# Patient Record
Sex: Male | Born: 1940 | Race: White | Hispanic: No | Marital: Single | Smoking: Never smoker
Health system: Southern US, Community
[De-identification: ages and names within clinical notes are randomized; demographics above are authoritative.]

## PROBLEM LIST (undated history)

## (undated) DIAGNOSIS — K631 Perforation of intestine (nontraumatic): Secondary | ICD-10-CM

## (undated) DIAGNOSIS — G500 Trigeminal neuralgia: Secondary | ICD-10-CM

## (undated) DIAGNOSIS — K259 Gastric ulcer, unspecified as acute or chronic, without hemorrhage or perforation: Secondary | ICD-10-CM

## (undated) DIAGNOSIS — R51 Headache: Secondary | ICD-10-CM

## (undated) DIAGNOSIS — I10 Essential (primary) hypertension: Secondary | ICD-10-CM

## (undated) DIAGNOSIS — C801 Malignant (primary) neoplasm, unspecified: Secondary | ICD-10-CM

## (undated) DIAGNOSIS — K810 Acute cholecystitis: Secondary | ICD-10-CM

## (undated) DIAGNOSIS — M1990 Unspecified osteoarthritis, unspecified site: Secondary | ICD-10-CM

## (undated) DIAGNOSIS — R519 Headache, unspecified: Secondary | ICD-10-CM

## (undated) DIAGNOSIS — I252 Old myocardial infarction: Secondary | ICD-10-CM

## (undated) DIAGNOSIS — E119 Type 2 diabetes mellitus without complications: Secondary | ICD-10-CM

## (undated) DIAGNOSIS — N183 Chronic kidney disease, stage 3 (moderate): Secondary | ICD-10-CM

## (undated) HISTORY — DX: Trigeminal neuralgia: G50.0

## (undated) HISTORY — DX: Perforation of intestine (nontraumatic): K63.1

## (undated) HISTORY — DX: Essential (primary) hypertension: I10

## (undated) HISTORY — DX: Chronic kidney disease, stage 3 (moderate): N18.3

## (undated) HISTORY — DX: Gastric ulcer, unspecified as acute or chronic, without hemorrhage or perforation: K25.9

## (undated) HISTORY — DX: Acute cholecystitis: K81.0

---

## 2006-08-08 ENCOUNTER — Ambulatory Visit: Payer: Self-pay | Admitting: Orthopaedic Surgery

## 2006-08-11 ENCOUNTER — Inpatient Hospital Stay: Payer: Self-pay | Admitting: Orthopaedic Surgery

## 2007-04-23 ENCOUNTER — Ambulatory Visit: Payer: Self-pay | Admitting: Orthopaedic Surgery

## 2007-04-30 ENCOUNTER — Ambulatory Visit: Payer: Self-pay | Admitting: Orthopaedic Surgery

## 2008-11-11 ENCOUNTER — Ambulatory Visit: Payer: Self-pay | Admitting: Otolaryngology

## 2009-01-11 ENCOUNTER — Ambulatory Visit: Payer: Self-pay | Admitting: Ophthalmology

## 2009-01-17 ENCOUNTER — Ambulatory Visit: Payer: Self-pay | Admitting: Ophthalmology

## 2009-03-21 ENCOUNTER — Ambulatory Visit: Payer: Self-pay | Admitting: Ophthalmology

## 2009-09-27 ENCOUNTER — Ambulatory Visit: Payer: Self-pay | Admitting: Internal Medicine

## 2010-01-31 ENCOUNTER — Observation Stay: Payer: Self-pay | Admitting: Internal Medicine

## 2010-02-07 ENCOUNTER — Encounter
Admission: RE | Admit: 2010-02-07 | Discharge: 2010-02-07 | Payer: Self-pay | Source: Home / Self Care | Attending: *Deleted | Admitting: *Deleted

## 2011-03-12 ENCOUNTER — Ambulatory Visit: Payer: Self-pay | Admitting: General Practice

## 2011-03-12 LAB — PROTIME-INR: INR: 1

## 2011-03-12 LAB — BASIC METABOLIC PANEL
Anion Gap: 10 (ref 7–16)
Calcium, Total: 8.6 mg/dL (ref 8.5–10.1)
Chloride: 104 mmol/L (ref 98–107)
Co2: 30 mmol/L (ref 21–32)
EGFR (African American): >60

## 2011-03-12 LAB — URINALYSIS, COMPLETE
Blood: NEGATIVE
Nitrite: NEGATIVE
Ph: 5 (ref 4.5–8.0)
Protein: NEGATIVE
Specific Gravity: 1.024 (ref 1.003–1.030)

## 2011-03-12 LAB — CBC
MCH: 29.5 pg (ref 26.0–34.0)
MCHC: 32.6 g/dL (ref 32.0–36.0)
Platelet: 193 10*3/uL (ref 150–440)

## 2011-03-12 LAB — MRSA PCR SCREENING

## 2011-03-12 LAB — APTT: Activated PTT: 34.2 secs (ref 23.6–35.9)

## 2011-03-12 LAB — SEDIMENTATION RATE: Erythrocyte Sed Rate: 11 mm/hr (ref 0–20)

## 2011-03-20 ENCOUNTER — Ambulatory Visit: Payer: Self-pay | Admitting: Cardiology

## 2011-03-25 ENCOUNTER — Inpatient Hospital Stay: Payer: Self-pay | Admitting: General Practice

## 2011-03-26 LAB — BASIC METABOLIC PANEL
Anion Gap: 11 (ref 7–16)
BUN: 17 mg/dL (ref 7–18)
Chloride: 105 mmol/L (ref 98–107)
EGFR (Non-African Amer.): 44 — ABNORMAL LOW
Glucose: 144 mg/dL — ABNORMAL HIGH (ref 65–99)
Osmolality: 282 (ref 275–301)

## 2011-03-26 LAB — HEMOGLOBIN: HGB: 11 g/dL — ABNORMAL LOW (ref 13.0–18.0)

## 2011-03-27 LAB — BASIC METABOLIC PANEL
Anion Gap: 10 (ref 7–16)
BUN: 24 mg/dL — ABNORMAL HIGH (ref 7–18)
Chloride: 103 mmol/L (ref 98–107)
Co2: 24 mmol/L (ref 21–32)
Creatinine: 1.75 mg/dL — ABNORMAL HIGH (ref 0.60–1.30)
Glucose: 116 mg/dL — ABNORMAL HIGH (ref 65–99)
Potassium: 4.2 mmol/L (ref 3.5–5.1)
Sodium: 137 mmol/L (ref 136–145)

## 2011-03-27 LAB — PLATELET COUNT: Platelet: 148 10*3/uL — ABNORMAL LOW (ref 150–440)

## 2011-04-15 ENCOUNTER — Emergency Department: Payer: Self-pay | Admitting: Emergency Medicine

## 2011-04-15 LAB — COMPREHENSIVE METABOLIC PANEL
Anion Gap: 16 (ref 7–16)
Calcium, Total: 9 mg/dL (ref 8.5–10.1)
Creatinine: 1.91 mg/dL — ABNORMAL HIGH (ref 0.60–1.30)
EGFR (African American): 45 — ABNORMAL LOW
EGFR (Non-African Amer.): 37 — ABNORMAL LOW
Glucose: 131 mg/dL — ABNORMAL HIGH (ref 65–99)
Potassium: 4.4 mmol/L (ref 3.5–5.1)
SGOT(AST): 19 U/L (ref 15–37)
SGPT (ALT): 18 U/L
Sodium: 137 mmol/L (ref 136–145)
Total Protein: 7.7 g/dL (ref 6.4–8.2)

## 2011-04-15 LAB — URINALYSIS, COMPLETE
Bilirubin,UR: NEGATIVE
Glucose,UR: NEGATIVE mg/dL (ref 0–75)
Leukocyte Esterase: NEGATIVE
Nitrite: NEGATIVE
Squamous Epithelial: NONE SEEN

## 2011-04-15 LAB — CBC
MCV: 89 fL (ref 80–100)
Platelet: 288 10*3/uL (ref 150–440)
RBC: 4.14 10*6/uL — ABNORMAL LOW (ref 4.40–5.90)
RDW: 14 % (ref 11.5–14.5)
WBC: 9.6 10*3/uL (ref 3.8–10.6)

## 2011-04-15 LAB — TROPONIN I: Troponin-I: 0.03 ng/mL

## 2011-04-23 ENCOUNTER — Emergency Department: Payer: Self-pay | Admitting: Emergency Medicine

## 2011-04-23 LAB — CBC
HCT: 36.7 % — ABNORMAL LOW (ref 40.0–52.0)
HGB: 12.1 g/dL — ABNORMAL LOW (ref 13.0–18.0)
MCHC: 33 g/dL (ref 32.0–36.0)
WBC: 8.4 10*3/uL (ref 3.8–10.6)

## 2011-04-23 LAB — COMPREHENSIVE METABOLIC PANEL
Anion Gap: 10 (ref 7–16)
Bilirubin,Total: 1.1 mg/dL — ABNORMAL HIGH (ref 0.2–1.0)
Co2: 26 mmol/L (ref 21–32)
EGFR (African American): 44 — ABNORMAL LOW
EGFR (Non-African Amer.): 37 — ABNORMAL LOW
Glucose: 126 mg/dL — ABNORMAL HIGH (ref 65–99)
Potassium: 4.2 mmol/L (ref 3.5–5.1)
SGOT(AST): 22 U/L (ref 15–37)
SGPT (ALT): 27 U/L
Total Protein: 7.5 g/dL (ref 6.4–8.2)

## 2011-05-11 ENCOUNTER — Ambulatory Visit: Payer: Self-pay | Admitting: General Practice

## 2011-11-06 ENCOUNTER — Ambulatory Visit: Payer: Self-pay | Admitting: Neurology

## 2013-07-30 DIAGNOSIS — B351 Tinea unguium: Secondary | ICD-10-CM | POA: Insufficient documentation

## 2014-02-15 DIAGNOSIS — N1831 Chronic kidney disease, stage 3a: Secondary | ICD-10-CM | POA: Insufficient documentation

## 2014-02-15 DIAGNOSIS — N183 Chronic kidney disease, stage 3 unspecified: Secondary | ICD-10-CM | POA: Insufficient documentation

## 2014-02-15 HISTORY — DX: Chronic kidney disease, stage 3 unspecified: N18.30

## 2014-05-18 ENCOUNTER — Inpatient Hospital Stay
Admit: 2014-05-18 | Disposition: A | Discharge status: Skilled Nursing Facility | Payer: Self-pay | Attending: Internal Medicine | Admitting: Internal Medicine

## 2014-05-19 LAB — CBC WITH DIFFERENTIAL/PLATELET
BASOS PCT: 0.1 %
BASOS PCT: 0.2 %
Basophil #: 0 10*3/uL (ref 0.0–0.1)
Basophil #: 0 10*3/uL (ref 0.0–0.1)
EOS ABS: 0 10*3/uL (ref 0.0–0.7)
EOS PCT: 0 %
EOS PCT: 0 %
Eosinophil #: 0 10*3/uL (ref 0.0–0.7)
HCT: 37.7 % — ABNORMAL LOW (ref 40.0–52.0)
HCT: 38.2 % — AB (ref 40.0–52.0)
HGB: 12 g/dL — AB (ref 13.0–18.0)
HGB: 12.7 g/dL — AB (ref 13.0–18.0)
LYMPHS PCT: 4.2 %
LYMPHS PCT: 1.3 %
Lymphocyte #: 0.8 10*3/uL — ABNORMAL LOW (ref 1.0–3.6)
Lymphocyte #: 0.3 10*3/uL — ABNORMAL LOW (ref 1.0–3.6)
MCH: 30.7 pg (ref 26.0–34.0)
MCH: 31.6 pg (ref 26.0–34.0)
MCHC: 32 g/dL (ref 32.0–36.0)
MCHC: 33.3 g/dL (ref 32.0–36.0)
MCV: 96 fL (ref 80–100)
MCV: 95 fL (ref 80–100)
MONO ABS: 1 x10 3/mm (ref 0.2–1.0)
MONOS PCT: 5.4 %
Monocyte #: 0.7 x10 3/mm (ref 0.2–1.0)
Monocyte %: 3 %
NEUTROS ABS: 22.4 10*3/uL — AB (ref 1.4–6.5)
NEUTROS PCT: 90.3 %
NEUTROS PCT: 95.5 %
Neutrophil #: 16.4 10*3/uL — ABNORMAL HIGH (ref 1.4–6.5)
Platelet: 194 10*3/uL (ref 150–440)
Platelet: 187 10*3/uL (ref 150–440)
RBC: 3.92 10*6/uL — AB (ref 4.40–5.90)
RBC: 4.02 10*6/uL — AB (ref 4.40–5.90)
RDW: 12.8 % (ref 11.5–14.5)
RDW: 13 % (ref 11.5–14.5)
WBC: 18.2 10*3/uL — AB (ref 3.8–10.6)
WBC: 23.5 10*3/uL — ABNORMAL HIGH (ref 3.8–10.6)

## 2014-05-19 LAB — PROTIME-INR
INR: 1.5
PROTHROMBIN TIME: 18 s — AB

## 2014-05-19 LAB — COMPREHENSIVE METABOLIC PANEL
ALBUMIN: 2.7 g/dL — AB
ALK PHOS: 61 U/L
ALT: 12 U/L — AB
AST: 20 U/L
Anion Gap: 5 — ABNORMAL LOW (ref 7–16)
BUN: 17 mg/dL
Bilirubin,Total: 1.6 mg/dL — ABNORMAL HIGH
CREATININE: 1.52 mg/dL — AB
Calcium, Total: 7.8 mg/dL — ABNORMAL LOW
Chloride: 108 mmol/L
Co2: 24 mmol/L
EGFR (African American): 51 — ABNORMAL LOW
GFR CALC NON AF AMER: 44 — AB
GLUCOSE: 154 mg/dL — AB
Potassium: 4.5 mmol/L
SODIUM: 137 mmol/L
TOTAL PROTEIN: 5.8 g/dL — AB

## 2014-05-19 LAB — LIPASE, BLOOD: LIPASE: 17 U/L — AB

## 2014-05-20 LAB — HEPATIC FUNCTION PANEL A (ARMC)
ALK PHOS: 76 U/L
ALT: 13 U/L — AB
AST: 17 U/L
Albumin: 2.7 g/dL — ABNORMAL LOW
BILIRUBIN TOTAL: 1.3 mg/dL — AB
Bilirubin, Direct: 0.5 mg/dL
Indirect Bilirubin: 0.8
Total Protein: 6 g/dL — ABNORMAL LOW

## 2014-05-20 LAB — PROTIME-INR
INR: 1.5
PROTHROMBIN TIME: 18.7 s — AB

## 2014-05-20 LAB — WBC: WBC: 23.3 10*3/uL — ABNORMAL HIGH (ref 3.8–10.6)

## 2014-05-21 DIAGNOSIS — K810 Acute cholecystitis: Secondary | ICD-10-CM

## 2014-05-21 HISTORY — PX: LAPAROSCOPIC CHOLECYSTECTOMY: SUR755

## 2014-05-21 HISTORY — DX: Acute cholecystitis: K81.0

## 2014-05-21 LAB — CBC WITH DIFFERENTIAL/PLATELET
Basophil #: 0 10*3/uL (ref 0.0–0.1)
Basophil #: 0 10*3/uL (ref 0.0–0.1)
Basophil %: 0.1 %
Basophil %: 0.2 %
EOS PCT: 0 %
EOS PCT: 0 %
Eosinophil #: 0 10*3/uL (ref 0.0–0.7)
Eosinophil #: 0 10*3/uL (ref 0.0–0.7)
HCT: 34.7 % — ABNORMAL LOW (ref 40.0–52.0)
HCT: 32.4 % — AB (ref 40.0–52.0)
HGB: 11.5 g/dL — ABNORMAL LOW (ref 13.0–18.0)
HGB: 10.4 g/dL — ABNORMAL LOW (ref 13.0–18.0)
LYMPHS ABS: 0.2 10*3/uL — AB (ref 1.0–3.6)
LYMPHS PCT: 2.6 %
Lymphocyte #: 0.5 10*3/uL — ABNORMAL LOW (ref 1.0–3.6)
Lymphocyte %: 1.3 %
MCH: 31.9 pg (ref 26.0–34.0)
MCH: 31 pg (ref 26.0–34.0)
MCHC: 33.2 g/dL (ref 32.0–36.0)
MCHC: 32.2 g/dL (ref 32.0–36.0)
MCV: 96 fL (ref 80–100)
MCV: 96 fL (ref 80–100)
MONOS PCT: 2.8 %
MONOS PCT: 3.4 %
Monocyte #: 0.5 x10 3/mm (ref 0.2–1.0)
Monocyte #: 0.5 x10 3/mm (ref 0.2–1.0)
NEUTROS ABS: 13.7 10*3/uL — AB (ref 1.4–6.5)
NEUTROS PCT: 94.5 %
Neutrophil #: 17.5 10*3/uL — ABNORMAL HIGH (ref 1.4–6.5)
Neutrophil %: 95.1 %
PLATELETS: 175 10*3/uL (ref 150–440)
Platelet: 178 10*3/uL (ref 150–440)
RBC: 3.61 10*6/uL — AB (ref 4.40–5.90)
RBC: 3.36 10*6/uL — AB (ref 4.40–5.90)
RDW: 13.6 % (ref 11.5–14.5)
RDW: 13.8 % (ref 11.5–14.5)
WBC: 18.6 10*3/uL — AB (ref 3.8–10.6)
WBC: 14.4 10*3/uL — AB (ref 3.8–10.6)

## 2014-05-21 LAB — BASIC METABOLIC PANEL
Anion Gap: 4 — ABNORMAL LOW (ref 7–16)
Anion Gap: 6 — ABNORMAL LOW (ref 7–16)
BUN: 27 mg/dL — AB
BUN: 29 mg/dL — AB
CHLORIDE: 111 mmol/L
CO2: 23 mmol/L
CO2: 22 mmol/L
CREATININE: 1.69 mg/dL — AB
CREATININE: 1.76 mg/dL — AB
Calcium, Total: 7.7 mg/dL — ABNORMAL LOW
Calcium, Total: 7.5 mg/dL — ABNORMAL LOW
Chloride: 111 mmol/L
EGFR (African American): 43 — ABNORMAL LOW
EGFR (Non-African Amer.): 37 — ABNORMAL LOW
GFR CALC AF AMER: 45 — AB
GFR CALC NON AF AMER: 39 — AB
Glucose: 141 mg/dL — ABNORMAL HIGH
Glucose: 180 mg/dL — ABNORMAL HIGH
POTASSIUM: 4.2 mmol/L
POTASSIUM: 4.5 mmol/L
SODIUM: 139 mmol/L
Sodium: 138 mmol/L

## 2014-05-21 LAB — PROTIME-INR
INR: 1.4
INR: 1.3
PROTHROMBIN TIME: 16.7 s — AB
Prothrombin Time: 16.9 secs — ABNORMAL HIGH

## 2014-05-22 LAB — BASIC METABOLIC PANEL
ANION GAP: 8 (ref 7–16)
BUN: 29 mg/dL — ABNORMAL HIGH
Calcium, Total: 7.4 mg/dL — ABNORMAL LOW
Chloride: 110 mmol/L
Co2: 22 mmol/L
Creatinine: 1.59 mg/dL — ABNORMAL HIGH
EGFR (African American): 49 — ABNORMAL LOW
EGFR (Non-African Amer.): 42 — ABNORMAL LOW
Glucose: 220 mg/dL — ABNORMAL HIGH
Potassium: 3.2 mmol/L — ABNORMAL LOW
Sodium: 140 mmol/L

## 2014-05-22 LAB — CBC WITH DIFFERENTIAL/PLATELET
BASOS PCT: 0.2 %
Basophil #: 0 10*3/uL (ref 0.0–0.1)
EOS ABS: 0 10*3/uL (ref 0.0–0.7)
EOS PCT: 0 %
HCT: 31.1 % — AB (ref 40.0–52.0)
HGB: 10 g/dL — ABNORMAL LOW (ref 13.0–18.0)
Lymphocyte #: 0.3 10*3/uL — ABNORMAL LOW (ref 1.0–3.6)
Lymphocyte %: 2.6 %
MCH: 30.7 pg (ref 26.0–34.0)
MCHC: 32 g/dL (ref 32.0–36.0)
MCV: 96 fL (ref 80–100)
MONOS PCT: 4.5 %
Monocyte #: 0.6 x10 3/mm (ref 0.2–1.0)
NEUTROS ABS: 11.7 10*3/uL — AB (ref 1.4–6.5)
NEUTROS PCT: 92.7 %
Platelet: 168 10*3/uL (ref 150–440)
RBC: 3.24 10*6/uL — AB (ref 4.40–5.90)
RDW: 13.5 % (ref 11.5–14.5)
WBC: 12.7 10*3/uL — AB (ref 3.8–10.6)

## 2014-05-22 LAB — POTASSIUM: Potassium: 3.4 mmol/L — ABNORMAL LOW

## 2014-05-22 LAB — MAGNESIUM
MAGNESIUM: 1.4 mg/dL — AB
Magnesium: 1.9 mg/dL

## 2014-05-22 LAB — TROPONIN I: TROPONIN-I: 0.05 ng/mL — AB

## 2014-05-23 LAB — BASIC METABOLIC PANEL
Anion Gap: 4 — ABNORMAL LOW (ref 7–16)
BUN: 23 mg/dL — AB
CHLORIDE: 112 mmol/L — AB
CREATININE: 1.34 mg/dL — AB
Calcium, Total: 7.2 mg/dL — ABNORMAL LOW
Co2: 23 mmol/L
EGFR (African American): 60 — ABNORMAL LOW
EGFR (Non-African Amer.): 51 — ABNORMAL LOW
Glucose: 196 mg/dL — ABNORMAL HIGH
POTASSIUM: 3.8 mmol/L
Sodium: 139 mmol/L

## 2014-05-23 LAB — MAGNESIUM: Magnesium: 1.8 mg/dL

## 2014-05-23 LAB — CBC WITH DIFFERENTIAL/PLATELET
BASOS PCT: 0.3 %
Basophil #: 0 10*3/uL (ref 0.0–0.1)
Eosinophil #: 0.1 10*3/uL (ref 0.0–0.7)
Eosinophil %: 0.8 %
HCT: 30.9 % — AB (ref 40.0–52.0)
HGB: 10.2 g/dL — AB (ref 13.0–18.0)
LYMPHS PCT: 3.2 %
Lymphocyte #: 0.5 10*3/uL — ABNORMAL LOW (ref 1.0–3.6)
MCH: 31.2 pg (ref 26.0–34.0)
MCHC: 32.9 g/dL (ref 32.0–36.0)
MCV: 95 fL (ref 80–100)
Monocyte #: 0.7 x10 3/mm (ref 0.2–1.0)
Monocyte %: 4.9 %
NEUTROS ABS: 13.3 10*3/uL — AB (ref 1.4–6.5)
Neutrophil %: 90.8 %
Platelet: 178 10*3/uL (ref 150–440)
RBC: 3.26 10*6/uL — ABNORMAL LOW (ref 4.40–5.90)
RDW: 13.5 % (ref 11.5–14.5)
WBC: 14.6 10*3/uL — ABNORMAL HIGH (ref 3.8–10.6)

## 2014-05-23 LAB — T4, FREE: Free Thyroxine: 0.92 ng/dL

## 2014-05-23 LAB — PHOSPHORUS: Phosphorus: 1.9 mg/dL — ABNORMAL LOW

## 2014-05-23 LAB — TRIGLYCERIDES: TRIGLYCERIDES: 255 mg/dL — AB

## 2014-05-24 LAB — CBC WITH DIFFERENTIAL/PLATELET
BASOS ABS: 0 10*3/uL (ref 0.0–0.1)
Basophil %: 0.2 %
Eosinophil #: 0.1 10*3/uL (ref 0.0–0.7)
Eosinophil %: 0.4 %
HCT: 31.3 % — AB (ref 40.0–52.0)
HGB: 10.6 g/dL — ABNORMAL LOW (ref 13.0–18.0)
LYMPHS PCT: 3 %
Lymphocyte #: 0.5 10*3/uL — ABNORMAL LOW (ref 1.0–3.6)
MCH: 31.7 pg (ref 26.0–34.0)
MCHC: 33.7 g/dL (ref 32.0–36.0)
MCV: 94 fL (ref 80–100)
Monocyte #: 0.9 x10 3/mm (ref 0.2–1.0)
Monocyte %: 5.2 %
NEUTROS ABS: 15.4 10*3/uL — AB (ref 1.4–6.5)
Neutrophil %: 91.2 %
PLATELETS: 212 10*3/uL (ref 150–440)
RBC: 3.33 10*6/uL — ABNORMAL LOW (ref 4.40–5.90)
RDW: 13.8 % (ref 11.5–14.5)
WBC: 16.8 10*3/uL — AB (ref 3.8–10.6)

## 2014-05-24 LAB — HEPATIC FUNCTION PANEL A (ARMC)
ALT: 29 U/L
Albumin: 1.8 g/dL — ABNORMAL LOW
Alkaline Phosphatase: 79 U/L
BILIRUBIN TOTAL: 1 mg/dL
Bilirubin, Direct: 0.6 mg/dL — ABNORMAL HIGH
Indirect Bilirubin: 0.4
SGOT(AST): 23 U/L
Total Protein: 5.7 g/dL — ABNORMAL LOW

## 2014-05-24 LAB — BASIC METABOLIC PANEL
Anion Gap: 9 (ref 7–16)
BUN: 20 mg/dL
CREATININE: 1.21 mg/dL
Calcium, Total: 7.7 mg/dL — ABNORMAL LOW
Chloride: 111 mmol/L
Co2: 21 mmol/L — ABNORMAL LOW
EGFR (African American): >60
EGFR (Non-African Amer.): 58 — ABNORMAL LOW
Glucose: 171 mg/dL — ABNORMAL HIGH
POTASSIUM: 4.4 mmol/L
SODIUM: 141 mmol/L

## 2014-05-24 LAB — PHOSPHORUS: Phosphorus: 3.3 mg/dL

## 2014-05-24 LAB — AMMONIA: Ammonia, Plasma: 14 umol/L

## 2014-05-24 LAB — MAGNESIUM: Magnesium: 1.8 mg/dL

## 2014-05-24 LAB — LACTIC ACID, PLASMA: LACTIC ACID, VENOUS: 1.4 mmol/L

## 2014-05-25 LAB — CBC WITH DIFFERENTIAL/PLATELET
BASOS ABS: 0 10*3/uL (ref 0.0–0.1)
Basophil %: 0.2 %
EOS ABS: 0.1 10*3/uL (ref 0.0–0.7)
Eosinophil %: 0.6 %
HCT: 32.1 % — AB (ref 40.0–52.0)
HGB: 10.5 g/dL — AB (ref 13.0–18.0)
LYMPHS ABS: 0.5 10*3/uL — AB (ref 1.0–3.6)
LYMPHS PCT: 3.4 %
MCH: 30.8 pg (ref 26.0–34.0)
MCHC: 32.7 g/dL (ref 32.0–36.0)
MCV: 94 fL (ref 80–100)
MONO ABS: 0.9 x10 3/mm (ref 0.2–1.0)
MONOS PCT: 6.2 %
Neutrophil #: 13.2 10*3/uL — ABNORMAL HIGH (ref 1.4–6.5)
Neutrophil %: 89.6 %
Platelet: 222 10*3/uL (ref 150–440)
RBC: 3.4 10*6/uL — ABNORMAL LOW (ref 4.40–5.90)
RDW: 13.7 % (ref 11.5–14.5)
WBC: 14.7 10*3/uL — ABNORMAL HIGH (ref 3.8–10.6)

## 2014-05-26 LAB — CBC WITH DIFFERENTIAL/PLATELET
Basophil #: 0 10*3/uL (ref 0.0–0.1)
Basophil %: 0.2 %
EOS ABS: 0.1 10*3/uL (ref 0.0–0.7)
Eosinophil %: 0.7 %
HCT: 31.4 % — ABNORMAL LOW (ref 40.0–52.0)
HGB: 10.1 g/dL — ABNORMAL LOW (ref 13.0–18.0)
LYMPHS ABS: 0.6 10*3/uL — AB (ref 1.0–3.6)
Lymphocyte %: 4.7 %
MCH: 30.4 pg (ref 26.0–34.0)
MCHC: 32.1 g/dL (ref 32.0–36.0)
MCV: 95 fL (ref 80–100)
MONO ABS: 0.9 x10 3/mm (ref 0.2–1.0)
Monocyte %: 6.5 %
NEUTROS PCT: 87.9 %
Neutrophil #: 11.8 10*3/uL — ABNORMAL HIGH (ref 1.4–6.5)
PLATELETS: 221 10*3/uL (ref 150–440)
RBC: 3.31 10*6/uL — ABNORMAL LOW (ref 4.40–5.90)
RDW: 14.1 % (ref 11.5–14.5)
WBC: 13.5 10*3/uL — ABNORMAL HIGH (ref 3.8–10.6)

## 2014-05-27 LAB — BASIC METABOLIC PANEL
ANION GAP: 8 (ref 7–16)
BUN: 26 mg/dL — ABNORMAL HIGH
CALCIUM: 7.9 mg/dL — AB
CO2: 27 mmol/L
Chloride: 107 mmol/L
Creatinine: 1.07 mg/dL
EGFR (African American): >60
EGFR (Non-African Amer.): >60
Glucose: 318 mg/dL — ABNORMAL HIGH
Potassium: 4.1 mmol/L
Sodium: 142 mmol/L

## 2014-05-27 LAB — MAGNESIUM: Magnesium: 1.7 mg/dL

## 2014-05-27 LAB — CBC WITH DIFFERENTIAL/PLATELET
BASOS ABS: 0 10*3/uL (ref 0.0–0.1)
Basophil %: 0.1 %
EOS ABS: 0.2 10*3/uL (ref 0.0–0.7)
Eosinophil %: 1.1 %
HCT: 30.3 % — ABNORMAL LOW (ref 40.0–52.0)
HGB: 9.8 g/dL — ABNORMAL LOW (ref 13.0–18.0)
LYMPHS ABS: 0.6 10*3/uL — AB (ref 1.0–3.6)
Lymphocyte %: 4.2 %
MCH: 30.8 pg (ref 26.0–34.0)
MCHC: 32.4 g/dL (ref 32.0–36.0)
MCV: 95 fL (ref 80–100)
Monocyte #: 0.8 x10 3/mm (ref 0.2–1.0)
Monocyte %: 5.8 %
NEUTROS PCT: 88.8 %
Neutrophil #: 12.4 10*3/uL — ABNORMAL HIGH (ref 1.4–6.5)
Platelet: 220 10*3/uL (ref 150–440)
RBC: 3.19 10*6/uL — ABNORMAL LOW (ref 4.40–5.90)
RDW: 14.3 % (ref 11.5–14.5)
WBC: 14 10*3/uL — ABNORMAL HIGH (ref 3.8–10.6)

## 2014-05-27 LAB — PHOSPHORUS: Phosphorus: 2.6 mg/dL

## 2014-05-28 DIAGNOSIS — K631 Perforation of intestine (nontraumatic): Secondary | ICD-10-CM

## 2014-05-28 HISTORY — PX: EXPLORATORY LAPAROTOMY: SUR591

## 2014-05-28 HISTORY — DX: Perforation of intestine (nontraumatic): K63.1

## 2014-05-28 HISTORY — PX: COLOSTOMY: SHX63

## 2014-05-28 LAB — BASIC METABOLIC PANEL
Anion Gap: 2 — ABNORMAL LOW (ref 7–16)
BUN: 28 mg/dL — ABNORMAL HIGH
CHLORIDE: 109 mmol/L
Calcium, Total: 7.6 mg/dL — ABNORMAL LOW
Co2: 28 mmol/L
Creatinine: 1.11 mg/dL
Glucose: 353 mg/dL — ABNORMAL HIGH
Potassium: 3.8 mmol/L
Sodium: 139 mmol/L

## 2014-05-28 LAB — CBC WITH DIFFERENTIAL/PLATELET
BASOS PCT: 0.2 %
Basophil #: 0 10*3/uL (ref 0.0–0.1)
Eosinophil #: 0.2 10*3/uL (ref 0.0–0.7)
Eosinophil %: 1.4 %
HCT: 28.5 % — ABNORMAL LOW (ref 40.0–52.0)
HGB: 9.3 g/dL — ABNORMAL LOW (ref 13.0–18.0)
Lymphocyte #: 0.7 10*3/uL — ABNORMAL LOW (ref 1.0–3.6)
Lymphocyte %: 4.5 %
MCH: 31.1 pg (ref 26.0–34.0)
MCHC: 32.7 g/dL (ref 32.0–36.0)
MCV: 95 fL (ref 80–100)
MONOS PCT: 5.6 %
Monocyte #: 0.8 x10 3/mm (ref 0.2–1.0)
NEUTROS ABS: 13.4 10*3/uL — AB (ref 1.4–6.5)
Neutrophil %: 88.3 %
Platelet: 210 10*3/uL (ref 150–440)
RBC: 3 10*6/uL — ABNORMAL LOW (ref 4.40–5.90)
RDW: 14.4 % (ref 11.5–14.5)
WBC: 15.2 10*3/uL — AB (ref 3.8–10.6)

## 2014-05-28 LAB — PHOSPHORUS: PHOSPHORUS: 2.4 mg/dL — AB

## 2014-05-28 LAB — MAGNESIUM: MAGNESIUM: 1.6 mg/dL — AB

## 2014-05-29 LAB — CBC WITH DIFFERENTIAL/PLATELET
BASOS ABS: 0 10*3/uL (ref 0.0–0.1)
Basophil %: 0.1 %
EOS ABS: 0 10*3/uL (ref 0.0–0.7)
Eosinophil %: 0 %
HCT: 30.7 % — AB (ref 40.0–52.0)
HGB: 9.9 g/dL — ABNORMAL LOW (ref 13.0–18.0)
LYMPHS ABS: 0.6 10*3/uL — AB (ref 1.0–3.6)
Lymphocyte %: 2.9 %
MCH: 31 pg (ref 26.0–34.0)
MCHC: 32.4 g/dL (ref 32.0–36.0)
MCV: 96 fL (ref 80–100)
Monocyte #: 0.6 x10 3/mm (ref 0.2–1.0)
Monocyte %: 2.7 %
NEUTROS ABS: 21 10*3/uL — AB (ref 1.4–6.5)
Neutrophil %: 94.3 %
Platelet: 221 10*3/uL (ref 150–440)
RBC: 3.21 10*6/uL — ABNORMAL LOW (ref 4.40–5.90)
RDW: 14.6 % — ABNORMAL HIGH (ref 11.5–14.5)
WBC: 22.3 10*3/uL — ABNORMAL HIGH (ref 3.8–10.6)

## 2014-05-29 LAB — BASIC METABOLIC PANEL
Anion Gap: 2 — ABNORMAL LOW (ref 7–16)
BUN: 31 mg/dL — ABNORMAL HIGH
CALCIUM: 6.8 mg/dL — AB
CO2: 24 mmol/L
Chloride: 108 mmol/L
Creatinine: 1.17 mg/dL
EGFR (African American): >60
Glucose: 504 mg/dL
Potassium: 4.1 mmol/L
Sodium: 134 mmol/L — ABNORMAL LOW

## 2014-05-29 LAB — PHOSPHORUS: Phosphorus: 2.8 mg/dL

## 2014-05-29 LAB — ALBUMIN: Albumin: 1.2 g/dL — ABNORMAL LOW

## 2014-05-29 LAB — CULTURE, BLOOD (SINGLE)

## 2014-05-29 LAB — MAGNESIUM: Magnesium: 1.6 mg/dL — ABNORMAL LOW

## 2014-05-30 LAB — MAGNESIUM: Magnesium: 1.7 mg/dL

## 2014-05-30 LAB — CBC WITH DIFFERENTIAL/PLATELET
BASOS PCT: 0.3 %
Basophil #: 0.1 10*3/uL (ref 0.0–0.1)
EOS PCT: 0.3 %
Eosinophil #: 0.1 10*3/uL (ref 0.0–0.7)
HCT: 29.8 % — ABNORMAL LOW (ref 40.0–52.0)
HGB: 9.5 g/dL — AB (ref 13.0–18.0)
Lymphocyte #: 0.8 10*3/uL — ABNORMAL LOW (ref 1.0–3.6)
Lymphocyte %: 2.2 %
MCH: 30.3 pg (ref 26.0–34.0)
MCHC: 32 g/dL (ref 32.0–36.0)
MCV: 95 fL (ref 80–100)
MONO ABS: 1.1 x10 3/mm — AB (ref 0.2–1.0)
Monocyte %: 2.9 %
NEUTROS ABS: 34.5 10*3/uL — AB (ref 1.4–6.5)
Neutrophil %: 94.3 %
PLATELETS: 348 10*3/uL (ref 150–440)
RBC: 3.15 10*6/uL — ABNORMAL LOW (ref 4.40–5.90)
RDW: 14.6 % — ABNORMAL HIGH (ref 11.5–14.5)
WBC: 36.6 10*3/uL — ABNORMAL HIGH (ref 3.8–10.6)

## 2014-05-30 LAB — BASIC METABOLIC PANEL
ANION GAP: 6 — AB (ref 7–16)
BUN: 36 mg/dL — ABNORMAL HIGH
CALCIUM: 6.9 mg/dL — AB
Chloride: 105 mmol/L
Co2: 25 mmol/L
Creatinine: 1.47 mg/dL — ABNORMAL HIGH
EGFR (Non-African Amer.): 46 — ABNORMAL LOW
GFR CALC AF AMER: 53 — AB
GLUCOSE: 229 mg/dL — AB
POTASSIUM: 3.9 mmol/L
Sodium: 136 mmol/L

## 2014-05-30 LAB — PHOSPHORUS: PHOSPHORUS: 3.6 mg/dL

## 2014-05-31 LAB — CBC WITH DIFFERENTIAL/PLATELET
BASOS ABS: 0 10*3/uL (ref 0.0–0.1)
Basophil %: 0.1 %
EOS ABS: 0.2 10*3/uL (ref 0.0–0.7)
Eosinophil %: 1 %
HCT: 25.6 % — AB (ref 40.0–52.0)
HGB: 8.3 g/dL — ABNORMAL LOW (ref 13.0–18.0)
LYMPHS PCT: 5 %
Lymphocyte #: 1.1 10*3/uL (ref 1.0–3.6)
MCH: 30.4 pg (ref 26.0–34.0)
MCHC: 32.3 g/dL (ref 32.0–36.0)
MCV: 94 fL (ref 80–100)
MONOS PCT: 4.9 %
Monocyte #: 1.1 x10 3/mm — ABNORMAL HIGH (ref 0.2–1.0)
NEUTROS ABS: 20.1 10*3/uL — AB (ref 1.4–6.5)
NEUTROS PCT: 89 %
Platelet: 305 10*3/uL (ref 150–440)
RBC: 2.72 10*6/uL — ABNORMAL LOW (ref 4.40–5.90)
RDW: 14.1 % (ref 11.5–14.5)
WBC: 22.6 10*3/uL — AB (ref 3.8–10.6)

## 2014-05-31 LAB — BASIC METABOLIC PANEL
Anion Gap: 4 — ABNORMAL LOW (ref 7–16)
BUN: 36 mg/dL — ABNORMAL HIGH
CALCIUM: 6.6 mg/dL — AB
Chloride: 103 mmol/L
Co2: 27 mmol/L
Creatinine: 1.55 mg/dL — ABNORMAL HIGH
EGFR (African American): 50 — ABNORMAL LOW
GFR CALC NON AF AMER: 43 — AB
GLUCOSE: 170 mg/dL — AB
Potassium: 3.7 mmol/L
SODIUM: 134 mmol/L — AB

## 2014-05-31 LAB — PHOSPHORUS: Phosphorus: 3.7 mg/dL

## 2014-05-31 LAB — MAGNESIUM: Magnesium: 1.7 mg/dL

## 2014-06-01 LAB — BASIC METABOLIC PANEL
Anion Gap: 7 (ref 7–16)
BUN: 35 mg/dL — ABNORMAL HIGH
CREATININE: 1.37 mg/dL — AB
Calcium, Total: 7.1 mg/dL — ABNORMAL LOW
Chloride: 105 mmol/L
Co2: 28 mmol/L
GFR CALC AF AMER: 58 — AB
GFR CALC NON AF AMER: 50 — AB
GLUCOSE: 145 mg/dL — AB
Potassium: 3.8 mmol/L
Sodium: 140 mmol/L

## 2014-06-01 LAB — MAGNESIUM: MAGNESIUM: 1.7 mg/dL

## 2014-06-01 LAB — PHOSPHORUS: PHOSPHORUS: 3.1 mg/dL

## 2014-06-02 LAB — BASIC METABOLIC PANEL
Anion Gap: 6 — ABNORMAL LOW (ref 7–16)
BUN: 30 mg/dL — ABNORMAL HIGH
CALCIUM: 7.1 mg/dL — AB
Chloride: 101 mmol/L
Co2: 30 mmol/L
Creatinine: 1.18 mg/dL
EGFR (African American): >60
EGFR (Non-African Amer.): >60
GLUCOSE: 330 mg/dL — AB
Potassium: 3.8 mmol/L
Sodium: 137 mmol/L

## 2014-06-02 LAB — CULTURE, BLOOD (SINGLE)

## 2014-06-02 LAB — CBC WITH DIFFERENTIAL/PLATELET
Basophil #: 0 10*3/uL (ref 0.0–0.1)
Basophil %: 0.3 %
EOS ABS: 0.1 10*3/uL (ref 0.0–0.7)
Eosinophil %: 0.9 %
HCT: 25.4 % — ABNORMAL LOW (ref 40.0–52.0)
HGB: 8.2 g/dL — ABNORMAL LOW (ref 13.0–18.0)
Lymphocyte #: 0.5 10*3/uL — ABNORMAL LOW (ref 1.0–3.6)
Lymphocyte %: 3.9 %
MCH: 30.2 pg (ref 26.0–34.0)
MCHC: 32.1 g/dL (ref 32.0–36.0)
MCV: 94 fL (ref 80–100)
MONOS PCT: 7.7 %
Monocyte #: 1 x10 3/mm (ref 0.2–1.0)
Neutrophil #: 11.4 10*3/uL — ABNORMAL HIGH (ref 1.4–6.5)
Neutrophil %: 87.2 %
PLATELETS: 335 10*3/uL (ref 150–440)
RBC: 2.7 10*6/uL — ABNORMAL LOW (ref 4.40–5.90)
RDW: 13.8 % (ref 11.5–14.5)
WBC: 13.1 10*3/uL — AB (ref 3.8–10.6)

## 2014-06-02 LAB — PHOSPHORUS: PHOSPHORUS: 3 mg/dL

## 2014-06-02 LAB — MAGNESIUM: Magnesium: 1.7 mg/dL

## 2014-06-03 LAB — BASIC METABOLIC PANEL
Anion Gap: 3 — ABNORMAL LOW (ref 7–16)
BUN: 27 mg/dL — ABNORMAL HIGH
CHLORIDE: 103 mmol/L
Calcium, Total: 7.2 mg/dL — ABNORMAL LOW
Co2: 32 mmol/L
Creatinine: 1.18 mg/dL
Glucose: 232 mg/dL — ABNORMAL HIGH
Potassium: 4 mmol/L
Sodium: 138 mmol/L

## 2014-06-03 LAB — PHOSPHORUS: Phosphorus: 3.5 mg/dL

## 2014-06-03 LAB — MAGNESIUM: Magnesium: 1.7 mg/dL

## 2014-06-04 LAB — BASIC METABOLIC PANEL
ANION GAP: 3 — AB (ref 7–16)
BUN: 28 mg/dL — AB
CALCIUM: 7 mg/dL — AB
Chloride: 101 mmol/L
Co2: 31 mmol/L
Creatinine: 1.05 mg/dL
EGFR (African American): >60
EGFR (Non-African Amer.): >60
Glucose: 282 mg/dL — ABNORMAL HIGH
POTASSIUM: 3.6 mmol/L
Sodium: 135 mmol/L

## 2014-06-04 LAB — PHOSPHORUS: Phosphorus: 3.3 mg/dL

## 2014-06-04 LAB — ALBUMIN: Albumin: 1.2 g/dL — ABNORMAL LOW

## 2014-06-04 LAB — MAGNESIUM: Magnesium: 1.7 mg/dL

## 2014-06-05 LAB — BASIC METABOLIC PANEL
ANION GAP: 1 — AB (ref 7–16)
BUN: 26 mg/dL — ABNORMAL HIGH
CO2: 32 mmol/L
CREATININE: 0.99 mg/dL
Calcium, Total: 7 mg/dL — CL
Chloride: 101 mmol/L
EGFR (African American): >60
Glucose: 212 mg/dL — ABNORMAL HIGH
POTASSIUM: 3.8 mmol/L
SODIUM: 134 mmol/L — AB

## 2014-06-05 LAB — PHOSPHORUS: PHOSPHORUS: 3.6 mg/dL

## 2014-06-05 LAB — MAGNESIUM: Magnesium: 1.7 mg/dL

## 2014-06-06 LAB — SODIUM: Sodium: 134 mmol/L — ABNORMAL LOW

## 2014-06-06 LAB — CALCIUM: CALCIUM: 7.3 mg/dL — AB

## 2014-06-06 LAB — POTASSIUM: POTASSIUM: 4 mmol/L

## 2014-06-06 LAB — PHOSPHORUS: Phosphorus: 3.6 mg/dL

## 2014-06-06 LAB — MAGNESIUM: Magnesium: 1.7 mg/dL

## 2014-06-08 LAB — CBC WITH DIFFERENTIAL/PLATELET
BASOS PCT: 0.3 %
Basophil #: 0 10*3/uL (ref 0.0–0.1)
EOS PCT: 1.3 %
Eosinophil #: 0.2 10*3/uL (ref 0.0–0.7)
HCT: 26.5 % — ABNORMAL LOW (ref 40.0–52.0)
HGB: 8.5 g/dL — ABNORMAL LOW (ref 13.0–18.0)
Lymphocyte #: 1.1 10*3/uL (ref 1.0–3.6)
Lymphocyte %: 8.6 %
MCH: 30.2 pg (ref 26.0–34.0)
MCHC: 32.2 g/dL (ref 32.0–36.0)
MCV: 94 fL (ref 80–100)
MONOS PCT: 9.2 %
Monocyte #: 1.1 x10 3/mm — ABNORMAL HIGH (ref 0.2–1.0)
NEUTROS PCT: 80.6 %
Neutrophil #: 9.9 10*3/uL — ABNORMAL HIGH (ref 1.4–6.5)
Platelet: 359 10*3/uL (ref 150–440)
RBC: 2.83 10*6/uL — AB (ref 4.40–5.90)
RDW: 13.7 % (ref 11.5–14.5)
WBC: 12.3 10*3/uL — AB (ref 3.8–10.6)

## 2014-06-08 LAB — COMPREHENSIVE METABOLIC PANEL
ALBUMIN: 1.5 g/dL — AB
ALK PHOS: 101 U/L
ANION GAP: 8 (ref 7–16)
AST: 40 U/L
BUN: 23 mg/dL — ABNORMAL HIGH
Bilirubin,Total: 0.3 mg/dL
CALCIUM: 7.4 mg/dL — AB
CHLORIDE: 97 mmol/L — AB
Co2: 28 mmol/L
Creatinine: 0.94 mg/dL
Glucose: 172 mg/dL — ABNORMAL HIGH
Potassium: 4.4 mmol/L
SGPT (ALT): 41 U/L
SODIUM: 133 mmol/L — AB
TOTAL PROTEIN: 5.9 g/dL — AB

## 2014-06-08 LAB — MAGNESIUM: Magnesium: 1.8 mg/dL

## 2014-06-08 LAB — PHOSPHORUS: PHOSPHORUS: 3.7 mg/dL

## 2014-06-09 LAB — BASIC METABOLIC PANEL
Anion Gap: 3 — ABNORMAL LOW (ref 7–16)
BUN: 22 mg/dL — AB
CALCIUM: 7.2 mg/dL — AB
CHLORIDE: 98 mmol/L — AB
CREATININE: 0.87 mg/dL
Co2: 30 mmol/L
EGFR (African American): >60
EGFR (Non-African Amer.): >60
Glucose: 197 mg/dL — ABNORMAL HIGH
Potassium: 4.8 mmol/L
Sodium: 131 mmol/L — ABNORMAL LOW

## 2014-06-09 LAB — MAGNESIUM: Magnesium: 1.6 mg/dL — ABNORMAL LOW

## 2014-06-09 LAB — APTT: ACTIVATED PTT: 39.5 s — AB (ref 23.6–35.9)

## 2014-06-09 LAB — PHOSPHORUS: PHOSPHORUS: 3.4 mg/dL

## 2014-06-09 LAB — PROTIME-INR
INR: 1.3
PROTHROMBIN TIME: 15.9 s — AB

## 2014-06-10 LAB — BASIC METABOLIC PANEL
Anion Gap: 7 (ref 7–16)
BUN: 23 mg/dL — ABNORMAL HIGH
CHLORIDE: 98 mmol/L — AB
Calcium, Total: 7.7 mg/dL — ABNORMAL LOW
Co2: 31 mmol/L
Creatinine: 0.98 mg/dL
EGFR (African American): >60
EGFR (Non-African Amer.): >60
GLUCOSE: 152 mg/dL — AB
Potassium: 4.8 mmol/L
Sodium: 136 mmol/L

## 2014-06-10 LAB — MAGNESIUM: Magnesium: 1.9 mg/dL

## 2014-06-10 LAB — PHOSPHORUS: Phosphorus: 4.3 mg/dL

## 2014-06-11 LAB — BASIC METABOLIC PANEL
ANION GAP: 7 (ref 7–16)
BUN: 26 mg/dL — ABNORMAL HIGH
CREATININE: 1.08 mg/dL
Calcium, Total: 7.5 mg/dL — ABNORMAL LOW
Chloride: 99 mmol/L — ABNORMAL LOW
Co2: 26 mmol/L
EGFR (African American): >60
EGFR (Non-African Amer.): >60
Glucose: 149 mg/dL — ABNORMAL HIGH
Potassium: 4.4 mmol/L
SODIUM: 132 mmol/L — AB

## 2014-06-11 LAB — MAGNESIUM: Magnesium: 1.8 mg/dL

## 2014-06-11 LAB — PHOSPHORUS: Phosphorus: 4.2 mg/dL

## 2014-06-12 LAB — BASIC METABOLIC PANEL
ANION GAP: 4 — AB (ref 7–16)
BUN: 26 mg/dL — ABNORMAL HIGH
CALCIUM: 7.2 mg/dL — AB
CHLORIDE: 100 mmol/L — AB
CO2: 26 mmol/L
Creatinine: 1.08 mg/dL
EGFR (African American): >60
EGFR (Non-African Amer.): >60
Glucose: 195 mg/dL — ABNORMAL HIGH
Potassium: 4.2 mmol/L
Sodium: 130 mmol/L — ABNORMAL LOW

## 2014-06-12 LAB — PHOSPHORUS: Phosphorus: 4 mg/dL

## 2014-06-12 LAB — MAGNESIUM: Magnesium: 1.9 mg/dL

## 2014-06-13 LAB — BASIC METABOLIC PANEL
Anion Gap: 8 (ref 7–16)
BUN: 33 mg/dL — ABNORMAL HIGH
CHLORIDE: 102 mmol/L
CO2: 24 mmol/L
Calcium, Total: 7.7 mg/dL — ABNORMAL LOW
Creatinine: 1.17 mg/dL
Glucose: 195 mg/dL — ABNORMAL HIGH
Potassium: 4.7 mmol/L
SODIUM: 134 mmol/L — AB

## 2014-06-13 LAB — CBC WITH DIFFERENTIAL/PLATELET
COMMENT - H1-COM3: NORMAL
Eosinophil: 6 %
HCT: 28.4 % — ABNORMAL LOW (ref 40.0–52.0)
HGB: 9 g/dL — ABNORMAL LOW (ref 13.0–18.0)
LYMPHS PCT: 17 %
MCH: 30.3 pg (ref 26.0–34.0)
MCHC: 31.8 g/dL — AB (ref 32.0–36.0)
MCV: 95 fL (ref 80–100)
Metamyelocyte: 2 %
Monocytes: 4 %
Myelocyte: 2 %
PLATELETS: 293 10*3/uL (ref 150–440)
RBC: 2.98 10*6/uL — ABNORMAL LOW (ref 4.40–5.90)
RDW: 13.9 % (ref 11.5–14.5)
Segmented Neutrophils: 69 %
WBC: 9.6 10*3/uL (ref 3.8–10.6)

## 2014-06-13 LAB — PHOSPHORUS: PHOSPHORUS: 4.4 mg/dL

## 2014-06-13 LAB — MAGNESIUM: MAGNESIUM: 2 mg/dL

## 2014-06-14 LAB — BASIC METABOLIC PANEL
Anion Gap: 1 — ABNORMAL LOW (ref 7–16)
BUN: 35 mg/dL — ABNORMAL HIGH
Calcium, Total: 7.2 mg/dL — ABNORMAL LOW
Chloride: 101 mmol/L
Co2: 26 mmol/L
Creatinine: 1.33 mg/dL — ABNORMAL HIGH
EGFR (African American): >60
EGFR (Non-African Amer.): 52 — ABNORMAL LOW
Glucose: 92 mg/dL
Potassium: 4.7 mmol/L
Sodium: 128 mmol/L — ABNORMAL LOW

## 2014-06-14 LAB — PHOSPHORUS: Phosphorus: 4.5 mg/dL

## 2014-06-14 LAB — MAGNESIUM: Magnesium: 1.9 mg/dL

## 2014-06-15 LAB — BASIC METABOLIC PANEL
Anion Gap: 1 — ABNORMAL LOW (ref 7–16)
BUN: 29 mg/dL — ABNORMAL HIGH
CREATININE: 1.34 mg/dL — AB
Calcium, Total: 7.3 mg/dL — ABNORMAL LOW
Chloride: 105 mmol/L
Co2: 25 mmol/L
EGFR (African American): 60 — ABNORMAL LOW
GFR CALC NON AF AMER: 51 — AB
Glucose: 113 mg/dL — ABNORMAL HIGH
Potassium: 4.9 mmol/L
Sodium: 131 mmol/L — ABNORMAL LOW

## 2014-06-15 LAB — CBC WITH DIFFERENTIAL/PLATELET
Basophil #: 0.1 10*3/uL (ref 0.0–0.1)
Basophil %: 0.6 %
EOS ABS: 0.3 10*3/uL (ref 0.0–0.7)
EOS PCT: 3.7 %
HCT: 26.2 % — ABNORMAL LOW (ref 40.0–52.0)
HGB: 8.5 g/dL — AB (ref 13.0–18.0)
LYMPHS PCT: 14.8 %
Lymphocyte #: 1.2 10*3/uL (ref 1.0–3.6)
MCH: 30.5 pg (ref 26.0–34.0)
MCHC: 32.2 g/dL (ref 32.0–36.0)
MCV: 95 fL (ref 80–100)
MONO ABS: 0.9 x10 3/mm (ref 0.2–1.0)
Monocyte %: 11.4 %
Neutrophil #: 5.7 10*3/uL (ref 1.4–6.5)
Neutrophil %: 69.5 %
Platelet: 266 10*3/uL (ref 150–440)
RBC: 2.77 10*6/uL — AB (ref 4.40–5.90)
RDW: 13.6 % (ref 11.5–14.5)
WBC: 8.1 10*3/uL (ref 3.8–10.6)

## 2014-06-15 LAB — BODY FLUID CULTURE

## 2014-06-16 LAB — CBC WITH DIFFERENTIAL/PLATELET
BASOS ABS: 0.1 10*3/uL (ref 0.0–0.1)
Basophil %: 0.8 %
EOS PCT: 4.3 %
Eosinophil #: 0.3 10*3/uL (ref 0.0–0.7)
HCT: 29.1 % — ABNORMAL LOW (ref 40.0–52.0)
HGB: 9.4 g/dL — ABNORMAL LOW (ref 13.0–18.0)
LYMPHS ABS: 1.5 10*3/uL (ref 1.0–3.6)
Lymphocyte %: 19.9 %
MCH: 30.5 pg (ref 26.0–34.0)
MCHC: 32.4 g/dL (ref 32.0–36.0)
MCV: 94 fL (ref 80–100)
Monocyte #: 0.8 x10 3/mm (ref 0.2–1.0)
Monocyte %: 10.2 %
Neutrophil #: 4.8 10*3/uL (ref 1.4–6.5)
Neutrophil %: 64.8 %
Platelet: 276 10*3/uL (ref 150–440)
RBC: 3.09 10*6/uL — AB (ref 4.40–5.90)
RDW: 14.1 % (ref 11.5–14.5)
WBC: 7.4 10*3/uL (ref 3.8–10.6)

## 2014-06-16 LAB — BASIC METABOLIC PANEL
Anion Gap: 4 — ABNORMAL LOW (ref 7–16)
BUN: 22 mg/dL — ABNORMAL HIGH
Calcium, Total: 7.8 mg/dL — ABNORMAL LOW
Chloride: 105 mmol/L
Co2: 28 mmol/L
Creatinine: 1.24 mg/dL
EGFR (African American): >60
EGFR (Non-African Amer.): 57 — ABNORMAL LOW
GLUCOSE: 107 mg/dL — AB
POTASSIUM: 5.5 mmol/L — AB
SODIUM: 137 mmol/L

## 2014-06-16 LAB — POTASSIUM: POTASSIUM: 5.4 mmol/L — AB

## 2014-06-17 LAB — POTASSIUM: Potassium: 4.6 mmol/L

## 2014-06-19 NOTE — Discharge Summary (Signed)
PATIENT NAME:  Jack White, Jack White MR#:  [id number] DATE OF BIRTH:  December 29, 1940  DATE OF ADMISSION:  03/25/2011 DATE OF DISCHARGE:  03/28/2011  ADMITTING DIAGNOSIS: Degenerative arthrosis of the left knee.   DISCHARGE DIAGNOSIS: Degenerative arthrosis of the left knee.   HISTORY: The patient is a 74 year old male who has been followed at [facility] for progression of left knee pain. He had previously undergone a left knee arthroscopy by Dr. Felicity Pellegrinied Armour with only modest improvement in his left knee. This was done several years ago. He denied any significant swelling of the knee but does on occasion have some popping and near giving-way of the knee. The patient states his pain was aggravated with weight-bearing activities. He was having difficulty with arising from a seated position as well as ascending and descending stairs. The patient had reported only modest improvement with the use of over-the-counter anti-inflammatory medications. The pain had progressed to the point that it was significantly interfering with his activities of daily living. X-rays taken in the clinic showed narrowing of the medial cartilage space with associated varus alignment. He was noted to have osteophyte as well as subchondral sclerosis. After discussion of the risks and benefits of surgical intervention, the patient expressed his understanding of the risks and benefits and agreed with plans for surgical intervention.   PROCEDURE: Left total knee arthroplasty using computer-assisted navigation.   ANESTHESIA: Femoral nerve block with spinal.   SOFT TISSUE RELEASE: Anterior cruciate ligament, posterior cruciate ligament, deep medial collateral ligaments, as well as the patellofemoral ligament.   IMPLANTS UTILIZED: DePuy PFC Sigma size five posterior stabilized femoral component (cemented), size five MBT tibial component (cemented), 41-mm three pegged oval dome patella (cemented), and a 12.5-mm stabilized rotating platform  polyethylene insert. Gentamicin cement was used secondary to the patient's history of diabetes.   HOSPITAL COURSE: The patient tolerated the procedure very well.  He had no complications. He was then taken to the PAC-U where he was stabilized and then transferred to the orthopedic floor. The patient began receiving anticoagulation therapy of Lovenox 30 mg subcutaneous every 12 hours per anesthesia and pharmacy protocol. He was fitted with TED stockings bilaterally. These were allowed to be removed one hour per eight-hour shift. The left one was applied on day two following removal of the Hemovac and dressing change. The patient was also fitted with AVI compression foot pumps bilaterally set at 130 mmHg. The patient has had no evidence of any deep venous thromboses of the lower extremity. He complained of no discomfort. Calves were nontender. Negative Homans sign. Heels were elevated off the bed using rolled towels.   The patient has denied any chest pain or shortness of breath. Vital signs have been stable. He has been afebrile. Hemodynamically he was stable. No transfusions were given other than the Autovac transfusion given the first six hours postoperatively.   Physical therapy was initiated on day one for gait training and transfers. He has done very well. However, he is moving very unsafely with the use of a walker. At times he would use the walker with only one hand. Occupational therapy was also initiated on day one for activities of daily living and assistive devices.   The patient's IV, Foley, and Hemovac were discontinued on day two along with a dressing change. The wound has been free of any drainage or signs of infection. Polar Care was reapplied to the surgical leg.   DISPOSITION: The patient is being discharged to skilled nursing facility in improved  stable condition.   DISCHARGE INSTRUCTIONS:  1. He will continue weight-bearing as tolerated.  2. Continue physical therapy for gait  training and transfers, occupational therapy for activities of daily living and assistive devices.  3. Continue with TED stockings. These are allowed to be removed one hour per eight-hour shift.  4. Incentive spirometry every hour while awake.  5. Encourage cough with deep breathing q. 2 hours while awake.  6. Polar Care to the surgical leg maintaining a temperature of 40 to 50 degrees Fahrenheit.  7. He is placed on an ADA diet.  8. He has a follow-up appointment in the clinic on 02/12 with Van Clines, PA and on 03/12 with Dr. Ernest Pine. He is to call the clinic if any complications or any temperature of 101.5 or greater or excessive bleeding.   DRUG ALLERGIES: No known drug allergies.   MEDICATIONS:  1. Tylenol ES 500 to 1000 mg every four hours p.r.n.  2. Celebrex 200 mg b.i.d.  3. Roxicodone 5 to 10 mg q. 4 hours p.r.n.  4. Tramadol 50 to 100 mg q. 4 hours p.r.n.  5. Cardura 2 mg daily. 6. Metformin 1000 mg b.i.d. with meals.  7. Ambien 10 mg at bedtime p.r.n. 8. Benadryl 50 mg q. 6 hours p.r.n.  9. Dulcolax suppository 10 mg rectally p.r.n.  10. Milk of Magnesia 30 mL b.i.d. p.r.n.  11. Enema soapsuds if no results with Milk of Magnesia or Dulcolax.  12. Mylanta DS 30 mL q. 6 hours p.r.n.  13. Pantoprazole 40 mg b.i.d.  14. Senokot-S 1 tablet b.i.d.  15. Insulin sliding scale, Novolin R injections.  16. Lovenox 30 mg subcutaneous q.12 hours for 14 days, then discontinue and begin taking one 81- mg enteric-coated aspirin per day.  17. Non-formulary medication for leg cramps 2 tablets usual schedule.   PAST MEDICAL HISTORY:  1. Seasonal allergies.  2. Arthritis.  3. Chickenpox.  4. Shingles.  5. Diabetes mellitus.  6. Ulcers.  7. Cataracts.  8. Skin cancer of ear.    ____________________________ Van Clines, PA jrw:bjt D:  03/28/2011 08:17:14 ET          T: 03/28/2011 09:01:54 ET         JOB#: [id number]  cc: Van Clines, PA, <Dictator> Janya Eveland PA ELECTRONICALLY SIGNED 03/29/2011  7:58

## 2014-06-19 NOTE — Op Note (Signed)
PATIENT NAME:  Jack White, BIENVENUE MR#:  [id number] DATE OF BIRTH:  [birth year]  DATE OF PROCEDURE:  03/25/2011  PREOPERATIVE DIAGNOSIS: Degenerative arthrosis of the left knee.   POSTOPERATIVE DIAGNOSIS: Degenerative arthrosis of the left knee.   PROCEDURE PERFORMED: Left total knee arthroplasty using computer-assisted navigation.   SURGEON: Illene Labrador. Angie Fava., MD  ASSISTANT: Van Clines, PA-C (required to maintain retraction throughout the procedure)   ANESTHESIA: Femoral nerve block and spinal.   ESTIMATED BLOOD LOSS: 150 mL.   FLUIDS REPLACED: 1200 mL of crystalloid.   TOURNIQUET TIME: 103 minutes.   DRAINS: Two medium drains to reinfusion system.   SOFT TISSUE RELEASES: Anterior cruciate ligament, posterior cruciate ligament, deep medial collateral ligament, and patellofemoral ligament.   IMPLANTS UTILIZED: DePuy PFC Sigma size 5 posterior stabilized femoral component (cemented), size 5 MBT tibial component (cemented), 41 mm three peg oval dome patella (cemented), and a 12.5 mm stabilized rotating platform polyethylene insert. Gentamicin cement was used due to the patient's history of diabetes.   INDICATIONS FOR SURGERY: The patient is a 74 year old male who has been seen for complaints of progressive left knee pain. X-rays demonstrate severe degenerative changes with varus deformity and near bone on bone articulation to the medial compartment. After discussion of the risks and benefits of surgical intervention, the patient expressed his understanding of the risks and benefits and agreed with plans for surgical intervention.   PROCEDURE IN DETAIL: Patient was brought into the Operating Room and, after adequate femoral nerve block and spinal anesthesia was achieved, tourniquet was placed on patient's upper left thigh. Patient's left knee and leg were cleaned and prepped with alcohol and DuraPrep, draped in the usual sterile fashion. A "timeout" was performed as per usual protocol. The left  lower extremity was exsanguinated using Esmarch, and tourniquet was inflated to 300 mmHg. Anterior longitudinal incision was made followed by a standard mid vastus approach. A moderate effusion was evacuated. Deep fibers of the medial collateral ligament were elevated in subperiosteal fashion off the medial flare of the tibia so as to maintain a continuous soft tissue sleeve. Patella was subluxed laterally and the patellofemoral ligament was incised. Inspection of the knee demonstrated severe degenerative changes with eburnated bone noted to the medial compartment. Prominent osteophytes were debrided using rongeur. Anterior and posterior cruciate ligaments were excised. Two 4.0 mm Schanz pins were inserted into the femur and into the tibia for attachment of the array of spheres used for computer-assisted navigation. Hip center was identified using circumduction technique. Distal landmarks were mapped using computer. Distal femur and proximal tibia were mapped using computer. Distal femoral cutting guide was positioned using computer-assisted navigation so as to achieve 5 degrees distal valgus cut. Cut was performed and verified using the computer. Distal femur was sized and it was felt that a size 5 femoral component was appropriate. A size 5 cutting guide was positioned using computer-assisted navigation. Anterior cut was performed and verified using computer. This was followed by completion of the posterior and chamfer cuts. Femoral cutting guide for the central box was then positioned and central box cut was performed.   Attention was then directed to the proximal tibia. Medial and lateral menisci were excised. The extramedullary tibial cutting guide was positioned using computer-assisted navigation so as to achieve 0 degrees varus valgus alignment and 0 degrees posterior slope. Cut was performed and verified using the computer. Proximal tibia was sized and it was felt that a size 5 tibial tray was appropriate.  Tibial and  femoral trials were inserted followed by insertion of first a 10 and subsequently a 12.5 mm polyethylene insert. Good medial and lateral soft tissue balancing was appreciated both in full extension and in 90 degrees of flexion. Finally, the patella was cut and prepared so as to accommodate a 41 mm three peg oval dome patella. Patellar trial was placed and knee was placed through a range of motion with excellent patellar tracking appreciated. Femoral component was removed. Central post hole for the tibial component was reamed followed by insertion of keel punch. Tibial trials were then removed. Cut surfaces of bone were irrigated with copious amounts of normal saline with antibiotic solution using pulsatile lavage and then suctioned dry. Polymethyl methacrylate cement with gentamicin was prepared in the usual fashion using a vacuum mixer. Cement was applied to the cut surface of the proximal tibia as well as along the undersurface of a size 5 in MBT tibial component. Tibial component was positioned and impacted into place. Excess cement was removed using freer elevators. Cement was then applied to cut surface of the femur as well as along the posterior flanges of a size 5 posterior stabilized femoral component. Femoral component was positioned and impacted into place. Excess cement was removed using freer elevators. A 12.5 mm polyethylene trial was inserted and the knee was brought into full extension with steady axial compression applied. Finally, cement was applied to the backside of a 41 mm three peg oval dome patella and patellar component was positioned and patellar clamp applied. Excess cement was removed using freer elevators.   After adequate curing of cement, tourniquet was deflated after a total tourniquet time of 103 minutes. Hemostasis was achieved using electrocautery. The knee was irrigated with copious amounts of normal saline with antibiotic solution using pulsatile lavage and then  suctioned dry. The knee was inspected for any residual cement debris. 30 mL of 0.25% Marcaine with epinephrine was injected along the posterior capsule. A 12.5 mm stabilized rotating platform polyethylene insert was inserted and the knee was placed through a range of motion. Excellent patellar tracking was appreciated. Excellent mediolateral soft tissue balancing was appreciated both in full extension and in 90 degrees of flexion.   Two medium drains were placed in the wound bed and brought out through a separate stab incision to be attached to reinfusion system. The medial parapatellar portion of the incision was reapproximated using interrupted sutures of #1 Vicryl. The subcutaneous tissue was approximated in layers using first #0 Vicryl followed by 2-0 Vicryl. Skin was closed with skin staples. Sterile dressing was applied.   Patient tolerated the procedure well. He was transported to the recovery room in stable condition.   ____________________________ Illene LabradorJames P. Angie FavaHooten Jr., MD jph:cms D: 03/25/2011 11:03:41 ET T: 03/25/2011 11:18:04 ET JOB#: [id number]  cc: Fayrene FearingJames P. Angie FavaHooten Jr., MD, <Dictator> JAMES P Angie FavaHOOTEN JR MD ELECTRONICALLY SIGNED 03/31/2011 16:03

## 2014-06-20 LAB — SURGICAL PATHOLOGY

## 2014-06-22 ENCOUNTER — Inpatient Hospital Stay
Admission: EM | Admit: 2014-06-22 | Discharge: 2014-06-29 | DRG: 641 | Disposition: A | Discharge status: Skilled Nursing Facility | Payer: Medicare Other | Attending: Surgery | Admitting: Surgery

## 2014-06-22 DIAGNOSIS — Z85828 Personal history of other malignant neoplasm of skin: Secondary | ICD-10-CM | POA: Diagnosis not present

## 2014-06-22 DIAGNOSIS — Z885 Allergy status to narcotic agent status: Secondary | ICD-10-CM

## 2014-06-22 DIAGNOSIS — E119 Type 2 diabetes mellitus without complications: Secondary | ICD-10-CM | POA: Diagnosis present

## 2014-06-22 DIAGNOSIS — E860 Dehydration: Principal | ICD-10-CM | POA: Diagnosis present

## 2014-06-22 DIAGNOSIS — Z9889 Other specified postprocedural states: Secondary | ICD-10-CM

## 2014-06-22 DIAGNOSIS — Z9049 Acquired absence of other specified parts of digestive tract: Secondary | ICD-10-CM | POA: Diagnosis present

## 2014-06-22 DIAGNOSIS — Z96653 Presence of artificial knee joint, bilateral: Secondary | ICD-10-CM | POA: Diagnosis present

## 2014-06-22 DIAGNOSIS — K810 Acute cholecystitis: Secondary | ICD-10-CM | POA: Diagnosis present

## 2014-06-22 DIAGNOSIS — I252 Old myocardial infarction: Secondary | ICD-10-CM

## 2014-06-22 LAB — CBC
HCT: 32.6 % — AB (ref 40.0–52.0)
HGB: 10.8 g/dL — AB (ref 13.0–18.0)
MCH: 31 pg (ref 26.0–34.0)
MCHC: 33.1 g/dL (ref 32.0–36.0)
MCV: 94 fL (ref 80–100)
Platelet: 240 10*3/uL (ref 150–440)
RBC: 3.48 10*6/uL — ABNORMAL LOW (ref 4.40–5.90)
RDW: 13.9 % (ref 11.5–14.5)
WBC: 6.2 10*3/uL (ref 3.8–10.6)

## 2014-06-22 LAB — COMPREHENSIVE METABOLIC PANEL
ALBUMIN: 2.8 g/dL — AB
ALK PHOS: 96 U/L
ANION GAP: 10 (ref 7–16)
BUN: 45 mg/dL — AB
Bilirubin,Total: 0.4 mg/dL
CALCIUM: 8.5 mg/dL — AB
CHLORIDE: 100 mmol/L — AB
Co2: 25 mmol/L
Creatinine: 2.05 mg/dL — ABNORMAL HIGH
EGFR (African American): 36 — ABNORMAL LOW
EGFR (Non-African Amer.): 31 — ABNORMAL LOW
GLUCOSE: 145 mg/dL — AB
POTASSIUM: 5.8 mmol/L — AB
SGOT(AST): 28 U/L
SGPT (ALT): 24 U/L
Sodium: 135 mmol/L
Total Protein: 8.4 g/dL — ABNORMAL HIGH

## 2014-06-22 LAB — URINALYSIS, COMPLETE
BILIRUBIN, UR: NEGATIVE
BLOOD: NEGATIVE
Bacteria: NONE SEEN
Glucose,UR: NEGATIVE mg/dL (ref 0–75)
LEUKOCYTE ESTERASE: NEGATIVE
Nitrite: NEGATIVE
PROTEIN: NEGATIVE
Ph: 5 (ref 4.5–8.0)
Specific Gravity: 1.028 (ref 1.003–1.030)
Squamous Epithelial: NONE SEEN

## 2014-06-22 LAB — TROPONIN I: Troponin-I: <0.03 ng/mL

## 2014-06-22 LAB — CLOSTRIDIUM DIFFICILE(ARMC)

## 2014-06-22 LAB — LIPASE, BLOOD: Lipase: 85 U/L — ABNORMAL HIGH

## 2014-06-23 LAB — CBC WITH DIFFERENTIAL/PLATELET
BASOS PCT: 0.5 %
Basophil #: 0 10*3/uL (ref 0.0–0.1)
Eosinophil #: 0.1 10*3/uL (ref 0.0–0.7)
Eosinophil %: 2.2 %
HCT: 29.5 % — ABNORMAL LOW (ref 40.0–52.0)
HGB: 9.6 g/dL — ABNORMAL LOW (ref 13.0–18.0)
LYMPHS ABS: 1.2 10*3/uL (ref 1.0–3.6)
Lymphocyte %: 21.6 %
MCH: 30.6 pg (ref 26.0–34.0)
MCHC: 32.6 g/dL (ref 32.0–36.0)
MCV: 94 fL (ref 80–100)
MONO ABS: 0.4 x10 3/mm (ref 0.2–1.0)
Monocyte %: 7.1 %
NEUTROS ABS: 3.8 10*3/uL (ref 1.4–6.5)
Neutrophil %: 68.6 %
PLATELETS: 209 10*3/uL (ref 150–440)
RBC: 3.14 10*6/uL — AB (ref 4.40–5.90)
RDW: 13.5 % (ref 11.5–14.5)
WBC: 5.5 10*3/uL (ref 3.8–10.6)

## 2014-06-23 LAB — BASIC METABOLIC PANEL
Anion Gap: 7 (ref 7–16)
BUN: 36 mg/dL — ABNORMAL HIGH
CALCIUM: 8.2 mg/dL — AB
Chloride: 104 mmol/L
Co2: 26 mmol/L
Creatinine: 1.78 mg/dL — ABNORMAL HIGH
EGFR (African American): 42 — ABNORMAL LOW
EGFR (Non-African Amer.): 37 — ABNORMAL LOW
GLUCOSE: 110 mg/dL — AB
Potassium: 5.1 mmol/L
SODIUM: 137 mmol/L

## 2014-06-23 LAB — LACTIC ACID, PLASMA: LACTIC ACID, VENOUS: 0.9 mmol/L

## 2014-06-24 LAB — BASIC METABOLIC PANEL
Anion Gap: 7 (ref 7–16)
BUN: 23 mg/dL — ABNORMAL HIGH
CALCIUM: 7.7 mg/dL — AB
CHLORIDE: 106 mmol/L
CO2: 24 mmol/L
Creatinine: 1.43 mg/dL — ABNORMAL HIGH
EGFR (African American): 55 — ABNORMAL LOW
EGFR (Non-African Amer.): 48 — ABNORMAL LOW
GLUCOSE: 96 mg/dL
POTASSIUM: 4.6 mmol/L
SODIUM: 137 mmol/L

## 2014-06-24 LAB — PLATELET COUNT: Platelet: 179 10*3/uL (ref 150–440)

## 2014-06-25 LAB — CBC WITH DIFFERENTIAL/PLATELET
BASOS PCT: 0.4 %
Basophil #: 0 10*3/uL (ref 0.0–0.1)
Eosinophil #: 0 10*3/uL (ref 0.0–0.7)
Eosinophil %: 0.4 %
HCT: 25.5 % — ABNORMAL LOW (ref 40.0–52.0)
HGB: 8.4 g/dL — ABNORMAL LOW (ref 13.0–18.0)
LYMPHS ABS: 1.7 10*3/uL (ref 1.0–3.6)
Lymphocyte %: 30.3 %
MCH: 30.8 pg (ref 26.0–34.0)
MCHC: 32.8 g/dL (ref 32.0–36.0)
MCV: 94 fL (ref 80–100)
MONOS PCT: 7.3 %
Monocyte #: 0.4 x10 3/mm (ref 0.2–1.0)
NEUTROS ABS: 3.5 10*3/uL (ref 1.4–6.5)
NEUTROS PCT: 61.6 %
PLATELETS: 169 10*3/uL (ref 150–440)
RBC: 2.73 10*6/uL — AB (ref 4.40–5.90)
RDW: 13.5 % (ref 11.5–14.5)
WBC: 5.8 10*3/uL (ref 3.8–10.6)

## 2014-06-25 LAB — BASIC METABOLIC PANEL
Anion Gap: 4 — ABNORMAL LOW (ref 7–16)
BUN: 17 mg/dL
CO2: 24 mmol/L
CREATININE: 1.18 mg/dL
Calcium, Total: 7.8 mg/dL — ABNORMAL LOW
Chloride: 110 mmol/L
EGFR (African American): >60
EGFR (Non-African Amer.): >60
Glucose: 99 mg/dL
Potassium: 4.5 mmol/L
Sodium: 138 mmol/L

## 2014-06-25 MED ORDER — ONDANSETRON HCL 4 MG/2ML IJ SOLN: 4.0000 mg | INTRAMUSCULAR | Status: DC | PRN

## 2014-06-25 MED ORDER — SODIUM CHLORIDE 0.9 % IV SOLN
INTRAVENOUS | Status: DC
  Administered 2014-06-26 – 2014-06-28 (×3): via INTRAVENOUS

## 2014-06-25 MED ORDER — DIPHENHYDRAMINE HCL 50 MG/ML IJ SOLN
12.5000 mg | INTRAMUSCULAR | Status: DC | PRN
  Administered 2014-06-27 – 2014-06-28 (×2): 12.5 mg via INTRAVENOUS
  Filled 2014-06-25 (×2): qty 1

## 2014-06-25 MED ORDER — HYDROXYZINE HCL 10 MG PO TABS: 10.0000 mg | ORAL_TABLET | Freq: Four times a day (QID) | ORAL | Status: DC | PRN

## 2014-06-25 MED ORDER — MELATONIN 3 MG PO TABS
9.0000 mg | ORAL_TABLET | Freq: Every day | ORAL | Status: DC
  Administered 2014-06-26 – 2014-06-28 (×3): 9 mg via ORAL
  Filled 2014-06-25 (×4): qty 3

## 2014-06-25 MED ORDER — HEPARIN SODIUM (PORCINE) 5000 UNIT/ML IJ SOLN
5000.0000 [IU] | Freq: Two times a day (BID) | INTRAMUSCULAR | Status: DC
  Administered 2014-06-26 – 2014-06-29 (×7): 5000 [IU] via SUBCUTANEOUS
  Filled 2014-06-25 (×7): qty 1

## 2014-06-25 MED ORDER — SODIUM CHLORIDE 0.9 % IJ SOLN: 3.0000 mL | INTRAMUSCULAR | Status: DC | PRN

## 2014-06-25 MED ORDER — TRAZODONE HCL 50 MG PO TABS
50.0000 mg | ORAL_TABLET | Freq: Every day | ORAL | Status: DC
  Administered 2014-06-26 – 2014-06-28 (×3): 50 mg via ORAL
  Filled 2014-06-25 (×3): qty 1

## 2014-06-25 MED ORDER — ZOLPIDEM TARTRATE 5 MG PO TABS
5.0000 mg | ORAL_TABLET | Freq: Every evening | ORAL | Status: DC | PRN
  Administered 2014-06-27 – 2014-06-28 (×2): 5 mg via ORAL
  Filled 2014-06-25 (×2): qty 1

## 2014-06-25 MED ORDER — PROMETHAZINE HCL 25 MG/ML IJ SOLN: 12.5000 mg | INTRAMUSCULAR | Status: DC | PRN

## 2014-06-25 MED ORDER — ASPIRIN EC 81 MG PO TBEC
81.0000 mg | DELAYED_RELEASE_TABLET | Freq: Every day | ORAL | Status: DC
  Administered 2014-06-26 – 2014-06-29 (×4): 81 mg via ORAL
  Filled 2014-06-25 (×4): qty 1

## 2014-06-25 MED ORDER — BUTALBITAL-APAP-CAFFEINE 50-325-40 MG PO TABS: 1.0000 | ORAL_TABLET | ORAL | Status: DC | PRN

## 2014-06-25 MED ORDER — MORPHINE SULFATE 2 MG/ML IJ SOLN: 2.0000 mg | INTRAMUSCULAR | Status: DC | PRN

## 2014-06-25 MED ORDER — INSULIN ASPART 100 UNIT/ML ~~LOC~~ SOLN
0.0000 [IU] | Freq: Three times a day (TID) | SUBCUTANEOUS | Status: DC
Start: 2014-06-26 — End: 2014-06-29
  Administered 2014-06-26 – 2014-06-28 (×2): 2 [IU] via SUBCUTANEOUS
  Filled 2014-06-25: qty 2
  Filled 2014-06-25: qty 10
  Filled 2014-06-25: qty 2

## 2014-06-25 MED ORDER — ALPRAZOLAM 0.25 MG PO TABS
0.2500 mg | ORAL_TABLET | Freq: Every day | ORAL | Status: DC | PRN
  Administered 2014-06-28: 01:00:00 0.25 mg via ORAL

## 2014-06-25 MED ORDER — ENSURE ENLIVE PO LIQD
237.0000 mL | Freq: Three times a day (TID) | ORAL | Status: DC
  Administered 2014-06-26 – 2014-06-29 (×6): 237 mL via ORAL

## 2014-06-25 MED ORDER — HEPARIN SODIUM (PORCINE) 5000 UNIT/ML IJ SOLN: 5000.0000 [IU] | Freq: Three times a day (TID) | INTRAMUSCULAR | Status: DC

## 2014-06-25 MED ORDER — ONDANSETRON 4 MG PO TBDP
4.0000 mg | ORAL_TABLET | ORAL | Status: DC
Start: 2014-06-26 — End: 2014-06-27
  Administered 2014-06-26 – 2014-06-27 (×5): 4 mg via ORAL
  Filled 2014-06-25 (×5): qty 1

## 2014-06-25 MED ORDER — DOXAZOSIN MESYLATE 2 MG PO TABS
2.0000 mg | ORAL_TABLET | Freq: Two times a day (BID) | ORAL | Status: DC
  Administered 2014-06-26 – 2014-06-29 (×7): 2 mg via ORAL
  Filled 2014-06-25 (×7): qty 1

## 2014-06-25 MED ORDER — ALPRAZOLAM 0.5 MG PO TABS
0.5000 mg | ORAL_TABLET | Freq: Every evening | ORAL | Status: DC | PRN
Start: 2014-06-26 — End: 2014-06-29
  Administered 2014-06-26 – 2014-06-28 (×2): 0.5 mg via ORAL
  Filled 2014-06-25 (×3): qty 1

## 2014-06-25 MED ORDER — SODIUM CHLORIDE 0.9 % IV SOLN: INTRAVENOUS | Status: DC

## 2014-06-25 MED ORDER — HYDROCORTISONE 1 % EX CREA
TOPICAL_CREAM | Freq: Three times a day (TID) | CUTANEOUS | Status: DC
  Administered 2014-06-26 – 2014-06-29 (×7): via TOPICAL

## 2014-06-26 LAB — GLUCOSE, CAPILLARY
Glucose-Capillary: 108 mg/dL — ABNORMAL HIGH (ref 70–99)
Glucose-Capillary: 166 mg/dL — ABNORMAL HIGH (ref 70–99)
Glucose-Capillary: 143 mg/dL — ABNORMAL HIGH (ref 70–99)
Glucose-Capillary: 122 mg/dL — ABNORMAL HIGH (ref 70–99)

## 2014-06-26 MED ORDER — LOPERAMIDE HCL 2 MG PO CAPS: 2.0000 mg | ORAL_CAPSULE | ORAL | Status: DC | PRN

## 2014-06-26 MED ORDER — MELATONIN 3 MG PO TABS: 9.0000 mg | ORAL_TABLET | Freq: Every day | ORAL | Status: DC

## 2014-06-26 MED ORDER — DIPHENOXYLATE-ATROPINE 2.5-0.025 MG PO TABS
1.0000 | ORAL_TABLET | Freq: Four times a day (QID) | ORAL | Status: DC | PRN
  Administered 2014-06-26: 16:00:00 1 via ORAL
  Filled 2014-06-26: qty 1

## 2014-06-26 MED ORDER — HYDROXYZINE HCL 10 MG PO TABS: 10.0000 mg | ORAL_TABLET | Freq: Four times a day (QID) | ORAL | Status: DC | PRN

## 2014-06-26 MED ORDER — HYDROCORTISONE 1 % EX CREA: TOPICAL_CREAM | Freq: Three times a day (TID) | CUTANEOUS | Status: DC

## 2014-06-26 MED ORDER — ZOLPIDEM TARTRATE 5 MG PO TABS: 5.0000 mg | ORAL_TABLET | Freq: Every evening | ORAL | Status: DC | PRN

## 2014-06-26 MED ORDER — ONDANSETRON 4 MG PO TBDP: 4.0000 mg | ORAL_TABLET | Freq: Three times a day (TID) | ORAL | Status: DC | PRN

## 2014-06-26 MED ORDER — ENSURE ENLIVE PO LIQD: 237.0000 mL | Freq: Three times a day (TID) | ORAL | Status: DC

## 2014-06-26 MED ORDER — HYDROCODONE-ACETAMINOPHEN 5-325 MG PO TABS: 1.0000 | ORAL_TABLET | Freq: Four times a day (QID) | ORAL | Status: DC | PRN

## 2014-06-26 NOTE — Consult Note (Signed)
Chief Complaint:  Subjective/Chief Complaint Please see full GI consult and brief consult note. currently denies abdominal pain or nausea.   VITAL SIGNS/ANCILLARY NOTES: **Vital Signs.:   24-Mar-16 12:13  Vital Signs Type Routine  Temperature Temperature (F) 98.4  Celsius 36.8  Pulse Pulse 107  Respirations Respirations 16  Systolic BP Systolic BP 99  Diastolic BP (mmHg) Diastolic BP (mmHg) 65  Mean BP 76  Pulse Ox % Pulse Ox % 92  Pulse Ox Activity Level  At rest  Oxygen Delivery 2L   Brief Assessment:  GEN obese   Cardiac Regular   Respiratory clear BS   Gastrointestinal details normal Nontender  Nondistended  Bowel sounds normal   Lab Results: Hepatic:  24-Mar-16 04:05   Bilirubin, Total  1.6 (0.3-1.2 NOTE: New Reference Range  05/03/14)  Alkaline Phosphatase 61 (38-126 NOTE: New Reference Range  05/03/14)  SGPT (ALT)  12 (17-63 NOTE: New Reference Range  05/03/14)  SGOT (AST) 20 (15-41 NOTE: New Reference Range  05/03/14)  Total Protein, Serum  5.8 (6.5-8.1 NOTE: New Reference Range  05/03/14)  Albumin, Serum  2.7 (3.5-5.0 NOTE: New reference range  05/03/14)  Routine Chem:  24-Mar-16 04:05   Glucose, Serum  154 (65-99 NOTE: New Reference Range  05/03/14)  BUN 17 (6-20 NOTE: New Reference Range  05/03/14)  Creatinine (comp)  1.52 (0.61-1.24 NOTE: New Reference Range  05/03/14)  Sodium, Serum 137 (135-145 NOTE: New Reference Range  05/03/14)  Potassium, Serum 4.5 (3.5-5.1 NOTE: New Reference Range  05/03/14)  Chloride, Serum 108 (101-111 NOTE: New Reference Range  05/03/14)  CO2, Serum 24 (22-32 NOTE: New Reference Range  05/03/14)  Calcium (Total), Serum  7.8 (8.9-10.3 NOTE: New Reference Range  05/03/14)  eGFR (African American)  51  eGFR (Non-African American)  44 (eGFR values <64m/min/1.73 m2 may be an indication of chronic kidney disease (CKD). Calculated eGFR is useful in patients with stable renal function. The eGFR  calculation will not be reliable in acutely ill patients when serum creatinine is changing rapidly. It is not useful in patients on dialysis. The eGFR calculation may not be applicable to patients at the low and high extremes of body sizes, pregnant women, and vegetarians.)  Anion Gap  5  Lipase  17 (22-51 NOTE: New Reference Range  05/03/14)  Routine Coag:  24-Mar-16 08:45   Prothrombin  18.0 (11.4-15.0 NOTE: New Reference Range  03/25/14)  INR 1.5 (INR reference interval applies to patients on anticoagulant therapy. A single INR therapeutic range for coumarins is not optimal for all indications; however, the suggested range for most indications is 2.0 - 3.0. Exceptions to the INR Reference Range may include: Prosthetic heart valves, acute myocardial infarction, prevention of myocardial infarction, and combinations of aspirin and anticoagulant. The need for a higher or lower target INR must be assessed individually. Reference: The Pharmacology and Management of the Vitamin K  antagonists: the seventh ACCP Conference on Antithrombotic and Thrombolytic Therapy. CZHGDJ.2426Sept:126 (3suppl): [id number] A HCT value >55% may artifactually increase the PT.  In one study,  the increase was an average of 25%. Reference:  "Effect on Routine and Special Coagulation Testing Values of Citrate Anticoagulant Adjustment in Patients with High HCT Values." American Journal of Clinical Pathology 2006;126:400-405.)  Routine Hem:  24-Mar-16 04:05   WBC (CBC)  18.2  RBC (CBC)  3.92  Hemoglobin (CBC)  12.0  Hematocrit (CBC)  37.7  Platelet Count (CBC) 194  MCV 96  MCH 30.7  MCHC 32.0  RDW  12.8  Neutrophil % 90.3  Lymphocyte % 4.2  Monocyte % 5.4  Eosinophil % 0.0  Basophil % 0.1  Neutrophil #  16.4  Lymphocyte #  0.8  Monocyte # 1.0  Eosinophil # 0.0  Basophil # 0.0 (Result(s) reported on 19 May 2014 at 05:13AM.)   Radiology Results: [place]:    23-Mar-16 06:08, US Abdomen Limited Survey   US Abdomen Limited Survey   REASON FOR EXAM:    RUQ/epigastric pain vomiting  COMMENTS:   Body Site: Gallbladder, Liver, Common Bile Duct    PROCEDURE: [place]  - US ABDOMEN LIMITED SURVEY  - May 18 2014  6:08AM     CLINICAL DATA:  Right upper quadrant epigastric pain. Nausea and  vomiting. Symptoms for several hours.    EXAM:  US ABDOMEN LIMITED - RIGHT UPPER QUADRANT    COMPARISON:  None.    FINDINGS:  Gallbladder:  No gallstones or wall thickening visualized. No sonographic Murphy  sign noted.    Common bile duct:    Diameter: 6 mm    Liver:    Parenchyma is difficult to penetrate, suboptimal acoustic window.  Question increased parenchymal echogenicity. No focal lesion  identified.     IMPRESSION:  1. No gallstones or biliary dilatation.  2. Limited hepatic evaluation. There is question of hepatic  steatosis.      Electronically Signed    By: Jeb Levering M.D.    On: 05/18/2014 06:33         Verified By: Rollene Fare. Marisue Humble, M.D.,  Cardiology:    23-Mar-16 10:28, Echo Doppler  Echo Doppler   REASON FOR EXAM:      COMMENTS:       PROCEDURE: [facility] - ECHO DOPPLER COMPLETE(TRANSTHOR)  - May 18 2014 10:28AM     RESULT: Echocardiogram Report    Patient Name:   GERAMY LAMORTE Date of Exam: 05/18/2014  Medical Rec #:  [id number]          Custom1:  Date of Birth:  1940/04/04      Height:       74.0 in  Patient Age:    74 years        Weight:       236.0 lb  Patient Gender: M               BSA:          2.33 m??    Indications: Murmur  Sonographer:    Sherrie Sport RDCS  Referring Phys: Fritzi Mandes, A    Sonographer Comments: Suboptimal apical window.    Summary:   1. Left ventricular ejection fraction, by visual estimation, is 60 to   65%.   2. Normal global left ventricular systolic function.   3. Mild concentric left ventricular hypertrophy.   4.Mildly increased left ventricular septal thickness.   5. Mildly dilated left atrium.   6. Mildly increased left  ventricular posterior wall thickness.  2D AND M-MODE MEASUREMENTS (normal ranges within parentheses):  Left Ventricle:          Normal  IVSd (2D):      1.07 cm (0.7-1.1)  LVPWd (2D):     1.23 cm (0.7-1.1) Aorta/LA:                  Normal  LVIDd (2D):     4.47 cm (3.4-5.7) Aortic Root (2D): 3.30 cm (2.4-3.7)  LVIDs (2D):     3.21 cm  Left Atrium (2D): 4.30 cm (1.9-4.0)  LV FS (2D):     28.2 %   (>25%)  LV EF (2D):     54.6 %   (>50%)                                    Right Ventricle:                                    RVd (2D):        9.14 cm  LV DIASTOLIC FUNCTION:  MV Peak E: 0.74 m/s E/e' Ratio: 15.90    Decel Time: 99 msec  SPECTRAL DOPPLER ANALYSIS (where applicable):  Mitral Valve:  MV P1/2 Time: 28.71 msec  MV Area, PHT: 7.66 cm??  Aortic Valve: AoV Max Vel: 1.31 m/s AoV Peak PG: 6.9 mmHg AoV Mean PG:  LVOT Vmax: 0.88 m/s LVOT VTI:  LVOT Diameter: 2.10 cm  AoV Area, Vmax: 2.34 cm?? AoV Area, VTI:  AoV Area, Vmn:  Tricuspid Valve and PA/RV Systolic Pressure: TR Max Velocity: 2.05 m/s RA   Pressure: 5 mmHg RVSP/PASP: 21.8 mmHg  Pulmonic Valve:  PV Max Velocity: 1.01 m/s PV Max PG: 4.0 mmHg PV Mean PG:    PHYSICIAN INTERPRETATION:  Left Ventricle: The left ventricular internal cavity size was normal. LV   septal wall thickness was mildly increased. LV posterior wall thickness   was mildly increased. Mild concentric left ventricular hypertrophy.   Global LV systolic function was normal. Left ventricular ejection   fraction, by visual estimation, is 60 to 65%.  Left Atrium: The left atrium is mildly dilated.  Right Atrium: The right atrium is normal in size.  Mitral Valve: The mitral valveis not well seen.  Tricuspid Valve: The tricuspid valve is not well seen. The tricuspid   regurgitant velocity is 2.05 m/s, and with an assumed right atrial   pressure of 5 mmHg, the estimated right ventricular systolic pressure is   normal at 21.8 mmHg.  Aortic Valve: The aortic  valve was not well seen. The aortic valve is   tricuspid. The aortic valve is structurally normal, with no evidence of   sclerosis or stenosis. Trivial aortic valve regurgitation is seen.    [place] MD  Electronically signed by [id number] Bartholome Bill MD  Signature Date/Time: 05/18/2014/12:44:38 PM    *** Final ***  IMPRESSION: .        Verified By: Teodoro Spray, M.D., MD  CT:    23-Mar-16 07:18, CT Angiography Chest/Abd/Pelvis w/wo  CT Angiography Chest/Abd/Pelvis w/wo   REASON FOR EXAM:    chest/abdomen pain h/o AAA  COMMENTS:       PROCEDURE: CT  - CT ANGIOGRAPHY CHEST/ABD/PELVIS  - May 18 2014  7:18AM     CLINICAL DATA:  Midsternal chest pain extending to the left  shoulder. Epigastric pain. Right upper quadrant pain beginning at 1  a.m. today. Short of breath.    EXAM:  CT ANGIOGRAPHY CHEST, ABDOMEN AND PELVIS    TECHNIQUE:  Multidetector CT imaging through the chest, abdomen and pelvis was  performed using the standard protocol during bolus administration of  intravenous contrast. Multiplanar reconstructed images and MIPs were  obtained and reviewed to evaluate the vascular anatomy.    CONTRAST:  80 mL Omnipaque 350    COMPARISON:  Right upper  quadrant abdominal ultrasound 05/18/2014.  CT of the chest without contrast 09/27/2009.    FINDINGS:  CTA CHEST FINDINGS    Noncontrast imaging of the chest demonstrates atherosclerotic  calcifications at the aorta and coronary artery calcifications. The  heart size is normal. No significant pleural or pericardial effusion  is present. There is no significant mediastinal or axillary  adenopathy.    Medial right lower lobe airspace opacification is evident. No  obstructing mass is present. The lungs are otherwise clear.    Bone windows demonstrate fusion of anterior osteophytes across  multiple levels in the thoracolumbar spine with slight exaggerated  kyphosis. No focal lytic or blastic lesions are present.  The  vertebral body heights are normal.    CTA images demonstrate atherosclerotic calcifications in the aortic  arch. There is no dissection or focal vascular injury. The  descending thoracic aorta is unremarkable.    Review of the MIP images confirms the above findings.  CTA ABDOMEN AND PELVIS FINDINGS    Atherosclerotic calcifications are present about the aorta and  branch vessels without aneurysm or stenosis.    The liver and spleen are within normal limits. The gallbladder is  mildly distended with some a pericholecystic stranding.  Nonobstructing stones are present at the gallbladder neck. Stomach,  duodenum, and pancreas are within normal limits. The common bile  duct is dilated at pancreatic head, measuring 10 mm. The adrenal  glands are normal bilaterally. The kidneys are somewhat atrophic. A  16 mm exophytic cyst is present along the medial aspect of the right  kidney. A 10 mm cyst is present at the lower pole of the right  kidney. No discrete mass lesions are present otherwise.  Diverticular changes are present throughout the sigmoid colon. There  is no focal inflammation to suggest diverticulitis. The remainder  the colon is within normal limits. The appendix is visualized and  normal. The small bowel is unremarkable.    No significant adenopathy or free fluid is present.    Bilateral L5 pars defects are associated with grade 1  anterolisthesis. Degenerative changes are noted throughout the  thoracolumbar spine. No focal lytic or blastic lesions are present  otherwise. Moderate spondylosis is present in the lumbar spine.    Review of the MIP images confirms the above findings.     IMPRESSION:  1. 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  2. Posterior medial right lower lobe airspace disease. This is  likely infectious or inflammatory. Recommend follow-up CT chest  without contrast in 3 months to assure resolution.  This area may not  be visible by chest x-ray. This may be the primary etiology of the  patient's symptoms.  3. Distention of the gallbladder with some stranding and stones  layering in the neck. Although the ultrasound was unremarkable, this  could represent developing or early cholecystitis.  4. Dilation of the common bile duct without an obstructing mass.  5. Sigmoid diverticulosis without evidence for diverticulitis.  6. L5 spondylolysis and spondylolisthesis.  7. 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  8. Right-sided renal cysts.      Electronically Signed    By: San Morelle M.D.    On: 05/18/2014 08:06         Verified By: Resa Miner. MATTERN, M.D.,   Assessment/Plan:  Assessment/Plan:  Assessment 1) cholecystitis, cholelithiasis. 2) dilated CBD.  [place] measured cbd at 6 mm, CT measuring at 10 mm, however  minimal lab evidence of biliary obstruction, no evidence of mass or stone in cbd on imaging,  with normal ap and 1.6 t bili.  There is possible cystic duct obstruction with gallstone in gallbladder neck.   Currently patient without pain/on pain meds.  Seen also by Dr Candace Cruise in regard to possible ERCP, however this was not done due to elevated PT, patient having been given lovenox immediately before the exam.   Plan 1) continue current, agree with vitamin K.  Dr Allen Norris will round on patient over the weekend, further evaluation for possible ercp prior to ccy/ioc versus post-op.   Electronic Signatures: Loistine Simas (MD)  (Signed 24-Mar-16 19:36)  Authored: Chief Complaint, VITAL SIGNS/ANCILLARY NOTES, Brief Assessment, Lab Results, Radiology Results, Assessment/Plan   Last Updated: 24-Mar-16 19:36 by Loistine Simas (MD)

## 2014-06-26 NOTE — Consult Note (Signed)
PATIENT NAME:  Jack White, Jack White MR#:  [id number] DATE OF BIRTH:  1940/03/30  DATE OF CONSULTATION:  05/20/2014  REFERRING PHYSICIAN:   CONSULTING PHYSICIAN:  Keturah Barrehristiane H. Nilay Mangrum, NP  REASON FOR CONSULTATION:  GI consult ordered by Dr. Juliann PulseLundquist for evaluation of dilated CBD and elevated bilirubin.   HISTORY OF PRESENT ILLNESS: I appreciate consult for this 74 year old Caucasian man with history of total knee replacement, diabetes, peptic ulcer 2012, arthritis, admitted with epigastric pain, likely with cholecystitis for evaluation of dilated CBD and elevated bilirubin. Bilirubin total is 1.6. States onset of epigastric pain and nausea vomiting last associated with diaphoresis described as a very sharp and excruciating, then presented to the ED.  Initially thought to be cardiac-related. Had evaluation with Dr. Lady GaryFath and was not found to be heart related. CT revealed likely early cholecystitis with gallstones in the gallbladder neck and a common bile duct dilation near the pancreatic head without any definite stones. Bilirubin mildly elevated at 1.6. Transaminases normal.  Had surgical consult with Dr. Juliann PulseLundquist, cholecystectomy is being planned.   Noted also, his white count is up to 18.  He is on Zosyn.  He had a low-grade temperature at some point but is now afebrile. Says he is feeling better with the IV fluids, pain medications, antiemetics.  Some pain and nausea continue, but not as severe.  He gives history of GERD well-controlled on daily Nexium. States no frequent NSAIDs, had a dark stool the other day, but was not actually black.  There has been no hematochezia, problems swallowing or further GI complaints.    PAST MEDICAL HISTORY:  _____ diabetes, osteoarthritis, peptic ulcer disease, shingles, tonsillectomy, right knee arthroscopy, left and right knee replacement, surgery of left hand, bilateral cataracts.   HOME MEDICATIONS: ASA 81 mg daily, diphenhydramine 25 mg daily p.r.n. allergies,  doxazosin 2 mg b.i.d., melatonin 5 mg at bedtime, metformin 1000 mg b.i.d., Novolin, Protonix 40 mg b.i.d., acetaminophen 2 tabs every 6 hours p.r.n., _____ Mylanta, milk of magnesia and Celebrex on his med list he and his family report currently that he is not taking now.   ALLERGIES:  TOMATOES.   FAMILY HISTORY: Significant for gallbladder disease in his daughter and sister. No colorectal cancer or ulcers or other liver disease or colon polyps, notable also for diabetes, heart disease and abdominal aneurysm.   SOCIAL HISTORY: Lives at home with wife. No smoking, illicits or alcohol.   REVIEW OF SYSTEMS:  Ten systems reviewed. Significant for dry cough, had mild URI last week, which he has recovered from; otherwise unremarkable.   LABORATORY DATA: Most recent labs: Glucose 154, BUN 17, creatinine 1.52, sodium 137, potassium 4.5, GFR 44, calcium 7.8, lipase 17, total protein 5.8, albumin 2.7, total bilirubin 1.6, ALP 61, AST 20, ALT 12. WBC 18.2, hemoglobin 12, hematocrit 37.7, platelets 194,000.  PT 18, INR 1.5.  Ultrasound did not demonstrate any abnormalities other than hepatic steatosis, CTA with findings as above and some atherosclerosis.   PHYSICAL EXAMINATION:  VITAL SIGNS: Most recent: Temperature 98.4, pulse 107, respiratory rate 16, blood pressure 99/65, oxygen saturation of 92% on room air.  GENERAL: No acute distress, alert, awake, appears comfortable.  HEENT:  Normocephalic, atraumatic. Sclerae anicteric. Conjunctivae pink.  NECK: Supple. No JVD or lymphadenopathy.  CHEST: Respirations eupneic.  LUNGS:  Clear, somewhat diminished at the bases.  CARDIAC: S1, S2, RRR. No MRG.  ABDOMEN: Obese abdomen. Bowel sounds x 4. Soft.  Minimal tenderness above on the epigastric area. No guarding, rigidity,  peritoneal signs, hepatosplenomegaly or masses.  SKIN: Warm, dry, pink. No erythema, lesion or rash.  MUSCULOSKELETAL: No clubbing or cyanosis. MAEW x 4.  NEUROLOGIC: Alert, oriented x 3.  Cranial nerves II through XII intact. Speech clear. No facial droop.  PSYCHIATRIC: Pleasant, calm, cooperative.   IMPRESSION AND PLAN: Possible choledocholithiasis although not definite, minimal elevation of bilirubin.   I did discuss this patient with Dr. Bluford Kaufmann, Dr. Marva Panda and Dr. Michela Pitcher.  Unable to have ERCP today as he has had heparin recently.  There is also a concern of his elevated PT, which may need correction, as there is no definite evidence of common bile duct stone and his bilirubin is minimally elevated and transaminases are normal. It is possible that he could have his cholecystectomy with cholangiogram and ERCP as indicated afterwards.  We will follow clinically.   Thank you very much for this consult. These services were provided by Vevelyn Pat, MSN, [facility], in collaboration with Barnetta Chapel, MD with whom I discussed this patient in full.       ____________________________ Keturah Barre, NP chl:DT D: 05/20/2014 12:32:39 ET T: 05/20/2014 12:53:04 ET JOB#: [id number]  cc: Keturah Barre, NP, <Dictator> Eustaquio Maize Tru Rana FNP ELECTRONICALLY SIGNED 05/27/2014 16:06

## 2014-06-26 NOTE — Discharge Instructions (Signed)
°DISCHARGE INSTRUCTIONS TO PATIENT ° °REMINDER:  °· Carry a list of your medications and allergies with you at all times °· Call your pharmacy at least 1 week in advance to refill prescriptions °· Do not mix any prescribed pain medicine with alcohol °· Do not drive any motor vehicles while taking pain medication. °· Take medications with food.  Do not retake a pain medication if you vomit after taking it. ° °Activity: no lifting more than 15 pounds ° ° °Follow-up appointments (date to return to physician): °[id number] ° °Call Surgeon if you have: °· Temperature greater than 100.4 °· Persistent nausea and vomiting °· Severe uncontrolled pain °· Redness, tenderness, or signs of infection (pain, swelling, redness, odor or green/yellow discharge around the site) °· Difficulty breathing, headache or visual disturbances °· Hives °· Persistent dizziness or light-headedness °· Extreme fatigue °· Any other questions or concerns you may have after discharge ° °In an emergency, call 911 or go to an Emergency Department at a nearby hospital ° °Diet: ° Resume your usual diet.  Avoid spicy, greasy or heavy foods.  If you have nausea or vomiting, go back to liquids.  If you cannot keep liquids down, call your doctor.  Avoid alcohol consumption while on prescription pain medications. Good nutrition promotes healing. Increase fiber and fluids.  ° ° ° °I understand and acknowledge receipt of the above instructions.  ° ° °                                                                                                                                    °Patient or Guardian Signature                                                                    Date/Time ° ° °                                                                                                                                     °Physician's or R.N.'s Signature                                                                    Date/Time ° °The discharge instructions have  been reviewed with the patient and/or Family Member/Parent/Guardian.  Patient and/or Family Member/Parent/Guardian signed and retained a printed copy. ° °

## 2014-06-26 NOTE — Consult Note (Signed)
PATIENT NAME:  Jack White, Jack White MR#:  [id number] DATE OF BIRTH:  06/21/1940  DATE OF CONSULTATION:  06/22/2014  REFERRING PHYSICIAN:  Cristal Deerhristopher A. Lundquist, MD CONSULTING PHYSICIAN:  Candace Cruiseavid White. Anne HahnWillis, MD  REASON FOR CONSULT: Hyperkalemia.   HISTORY OF PRESENT ILLNESS: This is a 74 year old male who presents to the ED today and is admitted on the surgical service for pain around his surgical incision as well as nausea and vomiting and some dehydration. This patient has had a number of abdominal surgeries recently; it started with cholecystectomy and then he ended up having colonic perforation with colonic resection and then he developed an abdominal abscess at the gallbladder site and had to have drained surgically. He presents today again with this and nausea and vomiting and pain, vomiting leading to dehydration, and so is being admitted to the surgical service for the same. The patient's daughter is with the patient in the ED. She also states he has been having a fair amount of insomnia recently and significant difficulty sleeping and requests something for that as well The hospitalists were consulted due to hyperkalemia. The patient's potassium was found to be 5.8 in the ED. Of note, he did not have any significant EKG changes with this.   PRIMARY CARE PHYSICIAN: Don C. Chaplin, MD  PAST MEDICAL HISTORY: Skin cancer, myocardial infarction, diabetes mellitus, abdominal aortic aneurysm, arthritis, benign prostatic hypertrophy, diverticulitis.   MEDICATIONS: Protonix 40 mg b.i.d., NovoLog sliding scale insulin, metformin 1000 b.i.d., melatonin 5 mg at bedtime, Ensure supplements, Lovenox 40 mg subcutaneous daily, doxazosin 2 mg b.i.d., aspirin 81 mg daily, alprazolam 0.5 mg at bedtime, Fioricet p.r.n. for headache,  Norco at 10/325 mg p.r.n. pain.   PAST SURGICAL HISTORY: As listed per HPI, cholecystectomy, colonic resection, abdominal abscess drain.   ALLERGIES: DILAUDID AND TOMATO.   FAMILY  HISTORY: CAD, diabetes mellitus, Alzheimer's, aortic aneurysm.   SOCIAL HISTORY: Nonsmoker, nondrinker. Denies illicit drug use.   REVIEW OF SYSTEMS:  CONSTITUTIONAL: Denies fever. Endorses some fatigue and weakness.  EYES: Denies blurred or double vision, pain or redness.  EAR, NOSE, AND THROAT: Denies ear pain, hearing loss, difficulty swallowing.  RESPIRATORY: Denies cough, dyspnea, painful respiration.  CARDIOVASCULAR: Denies chest pain, edema, palpitations.  GASTROINTESTINAL: Endorses nausea, vomiting, diarrhea. Endorses abdominal pain. Denies constipation.  GENITOURINARY: Denies dysuria, hematuria, or frequency.  ENDOCRINE: Denies nocturia, thyroid problems, heat or cold intolerance.  HEMATOLOGIC AND LYMPHATIC: Denies easy bruising, bleeding, swollen glands.  INTEGUMENTARY: Denies acne, rash, or lesion.  MUSCULOSKELETAL: Denies acute arthritis, joint swelling, or gout.  NEUROLOGICAL: Denies numbness, weakness, headache.  PSYCHIATRIC: Denies anxiety, insomnia, depression.   PHYSICAL EXAMINATION:  VITAL SIGNS: Blood pressure 125/67, pulse 93 temperature 97.8, respirations 21 with 95% oxygen saturation on room air.  GENERAL: This is an elderly gentleman lying supine in bed in no acute distress.  HEENT: Pupils equal, round, and reactive to light and accommodation. Extraocular movements intact. No scleral icterus. Dry mucosal membranes.  NECK: Thyroid is not enlarged. Neck is supple. No masses, nontender. No cervical adenopathy. No JVD.  RESPIRATORY: Clear to auscultation bilaterally with no rales, rhonchi, or wheezing. No respiratory distress.  CARDIOVASCULAR: Regular rate and rhythm with no murmurs, rubs, or gallops. Good pedal pulses. No lower extremity edema.  ABDOMEN: Soft, mildly tender with multiple recent surgical incision well healing as well as ostomy site. He has hypoactive bowel sounds.  MUSCULOSKELETAL: Muscular strength 5/5 in all 4 extremities. Full spontaneous range of  motion throughout. No cyanosis or clubbing.  SKIN: No rash or lesions. Skin is warm, dry, and intact.  LYMPHATIC: No adenopathy.  NEUROLOGICAL: Cranial nerves intact. Sensation intact throughout. No dysarthria or aphasia.  PSYCHIATRIC: The patient is sleepy but arousable. He is oriented, cooperative, not agitated.   LABORATORY DATA: White count 6.2, hemoglobin 10.8, hematocrit 32.6, platelets 240,000. Sodium 135, potassium 5.8, chloride 100, bicarbonate 25, BUN 45, creatinine 2.05, glucose 145, calcium 8.5. Total protein 8.5, albumin 2.8, total bilirubin 0.4, alkaline phosphatase 96, AST 28, ALT 24. Troponin less than 0.03. Clostridium difficile negative. Urinalysis negative.   RADIOLOGIC DATA: CT of the abdomen and pelvis: No acute abdominal process to account for the patient's symptoms.   ASSESSMENT AND PLAN: 1.  Hyperkalemia: This is likely secondary to the patient's gastrointestinal issues. On chart review, he did have a recent elevated potassium at 5.4 as well. His potassium is mildly elevated without EKG changes. The patient, however, does have some acute kidney injury, so we will treat this with Kayexalate. We also gave gram of calcium. We will monitor for improvement. He can receive more Kayexalate if needed.  2.  Acute kidney injury: This is likely prerenal, likely due to the patient's dehydration. Would agree with fluid resuscitation on this patient, which has currently been ordered by the primary team. We will follow his creatinine level with routine labs.  3.  Insomnia: The patient and his daughter are recommending something to help him sleep, stating at the skilled facility he was at he was getting 0.5 mg Xanax at bedtime to help him sleep. We will continue this here as well as a little bit of Benadryl that has been ordered by his primary team.   CODE STATUS: This patient is full code.   TIME SPENT ON THIS CONSULT: 45 minutes.    ____________________________ Candace Cruise. Anne Hahn,  MD dfw:bm D: 06/23/2014 02:46:00 ET T: 06/23/2014 07:30:11 ET JOB#: [id number]  cc: Candace Cruise. Anne Hahn, MD, <Dictator> Siriah Treat Scotty Court MD ELECTRONICALLY SIGNED 06/23/2014 21:03

## 2014-06-26 NOTE — Consult Note (Signed)
Chief Complaint:  Subjective/Chief Complaint delayed entry, seen earlier this am. in process of extubation, would not follow commands.   VITAL SIGNS/ANCILLARY NOTES: **Vital Signs.:   28-Mar-16 10:00  Vital Signs Type Routine  Pulse Pulse 87  Respirations Respirations 22  Systolic BP Systolic BP 108  Diastolic BP (mmHg) Diastolic BP (mmHg) 59  Mean BP 75  Pulse Ox % Pulse Ox % 97  Pulse Ox Activity Level  At rest  Oxygen Delivery Ventilator Assisted   Brief Assessment:  Cardiac Regular   Respiratory clear BS   Gastrointestinal details normal moderate to mild distension, poor bowel sounds/distant.   Lab Results: Thyroid:  28-Mar-16 05:06   Thyroxine, Free 0.92 (0.61-1.12 NOTE: New Reference Range:  05/03/14)  Lab:  28-Mar-16 04:00   pH (ABG)  7.470 (7.350-7.450 NOTE: New Reference Range 09/18/13)  PCO2  29 (32-48 NOTE: New Reference Range 10/05/13)  PO2 90 (83-108 NOTE: New Reference Range 09/18/13)  FiO2 30  Base Excess -2 (-3-3 NOTE: New Reference Range 10/05/13)  HCO3 21.1 (21.0-28.0 NOTE: New Reference Range 09/18/13)  Specimen Site (ABG) RT RADIAL  Specimen Type (ABG) ARTERIAL  PEEP 5.0  O2 Saturation 96.9  Patient Temp (ABG) 37.0  Vt 550  Mechanical Rate 24 (Result(s) reported on 23 May 2014 at 04:50AM.)    10:25   pH (ABG) 7.380 (7.350-7.450 NOTE: New Reference Range 09/18/13)  PCO2 35 (32-48 NOTE: New Reference Range 10/05/13)  PO2  82 (83-108 NOTE: New Reference Range 09/18/13)  FiO2 30  Base Excess  -4 (-3-3 NOTE: New Reference Range 10/05/13)  HCO3  20.7 (21.0-28.0 NOTE: New Reference Range 09/18/13)  Specimen Site (ABG) RT RADIAL  Specimen Type (ABG) ARTERIAL  Mode SPONTANEOUS  PSV 8  PEEP 5.0 (Result(s) reported on 23 May 2014 at 10:35AM.)  Routine Chem:  28-Mar-16 05:06   Magnesium, Serum 1.8 (1.7-2.4 THERAPEUTIC RANGE: 4-7 mg/dL TOXIC: > 10 mg/dL  ----------------------- NOTE: New Reference Range  05/03/14)  Phosphorus, Serum   1.9 (2.5-4.6 NOTE: New Reference Range  05/03/14)  Glucose, Serum  196 (65-99 NOTE: New Reference Range  05/03/14)  BUN  23 (6-20 NOTE: New Reference Range  05/03/14)  Creatinine (comp)  1.34 (0.61-1.24 NOTE: New Reference Range  05/03/14)  Sodium, Serum 139 (135-145 NOTE: New Reference Range  05/03/14)  Potassium, Serum 3.8 (3.5-5.1 NOTE: New Reference Range  05/03/14)  Chloride, Serum  112 (101-111 NOTE: New Reference Range  05/03/14)  CO2, Serum 23 (22-32 NOTE: New Reference Range  05/03/14)  Calcium (Total), Serum  7.2 (8.9-10.3 NOTE: New Reference Range  05/03/14)  Anion Gap  4  eGFR (African American)  60  eGFR (Non-African American)  51 (eGFR values <60mL/min/1.73 m2 may be an indication of chronic kidney disease (CKD). Calculated eGFR is useful in patients with stable renal function. The eGFR calculation will not be reliable in acutely ill patients when serum creatinine is changing rapidly. It is not useful in patients on dialysis. The eGFR calculation may not be applicable to patients at the low and high extremes of body sizes, pregnant women, and vegetarians.)    05:35   Triglycerides, Serum  255 (0-149 NOTE: New Reference Range  05/03/14)  Routine Hem:  28-Mar-16 05:06   WBC (CBC)  14.6  RBC (CBC)  3.26  Hemoglobin (CBC)  10.2  Hematocrit (CBC)  30.9  Platelet Count (CBC) 178  MCV 95  MCH 31.2  MCHC 32.9  RDW 13.5  Neutrophil % 90.8  Lymphocyte % 3.2  Monocyte %   4.9  Eosinophil % 0.8  Basophil % 0.3  Neutrophil #  13.3  Lymphocyte #  0.5  Monocyte # 0.7  Eosinophil # 0.1  Basophil # 0.0 (Result(s) reported on 23 May 2014 at 05:55AM.)   Radiology Results: CT:    28-Mar-16 12:17, CT Head Without Contrast  CT Head Without Contrast   REASON FOR EXAM:    AMS  COMMENTS:       PROCEDURE: CT  - CT HEAD WITHOUT CONTRAST  - May 23 2014 12:17PM     CLINICAL DATA:  Postop abdominal surgery. Extubated. Altered mental  status.    EXAM:  CT HEAD  WITHOUT CONTRAST    TECHNIQUE:  Contiguous axial images were obtained from the base of the skull  through the vertex without intravenous contrast.  COMPARISON:  MR brain 11/06/2011    FINDINGS:  There is no evidence of mass effect, midline shift, or extra-axial  fluid collections. There is no evidence of a space-occupying lesion  or intracranial hemorrhage. There is no evidence of a cortical-based  area of acute infarction. There is generalized cerebral atrophy.  There is periventricular white matter low attenuation likely  secondary to microangiopathy.    The ventricles and sulci are appropriate for the patient's age. The  basal cisterns are patent.    Visualized portions of the orbits are unremarkable. The visualized  portions of the paranasal sinuses and mastoid air cells are  unremarkable.    The osseous structures are unremarkable.     IMPRESSION:  1.   1.  No acute intracranial pathology.  2. Chronic microvascular disease and cerebral atrophy.  2.      Electronically Signed    By: Kathreen Devoid    On: 05/23/2014 12:25       Verified By: Jennette Banker, M.D., MD   Assessment/Plan:  Assessment/Plan:  Assessment 1) s/p ccy for acute cholecystitis, no IOC due to clinical situation.  events noted, now off vent, some disorientation noted and CT head showing no acute changes. no lfts today.   Plan 1) lfts tomorrow. doubt choledocholithiasis from pre-op labs and imaging, await findings of labs.   Electronic Signatures: Loistine Simas (MD)  (Signed 28-Mar-16 16:23)  Authored: Chief Complaint, VITAL SIGNS/ANCILLARY NOTES, Brief Assessment, Lab Results, Radiology Results, Assessment/Plan   Last Updated: 28-Mar-16 16:23 by Loistine Simas (MD)

## 2014-06-26 NOTE — Progress Notes (Signed)
Patient ID: Jack SayresWilliam F White, male   DOB: [birth year], 74 y.o.   MRN: [id number] Surgery  No c/o's eating well Main issue has been loose ostomy output Filed Vitals:   06/24/14 1229 06/26/14 0636 06/26/14 0949 06/26/14 1321  BP:  111/90 114/61 113/58  Pulse:  84 109 100  Temp:  97.7 F (36.5 C) 97.9 F (36.6 C) 98.2 F (36.8 C)  TempSrc:  Oral Oral Oral  Resp:  18    Height: 6' (1.829 m)     Weight: 95.255 kg (210 lb)     SpO2:  98% 96% 97%    CBC Latest Ref Rng 06/25/2014 06/24/2014 06/23/2014  WBC 3.8-10.6 x10 3/mm 3 5.8 - 5.5  Hemoglobin 13.0-18.0 g/dL 8.2(N8.4(L) - 9.6(L)  Hematocrit 40.0-52.0 % 25.5(L) - 29.5(L)  Platelets 150-440 x10 3/mm 3 169 179 209   BMP 06/25/2014 06/24/2014 06/23/2014  Glucose - - -  BUN 17 23(H) 36(H)  Creatinine 1.18 1.43(H) 1.78(H)  Sodium - - -  Potassium - - -  Chloride - - -  CO2 24 24 26   Calcium 7.8(L) 7.7(L) 8.2(L)   PE  Loose ostomy output.  Alert and oriented. abd soft Wound intact.staples to be removed.  IMp doing well  [facility].

## 2014-06-26 NOTE — Consult Note (Signed)
PATIENT NAME:  Jack White, Jack White MR#:  [id number] DATE OF BIRTH:  04/20/1940  DATE OF CONSULTATION:  05/24/2014  REFERRING PHYSICIAN:   CONSULTING PHYSICIAN:  Pauletta BrownsYuriy Maraki Macquarrie, MD  REASON FOR CONSULTATION:  Altered mental status.  HISTORY OF PRESENT ILLNESS:  A 74 year old gentleman with a history of osteoporosis, gastroesophageal reflux disease, diabetes admitted for cholecystitis status post surgery on 05/21/2014 after which patient had agonal breathing and code was called.  The patient was intubated, found to have acute hypoxic hypercarbic respiratory failure. The patient is status post extubation March 28 and patient has been confused since.    LABORATORY WORKUP:   Reviewed.  CAT scan of the head, no acute intracranial abnormality.   NEUROLOGIC EVALUATION: The patient does not follow any commands, does track me through the room. Responds to visual threats. Withdraws from painful stimuli all his extremities. No focal deficits on one side compared to the other. Reflexes diminished. Coordination and gait could not be assessed.   IMPRESSION: A 74 year old gentleman status post cholecystitis status post surgery on March 26, intubation during code, extubated March 28.  Since that time, has been confused, disoriented.  On neurologic evaluation, I do not see any focal deficits. I do not think this is seizure activity.  As per nursing staff, the patient's mental status is actually improving.  Likely a combination of metabolic encephalopathy/ICU delirium.  Elevated white count of 16.8 today. Looking for source for infection.   PLAN: I think this is Intensive Care Unit delirium/metabolic encephalopathy. No further imaging from a neurological standpoint.  Evaluate patient during night.  Make sure he sleeps throughout the night, open the blinds during the day to obtain the day wake cycle. Please hold any benzodiazepines.  Typical antipsychotics would be preferred for agitation.   Thank you. It was a pleasure  seeing this patient. Please call with any questions.    ____________________________ Pauletta BrownsYuriy Travis Mastel, MD yz:sp D: 05/24/2014 20:12:55 ET T: 05/25/2014 10:35:38 ET JOB#: [id number]  cc: Pauletta BrownsYuriy Melquan Ernsberger, MD, <Dictator> Pauletta BrownsYURIY Tameah Mihalko MD ELECTRONICALLY SIGNED 05/31/2014 15:50

## 2014-06-26 NOTE — Op Note (Signed)
PATIENT NAME:  Jack White, Jack White MR#:  [id number] DATE OF BIRTH:  1940-09-17  DATE OF PROCEDURE:  05/21/2014  PREOPERATIVE DIAGNOSIS: Acute cholecystitis.   POSTOPERATIVE DIAGNOSIS: Acute gangrenous cholecystitis with perforation.   ANESTHESIA: General.   SURGEON: Quentin Orealph L. Ely, MD.   OPERATIVE PROCEDURE: Laparoscopic cholecystectomy.  DESCRIPTION OF PROCEDURE: With the patient in the supine position, after the induction of appropriate general anesthesia, the abdomen was prepped with ChloraPrep and sterile towels. The patient was placed in the head down, feet up position. A small infraumbilical incision was made in the standard fashion and carried down bluntly through the subcutaneous tissue. A Veress needle was used to cannulate the peritoneal cavity. CO2 was insufflated to appropriate pressure measurements. When approximately 2.5 liters of CO2 were instilled, the Veress needle was withdrawn and an 11 mm [employer] port was inserted into the peritoneal cavity. Appropriate visualization was confirmed. CO2 was reinsufflated. The patient was placed in the head up, feet down position and rotated slightly to the left side.   A subxiphoid transverse incision was made 11 mm port was inserted under direct vision. Two lateral ports, 5 mm in size, were inserted under direct vision. Attention was turned to the right upper quadrant. The omentum was stuck up above the liver with a large pocket of purulence. When that omentum was pulled off the anterior abdominal wall, a large amount of pus drained from that area. The gallbladder was necrotic with perforation in the dome of the gallbladder and pus and bile spilling from the gallbladder. The 2 lateral ports were exchanged for 11 mm ports and a fan retractor placed. Good exposure could not be accomplished in that fashion, so a left mid abdomen 11 mm port was placed and the fan retractor placed through that area. Dissection was then carried out along the  gallbladder hepatoduodenal ligament.   There was significant bleeding from the gallbladder from the omentum. There was severe edema and the gallbladder was quite necrotic. Tedious dissection was required to expose the cystic duct, which was doubly clipped and divided. The cystic artery was identified, clipped and divided. The gallbladder was then bluntly dissected from the bed and the liver using the suction apparatus. It was captured in the Endo Catch apparatus and removed through the subxiphoid incision. The area was copiously irrigated with 3 liters of warm saline solution. There still was significant oozing from the liver edge and the liver bed, although no single point of bleeding could be identified. The omentum was reviewed and no gross bleeding site was identified. Two applications of Avitene were placed in the bed of the liver.   Two big drains were placed; 1 to the left abdomen into the subhepatic space and 1 from the right abdomen up over the liver. The abdomen was then desufflated. The upper midline incision was closed with figure-of-8 suture of 0 Vicryl. The skin incisions were closed with 3-0 and 5-0 nylon. The drains secured with 3-0 nylon. The area infiltrated with 0.25% Marcaine for postoperative pain control and sterile dressings applied. The patient was returned to the recovery room in stable but critical condition.      ____________________________ Quentin Orealph L. Ely III, MD rle:TT D: 05/21/2014 10:22:58 ET T: 05/21/2014 13:48:44 ET JOB#: [id number]  cc: Quentin Orealph L. Ely III, MD, <Dictator> Quentin OreALPH L ELY MD ELECTRONICALLY SIGNED 05/24/2014 20:50

## 2014-06-26 NOTE — Consult Note (Signed)
Brief Consult Note: Diagnosis: epigastric pain.   Patient was seen by consultant.   Consult note dictated.   Comments: Appreciate consult for 73yo caucasian man with hx TKR, DM, PUD2012, arthritis, admitted with epigastric pain- likely with cholecystitis, for evaluation of dilated CBD and elevated bilirubin. States onset of epigastric pain, NV night before last, presented to ED- intiallly thought to be cardiac related, had eval with Dr Lady GaryFath and not heart related. CT revealed likely early cholecystitis and CBD dilation near pancreatic head. Bilirubin mildly elevated at 1.6, transaminases normal.  Had surgical consult with Dr Juliann PulseLundquist cholecystectomy is being planned. Noted also wbc up to 18 and is on Zosyn. States he is feeling better with the IVY, pain meds, antiemetics, some pain and nausea continue but not as severe. Gives hx GERD well controlled on daily Nexium. No sig NSAIDs. Had a dark stool the other day but no real melena/ hematochezia, problems swallowing, further GI complaints.  Impression/plan: possible choledocholithiasis. Discussed ERCP with patient, family and rationale- they are agreeable if necessary, had questions about more than 1 sedated procedure: did discuss with Dr Oh/Skulksie/Ely- he cant have proc today as he has had heparin. Theres also a concern of >Pt which may need correction- so as there is no definite evidence of CBD stone and his bili is only sl >/transaminases normal, it is possible that he could have CCy with cholangiogram, and ercp as indicated. will follow clinically.  Electronic Signatures: Vevelyn PatLondon, Monea Pesantez H (NP)  (Signed 24-Mar-16 20:07)  Authored: Brief Consult Note   Last Updated: 24-Mar-16 20:07 by Keturah BarreLondon, Jadarious Dobbins H (NP)

## 2014-06-26 NOTE — Consult Note (Signed)
   Present Illness 74 yo male with no history of cad or other cardiac disease who was admitted after presenting to the eer with mid epigastric pain. EKG was unremarkable. He has ruled out for an mi. Echo revealed normal lv funciton with no wall motion abnormality or valvular disease. Abdominal u/s revealed no significant gall stones but abdominal ct scan revealed gall bladder duct dilitations with probable early cholecystitis. There were calcifations in the wall of the aorta and coronary arteries. Pt has been fairly active with no exertional chest pain or sob.   Physical Exam:  GEN obese   HEENT PERRL   NECK supple   RESP normal resp effort  clear BS   CARD Regular rate and rhythm  No murmur   ABD positive tenderness   EXTR negative cyanosis/clubbing, negative edema   SKIN normal to palpation   NEURO cranial nerves intact, motor/sensory function intact   PSYCH A+O to time, place, person   Review of Systems:  Subjective/Chief Complaint epigastric pain worse with deep palpation   General: No Complaints   Skin: No Complaints   ENT: No Complaints   Eyes: No Complaints   Neck: No Complaints   Respiratory: No Complaints   Cardiovascular: No Complaints   Gastrointestinal: Heartburn  epigastric pain   Genitourinary: No Complaints   Vascular: No Complaints   Musculoskeletal: No Complaints   Neurologic: No Complaints   Hematologic: No Complaints   Endocrine: No Complaints   Psychiatric: No Complaints   Review of Systems: All other systems were reviewed and found to be negative   Medications/Allergies Reviewed Medications/Allergies reviewed   Family & Social History:  Family and Social History:  Family History Coronary Artery Disease   Place of Living Home   EKG:  EKG NSR   Abnormal NSSTTW changes    Tomato: GI Distress, Resp. Distress   Impression 74 yo male with no prior cardiac history with history of aortic aneurysm now with abdominal and  epigastric pain. Abdominal ct revealed no dissection or aneurysm and revealed probable early cholecystitis. Calcification in aorta and coronaries. Ruled out for mi. Echo normal with normal lv funciton and no regional wall motion abnormality.. Pain is reproducible by deep palpation. Does not appear to be ischemic. Appears to be at low to moderate risk from cardiac standpoint and appears well optimized. Has had orthopedic surgery in the recent past with no problems.   Plan 1. Probable gall bladder etiology of chest pain.  2. PRoceed with gi/gen surgery evaluation 3. Would consider low dose beta blocker therapy perioperatively as pressure tolerates. Currently relatively hypotensive so would defer at present 4. Would proceed with surgery if needed  5.  WIll follow with you.   Electronic Signatures: Dalia HeadingFath, Jessilynn Taft A (MD)  (Signed 23-Mar-16 13:18)  Authored: General Aspect/Present Illness, History and Physical Exam, Review of System, Family & Social History, EKG , Allergies, Impression/Plan   Last Updated: 23-Mar-16 13:18 by Dalia HeadingFath, Janan Bogie A (MD)

## 2014-06-26 NOTE — Consult Note (Signed)
Brief Consult Note: Diagnosis: hypoglycemia in diabetic , nausea, malnutrition, hyperkalemia, h/o gangrenous cholecystitis, s/p cholecystectomy, colon perforation, s/p colectomy.   Patient was seen by consultant.   Consult note dictated.   Recommend further assessment or treatment.   Orders entered.   Discussed with Attending MD.   Comments: 1. Hypoglycemia in diabetic, hold insulin lantus, continue  D5W just until midnight tonight, then dc  2. diabetes, po intake is still poor, dc lantus , continue diabetic diet,  SSI 3. hyperkalemia, dc IVF with potassium, recheck later today 4.nausea, zofran, phenergan 5. s/p cholecystectomy, s/p colectomy, treatment, follwo up as per surgery 6 generalized weakness, PT, rehab placement  Thanks for consult, will follow.  Electronic Signatures: Katharina CaperVaickute, Thresa Dozier (MD)  (Signed 21-Apr-16 12:40)  Authored: Brief Consult Note   Last Updated: 21-Apr-16 12:40 by Katharina CaperVaickute, Kaelob Persky (MD)

## 2014-06-26 NOTE — Consult Note (Addendum)
PATIENT NAME:  Jack White, Jack White MR#:  [id number] DATE OF BIRTH:  06/20/1940  DATE OF CONSULTATION:  06/16/2014  REFERRING PHYSICIAN:   CONSULTING PHYSICIAN:  Katharina Caperima Khoi Hamberger, MD  REASON FOR CONSULTATION:  Consult was requested in regard to hypoglycemia today on 06/16/2014.   ADMITTING DIAGNOSIS: Epigastric abdominal pain radiating to the chest, concerning for a chest injury.    HISTORY OF PRESENT ILLNESS:  The patient is a 74 year old Caucasian male with past medical history significant for history of diabetes mellitus, history of gastroesophageal reflux disease, osteoarthritis, who presented to the hospital on 05/18/2014 with epigastric abdominal pains radiating to the chest. He was admitted to the hospital for further evaluation, consultation with surgery, cardiology, as well as gastroenterology was obtained. The patient was diagnosed with acute gangrenous cholecystitis and was taken to surgery. Surgery was performed on 05/21/2014. The patient had gangrenous cholecystitis with perforation, cholecystectomy was performed under general anesthesia. The patient had 2 JP drains placed. Postsurgery he developed respiratory arrest, he had a central line placed on 05/21/2014 and taken to the critical care unit. 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 Post procedure he did relatively well, he improved, he was however managed on TPN and now his TPN is off for the past 2 days and he developed hypoglycemia. His blood glucose levels are in the 70s intermittently. He feels hypoglycemic just prior to meals with shakiness as well as tremor and anxiety.  He was initiated on D5 water, which improved his blood glucose levels. Hospital services were contacted for hypoglycemia evaluation. The  patient still remains on insulin Lantus 25 units subcutaneously daily, which was initiated when the patient was on TPN. The patient's appetite remains poor. He eats approximately 20-50% of his meals. Admits to drinking plenty of fluids. Otherwise he feels quite okay. He is able to move bowels well. He is being evaluated by physical therapist and recommended to rehabilitation because of his generalized weakness where he will be going  likely tomorrow.   PAST MEDICAL HISTORY: Significant for history of recent diagnosis of acute gangrenous cholecystitis with perforation status post cholecystectomy 05/21/2014, status post 2 JP drains placed, respiratory arrest postoperatively, and central line placement on 05/21/2014, colon perforation with colocutaneous fistula which was treated initially with antibiotics and TPN, later on partial colectomy was performed as well as ascending colostomy on 05/28/2014, history of diabetes mellitus, acute renal failure during this admission due to ATN, history of sinus tachycardia, history of malnutrition, acute blood loss anemia, gastroesophageal reflux disease, osteoarthritis. Past medical history also significant for history of shingles, peptic ulcer disease.   PAST SURGICAL HISTORY: Also tonsillectomy, right knee arthroscopy surgery, surgery left hand, total hip replacement on the right, total knee replacement on the right, and bilateral cataracts.   ALLERGIES:  TOMATO.    MEDICATIONS:  In the hospital the patient was on insulin Lantus at 25 units subcutaneously daily, Tylenol suppository, Lovenox injection, Percocet, sliding scale insulin, morphine, Maalox suspension, Pepcid, promethazine, sodium chloride through nephrostomy tube, alprazolam, Zofran, and Fioricet.  At home the patient was on aspirin 81 mg p.o. daily, Celebrex 200 mg twice daily, diphenhydramine 25 mg daily as needed for allergies, doxazosin 2 mg twice daily, melatonin 5 mg p.o. at bedtime, metformin 1 gram  twice daily, milk of magnesia 30 mL twice daily as needed,  Mylanta 30 mL every 6 hours as needed, Novolin R 4 times daily as needed according to the sliding scale, Protonix 40 mg twice daily, Tylenol 500 mg 2 tablets every 6 hours as needed.   FAMILY HISTORY:  Diabetes in the patient's sister. Mother had heart disease. Father had an abdominal aneurysm.   SOCIAL HISTORY:  Lives at home with his wife. Nonsmoker. Denies any alcohol abuse.   REVIEW OF SYSTEMS:  CONSTITUTIONAL:  Denies any fevers, chills, fatigue, weakness.  Admits to having no  weight loss. Admits to having some sinus congestion, seasonal allergies.  Admits to some cough with clear yellowish phlegm production for the past few weeks. Denies any tobacco use in the past. Admits to significant nausea intermittent with vomiting, vomited twice over the past few days. Admits to bowel movements in the colostomy bag.  No fevers, chills, fatigue, weakness, except as mentioned.  EYES: Denies any blurry vision, double vision, glaucoma. Admits of cataract removal in the past.  EARS, NOSE, AND THROAT: Denies any tinnitus, allergies, epistaxis, sinus pain, dentures, difficulty swallowing.  RESPIRATORY: Denies any cough wheezes, asthma, COPD.   CARDIOVASCULAR: Denies any chest pains, arrhythmias, palpitations, syncope.   GASTROINTESTINAL:  Admits to nausea. Denies any diarrhea or melena.  GENITOURINARY: Denies dysuria, hematuria, frequency, incontinence.  ENDOCRINE: Denies polydipsia, nocturia, thyroid problems, heat or cold intolerance, or thirst.  HEMATOLOGIC:  Denies anemia, easy bruising, bleeding, or swollen glands.   SKIN:  Denies acne, rashes, lesions, or change in moles.  MUSCULOSKELETAL: Denies arthritis, cramps, swelling.  NEUROLOGIC: Denies numbness, epilepsy, or tremor.  PSYCHIATRIC: Denies anxiety, insomnia, or depression.   PHYSICAL EXAMINATION:  VITAL SIGNS:  During my evaluation the patient's vital signs, temperature 97.7, pulse  was 93, respiration was 18, blood pressure 102/69, saturation was 93-94% on room air at rest.  GENERAL: This is a well-nourished Caucasian male in no significant distress, lying on the stretcher.  HEENT: His pupils are equal and reactive to light. Extraocular movements intact.  No icterus or conjunctivitis.  The patient does have difficulty hearing. No pharyngeal erythema. Mucosa is moist. The patient is edentulous.   NECK:  No masses, supple, nontender. Thyroid not enlarged.  No adenopathy.  No JVD or carotid bruits bilaterally.  Full range of motion.  LUNGS: Clear to auscultation, but significant diminished breath sounds anteriorly. Posteriorly the patient does have crackles at bases. No wheezing. No rales or rhonchi were heard. No dullness to percussion.  ABDOMEN: Soft, nontender to palpation though the patient has significant dressing placed. The patient has midline scar which is healing well. He also has fistula just above colostomy which is in the mid lower part of abdomen.  He has also JP drain on the right side, serosanguinous fluid is noted in that drain.  A few scars were noted underneath of dressing on the right side.  Otherwise no drainage. No other abnormalities were found. Bowel sounds were positive. The patient does not have any stool in his colostomy bag.  NEUROLOGIC:  The patient is intact. His deep tendon reflexes intact. No weakness, numbness, or tingling was felt on evaluation.  EXTREMITIES. No swelling, calf tenderness, or cyanosis. The patient does have mild kyphosis.  NEUROLOGIC: As mentioned above, no significant abnormalities.   PSYCHIATRIC:  The patient is alert, oriented times person, place.  No memory deficits, however the patient does have some difficulty hearing, so is difficult to discern exactly if it is related to memory issue or that he cannot hear me well.  LABORATORY DATA:  EKGs are not available at this time, although the patient had telemetry strips in the recent  past which showed normal sinus rhythm, rate of 88. The patient laboratories, BMP, elevated BUN of 22, glucose 107, otherwise unremarkable study. The patient's CBC, white blood cell count 7.4, hemoglobin 9.4, platelet count 276,000.  Absolute neutrophil count is normal at 4.8.   RADIOLOGIC STUDIES: Most recent radiologic studies are CT of abdomen and pelvis with contrast 06/15/2014 revealing significantly decreased size of perihepatic fluid collection post drain placement, a small fluid collection persists around the drainage catheter measuring 3 x 3 cm, trace bilateral effusions, bilateral atelectasis.   ASSESSMENT AND PLAN:  1.  Hypoglycemia in a diabetic patient who was receiving insulin Lantus. Discontinue insulin Lantus.  Continue D5 water just until midnight and then discontinue.  2.  Diabetes mellitus. P.o. intake is still poor, as mentioned above we will discontinue the Lantus. We will continue diabetic diet as well as sliding scale insulin for now.  3.  Hyperkalemia. We will discontinue IV fluids with potassium and recheck potassium level later today. Give Kayexalate if needed.  4.  Nausea. Continue Zofran as well as Phenergan as you are doing.   5.  Status post cholecystectomy and status post colectomy. Treatment and followup as per surgery.  6.  Generalized weakness.  Physical therapy and rehabilitation placement to follow.   TIME SPENT: 50 minutes.      ____________________________ Katharina Caper, MD rv:bu D: 06/16/2014 12:55:23 ET T: 06/16/2014 13:40:00 ET JOB#: [id number]  cc: Katharina Caper, MD, <Dictator> Rebacca Votaw MD ELECTRONICALLY SIGNED 06/24/2014 18:25

## 2014-06-26 NOTE — Progress Notes (Signed)
For prior documentation please see [facility] medical record number [id number]

## 2014-06-26 NOTE — Op Note (Signed)
PATIENT NAME:  Jack White, Jack White MR#:  [id number] DATE OF BIRTH:  22-Feb-1941  DATE OF PROCEDURE:  05/28/2014  OPERATION PERFORMED:  Exploratory laparotomy, washout and drainage, partial colectomy (hepatic flexure), with ascending colostomy and transverse mucous fistula.   PREOPERATIVE DIAGNOSIS: Perforated hepatic flexure.   POSTOPERATIVE DIAGNOSIS: Perforated hepatic flexure.   ANESTHESIA: General.   FINDINGS: Approximately 200 mL of stool contained to the right upper quadrant, perforation was approximately 1 cm.   SURGEON: Jack Manges, MD   ESTIMATED BLOOD LOSS: 250 mL.   PROCEDURE IN DETAIL: The patient was prepped and draped in the usual sterile fashion, and an upper midline incision was made and carried down through the linea alba with the electrocautery, and the peritoneum was entered. All of this stool was contained in the right upper quadrant (although a little bit of it was spread during the washout). The left-sided Jackson-Pratt drain (which had been draining the stool) was removed, and the right-sided Jackson-Pratt drain was left in place at the skin, but was repositioned at the end of the operation to [facility] pouch. The stool was drained and the right upper quadrant was initially irrigated with warm normal saline in an attempt to maintain minimal exposure of the stools to the peritoneal surfaces. The white line of Toldt was opened to mobilize the ascending colon and the hepatic flexure, and the gastrocolic ligament was entered into the lesser sac, approximately mid transverse colon, such that the entire hepatic flexure could be mobilized. There was a small area where the duodenum was deserosalized, but there was adequate muscular wall present, and I elected not to repair this. The right colic artery was ligated and divided with 2-0 silk ligature, and branches of the middle colic were also ligated and divided with 2-0 silk ligatures. Once the entire portion containing the perforation  and the surrounding inflammatory tissue was mobilized, I elected to resect this and therefore, just the hepatic flexure was resected between GIA staple lines, and the mesocolon was taken down with the Harmonic scalpel and the specimen was passed off the table. A very thorough irrigation with serial dilution of multiple liters of warm normal saline was then undertaken in order to remove all of the remaining stool from the right upper quadrant specifically and the entire peritoneal cavity in general. I then mobilize the mesocolon of the ascending colon in order to have enough length to perform a functional ascending colostomy and I similarly treated the transverse colon for a mucous fistula. The ascending colostomy was performed in the right mid abdomen, and the transverse mucous fistula was performed in the right upper quadrant, just medial and superior to the functional ostomy. Each of these had a cylinder of skin and subcutaneous tissue excised down to the fascia and a longitudinal paramedian slit made in the anterior rectus sheath, rectus muscles, and posterior rectus sheath without any cruciate-type incisions performed. Both the functional ostomy as well as the mucous fistula were delivered through the defect in the abdominal wall, and then the remaining omentum was draped over top of the small intestine in the midline and the linea alba was closed with a running #1 PDS suture. The subcutaneous tissue was irrigated with copious amounts of warm normal saline, and the skin was loosely reapproximated with a skin stapling device, leaving 4 gaps for 1/2-inch saline-soaked Telfa wicks, which were placed, and then a sterile dressing was applied. Both the colostomy and the mucous fistula were matured by excising the complete and a portion  of the staple lines respectively and maturing the full thickness of the colon wall to the full thickness skin with interrupted 3-0 Monocryl sutures. Ostomy appliances were added,  completing the procedure and as previously mentioned, the right-sided Jackson-Pratt drain was left in place at the skin, but re-oriented to [facility] within the patient's peritoneal cavity. The patient tolerated the procedure well. There were no complications.     ____________________________ Jack MangesWilliam F. Jeany Seville, MD wfm:mw D: 05/28/2014 14:43:29 ET T: 05/28/2014 16:18:00 ET JOB#: [id number]  cc: Jack MangesWilliam F. Nguyet Mercer, MD, <Dictator> Jack MangesWILLIAM F Welden Hausmann MD ELECTRONICALLY SIGNED 05/29/2014 9:36

## 2014-06-26 NOTE — Op Note (Signed)
PATIENT NAME:  [name], Kiril F MR#:  [id number] DATE OF BIRTH:  11-27-1940  DATE OF PROCEDURE:  05/21/2014  PREOPERATIVE DIAGNOSES:  1.  Postoperative respiratory insufficiency with respiratory arrest.  2.  Lack of intravenous access.   POSTOPERATIVE DIAGNOSES:  1.  Postoperative respiratory insufficiency with respiratory arrest.  2.  Lack of intravenous access.   OPERATION: Central line insertion.   ANESTHESIA: Local with sedation.   SURGEON: Quentin Orealph L. Ely, MD    OPERATIVE PROCEDURE: With the patient intubated, sedated and in the appropriate position, the patient's right neck was interrogated with the ultrasound. A large internal jugular vein compressible and non-thrombosed was identified. The neck was prepped with Betadine and draped in sterile towels. Xylocaine 1% buffered with sodium bicarbonate was injected over the area identified by the ultrasound. The vein was then cannulated on a single pass using ultrasound and a wire passed into the great vessel system without difficulty. The skin incision was enlarged after removing the needle. A vein dilator was inserted over the wire and the dilator removed. Triple lumen catheter was inserted over the wire without difficulty. The wire was removed. The catheter was sutured in place, aspirated and flushed. A sterile dressing was applied. The patient is undergoing chest x-ray for placement.    ____________________________ Carmie Endalph L. Ely III, MD rle:at D: 05/21/2014 16:09:31 ET T: 05/21/2014 17:30:33 ET JOB#: [id number]  cc: Quentin Orealph L. Ely III, MD, <Dictator> Quentin OreALPH L ELY MD ELECTRONICALLY SIGNED 05/24/2014 20:50

## 2014-06-26 NOTE — H&P (Signed)
PATIENT NAME:  Jack White, Jack White MR#:  [id number] DATE OF BIRTH:  [birth year]  DATE OF ADMISSION:  05/18/2014  PRIMARY CARE PHYSICIAN:  Dr. Sampson GoonFitzgerald.    CHIEF COMPLAINT: Epigastric pain radiating to the left side of the chest today.   HISTORY OF PRESENT ILLNESS: Jack White is a very pleasant 74 year old Caucasian gentleman with past medical history of seasonal allergies, osteoarthritis, hypertension, history of peptic ulcer disease, and type 2 diabetes, who comes to the Emergency Room after he got woken up around 1:00 in the morning complaining of epigastric pain which was sharp, constant, made him feel nauseated and had an episode of diaphoresis. He came to the Emergency Room and initial cardiac workup has been negative, he has remained in sinus rhythm. Currently the patient does not have any pain. He is tolerating clear liquid diet.   In the Emergency Room he received a couple of rounds of IV morphine and Dilaudid. CTA abdomen, pelvis, and chest was negative for PE or dissection. There was possibility of a right lower lobe infiltrate and early cholecystitis with layering of gallstones in the gallbladder and possibly in the neck of the gallbladder. Ultrasound of the abdomen was done, but did not show any gallbladder disease. I spoke with radiology and they do feel on reviewing again the results of his CT scan there are gallstones present, not sure, it did not show up on ultrasound of the abdomen, possibly could be due to amount of overlying fat according to the radiologist.   PAST MEDICAL HISTORY:  1. Type 2 diabetes.  2. Osteoarthritis.  3. History of peptic ulcer disease.  4. History of shingles.   PAST SURGICAL HISTORY:  1. Tonsillectomy.  2. Right knee arthroscopy.  3. Surgery of his left hand.  4. Total right hip replacement.  5. Total right knee replacement.  6. Bilateral cataracts.   ALLERGIES:  TO TOMATO.    MEDICATIONS:  1. Aspirin 81 mg daily.  2. Celebrex 200 mg b.i.d.   3. Diphenhydramine 25 mg once daily as needed for allergies.  4. Doxazosin 2 mg b.i.d.  5. Melatonin 5 mg at bedtime.  6. Metformin 1000 mg b.i.d.  7. Milk of magnesia 30 mL b.i.d. as needed.  8. Mylanta DS 30 mL every 6 hours as needed.  9. Novolin R 4 times a day as needed.  10. Protonix 40 mg b.i.d.  11. Tylenol 500 mg 2 tablets every 6 hours as needed.   FAMILY HISTORY: Notable for diabetes in sister. Mother had heart disease. Father had an abdominal aneurysm.   SOCIAL HISTORY: Lives at home with wife. He is a nonsmoker. Denies any alcohol use.   REVIEW OF SYSTEMS:    CONSTITUTIONAL: No fever, fatigue, weakness.  EYES: No blurry or double vision.  ENT: No tinnitus, ear pain, hearing loss.  RESPIRATORY: No cough, shortness of breath, hemoptysis, COPD.   CARDIOVASCULAR: Positive for chest pain earlier, resolved. Positive for mild tachycardia. No palpitations, syncope. Has a history of hypertension.  GASTROINTESTINAL: No nausea, vomiting, diarrhea. Positive for nausea earlier. No vomiting, no diarrhea or melena.  GENITOURINARY: No dysuria, hematuria, or frequency.  ENDOCRINE: No polyuria, nocturia, or thyroid problems.  HEMATOLOGY: No anemia or easy bruising.  SKIN: No acne or rash.  MUSCULOSKELETAL: Positive for arthritis. No swelling or gout.  NEUROLOGIC: No CVA, TIA, dysarthria, or dementia. PSYCHIATRIC: No anxiety, depression, or bipolar disorder. All other systems reviewed are negative.   PHYSICAL EXAMINATION:  GENERAL: The patient is awake, alert, oriented  x 3, not in acute distress.  VITAL SIGNS: Afebrile, temperature is 99.4, pulse is 123 tachycardic, blood pressure is 92/57, saturations are 91% on room air.  HEENT: Atraumatic, normocephalic. Pupils PERRLA.  EOM intact.  Oral mucosa is moist.  NECK: Supple. No JVD. No carotid bruit.  LUNGS: Clear to auscultation bilaterally. No rales, rhonchi, respiratory distress, or labored breathing.  CARDIOVASCULAR: Both heart sounds  are normal, tachycardia present. No murmur heard. PMI not lateralized. Chest nontender.  EXTREMITIES: Good pedal pulses, good femoral pulses. No lower extremity edema.  ABDOMEN: Soft, benign. There is no tenderness present in the epigastric or right upper quadrant. The patient appears comfortable, however she get several rounds of IV pain medications as well. Positive bowel sounds. No hepatomegaly felt.  NEUROLOGIC:  Grossly intact cranial nerves II through XII. No motor or sensory deficit.  PSYCHIATRIC: The patient is awake, alert, oriented x 3.   LABORATORY DATA:   1.  His first set of cardiac enzymes are negative.  2.  EKG shows normal sinus rhythm with voltage criteria of LVH.   3.  CT of the chest, abdomen, and pelvis shows atherosclerotic changes in the thoracic and lumbar area without aneurysm or dissection. Posterior medial right lower lobe airspace disease. Distention of the gallbladder with some stranding and stones layering in the neck, although ultrasound was unremarkable this could represent developing or early cholecystitis. 4.  Dietitian of the common bile duct without an obstructing mass. Sigmoid diverticulosis without diverticulitis.  5.  L5 spondylosis and spondylolisthesis. Multilevel spondylosis in the thoracolumbar spine with evidence for DISH. Right renal cyst.  6.  Ultrasound of the abdomen shows no gallstone, biliary dilation, limited hepatic evaluation. 7.  Chest x-ray, hypoventilatory chest with bibasilar atelectasis.  8.  LFTs within normal limits. White count is within normal limits.   ASSESSMENT: A 74 year old, Jack White, with history of type 2 diabetes, osteoarthritis, comes in with:   1.  Epigastric abdominal pain radiating up to the chest with tachycardia and low grade fever. The patient has an abnormal CT of the abdomen which possibly shows right lower lobe airspace disease consistent with pneumonia and/or early cholecystitis with gallstones in the  gallbladder along with possibly a gallstone in the neck of the gallbladder. However this is not seen on abdominal ultrasound. We will admit the patient to 2A, rule out cardiac enzymes by ordering 3  sets of cardiac enzymes, continue aspirin, nitroglycerin, p.r.n. morphine. The case was discussed with Dr. Lady Gary who will see the patient in consultation.  2.  We will obtain surgical consultation with the patient's presentation suggestive of possible gallbladder disease. Continue empiric antibiotics which will cover for pneumonia as well as acute cholecystitis. The patient's white count is stable, he is running a low-grade temperature of 99.4. He was somewhat tachycardic earlier.  3.  Type 2 diabetes on sliding scale insulin and metformin. I will hold metformin for now since the patient did get some IV contrast during CT earlier today. We will continue sliding scale insulin.  4.  History of gastroesophageal reflux disease with peptic ulcer disease. Continue Protonix. 5.  History of osteoarthritis. Continue p.r.n. Tylenol for mild pain and moderate pain we will consider Norco.  6.  Deep vein thrombosis prophylaxis. Subcutaneous heparin t.i.d.   Above was discussed with the patient. No family members were present. Further workup according to the patient's clinical course. The patient is a full code.   TIME SPENT: 55 minutes.     ____________________________ Jearl Klinefelter  Ammie Dalton, MD sap:bu D: 05/18/2014 10:26:42 ET T: 05/18/2014 14:38:46 ET JOB#: [id number]  cc: Braelyn Bordonaro A. Allena Katz, MD, <Dictator> Stann Mainland. Sampson Goon, MD Willow Ora MD ELECTRONICALLY SIGNED 05/31/2014 15:57

## 2014-06-26 NOTE — Plan of Care (Signed)
Problem: Discharge Progression Outcomes Goal: Discharge plan in place and appropriate Individualization of care  Recent admission for 1xmonth for several abd surgeries- s/p lap cholecystectomy, s/p colon resection with mucuos fistula. RUQ abscess post perc drainage.  RLQ colostomy. RUQ-JP drain. Pt is a high fall risk. Offer toileting qx1hr with safety checks.  H/o DM, MI, AAA, BPH controlled with meds. Goal: Other Discharge Outcomes/Goals Plan of care progress to goal:    pt up to chair , ambulated in hallway, no  emesis this shift.  ostomy bag changed this shift. IVF infusing as ordered.  PO fluids encouraged. pt c/o itching in bilateral hands, atarax given PRN with relief
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
## 2014-06-26 NOTE — Consult Note (Signed)
PATIENT NAME:  Jack White, Jack White MR#:  [id number] DATE OF BIRTH:  [birth year]  DATE OF CONSULTATION:  05/18/2014  CONSULTING PHYSICIAN:  Cristal Deerhristopher A. Alessa Mazur, MD  REASON FOR CONSULTATION:  Severe epigastric pain times approximately 8 hours, now resolved; mild leukocytosis; gallstones with possible common bile duct dilatation.   HISTORY OF PRESENT ILLNESS:  [name] is a pleasant 74 year old with history of peptic ulcer disease, which he had diagnosed approximately 20 years ago with endoscopy, which he had been treating with PPI and diet, and type 2 diabetes, as well as multiple orthopedic surgeries, who was awoken at approximately 1:00 a.m. with severe epigastric pain, which made him feel nauseated. He had a cardiology workup, which has been negative and MI has been ruled out. He currently is without pain except he has had some mild twinges. No nausea or vomiting since about 5:00 this morning. He did have an ultrasound, which was relatively negative for stones and normal common bile duct. No pericholecystic fluid and gallbladder wall thickening and stranding, but CT on my view shows gallstones and mildly dilated common bile duct. No fevers, chills, night sweats, shortness of breath, cough, chest pain, abdominal pain currently, nausea and vomiting currently, diarrhea, constipation, dysuria, or hematuria.   PAST MEDICAL HISTORY: 1.  Type 2 diabetes.  2.  History of peptic ulcer disease.  3.  Osteoarthritis.  4.  History of shingles.  5.  History of multiple orthopedic surgeries.  6.  History of tonsillectomy.  7.  History of bilateral cataracts.   HOME MEDICATIONS:   1.  Aspirin.  2.  Celebrex.  3.  Diphenhydramine.  4.  Doxazosin. 5.  Melatonin.  6.  Metformin.  7.  Milk of Magnesia. 8.  Mylanta.  9.  Novolin. 10.  Protonix.  11.  Tylenol.   FAMILY HISTORY:  Diabetes in family. Multiple people with abdominal aortic aneurysms. History of CHF in mother.   SOCIAL HISTORY:  Nonsmoker.  No alcohol use.   REVIEW OF SYSTEMS:  Full review of systems obtained. Pertinent positives and negatives as above.   PHYSICAL EXAMINATION: VITAL SIGNS:  Temperature 99.2, pulse 100, blood pressure 107/67, respirations 20, oxygen saturation 96% on 3 liters.  GENERAL:  No acute distress. Alert and oriented x 3.  HEAD:  Normocephalic, atraumatic.  EYES:  No scleral icterus. No conjunctivitis.  FACE:  No obvious face trauma. Normal external nose. Normal external ears.  CHEST:  Lungs are clear to auscultation. Moving air well.  HEART:  Regular rate and rhythm. No murmurs, rubs, or gallops.  ABDOMEN:  Soft, nontender, nondistended.  EXTREMITIES:  He moves all extremities well. Strength is 5/5.  NEUROLOGIC:  Cranial nerves II through XII are grossly intact.   LABORATORY DATA:  Significant for white cell count of 10.6 with 86% neutrophils. Bilirubin, LFTs, and lipase were all normal. BMP was normal as well.   Ultrasound showed normal gallbladder without stones. No pericholecystic fluid. No gallbladder wall thickening. Common bile duct is 6 mm.   CT scan shows layering gallstones in the gallbladder and mildly dilated common bile duct and pancreatic head. They read pericholecystic stranding, which I do not appreciate.   ASSESSMENT AND PLAN:  [name] is a pleasant 74 year old who presents with acute onset epigastric pain and has never had this pain before but has a history of peptic ulcer disease, but does also have gallstones, mild common bile duct dilatation, and white cell count mildly elevated at 10.6 with 86% neutrophils, favor cholelithiasis versus cholecystitis  plus or minus choledocholithiasis. Another possibility is peptic ulcer disease. We will discuss with daytime surgeon, Dr. Michela Pitcher. We will make the patient n.p.o. after midnight and obtain labs. We will discuss whether the patient would benefit from cholecystectomy, however, may need HIDA scan first per his preference due to the history of  peptic ulcer disease. Will continue to follow.    ____________________________ Si Raider Sakoya Win, MD cal:nb D: 05/18/2014 20:10:42 ET T: 05/19/2014 02:35:40 ET JOB#: [id number]  cc: Cristal Deer A. Yoon Barca, MD, <Dictator> Jarvis Newcomer MD ELECTRONICALLY SIGNED 05/19/2014 19:47

## 2014-06-26 NOTE — Discharge Summary (Signed)
PATIENT NAME:  Jack White, Jack White MR#:  [id number] DATE OF BIRTH:  1940-09-07  DATE OF ADMISSION:  05/18/2014 DATE OF DISCHARGE:  06/17/2014  FINAL DIAGNOSES:  1.  Acute gangrenous cholecystitis.  2.  Hepatic flexure colonic injury requiring exploratory laparotomy, colon resection, creation of end colostomy, and mucous fistula.  3.  Laparoscopic cholecystectomy.  4.  Percutaneous drainage catheter placed in the right upper quadrant abscess.  5.  Prolonged intensive care unit stay.  6.  PICC line placement.  7.  Total parenteral nutrition. 8.  Internal medicine consultation.  9.  Cardiology consultation.  10.  Gastroenterology medicine consultation.   HOSPITAL COURSE SUMMARY:  The patient was admitted with severe epigastric pain.  Was found during his work-up to have acute cholecystitis. Laparoscopic cholecystectomy demonstrating acute gangrenous cholecystitis with perforation was performed by Dr. Michela PitcherEly on the March 26. Postoperatively, the patient had a Jackson-Pratt drain which started draining stool. He remained critically ill in the intensive care unit. Despite attempted conservative measures, his mental status continued to deteriorate.  He did become signs of continuing sepsis and as such was taken to the operating room by Dr. Anda KraftMarterre on April 2 at which point partial colectomy of the hepatic flexure, drainage and washout with ascending colostomy, and transverse mucous fistula was performed. Postoperatively, he remained again in the intensive care unit.  Had a prolonged course with mental status changes. Was started on TPN via a PICC line in the right upper quadrant. He was he able to be eventually transferred to the floor. Abscess was noted on repeat imaging in the postoperative setting and a percutaneous catheter was placed 8 days ago in the right upper quadrant fluid collection. A repeat CT scan prior to his discharge demonstrated the catheter to be in adequate location. Staples and remaining  sutures were removed the day of his discharge. His mental status completely improved. He was tolerating a regular diet and was discontinued on TPN. Antibiotics were also discontinued. He was deemed a suitable rehabilitation candidate due to his previous high level of functional care. TPN was discontinued.  Insulin regimen was adjusted. He was placed back on his metformin. He is tolerating a regular diet. He will follow up in our office in 1 weeks' time and be discharged to the rehabilitation facility with a Jackson-Pratt drain in place.   MEDICATIONS: Can be found on the reconciliation form and prescriptions were provided.    ____________________________ Redge GainerMark A. Egbert GaribaldiBird, MD mab:sp D: 06/17/2014 11:09:39 ET T: 06/17/2014 11:31:40 ET JOB#: [id number]  cc: Loraine LericheMark A. Egbert GaribaldiBird, MD, <Dictator> Raynald KempMARK A Cabela Pacifico MD ELECTRONICALLY SIGNED 06/22/2014 12:38

## 2014-06-26 NOTE — Consult Note (Signed)
Chief Complaint:  Subjective/Chief Complaint The patient remains ill status post chole. Now on vent in ICU.   VITAL SIGNS/ANCILLARY NOTES: **Vital Signs.:   27-Mar-16 03:00  Respirations Respirations 26    09:00  Pulse Pulse 115  Respirations Respirations 24  Systolic BP Systolic BP 90  Diastolic BP (mmHg) Diastolic BP (mmHg) 48  Mean BP 62  Pulse Ox % Pulse Ox % 97  Oxygen Delivery Ventilator Assisted   Brief Assessment:  GEN critically ill appearing   Gastrointestinal details normal Soft  Nondistended   Assessment/Plan:  Assessment/Plan:  Assessment Acute chole   Plan The patient is in gaurded condition post surgery. Not known if CBD stone present.  No urgancy of an ERCP at this time with multiple drains in place.   Electronic Signatures: Midge MiniumWohl, Valeen Borys (MD)  (Signed 27-Mar-16 11:54)  Authored: Chief Complaint, VITAL SIGNS/ANCILLARY NOTES, Brief Assessment, Assessment/Plan   Last Updated: 27-Mar-16 11:54 by Midge MiniumWohl, Kawana Hegel (MD)

## 2014-06-27 DIAGNOSIS — K810 Acute cholecystitis: Secondary | ICD-10-CM | POA: Diagnosis present

## 2014-06-27 LAB — GLUCOSE, CAPILLARY
GLUCOSE-CAPILLARY: 138 mg/dL — AB (ref 70–99)
GLUCOSE-CAPILLARY: 132 mg/dL — AB (ref 70–99)
GLUCOSE-CAPILLARY: 145 mg/dL — AB (ref 70–99)
Glucose-Capillary: 120 mg/dL — ABNORMAL HIGH (ref 70–99)

## 2014-06-27 MED ORDER — LOPERAMIDE HCL 2 MG PO CAPS: 2.0000 mg | ORAL_CAPSULE | Freq: Every day | ORAL | Status: DC | PRN

## 2014-06-27 MED ORDER — ONDANSETRON HCL 4 MG PO TABS
4.0000 mg | ORAL_TABLET | Freq: Four times a day (QID) | ORAL | Status: DC | PRN
  Administered 2014-06-27 – 2014-06-29 (×6): 4 mg via ORAL
  Filled 2014-06-27 (×6): qty 1

## 2014-06-27 NOTE — Progress Notes (Signed)
  Not drinking great yet Ostomy output still a little high In great spirits  Vital signs in last 24 hours: Temp:  [97.8 F (36.6 C)-99.6 F (37.6 C)] 98.5 F (36.9 C) (05/02 1326) Pulse Rate:  [87-100] 100 (05/02 1326) Resp:  [20] 20 (05/02 0509) BP: (107-128)/(51-64) 123/64 mmHg (05/02 1326) SpO2:  [96 %-98 %] 97 % (05/02 1326) Last BM Date: 06/27/14  Intake/Output from previous day: 05/01 0701 - 05/02 0700 In: 1907 [P.O.:720; I.V.:1187] Out: 800 [Urine:600; Stool:200] Intake/Output this shift:    GI: soft, non-tender; bowel sounds normal; no masses,  no organomegaly  Lab Results:  CBC  Recent Labs  06/25/14 0453  WBC 5.8  HGB 8.4*  HCT 25.5*  PLT 169   CMP     Component Value Date/Time   NA 138 06/25/2014 0453   K 4.5 06/25/2014 0453   CL 110 06/25/2014 0453   CO2 24 06/25/2014 0453   GLUCOSE 99 06/25/2014 0453   BUN 17 06/25/2014 0453   CREATININE 1.18 06/25/2014 0453   CALCIUM 7.8* 06/25/2014 0453   PROT 8.4* 06/22/2014 1734   ALBUMIN 2.8* 06/22/2014 1734   AST 28 06/22/2014 1734   ALT 24 06/22/2014 1734   ALKPHOS 96 06/22/2014 1734   GFRNONAA >60 06/25/2014 0453   GFRAA >60 06/25/2014 0453   PT/INR No results for input(s): LABPROT, INR in the last 72 hours. ABG No results for input(s): PHART, HCO3 in the last 72 hours.  Invalid input(s): PCO2, PO2  Studies/Results: No results found.  Assessment/Plan: s/p * No surgery found * Change Zofran to 4 - 8 tabs Imodium

## 2014-06-27 NOTE — Plan of Care (Addendum)
Problem: Discharge Progression Outcomes Goal: Other Discharge Outcomes/Goals  Individualization of care  Recent admission for 1xmonth for several abd surgeries- s/p lap cholecystectomy, s/p colon resection with mucuos fistula. RUQ abscess post perc drainage.  RLQ colostomy. RUQ-JP drain. Pt is a high fall risk. Offer toileting qx1hr with safety checks.  H/o DM, MI, AAA, BPH controlled with meds.    Plan of care progress to goal:  Pain: no complaints of pain  Hemodynamically stable: labs stable, continue IVF per md order  Diet: pt not tolerating diet, IVF continue, pt encouraged to drink ensue and

## 2014-06-27 NOTE — Progress Notes (Signed)
Physical Therapy Treatment Patient Details Name: Jack White MRN: [id number] DOB: 10-01-1940 Today's Date: 06/27/2014    History of Present Illness      PT Comments    Pt agreeable/eager for PT today; daughter present. Pt with significant increase in ambulation distance; no LOB. Pt/daughter have many questions regarding strengthening exercises to improve ambulation and stair climbing with education provided to both. Pt declined steps today due to fatigue post ambulation/exercises. Pt needs to perform 5 steps to enter home before safe recommendation to discharge home. Pt's spouse limited due to back pain and can only assist pt minimally. Discussed bathing/dressing concerns with pt and spouse as well and discussed with OT to request order and address before discharge. Pt may need additional equipment based on OT assessment.  Follow Up Recommendations  Home health PT (Home health versus SNF; pt needs to perform 5 STE to go home safey)     Equipment Recommendations  Rolling walker with 5" wheels    Recommendations for Other Services OT consult (discussed bathing equipment and equipment to assist with don/doff shoes as needed; Discussed with OT)     Precautions / Restrictions      Mobility  Bed Mobility                  Transfers Overall transfer level: Modified independent Equipment used: Rolling walker (2 wheeled)             General transfer comment: STS with Min guard  Ambulation/Gait Ambulation/Gait assistance: Min guard Ambulation Distance (Feet): 210 Feet Assistive device: Rolling walker (2 wheeled) Gait Pattern/deviations: WFL(Within Functional Limits);Trunk flexed   Gait velocity interpretation: at or above normal speed for age/gender     Stairs            Wheelchair Mobility    Modified Rankin (Stroke Patients Only)       Balance                                    Cognition Arousal/Alertness: Awake/alert Behavior  During Therapy: WFL for tasks assessed/performed Overall Cognitive Status: Within Functional Limits for tasks assessed                      Exercises General Exercises - Lower Extremity Ankle Circles/Pumps: AROM;Both;20 reps Quad Sets: Strengthening;Both;20 reps Gluteal Sets: Strengthening;Both;20 reps Long Arc Quad: Strengthening;Both;20 reps Heel Slides: AROM;Both;20 reps Hip Flexion/Marching: AROM;Both;20 reps Toe Raises: AROM;Both;20 reps    General Comments        Pertinent Vitals/Pain Pain Assessment: No/denies pain    Home Living                      Prior Function            PT Goals (current goals can now be found in the care plan section) Progress towards PT goals: Progressing toward goals    Frequency  Min 2X/week    PT Plan Current plan remains appropriate    Co-evaluation             End of Session Equipment Utilized During Treatment: Gait belt Activity Tolerance:  (Tolerated increased ambulation distance; good participation in exercises. Steps held today due to fatigue) Patient left: in chair;with call bell/phone within reach;with chair alarm set;with family/visitor present     Time: [id number] PT Time Calculation (min) (ACUTE ONLY): 27 min  Charges:  $Gait  Training: 8-22 mins $Therapeutic Exercise: 8-22 mins                    G Codes:      Kristeen Miss 06/27/2014, 3:30 PM

## 2014-06-27 NOTE — Plan of Care (Signed)
Problem: Discharge Progression Outcomes Goal: Activity appropriate for discharge plan Outcome: Progressing No c/o pain. Increased appetite. Tolerating diet. Zofran scheduled before meals. Ambulating with physical therapy. Tolerated well.

## 2014-06-28 LAB — GLUCOSE, CAPILLARY
GLUCOSE-CAPILLARY: 123 mg/dL — AB (ref 70–99)
GLUCOSE-CAPILLARY: 129 mg/dL — AB (ref 70–99)
Glucose-Capillary: 176 mg/dL — ABNORMAL HIGH (ref 70–99)
Glucose-Capillary: 110 mg/dL — ABNORMAL HIGH (ref 70–99)

## 2014-06-28 NOTE — Plan of Care (Signed)
Problem: Discharge Progression Outcomes Goal: Other Discharge Outcomes/Goals Outcome: Progressing Patient c/o no pain at this time. Patient given Xanax and ambien to help sleep, Benadryl for itchiness. Patient appeared frustrated, c/o not being able to rest. No N/V at this time.Colostomy dry and intact.

## 2014-06-28 NOTE — Progress Notes (Signed)
Nutrition Follow-up   INTERVENTION:  Ensure Enlive (each supplement provides 350kcal and 20 grams of protein), Magic cup Continue Ensure Enlive TID Add Magic cup with meals  NUTRITION DIAGNOSIS:  Inadequate oral intake related to altered GI function, nausea as evidenced by meal completion < 50%.  Ongoing.   GOAL:  Patient will meet greater than or equal to 90% of their needs  Not met.   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  Pt has been followed by RD.  Per pt he has been consuming about 50% of his meal trays and drinks ensure mixed with milk as he feels this is too sweet. He may get in about 2 ensure enlives per day per wife description. Agreeable to trying magic cup but did not like mighty shakes.  Pt taking zofran before meals and feels that this helps with nausea.  Per pt and his wife ostomy output seems about the same. Per documentation pt has had 100 ml out this am.  Discussed importance of adequate nutrition and fluid.   No new labs. Medications: immodium  Height:  Ht Readings from Last 1 Encounters:  06/24/14 6' (1.829 m)    Weight:  Wt Readings from Last 1 Encounters:  06/24/14 210 lb (95.255 kg)      Wt Readings from Last 10 Encounters:  06/24/14 210 lb (95.255 kg)    BMI:  Body mass index is 28.47 kg/(m^2).  Estimated Nutritional Needs:  Kcal:  2200-2400  Protein:  115-130 grams  Fluid:  > 2.5 L/day  Skin:  Reviewed, no issues (colostomy present)  Diet Order:  Diet regular Room service appropriate?: Yes; Fluid consistency:: Thin  EDUCATION NEEDS:  No education needs identified at this time   Intake/Output  Summary (Last 24 hours) at 06/28/14 1416 Last data filed at 06/28/14 1318  Gross per 24 hour  Intake    999 ml  Output   1200 ml  Net   -201 ml    Last BM:  5/3 per colostomy  Maylon Peppers RD, [place], [place] Pager [phone] After Hours Pager

## 2014-06-28 NOTE — Progress Notes (Signed)
L pedal weak; R pedal and bilateral radial ok

## 2014-06-28 NOTE — Progress Notes (Signed)
Physical Therapy Treatment Patient Details Name: Jack White MRN: [id number] DOB: 1940-04-18 Today's Date: 06/28/2014    History of Present Illness      PT Comments    Pt with decreased tolerance for gait distance today and unable to complete 5 steps safely due to fatigue and dizziness requiring seated rest and wheelchair to return to room. Pt also had difficulty on steps with sequencing requiring increased cueing and support due to weakness. Recommend SNF upon discharge.   Follow Up Recommendations  SNF (Pt unable to complete steps safely; decreased ambulation)     Equipment Recommendations  Rolling walker with 5" wheels    Recommendations for Other Services       Precautions / Restrictions Precautions Precautions: Other (comment) (colostomy bag)    Mobility  Bed Mobility Overal bed mobility: Modified Independent                Transfers   Equipment used: Rolling walker (2 wheeled)             General transfer comment: STS with Min guard (Initial Min Gaurd from bed; post stairs Mod A x 2)  Ambulation/Gait Ambulation/Gait assistance: Min guard Ambulation Distance (Feet): 90 Feet Assistive device: Rolling walker (2 wheeled) Gait Pattern/deviations: WFL(Within Functional Limits)   Gait velocity interpretation: at or above normal speed for age/gender General Gait Details:  (Ambulates with forward flexed posture; steady speed)   Stairs Stairs: Yes Stairs assistance: Mod assist Stair Management: Two rails;Step to pattern Number of Stairs:  (5) General stair comments:  (Unable to complete safely due to fatigue/dizziness          )  Wheelchair Mobility    Modified Rankin (Stroke Patients Only)       Balance                                    Cognition Arousal/Alertness: Awake/alert Behavior During Therapy: WFL for tasks assessed/performed Overall Cognitive Status: Within Functional Limits for tasks assessed                       Exercises      General Comments        Pertinent Vitals/Pain Pain Assessment: No/denies pain    Home Living                      Prior Function            PT Goals (current goals can now be found in the care plan section) Progress towards PT goals: Progressing toward goals    Frequency  Min 2X/week    PT Plan Discharge plan needs to be updated    Co-evaluation             End of Session Equipment Utilized During Treatment: Gait belt (wheelchair) Activity Tolerance: Patient limited by fatigue (Dizziness) Patient left: in bed;with call bell/phone within reach;with bed alarm set     Time: 1415-1442 PT Time Calculation (min) (ACUTE ONLY): 27 min  Charges:                       G Codes:      Kristeen MissHeidi Elizabeth Bishop 06/28/2014, 2:59 PM

## 2014-06-28 NOTE — Progress Notes (Signed)
Subjective: Doing better Walking 200 ft and climbed 10 stairs Drinking better Ostomy output still pretty liquid  Vital signs in last 24 hours: Temp:  [97.8 F (36.6 C)-98.6 F (37 C)] 98.1 F (36.7 C) (05/03 1505) Pulse Rate:  [86-102] 96 (05/03 1505) Resp:  [18-20] 18 (05/03 1505) BP: (109-134)/(44-73) 109/44 mmHg (05/03 1505) SpO2:  [96 %-100 %] 97 % (05/03 1505) Last BM Date: 06/28/14  Intake/Output from previous day: 05/02 0701 - 05/03 0700 In: 879 [P.O.:480; I.V.:399] Out: 800 [Urine:800]  PERTINENT EXAM: Normal abdomen  Lab Results:  CBC No results for input(s): WBC, HGB, HCT, PLT in the last 72 hours. CMP     Component Value Date/Time   NA 138 06/25/2014 0453   K 4.5 06/25/2014 0453   CL 110 06/25/2014 0453   CO2 24 06/25/2014 0453   GLUCOSE 99 06/25/2014 0453   BUN 17 06/25/2014 0453   CREATININE 1.18 06/25/2014 0453   CALCIUM 7.8* 06/25/2014 0453   PROT 8.4* 06/22/2014 1734   ALBUMIN 2.8* 06/22/2014 1734   AST 28 06/22/2014 1734   ALT 24 06/22/2014 1734   ALKPHOS 96 06/22/2014 1734   GFRNONAA >60 06/25/2014 0453   GFRAA >60 06/25/2014 0453   PT/INR No results for input(s): LABPROT, INR in the last 72 hours.  Studies/Results: No results found.  Assessment/Plan: P - [facility]

## 2014-06-29 LAB — GLUCOSE, CAPILLARY
GLUCOSE-CAPILLARY: 120 mg/dL — AB (ref 70–99)
Glucose-Capillary: 120 mg/dL — ABNORMAL HIGH (ref 70–99)

## 2014-06-29 LAB — PLATELET COUNT: Platelets: 151 10*3/uL (ref 150–440)

## 2014-06-29 NOTE — Discharge Summary (Signed)
Patient ID: Jack SayresWilliam F Sahni MRN: [id number] DOB/AGE: 76-Sep-1942 76 y.o.  Admit date: 06/22/2014 Discharge date: 06/29/2014  Discharge Diagnoses:  Dehydration   Procedures Performed: none  Discharged Condition: stable  Hospital Course: The patient underwent IVF hydration for the above mentioned diagnosis. At the time of D/C the patient's condition was as listed above. He was participating in PT, tolerating a diet, and his ostomy output was considerably thicker on Imodium than preadmission.  Discharge Orders:   Discharge Medications:  Current facility-administered medications:  .  0.9 %  sodium chloride infusion, , Intravenous, Continuous, Natale LayMark Bird, MD, Last Rate: 30 mL/hr at 06/28/14 0819 .  aspirin EC tablet 81 mg, 81 mg, Oral, Daily, Doctor Chlconversion, MD, 81 mg at 06/29/14 1156 .  doxazosin (CARDURA) tablet 2 mg, 2 mg, Oral, Q12H, Doctor Chlconversion, MD, 2 mg at 06/29/14 1156 .  feeding supplement (ENSURE ENLIVE) (ENSURE ENLIVE) liquid 237 mL, 237 mL, Oral, TID WC, Doctor Chlconversion, MD, 237 mL at 06/29/14 0900 .  hydrocortisone cream 1 %, , Topical, TID, Doctor Chlconversion, MD .  hydrOXYzine (ATARAX/VISTARIL) tablet 10 mg, 10 mg, Oral, Q6H PRN, Doctor Chlconversion, MD .  insulin aspart (novoLOG) injection 0-10 Units, 0-10 Units, Subcutaneous, TID AC & HS, Doctor Chlconversion, MD, 2 Units at 06/28/14 1151 .  loperamide (IMODIUM) capsule 2 mg, 2 mg, Oral, 5 X Daily PRN, Duwaine MaxinWilliam Rogene Meth, MD .  Melatonin TABS 9 mg, 9 mg, Oral, QHS, Doctor Chlconversion, MD, 9 mg at 06/28/14 2202 .  ondansetron (ZOFRAN) tablet 4 mg, 4 mg, Oral, QID PRN, Duwaine MaxinWilliam Camellia Popescu, MD, 4 mg at 06/29/14 1157 .  sodium chloride 0.9 % injection 3-6 mL, 3-6 mL, Intravenous, Q10 min PRN, Doctor Chlconversion, MD .  traZODone (DESYREL) tablet 50 mg, 50 mg, Oral, QHS, Doctor Chlconversion, MD, 50 mg at 06/28/14 2203 .  zolpidem (AMBIEN) tablet 5 mg, 5 mg, Oral, QHS PRN, Doctor Chlconversion, MD, 5 mg at  06/28/14 2203  [place]:   Signed: Claude MangesWilliam F Modupe Shampine 06/29/2014, 1:33 PM

## 2014-06-29 NOTE — Progress Notes (Signed)
Physical Therapy Treatment Patient Details Name: Jack White MRN: [id number] DOB: [birth year] Today's Date: 06/29/2014    History of Present Illness      PT Comments    Pt making good progress toward stated goals. Demonstrating improved strength with exercises. HR elevating post ambulation of 125 ft requiring several minute rest to return to baseline (from 111 initial to 136/137 bpm post ambulation). 118 bpm post 3 minute rest. Educated pt/spouse on energy conservation/activity modification.   Follow Up Recommendations  SNF     Equipment Recommendations  Rolling walker with 5" wheels    Recommendations for Other Services       Precautions / Restrictions Restrictions Weight Bearing Restrictions: No    Mobility  Bed Mobility Overal bed mobility: Modified Independent                Transfers Overall transfer level: Modified independent Equipment used: Rolling walker (2 wheeled)             General transfer comment: STS with Min guard  Ambulation/Gait Ambulation/Gait assistance: Min guard Ambulation Distance (Feet): 125 Feet Assistive device: Rolling walker (2 wheeled) Gait Pattern/deviations: Step-through pattern   Gait velocity interpretation: at or above normal speed for age/gender General Gait Details:  (Ambulates with forward flexed posture; increased HR post)   Stairs            Wheelchair Mobility    Modified Rankin (Stroke Patients Only)       Balance                                    Cognition Arousal/Alertness: Awake/alert Behavior During Therapy: WFL for tasks assessed/performed Overall Cognitive Status: Within Functional Limits for tasks assessed                      Exercises General Exercises - Lower Extremity Long Arc Quad: Strengthening;Both;20 reps Hip Flexion/Marching: Strengthening;Both;20 reps Toe Raises: AROM;Both;20 reps Other Exercises Other Exercises:  (Heel raises BLE 20x, resisted  ham curl B 20x) Other Exercises: Seated hip abduction/adduction B 20x    General Comments        Pertinent Vitals/Pain Pain Assessment: No/denies pain    Home Living                      Prior Function            PT Goals (current goals can now be found in the care plan section) Progress towards PT goals: Progressing toward goals    Frequency  Min 2X/week    PT Plan Current plan remains appropriate    Co-evaluation             End of Session Equipment Utilized During Treatment: Gait belt Activity Tolerance:  (Good tolerance; requires rest periods to manage HR) Patient left: in chair;with call bell/phone within reach;with chair alarm set;with family/visitor present     Time: [id number] PT Time Calculation (min) (ACUTE ONLY): 35 min  Charges:  $Gait Training: 8-22 mins $Therapeutic Exercise: 8-22 mins                    G Codes:      Kristeen MissHeidi Elizabeth Bishop 06/29/2014, 11:56 AM

## 2014-06-29 NOTE — Plan of Care (Signed)
Problem: Discharge Progression Outcomes Goal: Other Discharge Outcomes/Goals Outcome: Progressing Plan of Care Progress to goal: Pt [facility] Rm# [id number]; IV removed.  [facility] [place] [facility]. Pt committed to getting stronger; drinking more fluids/water.  EMS called for transport.

## 2014-06-29 NOTE — Clinical Social Work Note (Signed)
Patient for d/c today to SNF bed at [facility]. Family and patient agreeable to this plan- plan transfer via EMS. Natahsa Marian, MSW, LCSWA   

## 2014-06-29 NOTE — Plan of Care (Signed)
Problem: Discharge Progression Outcomes Goal: Discharge plan in place and appropriate Individualization of care  Outcome: Progressing Individualization of care  Recent admission for 1xmonth for several abd surgeries- s/p lap cholecystectomy, s/p colon resection with mucuos fistula. RUQ abscess post perc drainage.  RLQ colostomy.  Pt is a high fall risk. Offer toileting qx1hr with safety checks.  H/o DM, MI, AAA, BPH controlled with meds.    Goal: Other Discharge Outcomes/Goals Outcome: Progressing Plan of Care Progress to Goals: Patient is calm, cooperative. Ambien given for insomnia, Xanax for anxiety, Benadryl IV for itchiness with improvement. Colostomy remains intact. No c/o of pain or discomfort. No N/V. PO intake encouraged, bed mobility encouraged.

## 2014-08-04 ENCOUNTER — Encounter: Payer: Self-pay | Admitting: Surgery

## 2014-08-04 ENCOUNTER — Telehealth: Payer: Self-pay | Admitting: Surgery

## 2014-08-04 ENCOUNTER — Ambulatory Visit (INDEPENDENT_AMBULATORY_CARE_PROVIDER_SITE_OTHER): Payer: Medicare Other | Admitting: Surgery

## 2014-08-04 VITALS — BP 112/62 | HR 76 | Temp 98.5°F | Ht 73.0 in | Wt 262.0 lb

## 2014-08-04 DIAGNOSIS — K8000 Calculus of gallbladder with acute cholecystitis without obstruction: Secondary | ICD-10-CM

## 2014-08-04 MED ORDER — BUTALBITAL-APAP-CAFFEINE 50-325-40 MG PO TABS: 1.0000 | ORAL_TABLET | Freq: Four times a day (QID) | ORAL | Status: AC | PRN

## 2014-08-04 NOTE — Telephone Encounter (Signed)
Returned call and talked to [employer] with [employer]. Mark needed to know which surgeon to address patient's orders too. I informed Loraine Leriche to send orders for the patient addressed to Dr Michela Pitcher. Loraine Leriche confirmed understanding of information.

## 2014-08-04 NOTE — Progress Notes (Signed)
Outpatient Surgical Follow Up  08/04/2014  Jack White is an 74 y.o. male.   Chief Complaint  Patient presents with  . Routine Post Op    ostomy    HPI: He returns for follow-up after his complicated surgery for an acute cholecystitis with gangrenous changes. He developed a colon injury postop and underwent a colon resection with double barrel colostomy. He had respiratory insufficiency and was on a ventilator for several days following the second procedure. Very slowly recovered is now home from rehabilitation and back taking physical therapy. He is eating relatively well. His bowel function appears to be improving. His energy level is still low but much improved.  No past medical history on file.  No past surgical history on file.  No family history on file.  Social History:  reports that he has never smoked. He does not have any smokeless tobacco history on file. He reports that he does not drink alcohol or use illicit drugs.  Allergies:  Allergies  Allergen Reactions  . Dilaudid [Hydromorphone Hcl] Other (See Comments)    Respiratory arrest  . Tomato Other (See Comments)    GI distress, respiratory distress    Medications reviewed.    ROS no change in his review of systems.    BP 112/62 mmHg  Pulse 76  Temp(Src) 98.5 F (36.9 C) (Oral)  Ht 6\' 1"  (1.854 m)  Wt 262 lb (118.842 kg)  BMI 34.57 kg/m2  Physical Exam His physical examination demonstrates patent mucus fistula was some mild mucous changes his abdominal wounds look good. There is no sign of any infection.      No results found for this or any previous visit (from the past 48 hour(s)). No results found.  Assessment/Plan:  1. Calculus of gallbladder with acute cholecystitis without obstruction We discussed situation at length. He is having some mild headaches so we will prescribe some Fiorinal. I anticipate that he will be 4-6 months having his ostomy prepare for closure. We discussed those options  with him. We'll have him back in 1 month's time. His wife is present for the interview. - butalbital-acetaminophen-caffeine (FIORICET) 50-325-40 MG per tablet; Take 1-2 tablets by mouth every 6 (six) hours as needed for headache.  Dispense: 20 tablet; Refill: 0     Tiney Rouge III  08/04/2014,negative

## 2014-08-04 NOTE — Telephone Encounter (Signed)
Please call patients home health nurse. He is needing verbal orders

## 2014-09-07 ENCOUNTER — Encounter: Payer: Self-pay | Admitting: Surgery

## 2014-09-07 ENCOUNTER — Ambulatory Visit (INDEPENDENT_AMBULATORY_CARE_PROVIDER_SITE_OTHER): Payer: Medicare Other | Admitting: Surgery

## 2014-09-07 VITALS — BP 120/69 | HR 70 | Temp 98.6°F | Ht 72.0 in | Wt 230.0 lb

## 2014-09-07 DIAGNOSIS — K8000 Calculus of gallbladder with acute cholecystitis without obstruction: Secondary | ICD-10-CM

## 2014-09-07 NOTE — Progress Notes (Signed)
Outpatient Surgical Follow Up  09/07/2014  Jack White is an 74 y.o. male.   Chief Complaint  Patient presents with  . Follow-up    HPI: He returns for follow-up after his gangrenous acute cholecystitis and colon injury. He is now out approximately 4 months from his original surgery. He is doing very well post surgery. He wants to return to work. His energy level is near normal.  History reviewed. No pertinent past medical history.  History reviewed. No pertinent past surgical history.  History reviewed. No pertinent family history.  Social History:  reports that he has never smoked. He does not have any smokeless tobacco history on file. He reports that he does not drink alcohol or use illicit drugs.  Allergies:  Allergies  Allergen Reactions  . Dilaudid [Hydromorphone Hcl] Other (See Comments)    Respiratory arrest  . Tomato Other (See Comments)    GI distress, respiratory distress    Medications reviewed.    Review of Systems  Constitutional: Negative.   HENT: Negative.   Eyes: Negative.   Respiratory: Negative.   Cardiovascular: Negative.   Gastrointestinal: Negative for nausea, vomiting and abdominal pain.  Genitourinary: Negative.   Musculoskeletal: Negative.   Skin: Negative.   Neurological: Negative.   Endo/Heme/Allergies: Negative.   Psychiatric/Behavioral: Negative.       BP 120/69 mmHg  Pulse 70  Temp(Src) 98.6 F (37 C) (Oral)  Ht 6' (1.829 m)  Wt 104.327 kg (230 lb)  BMI 31.19 kg/m2  Physical Exam's abdomen is soft with a normal looking mucous fistula. The ostomy is mildly edematous but functioning and appears pink and viable. He has no wound problems.     No results found for this or any previous visit (from the past 48 hour(s)). No results found.  Assessment/Plan:  1. Calculus of gallbladder with acute cholecystitis without obstruction He's doing very well post surgery. He's now about 4 months and we will anticipate attempt at  colostomy closure early in September. We'll see back in one month for further follow-up in preparation for the colostomy closure. He is in agreement.     Tiney RougeRalph Ely III  09/07/2014,negative

## 2014-09-07 NOTE — Patient Instructions (Signed)
You are able to go back to work starting tomorrow.

## 2014-09-08 ENCOUNTER — Encounter: Payer: Medicare Other | Admitting: Surgery

## 2014-09-16 LAB — LIPID PANEL
Cholesterol: 103 mg/dL
HDL Cholesterol: 24 mg/dL — ABNORMAL LOW
Ldl Cholesterol, Calc: 66 mg/dL
Triglycerides: 64 mg/dL
VLDL Cholesterol, Calc: 13 mg/dL

## 2014-09-16 LAB — TROPONIN I: Troponin-I: <0.03 ng/mL

## 2014-09-16 LAB — CBC
HCT: 41.9 % (ref 40.0–52.0)
HGB: 13.8 g/dL (ref 13.0–18.0)
MCH: 31.1 pg (ref 26.0–34.0)
MCHC: 32.9 g/dL (ref 32.0–36.0)
MCV: 94 fL (ref 80–100)
Platelet: 227 10*3/uL (ref 150–440)
RBC: 4.44 10*6/uL (ref 4.40–5.90)
RDW: 12.4 % (ref 11.5–14.5)
WBC: 10.6 10*3/uL (ref 3.8–10.6)

## 2014-09-16 LAB — COMPREHENSIVE METABOLIC PANEL
ALT: 13 U/L — AB
AST: 18 U/L
Albumin: 3.5 g/dL
Alkaline Phosphatase: 70 U/L
Anion Gap: 9 (ref 7–16)
BUN: 21 mg/dL — AB
Bilirubin,Total: 1.1 mg/dL
CALCIUM: 8.7 mg/dL — AB
Chloride: 102 mmol/L
Co2: 25 mmol/L
Creatinine: 1.52 mg/dL — ABNORMAL HIGH
GFR CALC AF AMER: 51 — AB
GFR CALC NON AF AMER: 44 — AB
GLUCOSE: 227 mg/dL — AB
POTASSIUM: 4 mmol/L
Sodium: 136 mmol/L
TOTAL PROTEIN: 7.2 g/dL

## 2014-09-16 LAB — URINALYSIS, COMPLETE
Bacteria: NONE SEEN
Bilirubin,UR: NEGATIVE
Glucose,UR: NEGATIVE mg/dL (ref 0–75)
KETONE: NEGATIVE
Leukocyte Esterase: NEGATIVE
NITRITE: NEGATIVE
Ph: 5 (ref 4.5–8.0)
Protein: NEGATIVE
Specific Gravity: >1.05 — ABNORMAL HIGH (ref 1.003–1.030)

## 2014-09-16 LAB — LIPASE, BLOOD: Lipase: 30 U/L

## 2014-09-16 LAB — CK TOTAL AND CKMB (NOT AT ARMC)
CK, TOTAL: 45 U/L — AB
CK-MB: 2.5 ng/mL

## 2014-10-13 ENCOUNTER — Encounter: Payer: Medicare Other | Admitting: Surgery

## 2014-10-14 ENCOUNTER — Other Ambulatory Visit: Payer: Self-pay

## 2014-10-14 ENCOUNTER — Ambulatory Visit (INDEPENDENT_AMBULATORY_CARE_PROVIDER_SITE_OTHER): Payer: Medicare Other | Admitting: Surgery

## 2014-10-14 ENCOUNTER — Encounter: Payer: Self-pay | Admitting: Surgery

## 2014-10-14 VITALS — BP 116/63 | HR 90 | Temp 98.4°F | Ht 72.0 in | Wt 237.4 lb

## 2014-10-14 DIAGNOSIS — K259 Gastric ulcer, unspecified as acute or chronic, without hemorrhage or perforation: Secondary | ICD-10-CM | POA: Insufficient documentation

## 2014-10-14 DIAGNOSIS — IMO0002 Reserved for concepts with insufficient information to code with codable children: Secondary | ICD-10-CM | POA: Insufficient documentation

## 2014-10-14 DIAGNOSIS — E119 Type 2 diabetes mellitus without complications: Secondary | ICD-10-CM | POA: Insufficient documentation

## 2014-10-14 DIAGNOSIS — G500 Trigeminal neuralgia: Secondary | ICD-10-CM | POA: Insufficient documentation

## 2014-10-14 DIAGNOSIS — K631 Perforation of intestine (nontraumatic): Secondary | ICD-10-CM

## 2014-10-14 DIAGNOSIS — I10 Essential (primary) hypertension: Secondary | ICD-10-CM | POA: Insufficient documentation

## 2014-10-14 DIAGNOSIS — K810 Acute cholecystitis: Secondary | ICD-10-CM | POA: Insufficient documentation

## 2014-10-14 DIAGNOSIS — N400 Enlarged prostate without lower urinary tract symptoms: Secondary | ICD-10-CM | POA: Insufficient documentation

## 2014-10-14 HISTORY — DX: Trigeminal neuralgia: G50.0

## 2014-10-14 HISTORY — DX: Essential (primary) hypertension: I10

## 2014-10-14 HISTORY — DX: Gastric ulcer, unspecified as acute or chronic, without hemorrhage or perforation: K25.9

## 2014-10-14 NOTE — Progress Notes (Signed)
  Surgical Consultation  10/14/2014  Jack White is an 74 y.o. male.   Chief Complaint  Patient presents with  . 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 He's doing very well following that procedure which was required for a colon leak after his necrotic gallbladder surgery. He's doing well with no significant problems at the present time. 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 Colon perforation 05/28/2014  . Acute cholecystitis 05/21/14    Past Surgical History  Procedure Laterality Date  . Laparoscopic cholecystectomy  05/21/2014    Dr. Michela Pitcher  . Exploratory laparotomy  05/28/2014    Dr. Anda Kraft  . Colostomy  05/28/2014    Dr. Anda Kraft    Family History  Problem Relation Age of Onset  . Diabetes Mother   . Hypertension Mother   . Heart disease Father     Social History:  reports that he has never smoked. He has never used smokeless tobacco. He reports that he does not drink alcohol or use illicit drugs.  Allergies:  Allergies  Allergen Reactions  . Dilaudid [Hydromorphone Hcl] Other (See Comments)    Respiratory arrest  . Tomato Other (See Comments)    GI distress, respiratory distress    Medications reviewed.     Review of Systems  Constitutional: Negative.   HENT: Negative.   Eyes: Negative.   Respiratory: Negative.   Cardiovascular: Negative.   Gastrointestinal: Negative for heartburn, nausea, vomiting and abdominal pain.  Genitourinary: Negative.   Musculoskeletal: Negative.   Skin: Negative.   Neurological: Negative.   Endo/Heme/Allergies: Negative.   Psychiatric/Behavioral: Negative.        BP 116/63 mmHg  Pulse 90  Temp(Src) 98.4 F (36.9 C) (Oral)  Ht 6' (1.829 m)  Wt 237 lb 6.4 oz (107.684 kg)  BMI 32.19 kg/m2  Physical Exam  Constitutional: He is oriented to person, place, and time and well-developed,  well-nourished, and in no distress.  HENT:  Head: Normocephalic and atraumatic.  Eyes: Conjunctivae are normal. Pupils are equal, round, and reactive to light.  Neck: Normal range of motion. Neck supple.  Cardiovascular: Regular rhythm and normal heart sounds.   Pulmonary/Chest: Effort normal and breath sounds normal.  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 He exhibits no tenderness.  Neurological: He is alert and oriented to person, place, and time.  Skin: Skin is warm and dry.  Psychiatric: Memory and judgment normal.      No results found for this or any previous visit (from the past 48 hour(s)). No results found.  Assessment/Plan: We plan colostomy closure as outlined to him in detail. Risks benefits and options been outlined and accepted. We will set him up for a preoperative CT scan to review the anatomy. His wife was present for the interview. There are no diagnoses linked to this encounter.  Tiney Rouge III dermatitis

## 2014-10-14 NOTE — Patient Instructions (Signed)
You will need to have a CT scan prior to scheduling surgery. Central scheduling will call you within 3 business days with this appointment. If you would like to expedite this appointment scheduling, you may call [phone].  We will then schedule your surgery. Please see your pre-care (blue) form that you have been provided today.

## 2014-10-17 ENCOUNTER — Telehealth: Payer: Self-pay | Admitting: Surgery

## 2014-10-17 NOTE — Telephone Encounter (Signed)
Pt advised of pre op date/time and sx date. Sx: 10/27/14 with Dr Ely--Colostomy takedown Pre op: 10/19/14 @ 2:00pm-Office.   Inpatient.  Dr Thelma Barge to assist.

## 2014-10-17 NOTE — Telephone Encounter (Signed)
Sx date has changed to 10/26/14. Pt has been advised.  Colostomy take down.

## 2014-10-18 ENCOUNTER — Telehealth: Payer: Self-pay

## 2014-10-18 MED ORDER — FLEET ENEMA 7-19 GM/118ML RE ENEM: 1.0000 | ENEMA | Freq: Once | RECTAL | Status: DC

## 2014-10-18 MED ORDER — POLYETHYLENE GLYCOL 3350 17 GM/SCOOP PO POWD: 1.0000 | Freq: Once | ORAL | Status: DC

## 2014-10-18 MED ORDER — BISACODYL 5 MG PO TBEC: 20.0000 mg | DELAYED_RELEASE_TABLET | Freq: Once | ORAL | Status: DC

## 2014-10-18 NOTE — Telephone Encounter (Signed)
-----   Message from Nicole Kindred sent at 10/18/2014 10:02 AM EDT ----- Regarding: Bowel Prep Contact: [phone] Pt has been advised of sx date and pre op date and time.   Please call pt to discuss bowel prep dates/times.   SX Date: 10/26/14 with Dr Michela Pitcher, Dr Thelma Barge assisting.  Pre Op: 10/19/14 @ 2:00 in office.   Inpatient.

## 2014-10-18 NOTE — Telephone Encounter (Signed)
Called patient back at this time.   Reviewed dates to fill in the Colon Surgery Prep paperwork that the patient was given at his appointment with patient and patient's wife.  Prescriptions for prep sent to [facility] on [address]. Per patient request.

## 2014-10-19 ENCOUNTER — Encounter
Admission: RE | Admit: 2014-10-19 | Discharge: 2014-10-19 | Disposition: A | Discharge status: Home / Self Care | Payer: Medicare Other | Source: Ambulatory Visit | Attending: Surgery | Admitting: Surgery

## 2014-10-19 DIAGNOSIS — K631 Perforation of intestine (nontraumatic): Secondary | ICD-10-CM | POA: Diagnosis present

## 2014-10-19 HISTORY — DX: Type 2 diabetes mellitus without complications: E11.9

## 2014-10-19 HISTORY — DX: Headache: R51

## 2014-10-19 HISTORY — DX: Headache, unspecified: R51.9

## 2014-10-19 HISTORY — DX: Essential (primary) hypertension: I10

## 2014-10-19 LAB — CBC WITH DIFFERENTIAL/PLATELET
BASOS PCT: 1 %
Basophils Absolute: 0 10*3/uL (ref 0–0.1)
EOS ABS: 0.8 10*3/uL — AB (ref 0–0.7)
Eosinophils Relative: 9 %
HCT: 39.3 % — ABNORMAL LOW (ref 40.0–52.0)
HEMOGLOBIN: 13 g/dL (ref 13.0–18.0)
Lymphocytes Relative: 28 %
Lymphs Abs: 2.3 10*3/uL (ref 1.0–3.6)
MCH: 30.6 pg (ref 26.0–34.0)
MCHC: 32.9 g/dL (ref 32.0–36.0)
MCV: 93.1 fL (ref 80.0–100.0)
Monocytes Absolute: 0.6 10*3/uL (ref 0.2–1.0)
Monocytes Relative: 8 %
NEUTROS PCT: 54 %
Neutro Abs: 4.5 10*3/uL (ref 1.4–6.5)
Platelets: 187 10*3/uL (ref 150–440)
RBC: 4.23 MIL/uL — AB (ref 4.40–5.90)
RDW: 13.7 % (ref 11.5–14.5)
WBC: 8.3 10*3/uL (ref 3.8–10.6)

## 2014-10-19 LAB — BASIC METABOLIC PANEL
Anion gap: 7 (ref 5–15)
BUN: 21 mg/dL — ABNORMAL HIGH (ref 6–20)
CALCIUM: 8.9 mg/dL (ref 8.9–10.3)
CO2: 27 mmol/L (ref 22–32)
CREATININE: 1.73 mg/dL — AB (ref 0.61–1.24)
Chloride: 108 mmol/L (ref 101–111)
GFR calc non Af Amer: 37 mL/min — ABNORMAL LOW (ref >60)
GFR, EST AFRICAN AMERICAN: 43 mL/min — AB (ref >60)
Glucose, Bld: 147 mg/dL — ABNORMAL HIGH (ref 65–99)
Potassium: 4.7 mmol/L (ref 3.5–5.1)
SODIUM: 142 mmol/L (ref 135–145)

## 2014-10-19 LAB — SURGICAL PCR SCREEN
MRSA, PCR: NEGATIVE
STAPHYLOCOCCUS AUREUS: NEGATIVE

## 2014-10-19 NOTE — Patient Instructions (Addendum)
  Your procedure is scheduled on: Wednesday 10/26/2014 Report to Day Surgery. 2ND FLOOR MEDIAL MALL ENTRANCE To find out your arrival time please call [phone] between 1PM - 3PM on Tuesday 10/25/2014.  Remember: Instructions that are not followed completely may result in serious medical risk, up to and including death, or upon the discretion of your surgeon and anesthesiologist your surgery may need to be rescheduled.    __X__ 1. Do not eat food or drink liquids after midnight. No gum chewing or hard candies.     __X__ 2. No Alcohol for 24 hours before or after surgery.   ____ 3. Bring all medications with you on the day of surgery if instructed.    __X__ 4. Notify your doctor if there is any change in your medical condition     (cold, fever, infections).     Do not wear jewelry, make-up, hairpins, clips or nail polish.  Do not wear lotions, powders, or perfumes.   Do not shave 48 hours prior to surgery. Men may shave face and neck.  Do not bring valuables to the hospital.    [facility] is not responsible for any belongings or valuables.               Contacts, dentures or bridgework may not be worn into surgery.  Leave your suitcase in the car. After surgery it may be brought to your room.  For patients admitted to the hospital, discharge time is determined by your                treatment team.   Patients discharged the day of surgery will not be allowed to drive home.   Please read over the following fact sheets that you were given:   MRSA Information and Surgical Site Infection Prevention   __X__ Take these medicines the morning of surgery with A SIP OF WATER:    1. DOXAZOSIN  2. LISINOPRIL  3.   4.  5.  6.  __X__ Fleet Enema (as directed) BOWEL PREP  __X__ Use CHG Soap as directed  ____ Use inhalers on the day of surgery  __X__ Stop metformin 2 days prior to surgery    ____ Take 1/2 of usual insulin dose the night before surgery and none on the morning of  surgery.   __X__ Stop Coumadin/Plavix/aspirin on 10/20/2014  __X__ Stop Anti-inflammatories on 10/20/2014  STOP CELEBREX IF YOU ARE TAKING THIS   __X__ Stop supplements until after surgery.   STOP MELATONIN UNTIL AFTER SURGERY  ____ Bring C-Pap to the hospital.

## 2014-10-21 ENCOUNTER — Ambulatory Visit
Admission: RE | Admit: 2014-10-21 | Discharge: 2014-10-21 | Disposition: A | Discharge status: Home / Self Care | Payer: Medicare Other | Source: Ambulatory Visit | Attending: Surgery | Admitting: Surgery

## 2014-10-21 DIAGNOSIS — K631 Perforation of intestine (nontraumatic): Secondary | ICD-10-CM | POA: Insufficient documentation

## 2014-10-21 MED ORDER — IOHEXOL 350 MG/ML SOLN: 100.0000 mL | Freq: Once | INTRAVENOUS | Status: DC | PRN

## 2014-10-21 MED ORDER — IOHEXOL 300 MG/ML  SOLN
100.0000 mL | Freq: Once | INTRAMUSCULAR | Status: AC | PRN
  Administered 2014-10-21: 80 mL via INTRAVENOUS

## 2014-10-26 ENCOUNTER — Ambulatory Visit: Payer: Medicare Other | Admitting: Anesthesiology

## 2014-10-26 ENCOUNTER — Inpatient Hospital Stay
Admission: AD | Admit: 2014-10-26 | Discharge: 2014-11-01 | DRG: 330 | Disposition: A | Discharge status: Home / Self Care | Payer: Medicare Other | Source: Ambulatory Visit | Attending: Surgery | Admitting: Surgery

## 2014-10-26 ENCOUNTER — Encounter
Admission: AD | Disposition: A | Discharge status: Home / Self Care | Payer: Self-pay | Source: Ambulatory Visit | Attending: Surgery

## 2014-10-26 ENCOUNTER — Encounter: Payer: Self-pay | Admitting: Anesthesiology

## 2014-10-26 DIAGNOSIS — Z933 Colostomy status: Secondary | ICD-10-CM

## 2014-10-26 DIAGNOSIS — I10 Essential (primary) hypertension: Secondary | ICD-10-CM | POA: Diagnosis present

## 2014-10-26 DIAGNOSIS — N179 Acute kidney failure, unspecified: Secondary | ICD-10-CM | POA: Diagnosis not present

## 2014-10-26 DIAGNOSIS — K567 Ileus, unspecified: Secondary | ICD-10-CM | POA: Diagnosis not present

## 2014-10-26 DIAGNOSIS — D72829 Elevated white blood cell count, unspecified: Secondary | ICD-10-CM | POA: Diagnosis present

## 2014-10-26 DIAGNOSIS — I25119 Atherosclerotic heart disease of native coronary artery with unspecified angina pectoris: Secondary | ICD-10-CM | POA: Diagnosis present

## 2014-10-26 DIAGNOSIS — G4700 Insomnia, unspecified: Secondary | ICD-10-CM | POA: Diagnosis not present

## 2014-10-26 DIAGNOSIS — Z433 Encounter for attention to colostomy: Secondary | ICD-10-CM | POA: Diagnosis present

## 2014-10-26 DIAGNOSIS — Z9049 Acquired absence of other specified parts of digestive tract: Secondary | ICD-10-CM | POA: Diagnosis present

## 2014-10-26 DIAGNOSIS — E860 Dehydration: Secondary | ICD-10-CM | POA: Diagnosis present

## 2014-10-26 DIAGNOSIS — K219 Gastro-esophageal reflux disease without esophagitis: Secondary | ICD-10-CM | POA: Diagnosis present

## 2014-10-26 DIAGNOSIS — K660 Peritoneal adhesions (postprocedural) (postinfection): Secondary | ICD-10-CM | POA: Diagnosis present

## 2014-10-26 DIAGNOSIS — R110 Nausea: Secondary | ICD-10-CM | POA: Diagnosis not present

## 2014-10-26 DIAGNOSIS — I252 Old myocardial infarction: Secondary | ICD-10-CM | POA: Diagnosis not present

## 2014-10-26 DIAGNOSIS — E119 Type 2 diabetes mellitus without complications: Secondary | ICD-10-CM | POA: Diagnosis present

## 2014-10-26 DIAGNOSIS — N39498 Other specified urinary incontinence: Secondary | ICD-10-CM | POA: Diagnosis not present

## 2014-10-26 DIAGNOSIS — K279 Peptic ulcer, site unspecified, unspecified as acute or chronic, without hemorrhage or perforation: Secondary | ICD-10-CM | POA: Diagnosis present

## 2014-10-26 DIAGNOSIS — Z91018 Allergy to other foods: Secondary | ICD-10-CM | POA: Diagnosis not present

## 2014-10-26 DIAGNOSIS — Z833 Family history of diabetes mellitus: Secondary | ICD-10-CM | POA: Diagnosis not present

## 2014-10-26 DIAGNOSIS — Z955 Presence of coronary angioplasty implant and graft: Secondary | ICD-10-CM

## 2014-10-26 DIAGNOSIS — Z951 Presence of aortocoronary bypass graft: Secondary | ICD-10-CM | POA: Diagnosis not present

## 2014-10-26 DIAGNOSIS — Z885 Allergy status to narcotic agent status: Secondary | ICD-10-CM

## 2014-10-26 DIAGNOSIS — R339 Retention of urine, unspecified: Secondary | ICD-10-CM | POA: Diagnosis not present

## 2014-10-26 DIAGNOSIS — Z8249 Family history of ischemic heart disease and other diseases of the circulatory system: Secondary | ICD-10-CM

## 2014-10-26 HISTORY — PX: COLOSTOMY TAKEDOWN: SHX5783

## 2014-10-26 LAB — CREATININE, SERUM
CREATININE: 1.77 mg/dL — AB (ref 0.61–1.24)
GFR calc Af Amer: 42 mL/min — ABNORMAL LOW (ref >60)
GFR, EST NON AFRICAN AMERICAN: 36 mL/min — AB (ref >60)

## 2014-10-26 LAB — CBC
HCT: 40.8 % (ref 40.0–52.0)
HEMOGLOBIN: 13.3 g/dL (ref 13.0–18.0)
MCH: 30.4 pg (ref 26.0–34.0)
MCHC: 32.6 g/dL (ref 32.0–36.0)
MCV: 93.1 fL (ref 80.0–100.0)
PLATELETS: 162 10*3/uL (ref 150–440)
RBC: 4.38 MIL/uL — AB (ref 4.40–5.90)
RDW: 13.2 % (ref 11.5–14.5)
WBC: 16.1 10*3/uL — ABNORMAL HIGH (ref 3.8–10.6)

## 2014-10-26 LAB — GLUCOSE, CAPILLARY
GLUCOSE-CAPILLARY: 154 mg/dL — AB (ref 65–99)
GLUCOSE-CAPILLARY: 152 mg/dL — AB (ref 65–99)
GLUCOSE-CAPILLARY: 159 mg/dL — AB (ref 65–99)
Glucose-Capillary: 168 mg/dL — ABNORMAL HIGH (ref 65–99)

## 2014-10-26 SURGERY — CLOSURE, COLOSTOMY
Anesthesia: General

## 2014-10-26 MED ORDER — ACETAMINOPHEN 10 MG/ML IV SOLN
INTRAVENOUS | Status: AC
  Filled 2014-10-26: qty 100

## 2014-10-26 MED ORDER — LIDOCAINE HCL (CARDIAC) 20 MG/ML IV SOLN
INTRAVENOUS | Status: DC | PRN
  Administered 2014-10-26 (×2): 100 mg via INTRAVENOUS

## 2014-10-26 MED ORDER — LISINOPRIL 5 MG PO TABS
2.5000 mg | ORAL_TABLET | Freq: Every day | ORAL | Status: DC
  Filled 2014-10-26: qty 1

## 2014-10-26 MED ORDER — HYDROXYZINE HCL 10 MG PO TABS: 10.0000 mg | ORAL_TABLET | Freq: Four times a day (QID) | ORAL | Status: DC | PRN

## 2014-10-26 MED ORDER — LORAZEPAM 2 MG/ML IJ SOLN
1.0000 mg | INTRAMUSCULAR | Status: DC | PRN
  Administered 2014-10-26: 1 mg via INTRAVENOUS
  Filled 2014-10-26: qty 1

## 2014-10-26 MED ORDER — ACETAMINOPHEN 10 MG/ML IV SOLN
INTRAVENOUS | Status: DC | PRN
  Administered 2014-10-26: 1000 mg via INTRAVENOUS

## 2014-10-26 MED ORDER — ONDANSETRON HCL 4 MG/2ML IJ SOLN
INTRAMUSCULAR | Status: AC
Start: 2014-10-26 — End: 2014-10-26
  Administered 2014-10-26: 4 mg via INTRAVENOUS
  Filled 2014-10-26: qty 2

## 2014-10-26 MED ORDER — LIDOCAINE HCL (PF) 1 % IJ SOLN
INTRAMUSCULAR | Status: AC
  Administered 2014-10-26: 2 mL via SUBCUTANEOUS
  Filled 2014-10-26: qty 2

## 2014-10-26 MED ORDER — ROCURONIUM BROMIDE 100 MG/10ML IV SOLN
INTRAVENOUS | Status: DC | PRN
  Administered 2014-10-26: 50 mg via INTRAVENOUS
  Administered 2014-10-26 (×3): 10 mg via INTRAVENOUS

## 2014-10-26 MED ORDER — ONDANSETRON HCL 4 MG PO TABS
4.0000 mg | ORAL_TABLET | ORAL | Status: DC | PRN
  Filled 2014-10-26: qty 1

## 2014-10-26 MED ORDER — FENTANYL CITRATE (PF) 100 MCG/2ML IJ SOLN
INTRAMUSCULAR | Status: AC
  Administered 2014-10-26: 25 ug via INTRAVENOUS
  Filled 2014-10-26: qty 2

## 2014-10-26 MED ORDER — CHLORHEXIDINE GLUCONATE 4 % EX LIQD: 1.0000 "application " | Freq: Once | CUTANEOUS | Status: DC

## 2014-10-26 MED ORDER — ATORVASTATIN CALCIUM 20 MG PO TABS
10.0000 mg | ORAL_TABLET | Freq: Every day | ORAL | Status: DC
  Administered 2014-10-26 – 2014-10-31 (×6): 10 mg via ORAL
  Filled 2014-10-26 (×6): qty 1

## 2014-10-26 MED ORDER — SODIUM CHLORIDE 0.9 % IV SOLN
INTRAVENOUS | Status: DC
  Administered 2014-10-26: 09:00:00 via INTRAVENOUS

## 2014-10-26 MED ORDER — PHENYLEPHRINE HCL 10 MG/ML IJ SOLN
10.0000 mg | INTRAMUSCULAR | Status: DC | PRN
  Administered 2014-10-26: 20 ug/min via INTRAVENOUS

## 2014-10-26 MED ORDER — MORPHINE SULFATE (PF) 4 MG/ML IV SOLN
4.0000 mg | INTRAVENOUS | Status: DC | PRN
  Administered 2014-10-26 – 2014-10-27 (×3): 4 mg via INTRAVENOUS
  Filled 2014-10-26 (×4): qty 1

## 2014-10-26 MED ORDER — PROPOFOL 10 MG/ML IV BOLUS
INTRAVENOUS | Status: DC | PRN
  Administered 2014-10-26: 50 mg via INTRAVENOUS

## 2014-10-26 MED ORDER — ONDANSETRON HCL 4 MG PO TABS: 4.0000 mg | ORAL_TABLET | Freq: Four times a day (QID) | ORAL | Status: DC | PRN

## 2014-10-26 MED ORDER — ALVIMOPAN 12 MG PO CAPS
12.0000 mg | ORAL_CAPSULE | Freq: Two times a day (BID) | ORAL | Status: DC
  Administered 2014-10-27 – 2014-10-31 (×8): 12 mg via ORAL
  Filled 2014-10-26 (×10): qty 1

## 2014-10-26 MED ORDER — PROMETHAZINE HCL 25 MG/ML IJ SOLN
12.5000 mg | Freq: Four times a day (QID) | INTRAMUSCULAR | Status: DC | PRN
  Administered 2014-10-27 – 2014-10-28 (×4): 12.5 mg via INTRAVENOUS
  Filled 2014-10-26 (×4): qty 1

## 2014-10-26 MED ORDER — METOPROLOL TARTRATE 1 MG/ML IV SOLN
INTRAVENOUS | Status: DC | PRN
  Administered 2014-10-26: 3 mg via INTRAVENOUS

## 2014-10-26 MED ORDER — ALVIMOPAN 12 MG PO CAPS
ORAL_CAPSULE | ORAL | Status: AC
  Filled 2014-10-26: qty 1

## 2014-10-26 MED ORDER — ENOXAPARIN SODIUM 40 MG/0.4ML ~~LOC~~ SOLN
40.0000 mg | Freq: Once | SUBCUTANEOUS | Status: AC
  Administered 2014-10-26: 40 mg via SUBCUTANEOUS
  Filled 2014-10-26: qty 0.4

## 2014-10-26 MED ORDER — NEOSTIGMINE METHYLSULFATE 10 MG/10ML IV SOLN
INTRAVENOUS | Status: DC | PRN
  Administered 2014-10-26: 4 mg via INTRAVENOUS

## 2014-10-26 MED ORDER — ZOLPIDEM TARTRATE 5 MG PO TABS: 5.0000 mg | ORAL_TABLET | Freq: Every evening | ORAL | Status: DC | PRN

## 2014-10-26 MED ORDER — ONDANSETRON HCL 4 MG/2ML IJ SOLN
4.0000 mg | Freq: Four times a day (QID) | INTRAMUSCULAR | Status: DC | PRN
  Administered 2014-10-26: 4 mg via INTRAVENOUS
  Filled 2014-10-26 (×2): qty 2

## 2014-10-26 MED ORDER — LACTATED RINGERS IV SOLN
INTRAVENOUS | Status: DC | PRN
  Administered 2014-10-26: 11:00:00 via INTRAVENOUS

## 2014-10-26 MED ORDER — ONDANSETRON HCL 4 MG/2ML IJ SOLN
4.0000 mg | Freq: Once | INTRAMUSCULAR | Status: AC | PRN
  Administered 2014-10-26: 4 mg via INTRAVENOUS

## 2014-10-26 MED ORDER — FENTANYL CITRATE (PF) 100 MCG/2ML IJ SOLN
INTRAMUSCULAR | Status: DC | PRN
  Administered 2014-10-26: 100 ug via INTRAVENOUS
  Administered 2014-10-26: 250 ug via INTRAVENOUS
  Administered 2014-10-26: 50 ug via INTRAVENOUS
  Administered 2014-10-26: 100 ug via INTRAVENOUS

## 2014-10-26 MED ORDER — INSULIN ASPART 100 UNIT/ML ~~LOC~~ SOLN
0.0000 [IU] | Freq: Three times a day (TID) | SUBCUTANEOUS | Status: DC
  Administered 2014-10-27 (×2): 2 [IU] via SUBCUTANEOUS
  Filled 2014-10-26 (×2): qty 2

## 2014-10-26 MED ORDER — ONDANSETRON HCL 4 MG/2ML IJ SOLN
4.0000 mg | INTRAMUSCULAR | Status: DC | PRN
  Administered 2014-10-26 – 2014-10-27 (×2): 4 mg via INTRAVENOUS
  Filled 2014-10-26: qty 2

## 2014-10-26 MED ORDER — FENTANYL CITRATE (PF) 100 MCG/2ML IJ SOLN
25.0000 ug | INTRAMUSCULAR | Status: AC | PRN
  Administered 2014-10-26 (×6): 25 ug via INTRAVENOUS

## 2014-10-26 MED ORDER — GLYCOPYRROLATE 0.2 MG/ML IJ SOLN
INTRAMUSCULAR | Status: DC | PRN
  Administered 2014-10-26: 0.6 mg via INTRAVENOUS

## 2014-10-26 MED ORDER — SCOPOLAMINE 1 MG/3DAYS TD PT72
1.0000 | MEDICATED_PATCH | TRANSDERMAL | Status: DC
  Administered 2014-10-26 – 2014-11-01 (×3): 1.5 mg via TRANSDERMAL
  Filled 2014-10-26 (×3): qty 1

## 2014-10-26 MED ORDER — DOXAZOSIN MESYLATE 2 MG PO TABS
2.0000 mg | ORAL_TABLET | Freq: Two times a day (BID) | ORAL | Status: DC
  Administered 2014-10-26 – 2014-11-01 (×11): 2 mg via ORAL
  Filled 2014-10-26 (×12): qty 1

## 2014-10-26 MED ORDER — SODIUM CHLORIDE 0.9 % IR SOLN
Status: DC | PRN
  Administered 2014-10-26: 2000 mL

## 2014-10-26 MED ORDER — SCOPOLAMINE 1 MG/3DAYS TD PT72
MEDICATED_PATCH | TRANSDERMAL | Status: AC
  Administered 2014-10-26: 1.5 mg via TRANSDERMAL
  Filled 2014-10-26: qty 1

## 2014-10-26 MED ORDER — SODIUM CHLORIDE 0.9 % IV SOLN
1.0000 g | INTRAVENOUS | Status: AC
  Administered 2014-10-26: 10:00:00 via INTRAVENOUS
  Filled 2014-10-26: qty 1

## 2014-10-26 MED ORDER — MAGNESIUM HYDROXIDE 400 MG/5ML PO SUSP
30.0000 mL | ORAL | Status: DC | PRN
  Filled 2014-10-26: qty 30

## 2014-10-26 MED ORDER — ALVIMOPAN 12 MG PO CAPS
12.0000 mg | ORAL_CAPSULE | Freq: Once | ORAL | Status: AC
  Administered 2014-10-26: 09:00:00 via ORAL

## 2014-10-26 MED ORDER — HYDROCODONE-ACETAMINOPHEN 5-325 MG PO TABS: 1.0000 | ORAL_TABLET | Freq: Four times a day (QID) | ORAL | Status: DC | PRN

## 2014-10-26 MED ORDER — ACETAMINOPHEN 325 MG PO TABS
650.0000 mg | ORAL_TABLET | Freq: Four times a day (QID) | ORAL | Status: DC | PRN
  Administered 2014-10-27: 650 mg via ORAL
  Filled 2014-10-26: qty 2

## 2014-10-26 MED ORDER — MORPHINE SULFATE (PF) 4 MG/ML IV SOLN
4.0000 mg | INTRAVENOUS | Status: DC | PRN
  Administered 2014-10-26: 4 mg via INTRAVENOUS
  Filled 2014-10-26: qty 1

## 2014-10-26 MED ORDER — ALUM & MAG HYDROXIDE-SIMETH 200-200-20 MG/5ML PO SUSP
30.0000 mL | ORAL | Status: DC | PRN
  Administered 2014-10-26 – 2014-11-01 (×6): 30 mL via ORAL
  Filled 2014-10-26 (×6): qty 30

## 2014-10-26 MED ORDER — PHENYLEPHRINE HCL 10 MG/ML IJ SOLN
INTRAMUSCULAR | Status: DC | PRN
  Administered 2014-10-26 (×2): 100 ug via INTRAVENOUS
  Administered 2014-10-26: 200 ug via INTRAVENOUS

## 2014-10-26 MED ORDER — ENOXAPARIN SODIUM 40 MG/0.4ML ~~LOC~~ SOLN
40.0000 mg | SUBCUTANEOUS | Status: DC
  Administered 2014-10-27 – 2014-10-31 (×5): 40 mg via SUBCUTANEOUS
  Filled 2014-10-26 (×5): qty 0.4

## 2014-10-26 MED ORDER — POTASSIUM CHLORIDE IN NACL 20-0.45 MEQ/L-% IV SOLN
INTRAVENOUS | Status: DC
  Administered 2014-10-26: 1000 mL via INTRAVENOUS
  Administered 2014-10-27: 02:00:00 via INTRAVENOUS
  Filled 2014-10-26 (×6): qty 1000

## 2014-10-26 MED ORDER — ONDANSETRON HCL 4 MG/2ML IJ SOLN
INTRAMUSCULAR | Status: DC | PRN
  Administered 2014-10-26: 4 mg via INTRAVENOUS

## 2014-10-26 MED ORDER — ZOLPIDEM TARTRATE 5 MG PO TABS
2.5000 mg | ORAL_TABLET | Freq: Every evening | ORAL | Status: DC | PRN
  Administered 2014-10-26 – 2014-10-31 (×2): 2.5 mg via ORAL
  Filled 2014-10-26 (×2): qty 1

## 2014-10-26 SURGICAL SUPPLY — 44 items
CANISTER SUCT 1200ML W/VALVE (MISCELLANEOUS) ×3 IMPLANT
CATH TRAY 16F METER LATEX (MISCELLANEOUS) IMPLANT
DRAIN PENROSE 1/4X12 LTX (DRAIN) ×3 IMPLANT
DRAPE INCISE IOBAN 66X45 STRL (DRAPES) ×3 IMPLANT
DRAPE LAPAROTOMY 100X77 ABD (DRAPES) ×3 IMPLANT
DRAPE LEGGINS SURG 28X43 STRL (DRAPES) IMPLANT
DRAPE TABLE BACK 80X90 (DRAPES) ×3 IMPLANT
DRAPE UNDER BUTTOCK W/FLU (DRAPES) IMPLANT
DRAPE UTILITY 15X26 TOWEL STRL (DRAPES) ×6 IMPLANT
DRSG OPSITE POSTOP 4X14 (GAUZE/BANDAGES/DRESSINGS) IMPLANT
DRSG OPSITE POSTOP 4X6 (GAUZE/BANDAGES/DRESSINGS) IMPLANT
ELECT BLADE 6.5 EXT (BLADE) ×3 IMPLANT
ELECT CAUTERY BLADE 6.4 (BLADE) ×3 IMPLANT
GAUZE SPONGE 4X4 12PLY STRL (GAUZE/BANDAGES/DRESSINGS) ×3 IMPLANT
GLOVE BIO SURGEON STRL SZ7.5 (GLOVE) ×9 IMPLANT
GLOVE INDICATOR 8.0 STRL GRN (GLOVE) ×9 IMPLANT
GOWN STRL REUS W/ TWL LRG LVL3 (GOWN DISPOSABLE) ×3 IMPLANT
GOWN STRL REUS W/ TWL XL LVL3 (GOWN DISPOSABLE) IMPLANT
GOWN STRL REUS W/TWL LRG LVL3 (GOWN DISPOSABLE) ×6
GOWN STRL REUS W/TWL XL LVL3 (GOWN DISPOSABLE)
LABEL OR SOLS (LABEL) ×3 IMPLANT
LIGASURE 5MM LAPAROSCOPIC (INSTRUMENTS) IMPLANT
NS IRRIG 1000ML POUR BTL (IV SOLUTION) ×6 IMPLANT
NS IRRIG 500ML POUR BTL (IV SOLUTION) ×6 IMPLANT
PACK BASIN MAJOR ARMC (MISCELLANEOUS) ×3 IMPLANT
PACK COLON CLEAN CLOSURE (MISCELLANEOUS) ×3 IMPLANT
PAD ABD DERMACEA PRESS 5X9 (GAUZE/BANDAGES/DRESSINGS) ×6 IMPLANT
PAD GROUND ADULT SPLIT (MISCELLANEOUS) ×3 IMPLANT
RETAINER VISCERA MED (MISCELLANEOUS) IMPLANT
SOL PREP PVP 2OZ (MISCELLANEOUS)
SOLUTION PREP PVP 2OZ (MISCELLANEOUS) IMPLANT
SPONGE LAP 18X18 5 PK (GAUZE/BANDAGES/DRESSINGS) ×3 IMPLANT
STAPLER CUT CVD 40MM GREEN (STAPLE) ×3 IMPLANT
STAPLER CUT RELOAD GREEN (STAPLE) ×3 IMPLANT
STAPLER SKIN PROX 35W (STAPLE) ×3 IMPLANT
SUT MAXON ABS #0 GS21 30IN (SUTURE) ×9 IMPLANT
SUT NYLON 2-0 (SUTURE) IMPLANT
SUT PDS AB 1 TP1 96 (SUTURE) ×6 IMPLANT
SUT SILK 3-0 (SUTURE) ×9 IMPLANT
SUT VIC AB 1 CTX 27 (SUTURE) IMPLANT
SUT VIC AB 3-0 SH 27 (SUTURE) ×4
SUT VIC AB 3-0 SH 27X BRD (SUTURE) ×2 IMPLANT
SUT VICRYL PLUS ABS 0 54 (SUTURE) ×3 IMPLANT
SYR BULB IRRIG 60ML STRL (SYRINGE) ×3 IMPLANT

## 2014-10-26 NOTE — Anesthesia Procedure Notes (Signed)
Procedure Name: Intubation Date/Time: 10/26/2014 10:17 AM Performed by: Shirlee Limerick, Deryl Ports Pre-anesthesia Checklist: Patient identified, Emergency Drugs available, Suction available and Patient being monitored Patient Re-evaluated:Patient Re-evaluated prior to inductionOxygen Delivery Method: Circle system utilized Preoxygenation: Pre-oxygenation with 100% oxygen Intubation Type: IV induction Laryngoscope Size: Mac and 3 Grade View: Grade I Number of attempts: 1 Placement Confirmation: ETT inserted through vocal cords under direct vision,  positive ETCO2 and breath sounds checked- equal and bilateral Secured at: 23 cm Tube secured with: Tape Dental Injury: Teeth and Oropharynx as per pre-operative assessment

## 2014-10-26 NOTE — OR Nursing (Signed)
Pt. Arrived with heart rate of 145.Heart rate regular and pt. Placed on bedside ekg monitor. Stat Ekg ordered and ordered and DR. Karlton Lemon aware.

## 2014-10-26 NOTE — Anesthesia Preprocedure Evaluation (Addendum)
Anesthesia Evaluation  Patient identified by MRN, date of birth, ID band Patient awake    Reviewed: Allergy & Precautions, H&P , NPO status , Patient's Chart, lab work & pertinent test results, reviewed documented beta blocker date and time   History of Anesthesia Complications (+) PONV and history of anesthetic complications  Airway Mallampati: I  TM Distance: >3 FB Neck ROM: full    Dental no notable dental hx. (+) Edentulous Upper, Edentulous Lower, Upper Dentures, Lower Dentures   Pulmonary neg pulmonary ROS,  breath sounds clear to auscultation  Pulmonary exam normal       Cardiovascular Exercise Tolerance: Good hypertension, - angina- CAD, - Past MI, - Cardiac Stents and - CABG Normal cardiovascular exam- dysrhythmias - Valvular Problems/MurmursRhythm:regular Rate:Normal     Neuro/Psych negative psych ROS   GI/Hepatic Neg liver ROS, PUD, GERD-  Medicated and Controlled,  Endo/Other  diabetes  Renal/GU CRFRenal disease  negative genitourinary   Musculoskeletal   Abdominal   Peds  Hematology negative hematology ROS (+)   Anesthesia Other Findings Past Medical History:   Colon perforation                               05/28/2014     Acute cholecystitis                             05/21/14      Hypertension                                                 Headache                                                     Diabetes mellitus without complication                       Reproductive/Obstetrics negative OB ROS                           Anesthesia Physical Anesthesia Plan  ASA: III  Anesthesia Plan: General   Post-op Pain Management:    Induction:   Airway Management Planned:   Additional Equipment:   Intra-op Plan:   Post-operative Plan:   Informed Consent: I have reviewed the patients History and Physical, chart, labs and discussed the procedure including the risks,  benefits and alternatives for the proposed anesthesia with the patient or authorized representative who has indicated his/her understanding and acceptance.   Dental Advisory Given  Plan Discussed with: Anesthesiologist, CRNA and Surgeon  Anesthesia Plan Comments:        Anesthesia Quick Evaluation

## 2014-10-26 NOTE — Consult Note (Signed)
[facility] [place] [facility]   PATIENT NAME: Jack White    MR#:  [id number]  DATE OF BIRTH:  01-11-1941  DATE OF ADMISSION:  10/26/2014  PRIMARY CARE PHYSICIAN: Clydie Braun, MD   REQUESTING/REFERRING PHYSICIAN: Dr. Michela Pitcher  Reason For consult- Diabetes management.  HISTORY OF PRESENT ILLNESS: Jack White  is a 74 y.o. male with a known history of colon perforation status post cholecystectomy a few months ago, hypertension, diabetes- was admitted to surgical service today after having scheduled surgery for reversal of his cholecystectomy. He has end to end anastomosis done today, and medical consult is called in to manage his diabetes.  Patient denies any complaints, said his pain is under control by his current regimen. He feels slightly nauseous but Zofran is helping with that.  PAST MEDICAL HISTORY:   Past Medical History  Diagnosis Date  . Colon perforation 05/28/2014  . Acute cholecystitis 05/21/14  . Hypertension   . Headache   . Diabetes mellitus without complication     PAST SURGICAL HISTORY:  Past Surgical History  Procedure Laterality Date  . Laparoscopic cholecystectomy  05/21/2014    Dr. Michela Pitcher  . Exploratory laparotomy  05/28/2014    Dr. Anda Kraft  . Colostomy  05/28/2014    Dr. Anda Kraft  . Colostomy takedown N/A 10/26/2014    Procedure: COLOSTOMY TAKEDOWN;  Surgeon: Tiney Rouge III, MD;  Location: [facility] ORS;  Service: General;  Laterality: N/A;    SOCIAL HISTORY:  Social History  Substance Use Topics  . Smoking status: Never Smoker   . Smokeless tobacco: Never Used  . Alcohol Use: No    FAMILY HISTORY:  Family History  Problem Relation Age of Onset  . Diabetes Mother   . Hypertension Mother   . Heart disease Father     DRUG ALLERGIES:  Allergies  Allergen Reactions  . Dilaudid [Hydromorphone Hcl] Other (See Comments)    Respiratory arrest  . Tomato Other (See Comments)    GI distress, respiratory distress    REVIEW  OF SYSTEMS:   CONSTITUTIONAL: No fever, fatigue or weakness.  EYES: No blurred or double vision.  EARS, NOSE, AND THROAT: No tinnitus or ear pain.  RESPIRATORY: No cough, shortness of breath, wheezing or hemoptysis.  CARDIOVASCULAR: No chest pain, orthopnea, edema.  GASTROINTESTINAL: No nausea, vomiting, diarrhea , mild abdominal pain - post surgical.  GENITOURINARY: No dysuria, hematuria.  ENDOCRINE: No polyuria, nocturia,  HEMATOLOGY: No anemia, easy bruising or bleeding SKIN: No rash or lesion. MUSCULOSKELETAL: No joint pain or arthritis.   NEUROLOGIC: No tingling, numbness, weakness.  PSYCHIATRY: No anxiety or depression.   MEDICATIONS AT HOME:  Prior to Admission medications   Medication Sig Start Date End Date Taking? Authorizing Provider  bisacodyl (DULCOLAX) 5 MG EC tablet Take 4 tablets (20 mg total) by mouth once. 10/18/14  Yes Tiney Rouge III, MD  butalbital-acetaminophen-caffeine [facility]) [phone] MG per tablet Take 1-2 tablets by mouth every 6 (six) hours as needed for headache. 08/04/14 08/04/15 Yes Tiney Rouge III, MD  doxazosin (CARDURA) 2 MG tablet Take 2 mg by mouth 2 (two) times daily.   Yes Historical Provider, MD  HYDROcodone-acetaminophen (NORCO) 5-325 MG per tablet Take 1 tablet by mouth every 6 (six) hours as needed for moderate pain. 06/26/14  Yes Natale Lay, MD  hydrocortisone cream 1 % Apply topically 3 (three) times daily. 06/26/14  Yes Natale Lay, MD  lisinopril (PRINIVIL,ZESTRIL) 2.5 MG tablet  10/12/14  Yes Historical Provider, MD  loperamide (IMODIUM) 2 MG capsule Take 1 capsule (2 mg total) by mouth as needed for diarrhea or loose stools (loose stools via ostomy.). 06/26/14  Yes Natale Lay, MD  Melatonin 10 MG CAPS Take 1 capsule by mouth at bedtime.   Yes Historical Provider, MD  polyethylene glycol powder (GLYCOLAX/MIRALAX) powder Take 255 g by mouth once. 10/18/14  Yes Tiney Rouge III, MD  sodium phosphate (FLEET) 7-19 GM/118ML ENEM Place 133 mLs (1 enema total) rectally  once. 10/18/14  Yes Tiney Rouge III, MD  aspirin 81 MG tablet Take 81 mg by mouth daily.    Historical Provider, MD  atorvastatin (LIPITOR) 10 MG tablet  10/03/14   Historical Provider, MD  celecoxib (CELEBREX) 200 MG capsule Take 200 mg by mouth 2 (two) times daily.    Historical Provider, MD  feeding supplement, ENSURE ENLIVE, (ENSURE ENLIVE) LIQD Take 237 mLs by mouth 3 (three) times daily with meals. Patient not taking: Reported on 10/19/2014 06/26/14   Natale Lay, MD  hydrOXYzine (ATARAX/VISTARIL) 10 MG tablet Take 1 tablet (10 mg total) by mouth every 6 (six) hours as needed for itching. 06/26/14   Natale Lay, MD  metFORMIN (GLUCOPHAGE) 1000 MG tablet Take 1,000 mg by mouth every morning.    Historical Provider, MD  zolpidem (AMBIEN) 5 MG tablet Take 1 tablet (5 mg total) by mouth at bedtime as needed for sleep. 06/26/14   Natale Lay, MD      PHYSICAL EXAMINATION:   VITAL SIGNS: Blood pressure 132/70, pulse 83, temperature 97.6 F (36.4 C), temperature source Oral, resp. rate 16, height 6' (1.829 m), weight 107.502 kg (237 lb), SpO2 99 %.  GENERAL:  74 y.o.-year-old patient lying in the bed with no acute distress.  EYES: Pupils equal, round, reactive to light and accommodation. No scleral icterus. Extraocular muscles intact.  HEENT: Head atraumatic, normocephalic. Oropharynx and nasopharynx clear.  NECK:  Supple, no jugular venous distention. No thyroid enlargement, no tenderness.  LUNGS: Normal breath sounds bilaterally, no wheezing, rales,rhonchi or crepitation. No use of accessory muscles of respiration.  CARDIOVASCULAR: S1, S2 normal. No murmurs, rubs, or gallops.  ABDOMEN: Soft,post surgery dressing present, nondistended. Bowel sounds present. EXTREMITIES: No pedal edema, cyanosis, or clubbing.  NEUROLOGIC: Cranial nerves II through XII are intact. Muscle strength 5/5 in all extremities. Sensation intact. Gait not checked.  PSYCHIATRIC: The patient is alert and oriented x 3.  SKIN: No obvious  rash, lesion, or ulcer.   LABORATORY PANEL:   CBC  Recent Labs Lab 10/26/14 1517  WBC 16.1*  HGB 13.3  HCT 40.8  PLT 162  MCV 93.1  MCH 30.4  MCHC 32.6  RDW 13.2   ------------------------------------------------------------------------------------------------------------------  Chemistries   Recent Labs Lab 10/26/14 1517  CREATININE 1.77*   ------------------------------------------------------------------------------------------------------------------ estimated creatinine clearance is 45.7 mL/min (by C-G formula based on Cr of 1.77). ------------------------------------------------------------------------------------------------------------------ No results for input(s): TSH, T4TOTAL, T3FREE, THYROIDAB in the last 72 hours.  Invalid input(s): FREET3   Coagulation profile No results for input(s): INR, PROTIME in the last 168 hours. ------------------------------------------------------------------------------------------------------------------- No results for input(s): DDIMER in the last 72 hours. -------------------------------------------------------------------------------------------------------------------  Cardiac Enzymes No results for input(s): CKMB, TROPONINI, MYOGLOBIN in the last 168 hours.  Invalid input(s): CK ------------------------------------------------------------------------------------------------------------------ Invalid input(s): POCBNP  ---------------------------------------------------------------------------------------------------------------  Urinalysis    Component Value Date/Time   COLORURINE Yellow 06/22/2014 2230   APPEARANCEUR Clear 06/22/2014 2230   LABSPEC 1.028 06/22/2014 2230   PHURINE 5.0 06/22/2014 2230   GLUCOSEU Negative 06/22/2014 2230   HGBUR  Negative 06/22/2014 2230   BILIRUBINUR Negative 06/22/2014 2230   KETONESUR Trace 06/22/2014 2230   PROTEINUR Negative 06/22/2014 2230   NITRITE Negative 06/22/2014  2230   LEUKOCYTESUR Negative 06/22/2014 2230     RADIOLOGY: No results found.   IMPRESSION AND PLAN:  * Diabetes  The patient takes metformin at home  As currently he is only on ice chips, I will not give any oral antidiabetic medication.  I'll keep him on insulin sliding scale coverage with fingerstick checking.  * Hypertension  Continue lisinopril  * Status post bowel reanastomosis surgery  Manage per primary team.  Continue morphine for pain management and Zofran for nausea.  * Insomnia  He uses melatonin or Ambien at night I will give him Ambien as needed basis.  * Acute worsening of renal function  His creatinine was 1.18 on April 2016, I will continue monitoring.  All the records are reviewed and case discussed with ED provider. Management plans discussed with the patient, family and they are in agreement.  CODE STATUS: full    Code Status Orders        Start     Ordered   10/26/14 1419  Full code   Continuous     10/26/14 1418       TOTAL TIME TAKING CARE OF THIS PATIENT: 50 minutes.    Altamese Dilling M.D on 10/26/2014   Between 7am to 6pm - Pager - [phone]  After 6pm go to [email or web address] - password [email or web address] [facility]  [place] [facility]  Office  [phone]  CC: Primary care physician; Clydie Braun, MD

## 2014-10-26 NOTE — Transfer of Care (Signed)
Immediate Anesthesia Transfer of Care Note  Patient: Jack White  Procedure(s) Performed: Procedure(s): COLOSTOMY TAKEDOWN (N/A)  Patient Location: PACU  Anesthesia Type:General  Level of Consciousness: awake and patient cooperative  Airway & Oxygen Therapy: Patient Spontanous Breathing  Post-op Assessment: Report given to RN and Post -op Vital signs reviewed and stable  Post vital signs: Reviewed and stable  Last Vitals:  Filed Vitals:   10/26/14 1301  BP: 138/77  Pulse: 94  Temp: 37.2 C  Resp: 16    Complications: No apparent anesthesia complications

## 2014-10-26 NOTE — OR Nursing (Signed)
Pt. Asymptomatic and ekg shows heart rate of 113

## 2014-10-26 NOTE — Progress Notes (Signed)
He is doing well postoperatively. Pain control appears to be the major issue. Vital signs are stable. He has not voided fascia. I did speak with the family at length.

## 2014-10-26 NOTE — Progress Notes (Signed)
Preoperative Review   Patient is met in the preoperative holding area. The history is reviewed in the chart and with the patient. I personally reviewed the options and rationale as well as the risks of this procedure that have been previously discussed with the patient. All questions asked by the patient and/or family were answered to their satisfaction.  Patient agrees to proceed with this procedure at this time.  Jack Crawford MD FACS

## 2014-10-26 NOTE — Op Note (Signed)
10/26/2014  12:59 PM  PATIENT:  Jack White  74 y.o. male  PRE-OPERATIVE DIAGNOSIS:  perforation of colon  POST-OPERATIVE DIAGNOSIS:  perforation of colon  PROCEDURE:  Procedure(s): COLOSTOMY TAKEDOWN (N/A)  SURGEON:  Surgeon(s) and Role:    * Salley Hews, MD - Primary    * Hulda Marin, MD - Assisting   ASSISTANTS: As above   ANESTHESIA:   general  EBL:  Total I/O In: 1200 [I.V.:1200] Out: 200 [Blood:200]   DRAINS: Penrose drain in the Wounds   LOCAL MEDICATIONS USED:  NONE   DISPOSITION OF SPECIMEN:  N/A   DICTATION: .Dragon Dictation with the patient supine position and after induction of appropriate general anesthesia the patient's abdomen was prepped ChloraPrep and draped sterile towels. Alcohol wipe and Betadine impregnated Steri-Drape were utilized. The previous midline incision was opened and carried down through the subcutaneous tissue. The midline fascia identified and opened the length skin incision. It was quite difficult given that the abdominal cavity is several multiple adhesions in the abdomen. Tedious dissection was required to expose free abdominal space. Transverse colon mucous fistula and the descending colon were identified of the active ostomy site. The active ostomy site of an oversewn with 3-0 nylon prior to opening the abdomen.  Small bowel adhesions were taken down from the 2 loops of bowel. Both the ostomy and mucous fistula were then divided at the fascial level using the contour stapling device. The 2 loops valve placed side-by-side the bowel opened and an end to end anastomosis created using handsewn technique. 3-0 silk suture placed on the back wall interrupted fashion. The mucosal layer was closed in a running suture of 0 Vicryl anterior serum muscular sutures of 3-0 silk were utilized to complete the anastomosis. No attempt was made to close the mesenteric defect as it was quite large. The cut ends of the bowel both bled profusely and  appeared be good perfusion of both hands. Bowel contents current anatomic position. The abdomen was copious irrigated. The omentum was placed over the anastomosis. Midline fascia was closed with #1 Prolene looped from each end tying in the middle and burying the knot. Each of the ostomy is both mucous fistula and functioning ostomy was ellipsed. Ounces E spelled cautery separated from the anterior fascia and posterior fascial layers. There were removed without difficulty.  The area was irrigated. Fascial defects were closed with interrupted sutures of 0 Maxon. The closure was quite difficult because of the multiple defects on the same fascial plane. A Penrose drain was placed the base of the midline incision and in the bases 2 ostomy incisions. Skin was clipped sterile dressings were applied patient returned recovery room having tolerated procedure well. Sponge is for needle count were correct 2 in the operating room.  PLAN OF CARE: Admit to inpatient   PATIENT DISPOSITION:  PACU - hemodynamically stable.   Tiney Rouge III, MD

## 2014-10-27 LAB — BASIC METABOLIC PANEL
Anion gap: 8 (ref 5–15)
BUN: 16 mg/dL (ref 6–20)
CALCIUM: 8.1 mg/dL — AB (ref 8.9–10.3)
CO2: 22 mmol/L (ref 22–32)
Chloride: 106 mmol/L (ref 101–111)
Creatinine, Ser: 1.74 mg/dL — ABNORMAL HIGH (ref 0.61–1.24)
GFR calc Af Amer: 42 mL/min — ABNORMAL LOW (ref >60)
GFR, EST NON AFRICAN AMERICAN: 37 mL/min — AB (ref >60)
Glucose, Bld: 200 mg/dL — ABNORMAL HIGH (ref 65–99)
POTASSIUM: 5.7 mmol/L — AB (ref 3.5–5.1)
SODIUM: 136 mmol/L (ref 135–145)

## 2014-10-27 LAB — COMPREHENSIVE METABOLIC PANEL
ALBUMIN: 3.2 g/dL — AB (ref 3.5–5.0)
ALT: 11 U/L — AB (ref 17–63)
AST: 20 U/L (ref 15–41)
Alkaline Phosphatase: 75 U/L (ref 38–126)
Anion gap: 7 (ref 5–15)
BILIRUBIN TOTAL: 1.5 mg/dL — AB (ref 0.3–1.2)
BUN: 18 mg/dL (ref 6–20)
CO2: 24 mmol/L (ref 22–32)
CREATININE: 1.75 mg/dL — AB (ref 0.61–1.24)
Calcium: 8.2 mg/dL — ABNORMAL LOW (ref 8.9–10.3)
Chloride: 104 mmol/L (ref 101–111)
GFR calc Af Amer: 42 mL/min — ABNORMAL LOW (ref >60)
GFR, EST NON AFRICAN AMERICAN: 36 mL/min — AB (ref >60)
GLUCOSE: 205 mg/dL — AB (ref 65–99)
Potassium: 5.1 mmol/L (ref 3.5–5.1)
Sodium: 135 mmol/L (ref 135–145)
TOTAL PROTEIN: 6.8 g/dL (ref 6.5–8.1)

## 2014-10-27 LAB — CBC
HEMATOCRIT: 41.2 % (ref 40.0–52.0)
Hemoglobin: 13.4 g/dL (ref 13.0–18.0)
MCH: 30 pg (ref 26.0–34.0)
MCHC: 32.5 g/dL (ref 32.0–36.0)
MCV: 92.4 fL (ref 80.0–100.0)
PLATELETS: 163 10*3/uL (ref 150–440)
RBC: 4.46 MIL/uL (ref 4.40–5.90)
RDW: 13.5 % (ref 11.5–14.5)
WBC: 19.9 10*3/uL — AB (ref 3.8–10.6)

## 2014-10-27 LAB — GLUCOSE, CAPILLARY
GLUCOSE-CAPILLARY: 173 mg/dL — AB (ref 65–99)
GLUCOSE-CAPILLARY: 183 mg/dL — AB (ref 65–99)
GLUCOSE-CAPILLARY: 164 mg/dL — AB (ref 65–99)
Glucose-Capillary: 186 mg/dL — ABNORMAL HIGH (ref 65–99)

## 2014-10-27 MED ORDER — SODIUM CHLORIDE 0.9 % IV BOLUS (SEPSIS)
500.0000 mL | Freq: Once | INTRAVENOUS | Status: AC
  Administered 2014-10-27: 500 mL via INTRAVENOUS

## 2014-10-27 MED ORDER — SODIUM CHLORIDE 0.9 % IJ SOLN: 9.0000 mL | INTRAMUSCULAR | Status: DC | PRN

## 2014-10-27 MED ORDER — ONDANSETRON HCL 4 MG/2ML IJ SOLN
4.0000 mg | Freq: Four times a day (QID) | INTRAMUSCULAR | Status: DC | PRN
  Administered 2014-10-27 – 2014-10-28 (×4): 4 mg via INTRAVENOUS
  Filled 2014-10-27 (×5): qty 2

## 2014-10-27 MED ORDER — DIPHENHYDRAMINE HCL 12.5 MG/5ML PO ELIX: 12.5000 mg | ORAL_SOLUTION | Freq: Four times a day (QID) | ORAL | Status: DC | PRN

## 2014-10-27 MED ORDER — INSULIN ASPART 100 UNIT/ML ~~LOC~~ SOLN
0.0000 [IU] | Freq: Three times a day (TID) | SUBCUTANEOUS | Status: DC
  Administered 2014-10-27 – 2014-10-29 (×5): 3 [IU] via SUBCUTANEOUS
  Administered 2014-10-29 – 2014-10-30 (×4): 2 [IU] via SUBCUTANEOUS
  Administered 2014-10-31: 3 [IU] via SUBCUTANEOUS
  Administered 2014-10-31 – 2014-11-01 (×2): 2 [IU] via SUBCUTANEOUS
  Filled 2014-10-27: qty 2
  Filled 2014-10-27: qty 3
  Filled 2014-10-27: qty 2
  Filled 2014-10-27: qty 3
  Filled 2014-10-27 (×3): qty 2
  Filled 2014-10-27 (×3): qty 3
  Filled 2014-10-27 (×2): qty 2

## 2014-10-27 MED ORDER — INSULIN ASPART 100 UNIT/ML ~~LOC~~ SOLN: 0.0000 [IU] | Freq: Every day | SUBCUTANEOUS | Status: DC

## 2014-10-27 MED ORDER — DILTIAZEM HCL 25 MG/5ML IV SOLN
5.0000 mg | Freq: Two times a day (BID) | INTRAVENOUS | Status: DC
  Administered 2014-10-27 – 2014-10-28 (×3): 5 mg via INTRAVENOUS
  Filled 2014-10-27 (×6): qty 5

## 2014-10-27 MED ORDER — DIPHENHYDRAMINE HCL 50 MG/ML IJ SOLN: 12.5000 mg | Freq: Four times a day (QID) | INTRAMUSCULAR | Status: DC | PRN

## 2014-10-27 MED ORDER — NALOXONE HCL 0.4 MG/ML IJ SOLN
0.4000 mg | INTRAMUSCULAR | Status: DC | PRN
Start: 2014-10-27 — End: 2014-10-28

## 2014-10-27 MED ORDER — SODIUM CHLORIDE 0.45 % IV SOLN: INTRAVENOUS | Status: DC

## 2014-10-27 MED ORDER — PNEUMOCOCCAL VAC POLYVALENT 25 MCG/0.5ML IJ INJ
0.5000 mL | INJECTION | INTRAMUSCULAR | Status: AC
  Administered 2014-10-31: 0.5 mL via INTRAMUSCULAR
  Filled 2014-10-27: qty 0.5

## 2014-10-27 MED ORDER — SODIUM CHLORIDE 0.9 % IV SOLN
INTRAVENOUS | Status: DC
  Administered 2014-10-27 – 2014-10-31 (×6): via INTRAVENOUS

## 2014-10-27 MED ORDER — MORPHINE SULFATE 1 MG/ML IV SOLN
INTRAVENOUS | Status: DC
  Administered 2014-10-27: 14:00:00 via INTRAVENOUS
  Administered 2014-10-27: 8.92 mg via INTRAVENOUS
  Administered 2014-10-28: 3.98 mg via INTRAVENOUS
  Administered 2014-10-28: 3.14 mg via INTRAVENOUS
  Administered 2014-10-28: 4 mg via INTRAVENOUS
  Administered 2014-10-28: 1 mg via INTRAVENOUS
  Administered 2014-10-28: 2.02 mg via INTRAVENOUS
  Filled 2014-10-27 (×2): qty 25

## 2014-10-27 MED ORDER — METOPROLOL TARTRATE 25 MG PO TABS: 25.0000 mg | ORAL_TABLET | Freq: Two times a day (BID) | ORAL | Status: DC

## 2014-10-27 NOTE — Progress Notes (Signed)
[facility] Phys[facility] - [place] at [facility]   PATIENT NAME: Jack White    MR#:  [id number]  DATE OF BIRTH:  1941-02-01  SUBJECTIVE:  CHIEF COMPLAINT:  No chief complaint on file.  having abdominal pain, heart rate up to 130s, s REVIEW OF SYSTEMS:  Review of Systems  Constitutional: Negative for fever, weight loss, malaise/fatigue and diaphoresis.  HENT: Negative for ear discharge, ear pain, hearing loss, nosebleeds, sore throat and tinnitus.   Eyes: Negative for blurred vision and pain.  Respiratory: Negative for cough, hemoptysis, shortness of breath and wheezing.   Cardiovascular: Negative for chest pain, palpitations, orthopnea and leg swelling.  Gastrointestinal: Positive for nausea, vomiting and abdominal pain. Negative for heartburn, diarrhea, constipation and blood in stool.  Genitourinary: Negative for dysuria, urgency and frequency.  Musculoskeletal: Negative for myalgias and back pain.  Skin: Negative for itching and rash.  Neurological: Negative for dizziness, tingling, tremors, focal weakness, seizures, weakness and headaches.  Psychiatric/Behavioral: Negative for depression. The patient is not nervous/anxious.    DRUG ALLERGIES:   Allergies  Allergen Reactions  . Dilaudid [Hydromorphone Hcl] Other (See Comments)    Respiratory arrest  . Tomato Other (See Comments)    GI distress, respiratory distress   VITALS:  Blood pressure 133/76, pulse 123, temperature 97.9 F (36.6 C), temperature source Oral, resp. rate 20, height 6' (1.829 m), weight 107.502 kg (237 lb), SpO2 93 %. PHYSICAL EXAMINATION:  Physical Exam  Constitutional: He is oriented to person, place, and time and well-developed, well-nourished, and in no distress.  HENT:  Head: Normocephalic and atraumatic.  Eyes: Conjunctivae and EOM are normal. Pupils are equal, round, and reactive to light.  Neck: Normal range of motion. Neck supple. No tracheal deviation present. No thyromegaly  present.  Cardiovascular: Normal rate, regular rhythm and normal heart sounds.   Pulmonary/Chest: Effort normal and breath sounds normal. No respiratory distress. He has no wheezes. He exhibits no tenderness.  Abdominal: Soft. Bowel sounds are normal. He exhibits no distension. There is tenderness.  Musculoskeletal: Normal range of motion.  Neurological: He is alert and oriented to person, place, and time. No cranial nerve deficit.  Skin: Skin is warm and dry. No rash noted.  Psychiatric: Mood and affect normal.   LABORATORY PANEL:   CBC  Recent Labs Lab 10/27/14 0417  WBC 19.9*  HGB 13.4  HCT 41.2  PLT 163   ------------------------------------------------------------------------------------------------------------------ Chemistries   Recent Labs Lab 10/27/14 0841  NA 135  K 5.1  CL 104  CO2 24  GLUCOSE 205*  BUN 18  CREATININE 1.75*  CALCIUM 8.2*  AST 20  ALT 11*  ALKPHOS 75  BILITOT 1.5*   ASSESSMENT AND PLAN:  * Diabetes The patient takes metformin at home As currently he is only on ice chips, hold off on any oral antidiabetic medication. continue insulin sliding scale coverage with fingerstick checking. Consult diabetes nurse. May need lantus/levemir  * Hypertension/Tachycardia Change lisinopril to IV cardizem as he's not able to use PO  * Status post bowel reanastomosis surgery Manage per primary team. Continue morphine for pain management and Zofran for nausea.  * Insomnia Ambien as needed basis.  * Acute renal failure:His creatinine was 1.18 on April 2016, continue monitoring.   All the records are reviewed and case discussed with Care Management/Social Worker. Management plans discussed with the patient, family and they are in agreement.  CODE STATUS: Full Code  TOTAL TIME TAKING CARE OF THIS PATIENT: 15 minutes.  More than 50% of the time was spent in counseling/coordination of care: Scheryl Darter, Darian Cansler M.D on 10/27/2014 at 10:28  AM  Between 7am to 6pm - Pager - [phone]  After 6pm go to [email or web address] - password [email or web address] [facility]  [place] [facility]  Office  [phone]  CC:  Primary care physician; Clydie Braun, MD

## 2014-10-27 NOTE — Progress Notes (Signed)
Pt was noted to be anxious and stated "I don't do well when I am in hospitals". PRN ativan given at 2258.   Pt noted with temp of 100.0 F at 2333. PRN tylenol given as ordered. Pt states that heartburn has subsided.   Pt awoken at 0015 confused and was trying to get out of bed. Bed alarm on and assisted pt with urinal.   Will continue to monitor.

## 2014-10-27 NOTE — Anesthesia Postprocedure Evaluation (Signed)
  Anesthesia Post-op Note  Patient: Jack White  Procedure(s) Performed: Procedure(s): COLOSTOMY TAKEDOWN (N/A)  Anesthesia type:General  Patient location: PACU  Post pain: Pain level controlled  Post assessment: Post-op Vital signs reviewed, Patient's Cardiovascular Status Stable, Respiratory Function Stable, Patent Airway and No signs of Nausea or vomiting  Post vital signs: Reviewed and stable  Last Vitals:  Filed Vitals:   10/27/14 1219  BP: 134/70  Pulse: 120  Temp:   Resp:     Level of consciousness: awake, alert  and patient cooperative  Complications: No apparent anesthesia complications

## 2014-10-27 NOTE — Progress Notes (Signed)
Pt c/o "heartburn". Pulse 100. All other vitals stable. PRN zofran was given at 2053. Medication was not effective. Notified Dr. Tonita Cong. New orders for PRN maalox & PRN phenergan. Pt denies any other symptoms at this time. Will continue to monitor.

## 2014-10-27 NOTE — Progress Notes (Signed)
Patient heart rate remains elevated  from 120's to 125. Dr. Michela Pitcher aware and Dr. Sherryll Burger were aware. Patient is asymptomatic. No signs of acute distress. Continue to monitor. Patient  was also bladder scanned this afternoon and had a reading of 454. MD notified that patient refused to have foley cath in place. Encouraged patient to stand up to urinate to empty the bladder.

## 2014-10-27 NOTE — Progress Notes (Signed)
1 Day Post-Op   Subjective:  He was febrile last night 200 but was not confused. He did have some nausea but did not vomit. His heart rate is steadily gone up over the course of the day. He's been seen by internal medicine admitted given 1 dose of Cardizem. He denies any nausea to present time. He is not getting good pain control is not any pain medicine since early this morning. His moderate drainage from his JP drains. His hemoglobin staples white count up slightly. His creatinine is 1.7 compared to 1.7 preoperatively. However, he is not making much urine at this point.  Vital signs in last 24 hours: Temp:  [97.6 F (36.4 C)-100 F (37.8 C)] 97.9 F (36.6 C) (09/01 0738) Pulse Rate:  [80-136] 120 (09/01 1219) Resp:  [16-20] 20 (09/01 0738) BP: (126-145)/(62-77) 134/70 mmHg (09/01 1219) SpO2:  [92 %-99 %] 93 % (09/01 0851)    Intake/Output from previous day: 08/31 0701 - 09/01 0700 In: 3834.3 [P.O.:700; I.V.:3134.3] Out: 275 [Urine:75; Blood:200]  GI: Saphenous soft but he does have significant incisional tenderness. He does have hypoactive bowel sounds. His breath sounds are clear bilaterally with no wheezing and no adventitious sounds.  Lab Results:  CBC  Recent Labs  10/26/14 1517 10/27/14 0417  WBC 16.1* 19.9*  HGB 13.3 13.4  HCT 40.8 41.2  PLT 162 163   CMP     Component Value Date/Time   NA 135 10/27/2014 0841   NA 138 06/25/2014 0453   K 5.1 10/27/2014 0841   K 4.5 06/25/2014 0453   CL 104 10/27/2014 0841   CL 110 06/25/2014 0453   CO2 24 10/27/2014 0841   CO2 24 06/25/2014 0453   GLUCOSE 205* 10/27/2014 0841   GLUCOSE 99 06/25/2014 0453   BUN 18 10/27/2014 0841   BUN 17 06/25/2014 0453   CREATININE 1.75* 10/27/2014 0841   CREATININE 1.18 06/25/2014 0453   CALCIUM 8.2* 10/27/2014 0841   CALCIUM 7.8* 06/25/2014 0453   PROT 6.8 10/27/2014 0841   PROT 8.4* 06/22/2014 1734   ALBUMIN 3.2* 10/27/2014 0841   ALBUMIN 2.8* 06/22/2014 1734   AST 20 10/27/2014  0841   AST 28 06/22/2014 1734   ALT 11* 10/27/2014 0841   ALT 24 06/22/2014 1734   ALKPHOS 75 10/27/2014 0841   ALKPHOS 96 06/22/2014 1734   BILITOT 1.5* 10/27/2014 0841   BILITOT 0.4 06/22/2014 1734   GFRNONAA 36* 10/27/2014 0841   GFRNONAA >60 06/25/2014 0453   GFRNONAA 37* 04/23/2011 1213   GFRAA 42* 10/27/2014 0841   GFRAA >60 06/25/2014 0453   GFRAA 44* 04/23/2011 1213   PT/INR No results for input(s): LABPROT, INR in the last 72 hours.  Studies/Results: No results found.  Assessment/Plan: I'm very concerned about his tachycardia. This been difficult to assess his volume status as he is not voiding very well. Bladder scan demonstrated no urine in his bladder and I have trouble believing that is the truth. I suspect he would have some urinary many evident. He may be dribbling and have a significant bladder distention. Workup and repeat his bladder scan, place him back on a patient-controlled analgesia, and try to improve his volume status. He may need a dose of diuretics. He only lost 200 cc of blood and did not have any significant intra-abdominal fluid shifts. But he did have a regular prep preoperatively which may be the reason for his dehydration. We'll repeat his labs in the morning and see if we can improve  his overall status. This plan was discussed with the patient and his family.

## 2014-10-27 NOTE — Progress Notes (Signed)
Pt c/o feeling the urge to void but not able to void at this time. Bladder scan showed 0ml. Dr. Tonita Cong notified. New orders for foley catheter to be placed. Will continue to monitor.

## 2014-10-27 NOTE — Progress Notes (Signed)
1 Day Post-Op   Subjective:  Called overnight for multiple concerns. Primary of which is patient reported nausea. He has been getting alternating zofran and phenergan with minimal relief. The combination with his pain medication has allowed for sleep. He states his abdomen is hurting but is unable to pinpoint one place that hurts more than any other. Nursing also called with a concern that he had not voided all shift for which an order for a Foley was given. Patient then voided and was noted to have some urine in his bed as evidence of incontinence. Patient reports feeling a little better after voiding. He was very groggy during my visit and not as clear as he was at the beginning of my shift.  Vital signs in last 24 hours: Temp:  [97.6 F (36.4 C)-100 F (37.8 C)] 99 F (37.2 C) (09/01 0030) Pulse Rate:  [69-119] 116 (08/31 2333) Resp:  [0-20] 20 (08/31 2333) BP: (122-141)/(62-77) 140/72 mmHg (08/31 2333) SpO2:  [92 %-99 %] 92 % (08/31 2333) Weight:  [107.502 kg (237 lb)] 107.502 kg (237 lb) (08/31 0918)    Intake/Output from previous day: 08/31 0701 - 09/01 0700 In: 3834.3 [P.O.:700; I.V.:3134.3] Out: 275 [Urine:75; Blood:200]  GI: Dressing taken down for full evaluation due to complaints of pain and difficulty assessing where his abdomen should be tender. Incisions all well approximated with staples. No active bleeding. Penrose drains in place at most cephalad and caudad aspects of midline and at lateral aspect of prior ostomy site. Abdomen is soft and not distended by markedly tender along midline incision. No rebound or other signs of diffuse peritonitis. Also tender to palpation in superpubic region.  Lab Results:  CBC  Recent Labs  10/26/14 1517 10/27/14 0417  WBC 16.1* 19.9*  HGB 13.3 13.4  HCT 40.8 41.2  PLT 162 163   CMP     Component Value Date/Time   NA 136 10/27/2014 0417   NA 138 06/25/2014 0453   K 5.7* 10/27/2014 0417   K 4.5 06/25/2014 0453   CL 106  10/27/2014 0417   CL 110 06/25/2014 0453   CO2 22 10/27/2014 0417   CO2 24 06/25/2014 0453   GLUCOSE 200* 10/27/2014 0417   GLUCOSE 99 06/25/2014 0453   BUN 16 10/27/2014 0417   BUN 17 06/25/2014 0453   CREATININE 1.74* 10/27/2014 0417   CREATININE 1.18 06/25/2014 0453   CALCIUM 8.1* 10/27/2014 0417   CALCIUM 7.8* 06/25/2014 0453   PROT 8.4* 06/22/2014 1734   ALBUMIN 2.8* 06/22/2014 1734   AST 28 06/22/2014 1734   ALT 24 06/22/2014 1734   ALKPHOS 96 06/22/2014 1734   BILITOT 0.4 06/22/2014 1734   GFRNONAA 37* 10/27/2014 0417   GFRNONAA >60 06/25/2014 0453   GFRNONAA 37* 04/23/2011 1213   GFRAA 42* 10/27/2014 0417   GFRAA >60 06/25/2014 0453   GFRAA 44* 04/23/2011 1213   PT/INR No results for input(s): LABPROT, INR in the last 72 hours.  Studies/Results: No results found.  Assessment/Plan: 74 year old male POD#1 s/p colostomy take down.  Continues to complain of nausea and pain. This is expected to some degree given the extent of the operation he underwent yesterday. If unable to get relief with current regimen could possibly change PRN meds to a PCA and possibly change PRN antiemetics to scheduled until he has return of bowel function.  Urine output has been minimal. Difficult to assess if this is due to incontinence or if it is actually due to  urinary retention. Voided a small amount just before being seen. Discussed with patient and his nurse that if he is unable to void into a urinal more in the next 2 hours that he will need a catheter placed. Would leave the foley to monitor urine output for at least the next 24 hours.  Will discuss with the oncoming provider and will be watching closely as a team.  Ricarda Frame, MD [facility] General Surgeon [facility]

## 2014-10-27 NOTE — Progress Notes (Signed)
    Inpatient Diabetes Program Recommendations  AACE/ADA: New Consensus Statement on Inpatient Glycemic Control (2013)  Target Ranges:  Prepandial:   less than 140 mg/dL      Peak postprandial:   less than 180 mg/dL (1-2 hours)      Critically ill patients:  140 - 180 mg/dL   Reason for review: elevated CBG   Outpatient Diabetes medications: Metformin  bid Current orders for Inpatient glycemic control: Novolog 0-9 units tid  A1C 5.7% on 07/2014. Elevated blood sugars may be a result of nausea and pain.  Do not recommend Lantus at this time.  Agree with current order for Novolog insulin tid.  May require moderate correction insulin 0-15 units tid and 0-5 qhs, Will follow  Susette Racer, RN, [place], [place], CDE Diabetes Coordinator Inpatient Diabetes Program  [phone] (Team Pager) [phone] [facility] Office) 10/27/2014 10:48 AM

## 2014-10-27 NOTE — Progress Notes (Signed)
Voided 75cc just before urinary catheter was inserted as ordered. Pt's sheets changed and small amount of urine found. Pt is incontinent of urine. Adult incontinence product will now be used for protection. Dr. Tonita Cong notified. New orders to wait 2 hours and if pt does not void, move forward with inserting urinary catheter. Will pass along to oncoming nurse and will continue to monitor.

## 2014-10-28 LAB — GLUCOSE, CAPILLARY
GLUCOSE-CAPILLARY: 184 mg/dL — AB (ref 65–99)
GLUCOSE-CAPILLARY: 151 mg/dL — AB (ref 65–99)
Glucose-Capillary: 168 mg/dL — ABNORMAL HIGH (ref 65–99)

## 2014-10-28 LAB — CBC
HCT: 37.7 % — ABNORMAL LOW (ref 40.0–52.0)
Hemoglobin: 12.4 g/dL — ABNORMAL LOW (ref 13.0–18.0)
MCH: 30.5 pg (ref 26.0–34.0)
MCHC: 32.8 g/dL (ref 32.0–36.0)
MCV: 92.8 fL (ref 80.0–100.0)
PLATELETS: 154 10*3/uL (ref 150–440)
RBC: 4.06 MIL/uL — AB (ref 4.40–5.90)
RDW: 13.7 % (ref 11.5–14.5)
WBC: 20 10*3/uL — AB (ref 3.8–10.6)

## 2014-10-28 LAB — BASIC METABOLIC PANEL
ANION GAP: 8 (ref 5–15)
BUN: 29 mg/dL — AB (ref 6–20)
CALCIUM: 8.1 mg/dL — AB (ref 8.9–10.3)
CO2: 22 mmol/L (ref 22–32)
Chloride: 106 mmol/L (ref 101–111)
Creatinine, Ser: 1.67 mg/dL — ABNORMAL HIGH (ref 0.61–1.24)
GFR calc Af Amer: 45 mL/min — ABNORMAL LOW (ref >60)
GFR, EST NON AFRICAN AMERICAN: 38 mL/min — AB (ref >60)
Glucose, Bld: 197 mg/dL — ABNORMAL HIGH (ref 65–99)
POTASSIUM: 4.7 mmol/L (ref 3.5–5.1)
SODIUM: 136 mmol/L (ref 135–145)

## 2014-10-28 MED ORDER — METOPROLOL TARTRATE 25 MG PO TABS
25.0000 mg | ORAL_TABLET | Freq: Two times a day (BID) | ORAL | Status: DC
Start: 2014-10-28 — End: 2014-10-29
  Administered 2014-10-28 – 2014-10-29 (×2): 25 mg via ORAL
  Filled 2014-10-28 (×2): qty 1

## 2014-10-28 MED ORDER — SODIUM CHLORIDE 0.9 % IJ SOLN: 9.0000 mL | INTRAMUSCULAR | Status: DC | PRN

## 2014-10-28 MED ORDER — ONDANSETRON HCL 4 MG/2ML IJ SOLN
4.0000 mg | Freq: Four times a day (QID) | INTRAMUSCULAR | Status: DC | PRN
  Administered 2014-10-29 – 2014-10-31 (×3): 4 mg via INTRAVENOUS
  Filled 2014-10-28 (×3): qty 2

## 2014-10-28 MED ORDER — MORPHINE SULFATE 1 MG/ML IV SOLN
INTRAVENOUS | Status: DC
  Administered 2014-10-28: 4.73 mg via INTRAVENOUS
  Administered 2014-10-29: 3.93 mg via INTRAVENOUS
  Administered 2014-10-29: 10.55 mg via INTRAVENOUS
  Administered 2014-10-29: 0.08 mg via INTRAVENOUS

## 2014-10-28 MED ORDER — NALOXONE HCL 0.4 MG/ML IJ SOLN: 0.4000 mg | INTRAMUSCULAR | Status: DC | PRN

## 2014-10-28 MED ORDER — DILTIAZEM HCL 25 MG/5ML IV SOLN
5.0000 mg | Freq: Four times a day (QID) | INTRAVENOUS | Status: DC
  Administered 2014-10-28: 5 mg via INTRAVENOUS
  Filled 2014-10-28 (×8): qty 5

## 2014-10-28 MED ORDER — DILTIAZEM HCL 25 MG/5ML IV SOLN: 5.0000 mg | Freq: Four times a day (QID) | INTRAVENOUS | Status: DC | PRN

## 2014-10-28 MED ORDER — HYDROXYZINE HCL 10 MG/5ML PO SYRP
10.0000 mg | ORAL_SOLUTION | Freq: Four times a day (QID) | ORAL | Status: DC | PRN
  Filled 2014-10-28: qty 5

## 2014-10-28 NOTE — Progress Notes (Signed)
Patient's bladder was scanned for urinary retention and had a reading of 254 just before lunch.  Offered to have foley in place but patient refused.

## 2014-10-28 NOTE — Progress Notes (Signed)
[facility] Physicians - [place] at [facility]   PATIENT NAME: Jack White    MR#:  [id number]  DATE OF BIRTH:  1940/03/21  SUBJECTIVE:  CHIEF COMPLAINT:  No chief complaint on file.  looking somewhat better. heart rate 120-130s, refusing Foley catheter REVIEW OF SYSTEMS:  Review of Systems  Constitutional: Negative for fever, weight loss, malaise/fatigue and diaphoresis.  HENT: Negative for ear discharge, ear pain, hearing loss, nosebleeds, sore throat and tinnitus.   Eyes: Negative for blurred vision and pain.  Respiratory: Negative for cough, hemoptysis, shortness of breath and wheezing.   Cardiovascular: Negative for chest pain, palpitations, orthopnea and leg swelling.  Gastrointestinal: Positive for abdominal pain. Negative for heartburn, nausea, vomiting, diarrhea, constipation and blood in stool.  Genitourinary: Negative for dysuria, urgency and frequency.  Musculoskeletal: Negative for myalgias and back pain.  Skin: Negative for itching and rash.  Neurological: Negative for dizziness, tingling, tremors, focal weakness, seizures, weakness and headaches.  Psychiatric/Behavioral: Negative for depression. The patient is not nervous/anxious.    DRUG ALLERGIES:   Allergies  Allergen Reactions  . Dilaudid [Hydromorphone Hcl] Other (See Comments)    Respiratory arrest  . Tomato Other (See Comments)    GI distress, respiratory distress   VITALS:  Blood pressure 135/79, pulse 124, temperature 98.1 F (36.7 C), temperature source Oral, resp. rate 18, height 6' (1.829 m), weight 107.502 kg (237 lb), SpO2 95 %. PHYSICAL EXAMINATION:  Physical Exam  Constitutional: He is oriented to person, place, and time and well-developed, well-nourished, and in no distress.  HENT:  Head: Normocephalic and atraumatic.  Eyes: Conjunctivae and EOM are normal. Pupils are equal, round, and reactive to light.  Neck: Normal range of motion. Neck supple. No tracheal deviation  present. No thyromegaly present.  Cardiovascular: Normal rate, regular rhythm and normal heart sounds.   Pulmonary/Chest: Effort normal and breath sounds normal. No respiratory distress. He has no wheezes. He exhibits no tenderness.  Abdominal: Soft. Bowel sounds are normal. He exhibits no distension. There is tenderness.  Musculoskeletal: Normal range of motion.  Neurological: He is alert and oriented to person, place, and time. No cranial nerve deficit.  Skin: Skin is warm and dry. No rash noted.  Psychiatric: Mood and affect normal.   LABORATORY PANEL:   CBC  Recent Labs Lab 10/28/14 0444  WBC 20.0*  HGB 12.4*  HCT 37.7*  PLT 154   ------------------------------------------------------------------------------------------------------------------ Chemistries   Recent Labs Lab 10/27/14 0841 10/28/14 0444  NA 135 136  K 5.1 4.7  CL 104 106  CO2 24 22  GLUCOSE 205* 197*  BUN 18 29*  CREATININE 1.75* 1.67*  CALCIUM 8.2* 8.1*  AST 20  --   ALT 11*  --   ALKPHOS 75  --   BILITOT 1.5*  --    ASSESSMENT AND PLAN:  * Diabetes The patient takes metformin at home As currently he is only on ice chips, hold off on any oral antidiabetic medication.  Until you have moderate correction insulin regimen continue insulin sliding scale coverage with fingerstick checking.  Appreciate diabetes nurse input   * Hypertension/Tachycardia Increase dose of IV cardizem and can consider switching to by mouth when he is able  * Status post bowel reanastomosis surgery Manage per primary team. Continue morphine for pain management and Zofran for nausea.  * Insomnia: Ambien as needed basis.  * Acute renal failure:His creatinine was 1.18 on April 2016, continue monitoring.   All the records are reviewed and case discussed  with Care Management/Social Worker. Management plans discussed with the patient, family and they are in agreement.  CODE STATUS: Full Code  TOTAL TIME TAKING  CARE OF THIS PATIENT: 15 minutes.   More than 50% of the time was spent in counseling/coordination of care: Scheryl Darter, Manvir Thorson M.D on 10/28/2014 at 1:42 PM  Between 7am to 6pm - Pager - [phone]  After 6pm go to [email or web address] - password [email or web address] [facility]  [place] [facility]  Office  [phone]  CC:  Primary care physician; Clydie Braun, MD

## 2014-10-28 NOTE — Progress Notes (Signed)
Spoke with Dr Desiree Hane regarding patient's HR and informed of policy regarding cardizem push on floor.  States to change cardizem to PRN and place patient on an oral metoprolol to try to control heart rate that way.    Cristela Felt, RN

## 2014-10-28 NOTE — Progress Notes (Signed)
2 Days Post-Op   Subjective:  He looks comfortable with no significant pain now that he is on a PCA with a low dose basal rate. He is not nauseated but does have significant pickups. He has not vomited. He has not passed any gas as yet. He remains afebrile. However, his heart rate is persistently elevated to 120s.  Vital signs in last 24 hours: Temp:  [97.7 F (36.5 C)-98.6 F (37 C)] 98.6 F (37 C) (09/02 1625) Pulse Rate:  [118-128] 124 (09/02 1639) Resp:  [17-25] 18 (09/02 1625) BP: (133-176)/(67-86) 137/77 mmHg (09/02 1639) SpO2:  [91 %-95 %] 93 % (09/02 1625)    Intake/Output from previous day: 09/01 0701 - 09/02 0700 In: 3123.3 [P.O.:237; I.V.:2386.3; IV Piggyback:500] Out: 850 [Urine:850]  GI: Abdomen soft with reasonable bowel sounds no significant tenderness other than incisional tenderness. The dressing was changed and the wounds look good with no sign of any infection. There is significant line on the drainage dressings.  Lab Results:  CBC  Recent Labs  10/27/14 0417 10/28/14 0444  WBC 19.9* 20.0*  HGB 13.4 12.4*  HCT 41.2 37.7*  PLT 163 154   CMP     Component Value Date/Time   NA 136 10/28/2014 0444   NA 138 06/25/2014 0453   K 4.7 10/28/2014 0444   K 4.5 06/25/2014 0453   CL 106 10/28/2014 0444   CL 110 06/25/2014 0453   CO2 22 10/28/2014 0444   CO2 24 06/25/2014 0453   GLUCOSE 197* 10/28/2014 0444   GLUCOSE 99 06/25/2014 0453   BUN 29* 10/28/2014 0444   BUN 17 06/25/2014 0453   CREATININE 1.67* 10/28/2014 0444   CREATININE 1.18 06/25/2014 0453   CALCIUM 8.1* 10/28/2014 0444   CALCIUM 7.8* 06/25/2014 0453   PROT 6.8 10/27/2014 0841   PROT 8.4* 06/22/2014 1734   ALBUMIN 3.2* 10/27/2014 0841   ALBUMIN 2.8* 06/22/2014 1734   AST 20 10/27/2014 0841   AST 28 06/22/2014 1734   ALT 11* 10/27/2014 0841   ALT 24 06/22/2014 1734   ALKPHOS 75 10/27/2014 0841   ALKPHOS 96 06/22/2014 1734   BILITOT 1.5* 10/27/2014 0841   BILITOT 0.4 06/22/2014 1734   GFRNONAA 38* 10/28/2014 0444   GFRNONAA >60 06/25/2014 0453   GFRNONAA 37* 04/23/2011 1213   GFRAA 45* 10/28/2014 0444   GFRAA >60 06/25/2014 0453   GFRAA 44* 04/23/2011 1213   PT/INR No results for input(s): LABPROT, INR in the last 72 hours.  Studies/Results: No results found.  Assessment/Plan: He is doing well overall. I am concerned about his white blood cell count but I don't have a source for any infection. His pain control is much better. He remains afebrile. We will caution him against any by mouth intake until we have him passing some gas. I'll recheck his blood work. His creatinine is better at 1.67. I discussed this plan with him in detail and he is in agreement.

## 2014-10-28 NOTE — Care Management Important Message (Signed)
Important Message  Patient Details  Name: Jack White MRN: [id number] Date of Birth: [birth year]   Medicare Important Message Given:  Yes-second notification given    [name] 10/28/2014, 10:27 AM

## 2014-10-29 LAB — CBC
HCT: 33 % — ABNORMAL LOW (ref 40.0–52.0)
Hemoglobin: 10.9 g/dL — ABNORMAL LOW (ref 13.0–18.0)
MCH: 30.9 pg (ref 26.0–34.0)
MCHC: 32.9 g/dL (ref 32.0–36.0)
MCV: 93.9 fL (ref 80.0–100.0)
Platelets: 144 10*3/uL — ABNORMAL LOW (ref 150–440)
RBC: 3.52 MIL/uL — ABNORMAL LOW (ref 4.40–5.90)
RDW: 14.3 % (ref 11.5–14.5)
WBC: 13.7 10*3/uL — AB (ref 3.8–10.6)

## 2014-10-29 LAB — BASIC METABOLIC PANEL
Anion gap: 3 — ABNORMAL LOW (ref 5–15)
BUN: 40 mg/dL — AB (ref 6–20)
CHLORIDE: 112 mmol/L — AB (ref 101–111)
CO2: 27 mmol/L (ref 22–32)
CREATININE: 1.48 mg/dL — AB (ref 0.61–1.24)
Calcium: 8.1 mg/dL — ABNORMAL LOW (ref 8.9–10.3)
GFR calc Af Amer: 52 mL/min — ABNORMAL LOW (ref >60)
GFR calc non Af Amer: 45 mL/min — ABNORMAL LOW (ref >60)
GLUCOSE: 175 mg/dL — AB (ref 65–99)
POTASSIUM: 5.1 mmol/L (ref 3.5–5.1)
SODIUM: 142 mmol/L (ref 135–145)

## 2014-10-29 LAB — GLUCOSE, CAPILLARY
GLUCOSE-CAPILLARY: 152 mg/dL — AB (ref 65–99)
GLUCOSE-CAPILLARY: 138 mg/dL — AB (ref 65–99)
Glucose-Capillary: 154 mg/dL — ABNORMAL HIGH (ref 65–99)
Glucose-Capillary: 144 mg/dL — ABNORMAL HIGH (ref 65–99)
Glucose-Capillary: 129 mg/dL — ABNORMAL HIGH (ref 65–99)

## 2014-10-29 MED ORDER — METOPROLOL TARTRATE 25 MG PO TABS
25.0000 mg | ORAL_TABLET | Freq: Four times a day (QID) | ORAL | Status: DC
  Administered 2014-10-29 – 2014-11-01 (×12): 25 mg via ORAL
  Filled 2014-10-29 (×12): qty 1

## 2014-10-29 MED ORDER — MORPHINE SULFATE 1 MG/ML IV SOLN
INTRAVENOUS | Status: DC
  Administered 2014-10-30: 1 mg via INTRAVENOUS
  Administered 2014-10-30: 0.113 mg via INTRAVENOUS
  Administered 2014-10-30: 1 mg via INTRAVENOUS

## 2014-10-29 NOTE — Progress Notes (Signed)
3 Days Post-Op   Subjective:  He is markedly confused this evening although responsive. I suspect his confusion is related to his continuous morphine drip. He denies any pain. He is not febrile. He has not been nauseated. His heart rate is coming down now in the 90s. His volume status appears to be improved. He does not have any bowel function as yet.  Vital signs in last 24 hours: Temp:  [97.8 F (36.6 C)-98.7 F (37.1 C)] 97.9 F (36.6 C) (09/03 1140) Pulse Rate:  [81-117] 81 (09/03 1140) Resp:  [15-20] 16 (09/03 1600) BP: (129-158)/(67-94) 134/67 mmHg (09/03 1140) SpO2:  [93 %-98 %] 98 % (09/03 1600) FiO2 (%):  [100 %] 100 % (09/03 1600)    Intake/Output from previous day: 09/02 0701 - 09/03 0700 In: 3519.8 [P.O.:360; I.V.:3159.8] Out: 1075 [Urine:1075]  GI: Abdomen is soft with minimal drainage no evidence of any infection. He has hypoactive but present bowel sounds.  Lab Results:  CBC  Recent Labs  10/28/14 0444 10/29/14 0445  WBC 20.0* 13.7*  HGB 12.4* 10.9*  HCT 37.7* 33.0*  PLT 154 144*   CMP     Component Value Date/Time   NA 142 10/29/2014 0445   NA 138 06/25/2014 0453   K 5.1 10/29/2014 0445   K 4.5 06/25/2014 0453   CL 112* 10/29/2014 0445   CL 110 06/25/2014 0453   CO2 27 10/29/2014 0445   CO2 24 06/25/2014 0453   GLUCOSE 175* 10/29/2014 0445   GLUCOSE 99 06/25/2014 0453   BUN 40* 10/29/2014 0445   BUN 17 06/25/2014 0453   CREATININE 1.48* 10/29/2014 0445   CREATININE 1.18 06/25/2014 0453   CALCIUM 8.1* 10/29/2014 0445   CALCIUM 7.8* 06/25/2014 0453   PROT 6.8 10/27/2014 0841   PROT 8.4* 06/22/2014 1734   ALBUMIN 3.2* 10/27/2014 0841   ALBUMIN 2.8* 06/22/2014 1734   AST 20 10/27/2014 0841   AST 28 06/22/2014 1734   ALT 11* 10/27/2014 0841   ALT 24 06/22/2014 1734   ALKPHOS 75 10/27/2014 0841   ALKPHOS 96 06/22/2014 1734   BILITOT 1.5* 10/27/2014 0841   BILITOT 0.4 06/22/2014 1734   GFRNONAA 45* 10/29/2014 0445   GFRNONAA >60 06/25/2014  0453   GFRNONAA 37* 04/23/2011 1213   GFRAA 52* 10/29/2014 0445   GFRAA >60 06/25/2014 0453   GFRAA 44* 04/23/2011 1213   PT/INR No results for input(s): LABPROT, INR in the last 72 hours.  Studies/Results: No results found.  Assessment/Plan: His infection count continues to come down to come down. His heart rate is improved which are reflex better pain control. I think his confusion is due to his continuous drip of morphine to we will change that to when necessary meds. We'll increase his activity but will not advance his diet until he has some bowel function. He is in agreement. I spoke with this plan to the nursing staff.

## 2014-10-29 NOTE — Progress Notes (Signed)
[facility] - [place] at [facility]   PATIENT NAME: Jack White    MR#:  [id number]  DATE OF BIRTH:  Feb 11, 1941  SUBJECTIVE:  CHIEF COMPLAINT:  No chief complaint on file.  continues to improve. heart rate 100-110s, leukocytosis is improving, mentioned passing some gas REVIEW OF SYSTEMS:  Review of Systems  Constitutional: Negative for fever, weight loss, malaise/fatigue and diaphoresis.  HENT: Negative for ear discharge, ear pain, hearing loss, nosebleeds, sore throat and tinnitus.   Eyes: Negative for blurred vision and pain.  Respiratory: Negative for cough, hemoptysis, shortness of breath and wheezing.   Cardiovascular: Negative for chest pain, palpitations, orthopnea and leg swelling.  Gastrointestinal: Positive for abdominal pain. Negative for heartburn, nausea, vomiting, diarrhea, constipation and blood in stool.  Genitourinary: Negative for dysuria, urgency and frequency.  Musculoskeletal: Negative for myalgias and back pain.  Skin: Negative for itching and rash.  Neurological: Negative for dizziness, tingling, tremors, focal weakness, seizures, weakness and headaches.  Psychiatric/Behavioral: Negative for depression. The patient is not nervous/anxious.    DRUG ALLERGIES:   Allergies  Allergen Reactions  . Dilaudid [Hydromorphone Hcl] Other (See Comments)    Respiratory arrest  . Tomato Other (See Comments)    GI distress, respiratory distress   VITALS:  Blood pressure 158/86, pulse 108, temperature 98.1 F (36.7 C), temperature source Oral, resp. rate 15, height 6' (1.829 m), weight 107.502 kg (237 lb), SpO2 96 %. PHYSICAL EXAMINATION:  Physical Exam  Constitutional: He is oriented to person, place, and time and well-developed, well-nourished, and in no distress.  HENT:  Head: Normocephalic and atraumatic.  Eyes: Conjunctivae and EOM are normal. Pupils are equal, round, and reactive to light.  Neck: Normal range of motion. Neck supple. No  tracheal deviation present. No thyromegaly present.  Cardiovascular: Normal rate, regular rhythm and normal heart sounds.   Pulmonary/Chest: Effort normal and breath sounds normal. No respiratory distress. He has no wheezes. He exhibits no tenderness.  Abdominal: Soft. Bowel sounds are normal. He exhibits no distension. There is tenderness.  Musculoskeletal: Normal range of motion.  Neurological: He is alert and oriented to person, place, and time. No cranial nerve deficit.  Skin: Skin is warm and dry. No rash noted.  Psychiatric: Mood and affect normal.   LABORATORY PANEL:   CBC  Recent Labs Lab 10/29/14 0445  WBC 13.7*  HGB 10.9*  HCT 33.0*  PLT 144*   ------------------------------------------------------------------------------------------------------------------ Chemistries   Recent Labs Lab 10/27/14 0841  10/29/14 0445  NA 135  < > 142  K 5.1  < > 5.1  CL 104  < > 112*  CO2 24  < > 27  GLUCOSE 205*  < > 175*  BUN 18  < > 40*  CREATININE 1.75*  < > 1.48*  CALCIUM 8.2*  < > 8.1*  AST 20  --   --   ALT 11*  --   --   ALKPHOS 75  --   --   BILITOT 1.5*  --   --   < > = values in this interval not displayed. ASSESSMENT AND PLAN:  * Diabetes Sugars running anywhere from 150s to 180s Continue moderate correction insulin regimen continue insulin sliding scale coverage with fingerstick checking.  Appreciate diabetes nurse input   * Hypertension/Tachycardia Increase the dose of oral metoprolol and keep Cardizem when necessary. He was transferred to telemetry yesterday as could not push IV Cardizem on off unit tele  * Status post bowel reanastomosis surgery  Manage per primary team. Continue morphine for pain management and Zofran for nausea.  * Insomnia: Ambien as needed basis.  * Acute renal failure:His creatinine was 1.18 on April 2016, continue monitoring. It's 1.48 today   All the records are reviewed and case discussed with Care Management/Social  Worker. Management plans discussed with the patient, family, Dr. Juliann Pulse and they are in agreement.  CODE STATUS: Full Code  TOTAL TIME TAKING CARE OF THIS PATIENT: 15 minutes.   More than 50% of the time was spent in counseling/coordination of care: Scheryl Darter, Avilyn Virtue M.D on 10/29/2014 at 11:34 AM  Between 7am to 6pm - Pager - [phone]  After 6pm go to [email or web address] - password [email or web address] [facility]  [place] [facility]  Office  [phone]  CC:  Primary care physician; Clydie Braun, MD

## 2014-10-30 LAB — GLUCOSE, CAPILLARY
GLUCOSE-CAPILLARY: 119 mg/dL — AB (ref 65–99)
GLUCOSE-CAPILLARY: 136 mg/dL — AB (ref 65–99)
Glucose-Capillary: 130 mg/dL — ABNORMAL HIGH (ref 65–99)
Glucose-Capillary: 120 mg/dL — ABNORMAL HIGH (ref 65–99)

## 2014-10-30 LAB — CBC
HCT: 30.2 % — ABNORMAL LOW (ref 40.0–52.0)
Hemoglobin: 10.1 g/dL — ABNORMAL LOW (ref 13.0–18.0)
MCH: 31.3 pg (ref 26.0–34.0)
MCHC: 33.6 g/dL (ref 32.0–36.0)
MCV: 93.2 fL (ref 80.0–100.0)
PLATELETS: 149 10*3/uL — AB (ref 150–440)
RBC: 3.24 MIL/uL — AB (ref 4.40–5.90)
RDW: 13.9 % (ref 11.5–14.5)
WBC: 10.6 10*3/uL (ref 3.8–10.6)

## 2014-10-30 LAB — BASIC METABOLIC PANEL
ANION GAP: 5 (ref 5–15)
BUN: 37 mg/dL — AB (ref 6–20)
CALCIUM: 8.3 mg/dL — AB (ref 8.9–10.3)
CO2: 26 mmol/L (ref 22–32)
Chloride: 112 mmol/L — ABNORMAL HIGH (ref 101–111)
Creatinine, Ser: 1.38 mg/dL — ABNORMAL HIGH (ref 0.61–1.24)
GFR calc Af Amer: 56 mL/min — ABNORMAL LOW (ref >60)
GFR, EST NON AFRICAN AMERICAN: 48 mL/min — AB (ref >60)
Glucose, Bld: 140 mg/dL — ABNORMAL HIGH (ref 65–99)
POTASSIUM: 4.2 mmol/L (ref 3.5–5.1)
SODIUM: 143 mmol/L (ref 135–145)

## 2014-10-30 MED ORDER — KETOROLAC TROMETHAMINE 15 MG/ML IJ SOLN
15.0000 mg | Freq: Three times a day (TID) | INTRAMUSCULAR | Status: DC
  Administered 2014-10-30 – 2014-11-01 (×5): 15 mg via INTRAVENOUS
  Filled 2014-10-30 (×5): qty 1

## 2014-10-30 MED ORDER — MORPHINE SULFATE (PF) 2 MG/ML IV SOLN
2.0000 mg | INTRAVENOUS | Status: DC | PRN
  Administered 2014-10-31: 2 mg via INTRAVENOUS
  Filled 2014-10-30: qty 1

## 2014-10-30 NOTE — Plan of Care (Signed)
Problem: Phase I Progression Outcomes Goal: Pain controlled with appropriate interventions Outcome: Progressing PCA     

## 2014-10-30 NOTE — Progress Notes (Signed)
States that his "indigestion" is better.  Still has mild hiccups.  Denies need.

## 2014-10-30 NOTE — Progress Notes (Signed)
PCA pump discontinued. 3 mg of Morphine wasted. witnessed by Mindi Junker, RN from [facility].

## 2014-10-30 NOTE — Progress Notes (Signed)
Zofran 4mg iv given for complaints of nausea, will monitor. 

## 2014-10-30 NOTE — Progress Notes (Signed)
Maalox 30ml given for pt complaints of indigestion.  Will monitor.

## 2014-10-30 NOTE — Progress Notes (Signed)
4 Days Post-Op   Subjective:  He is still confused but not as much as yesterday. He is cooperative and responsive. He remains afebrile. Heart rate is still in the 70s. He is passing gas but has not had a bowel movement as yet. He is mildly nauseated and had some pickups but has not vomited. He is voiding spontaneously. His labs remained stable.  Vital signs in last 24 hours: Temp:  [98.1 F (36.7 C)-98.6 F (37 C)] 98.1 F (36.7 C) (09/04 1137) Pulse Rate:  [72-96] 76 (09/04 1137) Resp:  [14-20] 16 (09/04 1605) BP: (124-152)/(64-78) 135/64 mmHg (09/04 1137) SpO2:  [91 %-97 %] 96 % (09/04 1605)    Intake/Output from previous day: 09/03 0701 - 09/04 0700 In: 861.3 [P.O.:120; I.V.:741.3] Out: 1000 [Urine:1000]  GI: His wound looks good with no sign of any erythema. Continues to drain some bloody material. I will leave the Penrose drains in place. He has active bowel sounds.  Lab Results:  CBC  Recent Labs  10/29/14 0445 10/30/14 0404  WBC 13.7* 10.6  HGB 10.9* 10.1*  HCT 33.0* 30.2*  PLT 144* 149*   CMP     Component Value Date/Time   NA 143 10/30/2014 0404   NA 138 06/25/2014 0453   K 4.2 10/30/2014 0404   K 4.5 06/25/2014 0453   CL 112* 10/30/2014 0404   CL 110 06/25/2014 0453   CO2 26 10/30/2014 0404   CO2 24 06/25/2014 0453   GLUCOSE 140* 10/30/2014 0404   GLUCOSE 99 06/25/2014 0453   BUN 37* 10/30/2014 0404   BUN 17 06/25/2014 0453   CREATININE 1.38* 10/30/2014 0404   CREATININE 1.18 06/25/2014 0453   CALCIUM 8.3* 10/30/2014 0404   CALCIUM 7.8* 06/25/2014 0453   PROT 6.8 10/27/2014 0841   PROT 8.4* 06/22/2014 1734   ALBUMIN 3.2* 10/27/2014 0841   ALBUMIN 2.8* 06/22/2014 1734   AST 20 10/27/2014 0841   AST 28 06/22/2014 1734   ALT 11* 10/27/2014 0841   ALT 24 06/22/2014 1734   ALKPHOS 75 10/27/2014 0841   ALKPHOS 96 06/22/2014 1734   BILITOT 1.5* 10/27/2014 0841   BILITOT 0.4 06/22/2014 1734   GFRNONAA 48* 10/30/2014 0404   GFRNONAA >60 06/25/2014  0453   GFRNONAA 37* 04/23/2011 1213   GFRAA 56* 10/30/2014 0404   GFRAA >60 06/25/2014 0453   GFRAA 44* 04/23/2011 1213   PT/INR No results for input(s): LABPROT, INR in the last 72 hours.  Studies/Results: No results found.  Assessment/Plan: I like to increase his activity level. We'll discontinue his PCA morphine and put him on some Toradol when necessary meds. I'll recheck his laboratory values tomorrow. Overall he appears to be improving. Once he has bowel function we can advance his diet we can consider discharge. I discussed this plan with him in detail.

## 2014-10-30 NOTE — Progress Notes (Signed)
PCA pump cleared by off going RN, has pushed his PCA button 1 time since 0700 this am.

## 2014-10-30 NOTE — Progress Notes (Signed)
Pt states nausea better

## 2014-10-30 NOTE — Progress Notes (Signed)
[facility] - [place] at [facility]   PATIENT NAME: Jack White    MR#:  [id number]  DATE OF BIRTH:  12-24-1940  SUBJECTIVE:  CHIEF COMPLAINT:  No chief complaint on file.  seems somewhat loopy and confused, heart rate 70-90s, overall feeling better REVIEW OF SYSTEMS:  Review of Systems  Constitutional: Negative for fever, weight loss, malaise/fatigue and diaphoresis.  HENT: Negative for ear discharge, ear pain, hearing loss, nosebleeds, sore throat and tinnitus.   Eyes: Negative for blurred vision and pain.  Respiratory: Negative for cough, hemoptysis, shortness of breath and wheezing.   Cardiovascular: Negative for chest pain, palpitations, orthopnea and leg swelling.  Gastrointestinal: Positive for abdominal pain. Negative for heartburn, nausea, vomiting, diarrhea, constipation and blood in stool.  Genitourinary: Negative for dysuria, urgency and frequency.  Musculoskeletal: Negative for myalgias and back pain.  Skin: Negative for itching and rash.  Neurological: Negative for dizziness, tingling, tremors, focal weakness, seizures, weakness and headaches.  Psychiatric/Behavioral: Negative for depression. The patient is not nervous/anxious.    DRUG ALLERGIES:   Allergies  Allergen Reactions  . Dilaudid [Hydromorphone Hcl] Other (See Comments)    Respiratory arrest  . Tomato Other (See Comments)    GI distress, respiratory distress   VITALS:  Blood pressure 148/72, pulse 93, temperature 98.3 F (36.8 C), temperature source Oral, resp. rate 17, height 6' (1.829 m), weight 107.502 kg (237 lb), SpO2 95 %. PHYSICAL EXAMINATION:  Physical Exam  Constitutional: He is oriented to person, place, and time and well-developed, well-nourished, and in no distress.  HENT:  Head: Normocephalic and atraumatic.  Eyes: Conjunctivae and EOM are normal. Pupils are equal, round, and reactive to light.  Neck: Normal range of motion. Neck supple. No tracheal deviation  present. No thyromegaly present.  Cardiovascular: Normal rate, regular rhythm and normal heart sounds.   Pulmonary/Chest: Effort normal and breath sounds normal. No respiratory distress. He has no wheezes. He exhibits no tenderness.  Abdominal: Soft. Bowel sounds are normal. He exhibits no distension. There is tenderness.  Musculoskeletal: Normal range of motion.  Neurological: He is alert and oriented to person, place, and time. No cranial nerve deficit.  Skin: Skin is warm and dry. No rash noted.  Psychiatric: Mood and affect normal.   LABORATORY PANEL:   CBC  Recent Labs Lab 10/30/14 0404  WBC 10.6  HGB 10.1*  HCT 30.2*  PLT 149*   ------------------------------------------------------------------------------------------------------------------ Chemistries   Recent Labs Lab 10/27/14 0841  10/30/14 0404  NA 135  < > 143  K 5.1  < > 4.2  CL 104  < > 112*  CO2 24  < > 26  GLUCOSE 205*  < > 140*  BUN 18  < > 37*  CREATININE 1.75*  < > 1.38*  CALCIUM 8.2*  < > 8.3*  AST 20  --   --   ALT 11*  --   --   ALKPHOS 75  --   --   BILITOT 1.5*  --   --   < > = values in this interval not displayed. ASSESSMENT AND PLAN:  * Diabetes Sugars running anywhere from 120s to 140s Continue moderate correction insulin regimen continue insulin sliding scale coverage with fingerstick checking.  Appreciate diabetes nurse input   * Hypertension/Tachycardia: much better controlled  HR running anywhere from 70s to 90s Continue oral metoprolol and keep Cardizem when necessary.  * Status post bowel reanastomosis surgery Manage per primary team. Continue morphine for pain management and  Zofran for nausea.  * Insomnia: Ambien as needed basis.  * Acute renal failure:His creatinine was 1.18 on April 2016, continue monitoring. It's 1.38 today   All the records are reviewed and case discussed with Care Management/Social Worker. Management plans discussed with the patient, family,  Dr. Juliann Pulse and they are in agreement.  CODE STATUS: Full Code  TOTAL TIME TAKING CARE OF THIS PATIENT: 15 minutes.   More than 50% of the time was spent in counseling/coordination of care: Scheryl Darter, Bahja Bence M.D on 10/30/2014 at 11:33 AM  Between 7am to 6pm - Pager - [phone]  After 6pm go to [email or web address] - password [email or web address] [facility]  [place] [facility]  Office  [phone]  CC:  Primary care physician; Clydie Braun, MD

## 2014-10-30 NOTE — Progress Notes (Signed)
Surgery Progress Note  S: C/O nausea, hiccups O:Blood pressure 135/64, pulse 76, temperature 98.1 F (36.7 C), temperature source Oral, resp. rate 14, height 6' (1.829 m), weight 237 lb (107.502 kg), SpO2 95 %. GEN: NAD/A&Ox3 ABD: soft, min tender, mild/moderate distention, dressing c/d/i  Labs WBC 10.6 Cr 1.38  A/P 74 yo s/p ostomy reversal, doing well.  More nausea, more distended, + hiccups - Due to complexity and length of surgery would not be surprised if he has developed an ileus.  Will back off diet for now - cont PCA

## 2014-10-31 DIAGNOSIS — Z433 Encounter for attention to colostomy: Secondary | ICD-10-CM | POA: Diagnosis not present

## 2014-10-31 LAB — BASIC METABOLIC PANEL
Anion gap: 3 — ABNORMAL LOW (ref 5–15)
BUN: 38 mg/dL — AB (ref 6–20)
CHLORIDE: 114 mmol/L — AB (ref 101–111)
CO2: 27 mmol/L (ref 22–32)
Calcium: 8.2 mg/dL — ABNORMAL LOW (ref 8.9–10.3)
Creatinine, Ser: 1.43 mg/dL — ABNORMAL HIGH (ref 0.61–1.24)
GFR calc Af Amer: 54 mL/min — ABNORMAL LOW (ref >60)
GFR, EST NON AFRICAN AMERICAN: 46 mL/min — AB (ref >60)
GLUCOSE: 119 mg/dL — AB (ref 65–99)
POTASSIUM: 4.3 mmol/L (ref 3.5–5.1)
Sodium: 144 mmol/L (ref 135–145)

## 2014-10-31 LAB — GLUCOSE, CAPILLARY
GLUCOSE-CAPILLARY: 177 mg/dL — AB (ref 65–99)
Glucose-Capillary: 117 mg/dL — ABNORMAL HIGH (ref 65–99)
Glucose-Capillary: 142 mg/dL — ABNORMAL HIGH (ref 65–99)
Glucose-Capillary: 135 mg/dL — ABNORMAL HIGH (ref 65–99)

## 2014-10-31 LAB — CBC
HCT: 28.1 % — ABNORMAL LOW (ref 40.0–52.0)
Hemoglobin: 9.2 g/dL — ABNORMAL LOW (ref 13.0–18.0)
MCH: 30.5 pg (ref 26.0–34.0)
MCHC: 32.8 g/dL (ref 32.0–36.0)
MCV: 92.8 fL (ref 80.0–100.0)
PLATELETS: 139 10*3/uL — AB (ref 150–440)
RBC: 3.03 MIL/uL — AB (ref 4.40–5.90)
RDW: 13.5 % (ref 11.5–14.5)
WBC: 8.4 10*3/uL (ref 3.8–10.6)

## 2014-10-31 MED ORDER — SODIUM CHLORIDE 0.45 % IV SOLN
INTRAVENOUS | Status: DC
  Administered 2014-10-31: 06:00:00 via INTRAVENOUS

## 2014-10-31 NOTE — Progress Notes (Signed)
[facility] - [place] at [facility]   PATIENT NAME: Jack White    MR#:  [id number]  DATE OF BIRTH:  11/02/1940  SUBJECTIVE:  CHIEF COMPLAINT:  No chief complaint on file. doing very well, had 4 BMs so far and tolerated full liquids also. Wanting to go   REVIEW OF SYSTEMS:  Review of Systems  Constitutional: Negative for fever, weight loss, malaise/fatigue and diaphoresis.  HENT: Negative for ear discharge, ear pain, hearing loss, nosebleeds, sore throat and tinnitus.   Eyes: Negative for blurred vision and pain.  Respiratory: Negative for cough, hemoptysis, shortness of breath and wheezing.   Cardiovascular: Negative for chest pain, palpitations, orthopnea and leg swelling.  Gastrointestinal: Negative for heartburn, nausea, vomiting, abdominal pain, diarrhea, constipation and blood in stool.  Genitourinary: Negative for dysuria, urgency and frequency.  Musculoskeletal: Negative for myalgias and back pain.  Skin: Negative for itching and rash.  Neurological: Negative for dizziness, tingling, tremors, focal weakness, seizures, weakness and headaches.  Psychiatric/Behavioral: Negative for depression. The patient is not nervous/anxious.    DRUG ALLERGIES:   Allergies  Allergen Reactions  . Dilaudid [Hydromorphone Hcl] Other (See Comments)    Respiratory arrest  . Tomato Other (See Comments)    GI distress, respiratory distress   VITALS:  Blood pressure 137/57, pulse 77, temperature 98.1 F (36.7 C), temperature source Oral, resp. rate 18, height 6' (1.829 m), weight 107.502 kg (237 lb), SpO2 97 %. PHYSICAL EXAMINATION:  Physical Exam  Constitutional: He is oriented to person, place, and time and well-developed, well-nourished, and in no distress.  HENT:  Head: Normocephalic and atraumatic.  Eyes: Conjunctivae and EOM are normal. Pupils are equal, round, and reactive to light.  Neck: Normal range of motion. Neck supple. No tracheal deviation present.  No thyromegaly present.  Cardiovascular: Normal rate, regular rhythm and normal heart sounds.   Pulmonary/Chest: Effort normal and breath sounds normal. No respiratory distress. He has no wheezes. He exhibits no tenderness.  Abdominal: Soft. Bowel sounds are normal. He exhibits no distension. There is tenderness.  Musculoskeletal: Normal range of motion.  Neurological: He is alert and oriented to person, place, and time. No cranial nerve deficit.  Skin: Skin is warm and dry. No rash noted.  Psychiatric: Mood and affect normal.   LABORATORY PANEL:   CBC  Recent Labs Lab 10/31/14 0449  WBC 8.4  HGB 9.2*  HCT 28.1*  PLT 139*   ------------------------------------------------------------------------------------------------------------------ Chemistries   Recent Labs Lab 10/27/14 0841  10/31/14 0449  NA 135  < > 144  K 5.1  < > 4.3  CL 104  < > 114*  CO2 24  < > 27  GLUCOSE 205*  < > 119*  BUN 18  < > 38*  CREATININE 1.75*  < > 1.43*  CALCIUM 8.2*  < > 8.2*  AST 20  --   --   ALT 11*  --   --   ALKPHOS 75  --   --   BILITOT 1.5*  --   --   < > = values in this interval not displayed. ASSESSMENT AND PLAN:  * Diabetes Sugars running anywhere from 120s to 130s Continue moderate correction insulin regimen continue insulin sliding scale coverage with fingerstick checking.  Appreciate diabetes nurse input   * Hypertension/Tachycardia: much better controlled  HR running anywhere from 70s to 90s Continue oral metoprolol and stop Cardizem  * Status post bowel reanastomosis surgery Manage per primary team. Continue morphine & toradol  for pain management and Zofran for nausea.  * Insomnia: Ambien as needed basis.  * Acute renal failure:His creatinine was 1.18 on April 2016, continue monitoring. It's 1.43 today   All the records are reviewed and case discussed with Care Management/Social Worker. Management plans discussed with the patient, family, Dr. Colette Ribas and they  are in agreement.  CODE STATUS: Full Code  TOTAL TIME TAKING CARE OF THIS PATIENT: 15 minutes.   More than 50% of the time was spent in counseling/coordination of care: YES   Discontinue telemetry, advance diet if okay with primary team, ambulation, wean off oxygen.     [facility], Tallia Moehring M.D on 10/31/2014 at 8:50 AM  Between 7am to 6pm - Pager - [phone]  After 6pm go to [email or web address] - password [email or web address] [facility]  [place] [facility]  Office  [phone]  CC:  Primary care physician; Clydie Braun, MD

## 2014-10-31 NOTE — Evaluation (Signed)
Physical Therapy Evaluation Patient Details Name: Jack White MRN: [id number] DOB: [birth year] Today's Date: 10/31/2014   History of Present Illness  Pt had colostomy, then a take down on 8/31 has been in bed until today  Clinical Impression  Pt is weak from being in bed for a few days, but ultimately is able to ambulate safely and with no LOBs.  He does feel fatigued with limited ambulation and is slower than normal but if he continues to do well he should be able to go home w/o HHPT, but likely will need some time before he is able to return to work.    Follow Up Recommendations No PT follow up;Home health PT (None if he continues to show good improvement)    Equipment Recommendations       Recommendations for Other Services       Precautions / Restrictions Precautions Precautions: Fall Restrictions Weight Bearing Restrictions: No      Mobility  Bed Mobility Overal bed mobility: Independent                Transfers Overall transfer level: Independent               General transfer comment: Pt is able to stand w/o issue, has stooped posture  Ambulation/Gait Ambulation/Gait assistance: Supervision Ambulation Distance (Feet): 100 Feet Assistive device: None       General Gait Details: Pt has a baseline limp and forward posture and though he is walking slower than his baseline he is safe throughout the effort.  He has no LOBs but does fatigue signficantly.   Stairs            Wheelchair Mobility    Modified Rankin (Stroke Patients Only)       Balance Overall balance assessment: Independent                                           Pertinent Vitals/Pain Pain Assessment: 0-10 Pain Score: 3     Home Living Family/patient expects to be discharged to:: Private residence Living Arrangements: Spouse/significant other Available Help at Discharge: Family Type of Home: House Home Access: Stairs to enter Entrance  Stairs-Rails: Can reach both Entrance Stairs-Number of Steps: 5          Prior Function Level of Independence: Independent (works 9+ hour days)               Hand Dominance        Extremity/Trunk Assessment   Upper Extremity Assessment: Overall WFL for tasks assessed           Lower Extremity Assessment: Overall WFL for tasks assessed         Communication   Communication: No difficulties  Cognition Arousal/Alertness: Awake/alert Behavior During Therapy: WFL for tasks assessed/performed Overall Cognitive Status: Within Functional Limits for tasks assessed                      General Comments      Exercises        Assessment/Plan    PT Assessment Patient needs continued PT services  PT Diagnosis Difficulty walking;Generalized weakness   PT Problem List Decreased strength;Decreased balance;Decreased activity tolerance;Decreased mobility;Decreased coordination;Decreased cognition;Decreased knowledge of use of DME  PT Treatment Interventions Gait training;Therapeutic activities;Therapeutic exercise;Balance training;Neuromuscular re-education;Stair training;Functional mobility training   PT Goals (Current goals can be found  in the Care Plan section) Acute Rehab PT Goals Patient Stated Goal: "I'd go home today if they'd let me" PT Goal Formulation: With patient/family Time For Goal Achievement: 11/14/14 Potential to Achieve Goals: Good    Frequency Min 2X/week   Barriers to discharge        Co-evaluation               End of Session Equipment Utilized During Treatment: Gait belt Activity Tolerance: Patient tolerated treatment well;Patient limited by fatigue Patient left: with bed alarm set           Time: 1610-9604 PT Time Calculation (min) (ACUTE ONLY): 27 min   Charges:   PT Evaluation $Initial PT Evaluation Tier I: 1 Procedure     PT G Codes:       Loran Senters, PT, DPT [phone]  Malachi Pro 10/31/2014, 6:35  PM

## 2014-10-31 NOTE — Care Management (Signed)
Patient had planned colostomy reversal and internal medicine consult for management of DM.  Was transferred to 2A due to tachycardia.  He required some IV meds for heart rate control.  Heart rate is now stable.  Patient is having bowel movements and started on thin  Liquids.  If tolerates will advance diet further and anticipate discharge 9/7.  PT consult is pending

## 2014-10-31 NOTE — Care Management Important Message (Signed)
Important Message  Patient Details  Name: Jack White MRN: [id number] Date of Birth: [birth year]   Medicare Important Message Given:  Yes-second notification given    Eber Hong, RN 10/31/2014, 8:25 AM

## 2014-10-31 NOTE — Progress Notes (Signed)
Patient ID: Jack White, male   DOB: November 19, 1940, 74 y.o.   MRN: [id number]   Surgery  POD 5  S/P colostomy reversal.  He has had several bowel movements. His pain is well-controlled.   Filed Vitals:   10/30/14 2026 10/31/14 0413 10/31/14 0936 10/31/14 1106  BP:  137/57 117/50 133/55  Pulse:  77 92 78  Temp:  98.1 F (36.7 C) 98.2 F (36.8 C) 98.4 F (36.9 C)  TempSrc:  Oral Oral Oral  Resp: Height:      Weight:      SpO2: 98% 97% 94% 94%    PE: Dressing is intact and was just changed. His abdomen is soft and nontender.  Labs  CBC Latest Ref Rng 10/31/2014 10/30/2014 10/29/2014  WBC 3.8 - 10.6 K/uL 8.4 10.6 13.7(H)  Hemoglobin 13.0 - 18.0 g/dL 4.7(W) 10.1(L) 10.9(L)  Hematocrit 40.0 - 52.0 % 28.1(L) 30.2(L) 33.0(L)  Platelets 150 - 440 K/uL 139(L) 149(L) 144(L)   CMP Latest Ref Rng 10/31/2014 10/30/2014 10/29/2014  Glucose 65 - 99 mg/dL 295(A) 213(Y) 865(H)  BUN 6 - 20 mg/dL 84(O) 96(E) 95(M)  Creatinine 0.61 - 1.24 mg/dL 8.41(L) 2.44(W) 1.02(V)  Sodium 135 - 145 mmol/L 144 143 142  Potassium 3.5 - 5.1 mmol/L 4.3 4.2 5.1  Chloride 101 - 111 mmol/L 114(H) 112(H) 112(H)  CO2 22 - 32 mmol/L Calcium 8.9 - 10.3 mg/dL 8.2(L) 8.3(L) 8.1(L)  Total Protein 6.5 - 8.1 g/dL - - -  Total Bilirubin 0.3 - 1.2 mg/dL - - -  Alkaline Phos 38 - 126 U/L - - -  AST 15 - 41 U/L - - -  ALT 17 - 63 U/L - - -     IMP overall doing well postop day 5.  Plan advance diet,  tentatively home Wednesday.

## 2014-11-01 LAB — BASIC METABOLIC PANEL
Anion gap: 4 — ABNORMAL LOW (ref 5–15)
BUN: 35 mg/dL — AB (ref 6–20)
CHLORIDE: 112 mmol/L — AB (ref 101–111)
CO2: 27 mmol/L (ref 22–32)
CREATININE: 1.53 mg/dL — AB (ref 0.61–1.24)
Calcium: 8.1 mg/dL — ABNORMAL LOW (ref 8.9–10.3)
GFR calc Af Amer: 50 mL/min — ABNORMAL LOW (ref >60)
GFR, EST NON AFRICAN AMERICAN: 43 mL/min — AB (ref >60)
Glucose, Bld: 131 mg/dL — ABNORMAL HIGH (ref 65–99)
Potassium: 3.8 mmol/L (ref 3.5–5.1)
SODIUM: 143 mmol/L (ref 135–145)

## 2014-11-01 LAB — GLUCOSE, CAPILLARY: GLUCOSE-CAPILLARY: 131 mg/dL — AB (ref 65–99)

## 2014-11-01 MED ORDER — ONDANSETRON 4 MG PO TBDP: 4.0000 mg | ORAL_TABLET | Freq: Three times a day (TID) | ORAL | Status: DC | PRN

## 2014-11-01 MED ORDER — HYDROCODONE-ACETAMINOPHEN 5-325 MG PO TABS: 1.0000 | ORAL_TABLET | Freq: Four times a day (QID) | ORAL | Status: DC | PRN

## 2014-11-01 NOTE — Discharge Instructions (Signed)
DISCHARGE INSTRUCTIONS TO PATIENT ° °REMINDER:  °· Carry a list of your medications and allergies with you at all times °· Call your pharmacy at least 1 week in advance to refill prescriptions °· Do not mix any prescribed pain medicine with alcohol °· Do not drive any motor vehicles while taking pain medication. °· Take medications with food.  Do not retake a pain medication if you vomit after taking it. ° °Activity: no lifting more than 15 pounds until instructed by your doctor. ° ° °Dressing Care Instruction (if applicable): ° °            Remove operative dressings in 48 hours. ° May Shower-  Call office if any questions regarding this activity. ° Dry Dressing as needed to operative site. ° Drain care instructions provided to you in the hospital. ° ° °Follow-up appointments (date to return to physician): °Call for appointment with Dr. Jermisha Hoffart, MD at [phone] or [phone] ° °If need MD on call after hours and on weekends call Hospital operator at [phone] as ask to speak to Surgeon on call for [facility]. ° °Call Surgeon if you have: °· Temperature greater than 100.4 °· Persistent nausea and vomiting °· Severe uncontrolled pain °· Redness, tenderness, or signs of infection (pain, swelling, redness, odor or green/yellow discharge around the site) °· Difficulty breathing, headache or visual disturbances °· Hives °· Persistent dizziness or light-headedness °· Extreme fatigue °· Any other questions or concerns you may have after discharge ° °In an emergency, call 911 or go to an Emergency Department at a nearby hospital ° °Diet: ° Resume your usual diet.  Avoid spicy, greasy or heavy foods.  If you have nausea or vomiting, go back to liquids.  If you cannot keep liquids down, call your doctor.  Avoid alcohol consumption while on prescription pain medications. Good nutrition promotes healing. Increase fiber and fluids.  ° ° ° °I understand and acknowledge receipt of the above instructions.   ° ° °                                                                                                                                    °Patient or Guardian Signature                                                                    Date/Time ° ° °                                                                                                                                     °  Physician's or R.N.'s Signature                                                                  Date/Time ° °The discharge instructions have been reviewed with the patient and/or Family Member/Parent/Guardian.  Patient and/or Family Member/Parent/Guardian signed and retained a printed copy. ° °

## 2014-11-01 NOTE — Progress Notes (Signed)
Patient ID: Jack White, male   DOB: 1940/11/21, 74 y.o.   MRN: [id number]  Surgery.  Postoperative day #6.  He's had several bowel movements. He is tolerating a regular diet. He is interested discharged home.  Filed Vitals:   10/31/14 1631 10/31/14 2209 10/31/14 2306 11/01/14 0805  BP: 136/58 152/67 129/51 141/67  Pulse: 73 75 65 72  Temp: 98.5 F (36.9 C)  98.3 F (36.8 C) 98.1 F (36.7 C)  TempSrc: Oral  Oral Oral  Resp: Height:      Weight:      SpO2: 96%  94% 95%    His abdomen is soft. His wound is dry. Penrose drains remained in place. Is moderate drainage. Dressing was reapplied by nurse with the wife present.  Impression stable and improved for discharge postoperative day #6 status post colostomy reversal.  Plan I'll have the patient follow up with [place] in the office on Friday.: Any questions. Orders written.

## 2014-11-01 NOTE — Progress Notes (Signed)
A&O. VSS. Tolerating diet well. No complaints of pain or nausea. Resting comfortably. Dressings changed. Penrose drains in place. Pt tolerating well. Discharged per MD orders. Discharge instructions reviewed with pt and pt verbalized understanding. IV removed per policy. Prescriptions given to pt. Discharged via wheelchair escorted by auxilary.

## 2014-11-01 NOTE — Progress Notes (Signed)
[facility] [place] at [facility]   PATIENT NAME: Jack White    MR#:  [id number]  DATE OF BIRTH:  March 16, 1940  SUBJECTIVE:  CHIEF COMPLAINT:  No chief complaint on file. doing very well, wanting to go REVIEW OF SYSTEMS:  Review of Systems  Constitutional: Negative for fever, weight loss, malaise/fatigue and diaphoresis.  HENT: Negative for ear discharge, ear pain, hearing loss, nosebleeds, sore throat and tinnitus.   Eyes: Negative for blurred vision and pain.  Respiratory: Negative for cough, hemoptysis, shortness of breath and wheezing.   Cardiovascular: Negative for chest pain, palpitations, orthopnea and leg swelling.  Gastrointestinal: Negative for heartburn, nausea, vomiting, abdominal pain, diarrhea, constipation and blood in stool.  Genitourinary: Negative for dysuria, urgency and frequency.  Musculoskeletal: Negative for myalgias and back pain.  Skin: Negative for itching and rash.  Neurological: Negative for dizziness, tingling, tremors, focal weakness, seizures, weakness and headaches.  Psychiatric/Behavioral: Negative for depression. The patient is not nervous/anxious.    DRUG ALLERGIES:   Allergies  Allergen Reactions  . Dilaudid [Hydromorphone Hcl] Other (See Comments)    Respiratory arrest  . Tomato Other (See Comments)    GI distress, respiratory distress   VITALS:  Blood pressure 141/67, pulse 72, temperature 98.1 F (36.7 C), temperature source Oral, resp. rate 17, height 6' (1.829 m), weight 107.502 kg (237 lb), SpO2 95 %. PHYSICAL EXAMINATION:  Physical Exam  Constitutional: He is oriented to person, place, and time and well-developed, well-nourished, and in no distress.  HENT:  Head: Normocephalic and atraumatic.  Eyes: Conjunctivae and EOM are normal. Pupils are equal, round, and reactive to light.  Neck: Normal range of motion. Neck supple. No tracheal deviation present. No thyromegaly present.  Cardiovascular: Normal  rate, regular rhythm and normal heart sounds.   Pulmonary/Chest: Effort normal and breath sounds normal. No respiratory distress. He has no wheezes. He exhibits no tenderness.  Abdominal: Soft. Bowel sounds are normal. He exhibits no distension. There is no tenderness.  Musculoskeletal: Normal range of motion.  Neurological: He is alert and oriented to person, place, and time. No cranial nerve deficit.  Skin: Skin is warm and dry. No rash noted.  Psychiatric: Mood and affect normal.   LABORATORY PANEL:   CBC  Recent Labs Lab 10/31/14 0449  WBC 8.4  HGB 9.2*  HCT 28.1*  PLT 139*   ------------------------------------------------------------------------------------------------------------------ Chemistries   Recent Labs Lab 10/27/14 0841  11/01/14 0515  NA 135  < > 143  K 5.1  < > 3.8  CL 104  < > 112*  CO2 24  < > 27  GLUCOSE 205*  < > 131*  BUN 18  < > 35*  CREATININE 1.75*  < > 1.53*  CALCIUM 8.2*  < > 8.1*  AST 20  --   --   ALT 11*  --   --   ALKPHOS 75  --   --   BILITOT 1.5*  --   --   < > = values in this interval not displayed. ASSESSMENT AND PLAN:  * Diabetes Sugars running anywhere from 120s to 130s Continue moderate correction insulin regimen continue insulin sliding scale coverage with fingerstick checking.  Appreciate diabetes nurse input   * Hypertension/Tachycardia: much better controlled  HR running anywhere from 70s to 90s Continue oral metoprolol   * Status post bowel reanastomosis surgery Manage per primary team. Continue morphine & toradol for pain management and Zofran for nausea.  * Insomnia: Ambien as needed  basis.  * Acute renal failure:His creatinine was 1.18 on April 2016, continue monitoring. It's 1.53 today   All the records are reviewed and case discussed with Care Management/Social Worker. Management plans discussed with the patient, family, Dr. Colette Ribas and they are in agreement.  CODE STATUS: Full Code  TOTAL TIME TAKING  CARE OF THIS PATIENT: 15 minutes.   More than 50% of the time was spent in counseling/coordination of care: YES   Medically stable, okay to be discharged if primary team is okay   [facility], Sherrol Vicars M.D on 11/01/2014 at 12:34 PM  Between 7am to 6pm - Pager - [phone]  After 6pm go to [email or web address] - password [email or web address] [facility]  [facility]  Office  [phone]  CC:  Primary care physician; Clydie Braun, MD

## 2014-11-01 NOTE — Discharge Summary (Signed)
Physician Discharge Summary  Patient ID: Jack White MRN: [id number] DOB/AGE: Jul 22, 1940 74 y.o.  Admit date: 10/26/2014 Discharge date: 11/01/2014  Admission Diagnoses:  Discharge Diagnoses:  Active Problems:   S/P colostomy   Status post colostomy   Discharged Condition: Stable  Hospital Course:  The patient was admitted following a colostomy reversal. Postoperative course was marked by ileus. This resolved. Diet was able to be advanced. Had return of bowel function. Wound was healing nicely. Pain was well-controlled with oral pain medications. Discharged home on postoperative day #6.     Discharge Exam: Blood pressure 141/67, pulse 72, temperature 98.1 F (36.7 C), temperature source Oral, resp. rate 17, height 6' (1.829 m), weight 237 lb (107.502 kg), SpO2 95 %. wound was healing nicely. Abdomen was soft. Patient is alert and oriented 4.  Disposition: Home.     Medication List    STOP taking these medications        bisacodyl 5 MG EC tablet  Commonly known as:  DULCOLAX     feeding supplement (ENSURE ENLIVE) Liqd     polyethylene glycol powder powder  Commonly known as:  GLYCOLAX/MIRALAX     sodium phosphate 7-19 GM/118ML Enem      TAKE these medications        aspirin 81 MG tablet  Take 81 mg by mouth daily.     atorvastatin 10 MG tablet  Commonly known as:  LIPITOR     butalbital-acetaminophen-caffeine 50-325-40 MG per tablet  Commonly known as:  FIORICET  Take 1-2 tablets by mouth every 6 (six) hours as needed for headache.     celecoxib 200 MG capsule  Commonly known as:  CELEBREX  Take 200 mg by mouth 2 (two) times daily.     doxazosin 2 MG tablet  Commonly known as:  CARDURA  Take 2 mg by mouth 2 (two) times daily.     HYDROcodone-acetaminophen 5-325 MG per tablet  Commonly known as:  NORCO  Take 1-2 tablets by mouth every 6 (six) hours as needed for moderate pain.     hydrocortisone cream 1 %  Apply topically 3 (three) times daily.      hydrOXYzine 10 MG tablet  Commonly known as:  ATARAX/VISTARIL  Take 1 tablet (10 mg total) by mouth every 6 (six) hours as needed for itching.     lisinopril 2.5 MG tablet  Commonly known as:  PRINIVIL,ZESTRIL  2.5 mg daily.     loperamide 2 MG capsule  Commonly known as:  IMODIUM  Take 1 capsule (2 mg total) by mouth as needed for diarrhea or loose stools (loose stools via ostomy.).     Melatonin 10 MG Caps  Take 1 capsule by mouth at bedtime.     metFORMIN 1000 MG tablet  Commonly known as:  GLUCOPHAGE  Take 1,000 mg by mouth every morning.     metoprolol tartrate 25 MG tablet  Commonly known as:  LOPRESSOR  Take 25 mg by mouth 2 (two) times daily.     ondansetron 4 MG disintegrating tablet  Commonly known as:  ZOFRAN ODT  Take 1 tablet (4 mg total) by mouth every 8 (eight) hours as needed for nausea or vomiting.     zolpidem 5 MG tablet  Commonly known as:  AMBIEN  Take 1 tablet (5 mg total) by mouth at bedtime as needed for sleep.           Follow-up Information    Follow up with Ida Rogue, MD  On 11/04/2014.   Specialty:  Surgery   Why:  For suture removal, For wound re-check   Contact information:   [address] [place] [address] [phone]       Signed: Natale Lay 11/01/2014, 11:25 AM

## 2014-11-02 DIAGNOSIS — Z933 Colostomy status: Secondary | ICD-10-CM | POA: Insufficient documentation

## 2014-11-03 ENCOUNTER — Ambulatory Visit (INDEPENDENT_AMBULATORY_CARE_PROVIDER_SITE_OTHER): Payer: Medicare Other | Admitting: Surgery

## 2014-11-03 ENCOUNTER — Encounter: Payer: Self-pay | Admitting: Surgery

## 2014-11-03 VITALS — BP 97/59 | HR 93 | Temp 98.3°F | Ht 72.0 in | Wt 230.0 lb

## 2014-11-03 DIAGNOSIS — Z09 Encounter for follow-up examination after completed treatment for conditions other than malignant neoplasm: Secondary | ICD-10-CM

## 2014-11-03 MED ORDER — ZOLPIDEM TARTRATE 5 MG PO TABS: 2.5000 mg | ORAL_TABLET | Freq: Every evening | ORAL | Status: DC | PRN

## 2014-11-03 NOTE — Patient Instructions (Addendum)
Follow-up on 11/10/14 in [place] office with Dr. Michela Pitcher at 2pm.  Your drains have been removed today, these will continue to drain. Please keep covered until draining subsides. Then you may leave open to air at home. If you go out, you will want to cover this area as you have done today.  Please call with any questions or concerns.

## 2014-11-03 NOTE — Progress Notes (Signed)
Surgery Clinic Note  S: Doing well.  Having good BM.  Minimal pain. Some drainage from wound O:Blood pressure 97/59, pulse 93, temperature 98.3 F (36.8 C), temperature source Oral, height 6' (1.829 m), weight 230 lb (104.327 kg). GEN: NAD/A&Ox3 ABD: soft, min tender, nondistended, incisions c/d/i with no erythema/induration, purulence  A/P s/p ostomy reversal, doing well - prn vicodin - penroses removed, leave staples - asked for refill on ambien, told patient to use only when benedryl is not able to induce sleep and to follow up with Dr. Sampson Goon who will manage this - f/u 1 week for staple removal

## 2014-11-07 ENCOUNTER — Telehealth: Payer: Self-pay

## 2014-11-07 NOTE — Telephone Encounter (Signed)
Patient called in and states that Bowels have been soft since last Thursday. Over the weekend have became just liquid and have been moving every 2-3 hours, no abdominal pain, denies nausea and vomiting. Appetite is well.   His wife is really concerned with dehydration and is very anxious about this.  Moved appointment up with Dr. Michela Pitcher until tomorrow to get patient seen sooner and address this concern.

## 2014-11-08 ENCOUNTER — Ambulatory Visit (INDEPENDENT_AMBULATORY_CARE_PROVIDER_SITE_OTHER): Payer: Medicare Other | Admitting: Surgery

## 2014-11-08 ENCOUNTER — Other Ambulatory Visit
Admission: RE | Admit: 2014-11-08 | Discharge: 2014-11-08 | Disposition: A | Discharge status: Home / Self Care | Payer: Medicare Other | Source: Ambulatory Visit | Attending: Surgery | Admitting: Surgery

## 2014-11-08 ENCOUNTER — Encounter: Payer: Self-pay | Admitting: Surgery

## 2014-11-08 VITALS — BP 158/70 | HR 103 | Temp 98.2°F | Resp 30 | Ht 72.0 in | Wt 238.8 lb

## 2014-11-08 DIAGNOSIS — R197 Diarrhea, unspecified: Secondary | ICD-10-CM

## 2014-11-08 DIAGNOSIS — Z9889 Other specified postprocedural states: Secondary | ICD-10-CM

## 2014-11-08 LAB — CBC WITH DIFFERENTIAL/PLATELET
BASOS PCT: 0 %
Basophils Absolute: 0 10*3/uL (ref 0–0.1)
EOS ABS: 0.4 10*3/uL (ref 0–0.7)
EOS PCT: 3 %
HCT: 30.2 % — ABNORMAL LOW (ref 40.0–52.0)
HEMOGLOBIN: 9.9 g/dL — AB (ref 13.0–18.0)
Lymphocytes Relative: 21 %
Lymphs Abs: 2.2 10*3/uL (ref 1.0–3.6)
MCH: 30.4 pg (ref 26.0–34.0)
MCHC: 32.9 g/dL (ref 32.0–36.0)
MCV: 92.5 fL (ref 80.0–100.0)
MONOS PCT: 6 %
Monocytes Absolute: 0.6 10*3/uL (ref 0.2–1.0)
NEUTROS PCT: 70 %
Neutro Abs: 7.7 10*3/uL — ABNORMAL HIGH (ref 1.4–6.5)
PLATELETS: 238 10*3/uL (ref 150–440)
RBC: 3.27 MIL/uL — ABNORMAL LOW (ref 4.40–5.90)
RDW: 13.7 % (ref 11.5–14.5)
WBC: 10.9 10*3/uL — AB (ref 3.8–10.6)

## 2014-11-08 LAB — CLOSTRIDIUM DIFFICILE BY PCR

## 2014-11-08 NOTE — Patient Instructions (Signed)
You will need to go over to the hospital right now and give a stool sample and get your blood drawn. Please sign in at the Admitting and Registration desk in the medical mall.  You have been given 2 prescriptions today, please go to [facility] to pick up your cream. You may get the antibiotic filled at [facility] or at your own pharmacy.

## 2014-11-08 NOTE — Progress Notes (Signed)
Outpatient Surgical Follow Up  11/08/2014  Jack White is an 74 y.o. male.   Chief Complaint  Patient presents with  . Routine Post Op    Colostomy Takedown (10/26/14)  . Diarrhea    HPI: He returns unexpectedly with complaints of marked diarrhea. He's had no fever or chills. He had normal bowel function up to this most recent episode. He denies any foul-smelling stool. Most of the stool has been water. He's been feeling well otherwise.  Past Medical History  Diagnosis Date  . Colon perforation 05/28/2014  . Acute cholecystitis 05/21/14  . Hypertension   . Headache   . Diabetes mellitus without complication   . BP (high blood pressure) 10/14/2014  . 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 Chronic kidney disease (CKD), stage III (moderate) 02/15/2014    Past Surgical History  Procedure Laterality Date  . Laparoscopic cholecystectomy  05/21/2014    Dr. Michela Pitcher  . Exploratory laparotomy  05/28/2014    Dr. Anda Kraft  . Colostomy  05/28/2014    Dr. Anda Kraft  . Colostomy takedown N/A 10/26/2014    Procedure: COLOSTOMY TAKEDOWN;  Surgeon: Tiney Rouge III, MD;  Location: [facility] ORS;  Service: General;  Laterality: N/A;    Family History  Problem Relation Age of Onset  . Diabetes Mother   . Hypertension Mother   . Heart disease Father     Social History:  reports that he has never smoked. He has never used smokeless tobacco. He reports that he does not drink alcohol or use illicit drugs.  Allergies:  Allergies  Allergen Reactions  . Dilaudid [Hydromorphone Hcl] Other (See Comments)    Respiratory arrest  . Tomato Other (See Comments)    GI distress, respiratory distress    Medications reviewed.    ROS no change    BP 158/70 mmHg  Pulse 103  Temp(Src) 98.2 F (36.8 C) (Oral)  Resp 30  Ht 6' (1.829 m)  Wt 108.319 kg (238 lb 12.8 oz)  BMI 32.38 kg/m2  SpO2 95%  Physical Exam's abdomen is soft nontender with no significant discomfort  noted. His wounds look good with some mild serosanguineous drainage. There is no evidence of infection. He does have active bowel sounds.     No results found for this or any previous visit (from the past 48 hour(s)). No results found.  Assessment/Plan:  1. Diarrhea We will check out his diarrhea and see if indeed he does have any evidence for antibiotic related infection. We will also start him on some Flagyl over the next couple of days while obtaining those results. - CBC with Differential - Clostridium difficile culture-fecal  2. S/P colostomy takedown We'll see him back in the office next week for further follow-up to consider wound care. I will not remove his staples as yet.     Tiney Rouge III  11/08/2014,negative

## 2014-11-09 ENCOUNTER — Telehealth: Payer: Self-pay | Admitting: Surgery

## 2014-11-09 LAB — C DIFFICILE QUICK SCREEN W PCR REFLEX
C DIFFICILE (CDIFF) TOXIN: NEGATIVE
C DIFFICLE (CDIFF) ANTIGEN: POSITIVE — AB

## 2014-11-09 MED ORDER — METRONIDAZOLE 500 MG PO TABS: 500.0000 mg | ORAL_TABLET | Freq: Three times a day (TID) | ORAL | Status: DC

## 2014-11-09 NOTE — Telephone Encounter (Signed)
Chrystal from [facility] lab called stating that patient's lab results for C-Diff was positive.  I will let Dr. Michela Pitcher know.

## 2014-11-09 NOTE — Telephone Encounter (Signed)
Sent order for Flagyl to patient's pharmacy at this time. Call made to patient. Results of CBC and C-diff given. Reviewed cleaning bathroom after bowel movements with bleach and washing hands after any restroom use. Scheduled patient for follow-up in office with Dr. Michela Pitcher on 9/22 at 2:45pm in [place].   Pt readback all instructions given and verbalizes understanding.  Wife was unavailable at this time but will have her call if she has any questions regarding any of the instructions.

## 2014-11-09 NOTE — Telephone Encounter (Signed)
Jack White from [facility] left a voice message that this patient tested positive for C Diff

## 2014-11-10 ENCOUNTER — Encounter: Payer: Medicare Other | Admitting: Surgery

## 2014-11-17 ENCOUNTER — Encounter: Payer: Self-pay | Admitting: Surgery

## 2014-11-17 ENCOUNTER — Ambulatory Visit (INDEPENDENT_AMBULATORY_CARE_PROVIDER_SITE_OTHER): Payer: Medicare Other | Admitting: Surgery

## 2014-11-17 VITALS — BP 111/66 | HR 105 | Temp 98.0°F | Ht 72.0 in | Wt 238.8 lb

## 2014-11-17 DIAGNOSIS — K8000 Calculus of gallbladder with acute cholecystitis without obstruction: Secondary | ICD-10-CM

## 2014-11-17 NOTE — Progress Notes (Signed)
Outpatient Surgical Follow Up  11/17/2014  Jack White is an 74 y.o. male.   Chief Complaint  Patient presents with  . Routine Post Op    Colostomy Takedown (11/09/14)- Dr. Michela Pitcher    HPI: He returns for follow-up again. On her last visit he was having significant diarrhea. His white blood cell count was not significantly elevated but his C. difficile titer was elevated. He was started on Flagyl and he's done well in the interim. He had his first solid stool yesterday. He has no further abdominal pain. His appetite is improving and overall he feels so he is returning to near normal.  Past Medical History  Diagnosis Date  . Colon perforation 05/28/2014  . Acute cholecystitis 05/21/14  . Hypertension   . Headache   . Diabetes mellitus without complication   . BP (high blood pressure) 10/14/2014  . 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 Chronic kidney disease (CKD), stage III (moderate) 02/15/2014    Past Surgical History  Procedure Laterality Date  . Laparoscopic cholecystectomy  05/21/2014    Dr. Michela Pitcher  . Exploratory laparotomy  05/28/2014    Dr. Anda Kraft  . Colostomy  05/28/2014    Dr. Anda Kraft  . Colostomy takedown N/A 10/26/2014    Procedure: COLOSTOMY TAKEDOWN;  Surgeon: Tiney Rouge III, MD;  Location: [facility] ORS;  Service: General;  Laterality: N/A;    Family History  Problem Relation Age of Onset  . Diabetes Mother   . Hypertension Mother   . Heart disease Father     Social History:  reports that he has never smoked. He has never used smokeless tobacco. He reports that he does not drink alcohol or use illicit drugs.  Allergies:  Allergies  Allergen Reactions  . Dilaudid [Hydromorphone Hcl] Other (See Comments)    Respiratory arrest  . Tomato Other (See Comments)    GI distress, respiratory distress    Medications reviewed.    ROS    BP 111/66 mmHg  Pulse 105  Temp(Src) 98 F (36.7 C) (Oral)  Ht 6' (1.829 m)  Wt 108.319 kg (238  lb 12.8 oz)  BMI 32.38 kg/m2  Physical Exam His abdomen looks good. He still has some irritation around both incisions from the ostomy site. We removed half of his skin clips. Steri-Strips were applied.    No results found for this or any previous visit (from the past 48 hour(s)). No results found.  Assessment/Plan:  1. Calculus of gallbladder with acute cholecystitis without obstruction He continues to slowly recover but is improving from his clostridia infection. We'll continue his antibiotics through the full course. We'll see back in the office next week to take out the remainder of his clips. We've set him up for return to work early in October. Overall he is recovering nicely. Wife was present for the interview.     Tiney Rouge III  11/17/2014,negative

## 2014-11-24 ENCOUNTER — Ambulatory Visit (INDEPENDENT_AMBULATORY_CARE_PROVIDER_SITE_OTHER): Payer: Medicare Other | Admitting: General Surgery

## 2014-11-24 ENCOUNTER — Encounter: Payer: Self-pay | Admitting: General Surgery

## 2014-11-24 VITALS — BP 122/69 | HR 102 | Temp 98.3°F | Ht 72.0 in | Wt 233.4 lb

## 2014-11-24 DIAGNOSIS — Z8719 Personal history of other diseases of the digestive system: Secondary | ICD-10-CM

## 2014-11-24 DIAGNOSIS — IMO0002 Reserved for concepts with insufficient information to code with codable children: Secondary | ICD-10-CM

## 2014-11-24 NOTE — Progress Notes (Signed)
Outpatient Surgical Follow Up  11/24/2014  Jack White is an 74 y.o. male.   Chief Complaint  Patient presents with  . Routine Post Op    Post-op: Colostomy Takedown (11/09/14)- Dr. Michela Pitcher    HPI: 74 year old male returns to clinic for follow-up status post colostomy reversal. Patient reports doing well since last visit. He continues to take by mouth Flagyl but has had formed stools this week. Denies any fevers, chills, nausea, vomiting, diarrhea, constipation. Very happy with the surgical results. Interested in having the remainder Staples removed. Returning to work next week.  Past Medical History  Diagnosis Date  . Colon perforation 05/28/2014  . Acute cholecystitis 05/21/14  . Hypertension   . Headache   . Diabetes mellitus without complication   . BP (high blood pressure) 10/14/2014  . 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 Chronic kidney disease (CKD), stage III (moderate) 02/15/2014    Past Surgical History  Procedure Laterality Date  . Laparoscopic cholecystectomy  05/21/2014    Dr. Michela Pitcher  . Exploratory laparotomy  05/28/2014    Dr. Anda Kraft  . Colostomy  05/28/2014    Dr. Anda Kraft  . Colostomy takedown N/A 10/26/2014    Procedure: COLOSTOMY TAKEDOWN;  Surgeon: Tiney Rouge III, MD;  Location: [facility] ORS;  Service: General;  Laterality: N/A;    Family History  Problem Relation Age of Onset  . Diabetes Mother   . Hypertension Mother   . Heart disease Father     Social History:  reports that he has never smoked. He has never used smokeless tobacco. He reports that he does not drink alcohol or use illicit drugs.  Allergies:  Allergies  Allergen Reactions  . Dilaudid [Hydromorphone Hcl] Other (See Comments)    Respiratory arrest  . Tomato Other (See Comments)    GI distress, respiratory distress    Medications reviewed.    ROS A multipoint review of systems was completed. All pertinent positives negative as were within the history of  present illness remainder were negative.   BP 122/69 mmHg  Pulse 102  Temp(Src) 98.3 F (36.8 C) (Oral)  Ht 6' (1.829 m)  Wt 105.87 kg (233 lb 6.4 oz)  BMI 31.65 kg/m2  Physical Exam Gen.: No acute distress Chest: Clear to auscultation Heart: Regular rate and rhythm Abdomen: Soft, nontender, nondistended. Midline incision and colostomy sites with staples in place. No erythema, drainage, signs of infection.    No results found for this or any previous visit (from the past 48 hour(s)). No results found.  Assessment/Plan:  1. H/O colostomy 74 year old male returns to clinic for continued wound care in stable removal. Staples removed without difficulty and replaced with Steri-Strips. Discussed with patient the signs and symptoms of infection and herniation and return to clinic medially should they occur. Otherwise discussed with patient appropriate wound care treatments for Steri-Strips and he is to follow-up when necessary.     Ricarda Frame  11/24/2014,negative

## 2014-11-24 NOTE — Patient Instructions (Signed)
Your incisions look wonderful today. We have taken all the rest of the staples out today and have placed steri-strips on once again.   You can take a shower daily with soap and water. But do not submerge in water (ex. Bathtub, hot tub, or a pool).  Finish your antibiotics and then you should be good. If the diarrhea returns after stopping your medication, give me a call immediately.  If you have any redness, pain, or drainage; give me a call and we will work you in.

## 2016-08-29 IMAGING — CT CT ABD-PELV W/O CM
2 of 4 series · 15 of 46 positions shown, 17 images · non-contrast
Comparison: CT 05/18/2014

CLINICAL DATA: Postcholecystectomy 05/21/2014, now with stool in JP
drain. Delirium.

EXAM:
CT ABDOMEN AND PELVIS WITHOUT CONTRAST
TECHNIQUE: Multidetector CT imaging of the abdomen and pelvis was performed
following the standard protocol without IV contrast.

[Series 2: routine abd pel without · axial · non-contrast · 0.86mm/px · z∈[-988,-528]mm · 12 of 110 slices shown, 14 images]
[im 9/110  soft-tissue]
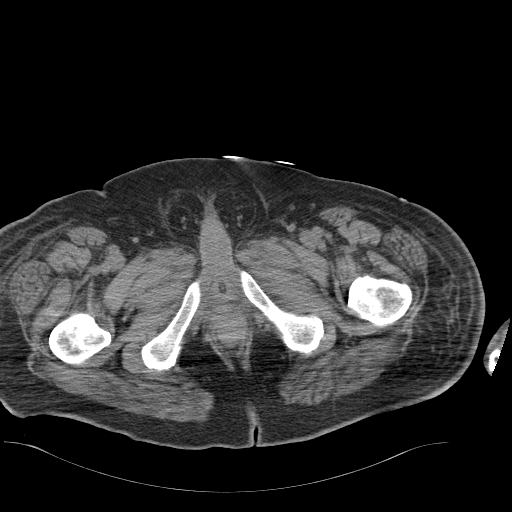
[im 9/110  bone]
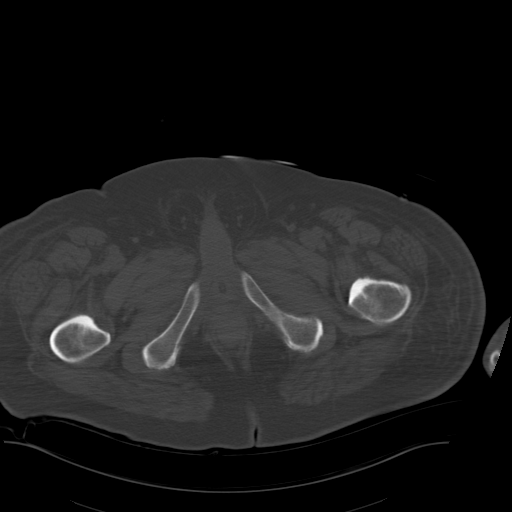
[im 17/110  soft-tissue]
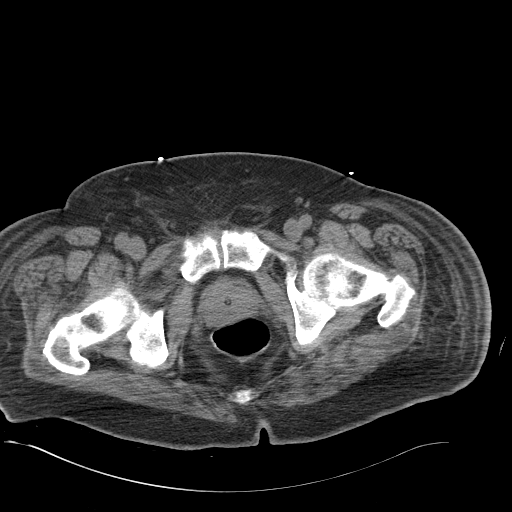
[im 26/110  soft-tissue]
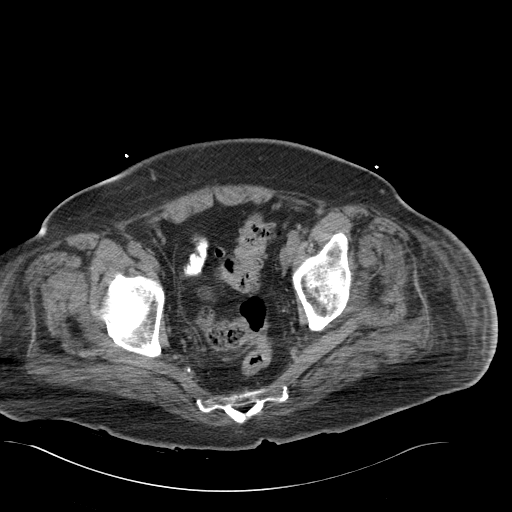
[im 34/110  soft-tissue]
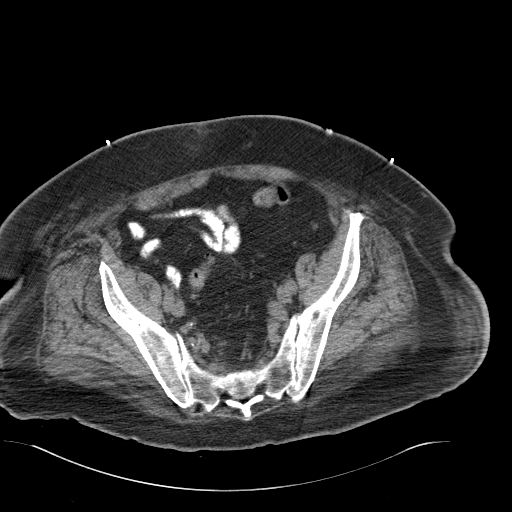
[im 42/110  soft-tissue]
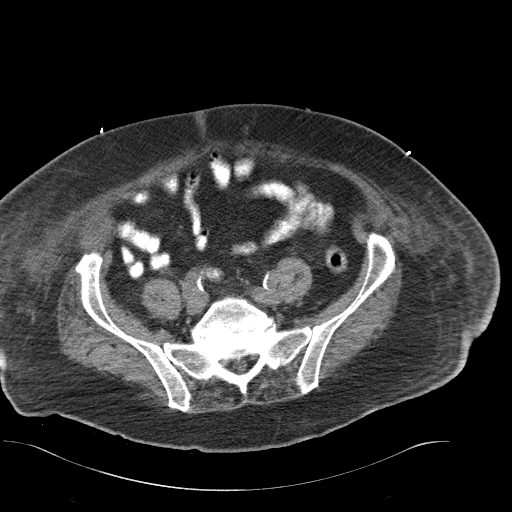
[im 51/110  soft-tissue]
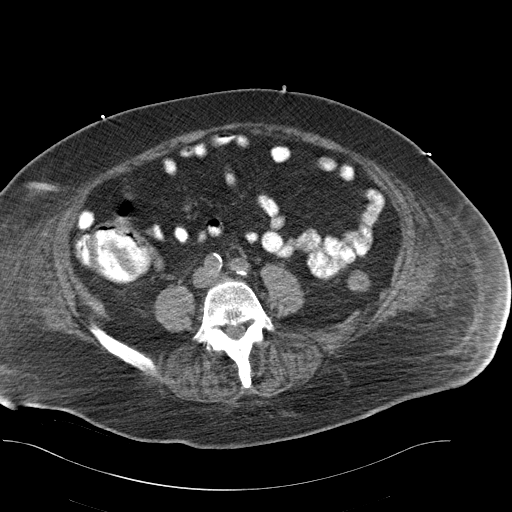
[im 59/110  soft-tissue]
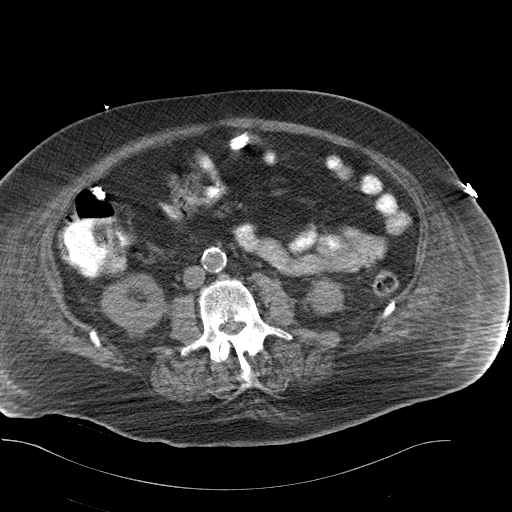
[im 68/110  soft-tissue]
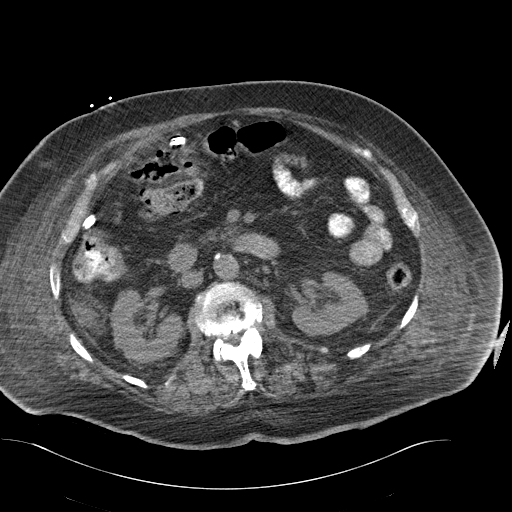
[im 76/110  soft-tissue]
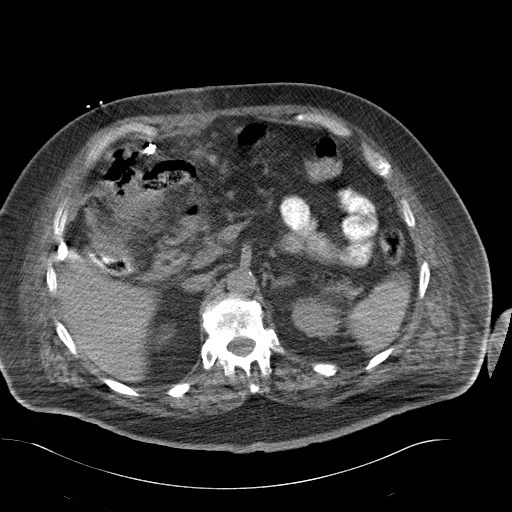
[im 76/110  bone]
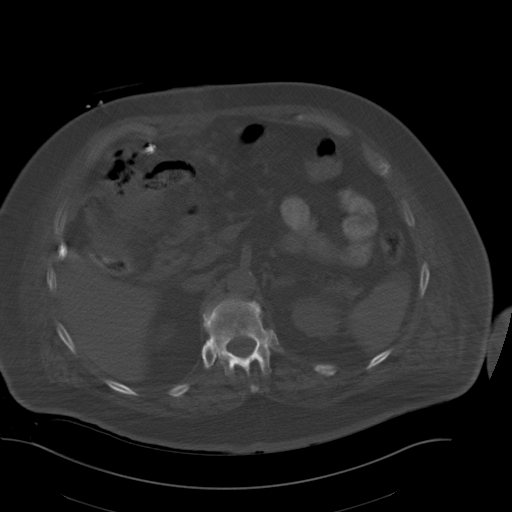
[im 84/110  soft-tissue]
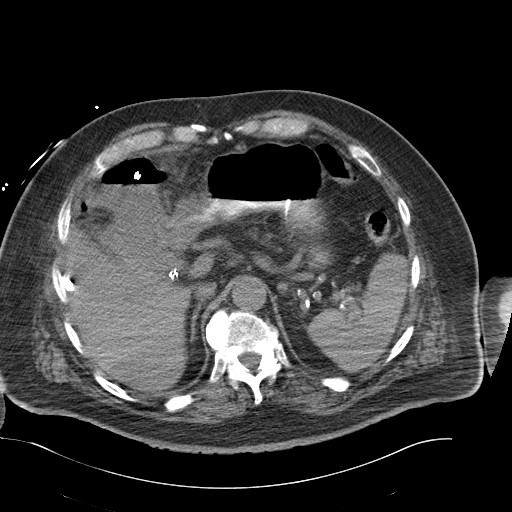
[im 93/110  soft-tissue]
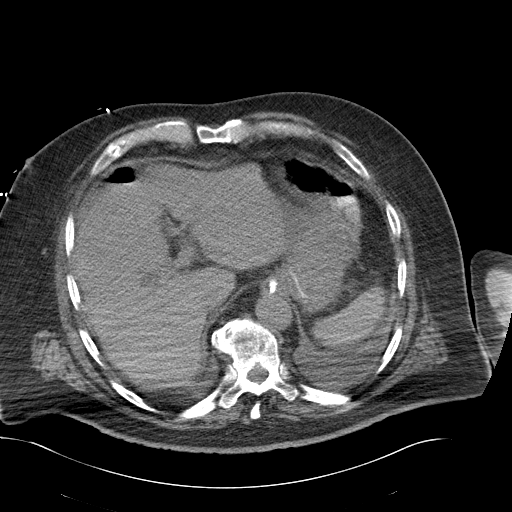
[im 101/110  soft-tissue]
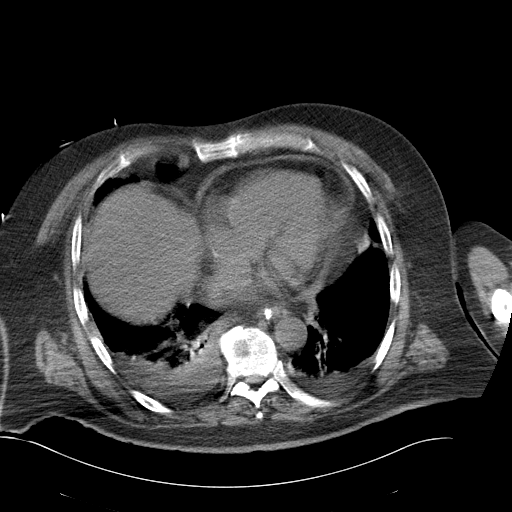

[Series 5: cor routine abd pel wo · coronal · 0.93mm/px · 3 of 149 slices shown]
[im 50/149  soft-tissue]
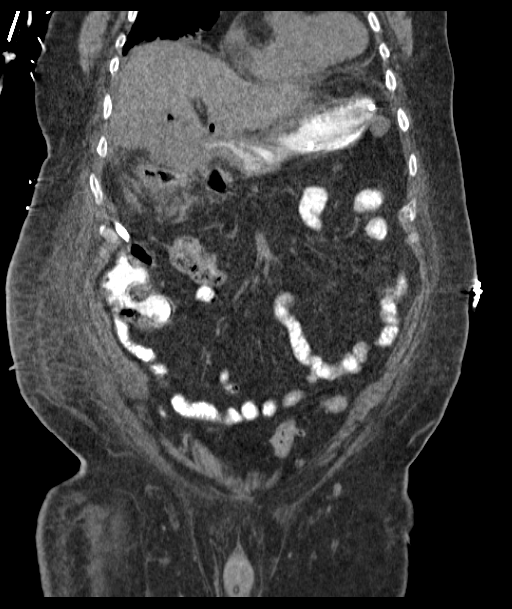
[im 66/149  soft-tissue]
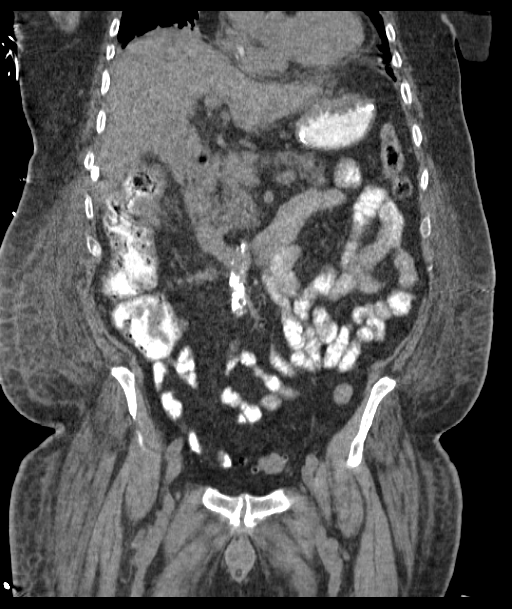
[im 83/149  soft-tissue]
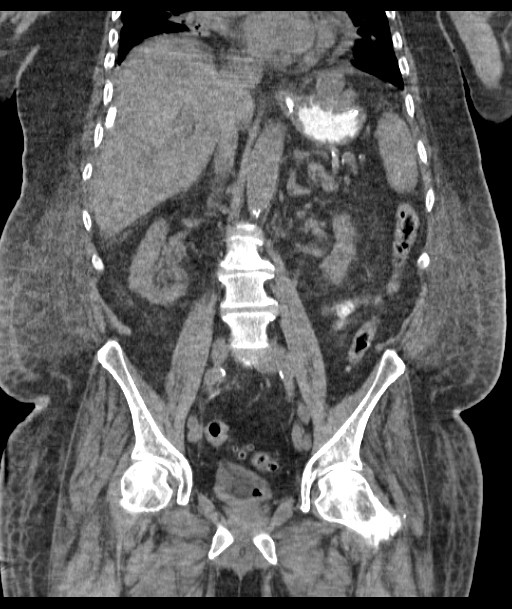

[15 of 46 positions shown; findings below may reference images not displayed]

FINDINGS: Progressive right lower lobe airspace disease, now with small right
pleural effusion. Small left pleural effusion with associated left
basilar airspace disease. Small pericardial effusion versus
physiologic pericardial fluid.

There are 2 surgical drains, drain exiting in the right lower
abdomen, courses in the right pericolic gutter with tip in the
perihepatic space.

The second drain with entrance in the midline, tip courses in the
right upper abdomen, abutting the hepatic flexure of the colon.
There is mottled air and soft tissue density adjacent to the hepatic
flexure of the colon, with question of discontinuity of the colonic
wall, best appreciated on coronal reformat image 41/149. And
multiple foci of extraluminal air in the right upper quadrant and
seen coursing along the course of the drainage catheter. The hepatic
flexure is difficult to define. There is no definite extraluminal
contrast. Along the course of the surgical drain in the right upper
quadrant is a ill-defined air fluid collection, abutting the medial
right lobe of the liver.

Enteric tube within the stomach. No small bowel dilatation. The
unenhanced spleen, pancreas, and adrenal glands are normal. Minimal
hyperdensity in the left renal collecting system, may reflect
delayed renal function on excreted contrast. No obstructive
uropathy. Abdominal aorta is normal in caliber moderate
atherosclerosis. No retroperitoneal adenopathy.

There is no pelvic fluid collection. Multiple colonic diverticula
throughout sigmoid colon. Urinary bladder is decompressed by Foley
catheter.

Diffuse soft tissue stranding of the abdominal wall, more confluent
in both flanks.

Stable degenerative change in the lumbar spine and spondylolysis at
L5-S1.
IMPRESSION: 1. Significant inflammatory change in the right upper quadrant of
the abdomen, along the course of the surgical drain. Multiple foci
of extraluminal air and inflammation in concern for right upper
quadrant phlegmon. There is questionable discontinuity of the
hepatic flexure of the colon, concerning for colonic perforation,
the of the wall of the hepatic flexure is difficult to define.
2. Small bilateral pleural effusions and progressive lower lobe
airspace disease.
These results were called by telephone at the time of interpretation
on 05/25/2014 at [DATE] to [REDACTED] caring for the patient, who
verbally acknowledged these results and will relay them to the
referring clinician.

## 2016-08-29 IMAGING — CR DG ABDOMEN 1V
1 series · 2 of 2 positions shown · non-contrast
Comparison: 05/24/2014.

CLINICAL DATA: Soft feeding tube placement.

EXAM:
ABDOMEN - 1 VIEW

[Series 1: ap · 0.17mm/px · 2 of 2 slices shown]
[im 1/2]
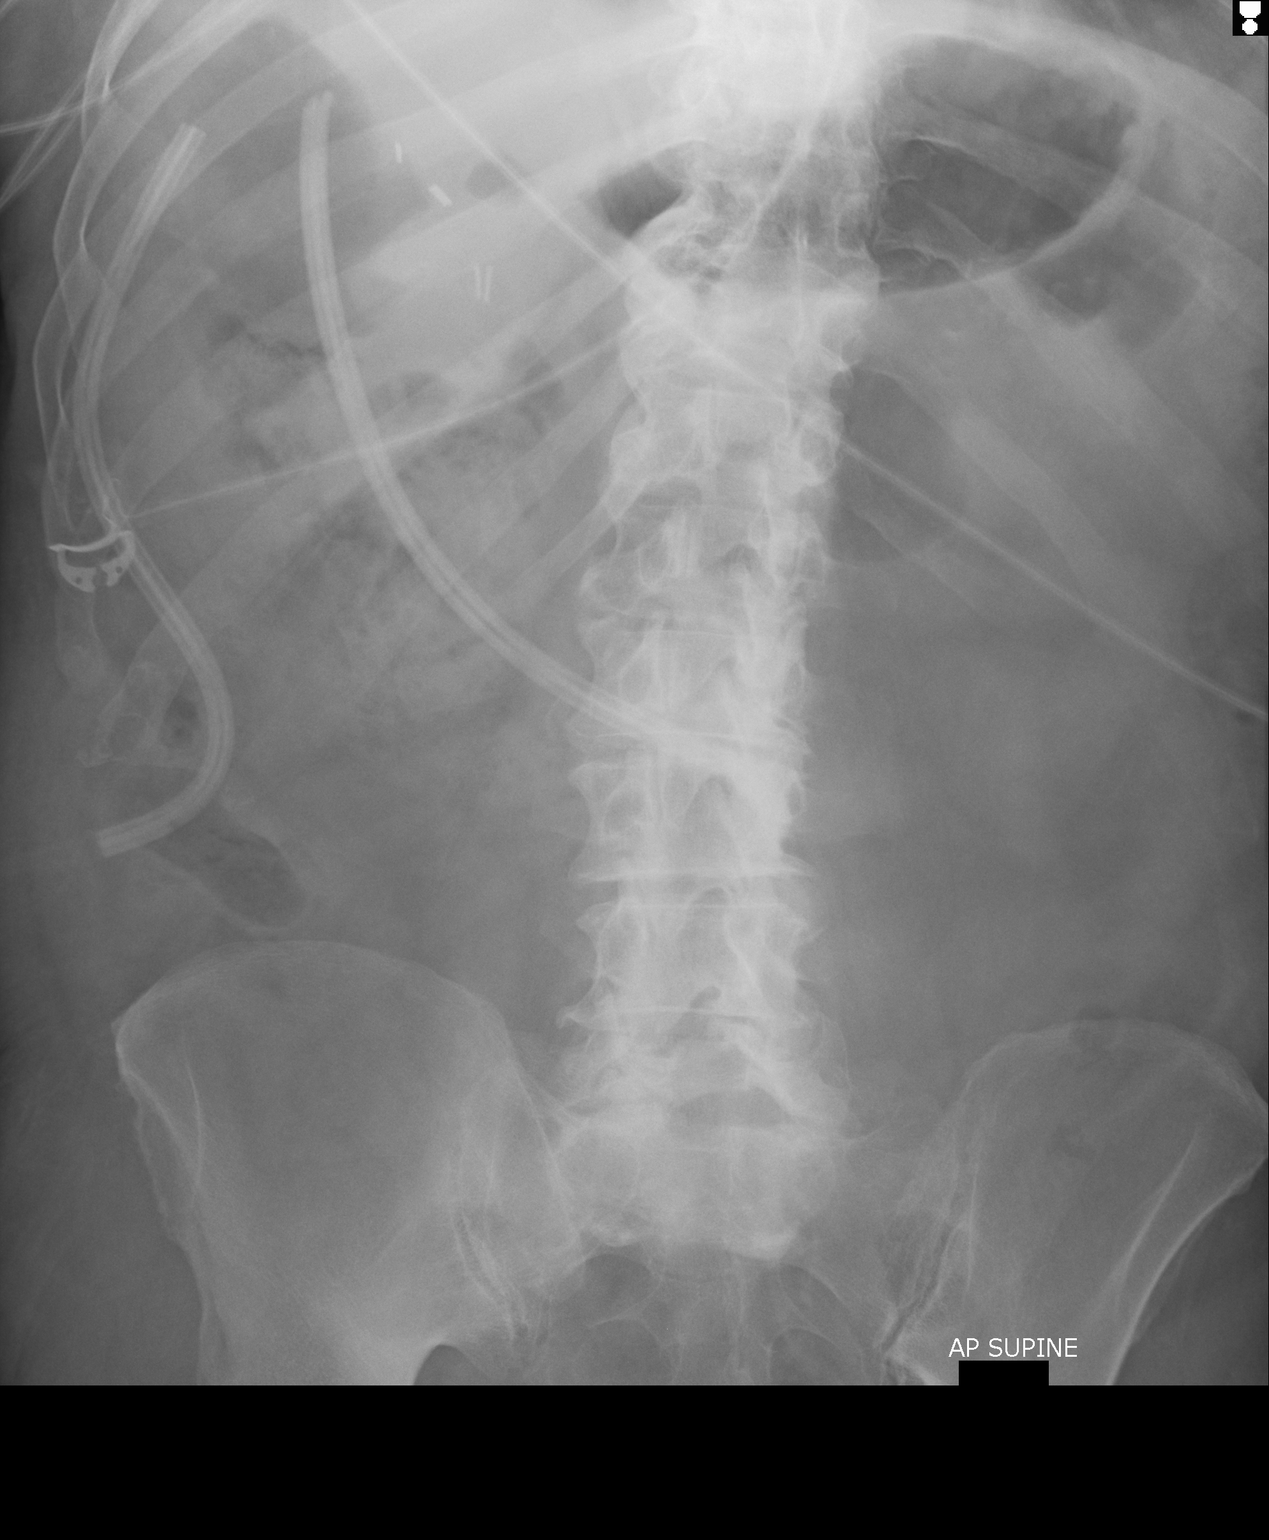
[im 2/2]
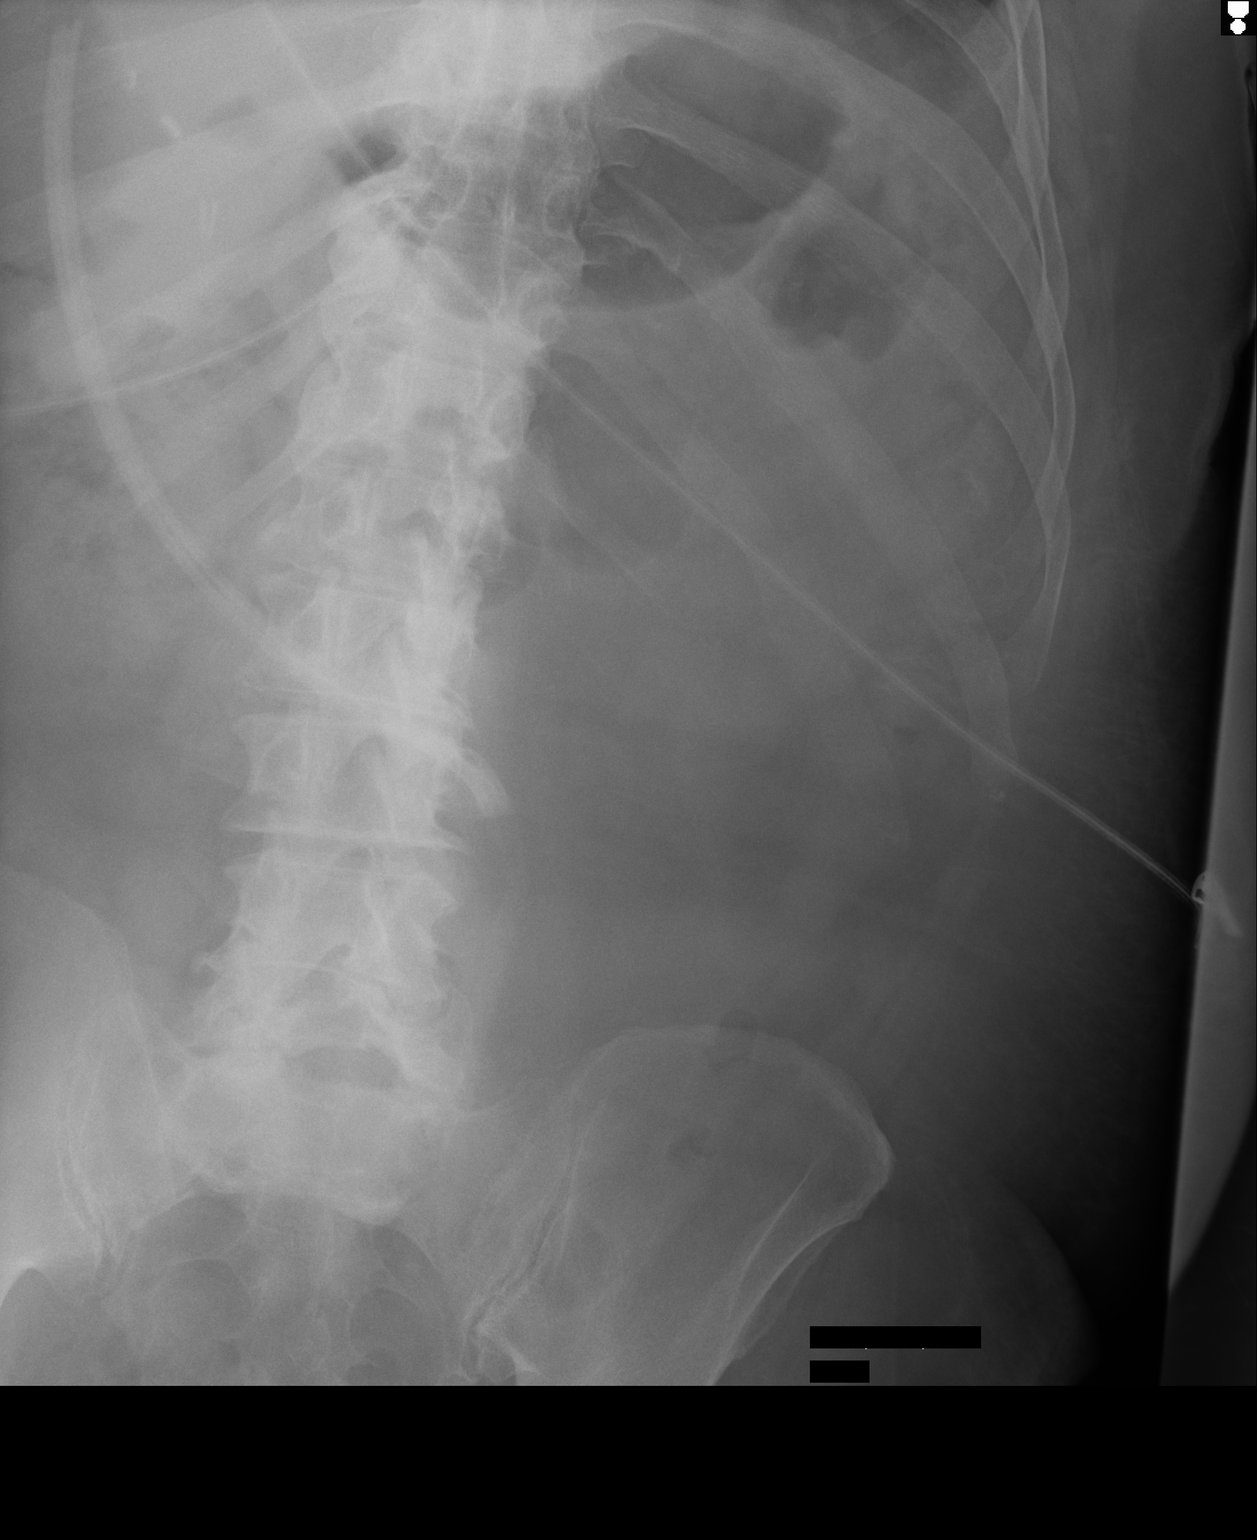

[2 of 2 positions shown; findings below may reference images not displayed]

FINDINGS: Two views of the abdomen do not include the diaphragm but include
most of the stomach. No soft feeding tube is evident in the region
image. Therefore, the tip is either at the gastroesophageal junction
or above and would not be satisfactorily positioned for use.
Surgical drains are present in the right upper quadrant.
IMPRESSION: Soft feeding tube not satisfactory positioned for use. The tip must
be at the gastroesophageal junction or above, as it is not visible
on these radiographs.

These results will be called to the ordering clinician or
representative by the Radiologist Assistant, and communication
documented in the PACS or zVision Dashboard.

## 2016-08-29 IMAGING — CR DG ABDOMEN 1V
1 series · 2 of 2 positions shown · non-contrast
Comparison: Earlier film of the same day

CLINICAL DATA: Nasogastric tube placement

EXAM:
ABDOMEN - 1 VIEW

[Series 1: ap · 0.17mm/px · 2 of 2 slices shown]
[im 1/2]
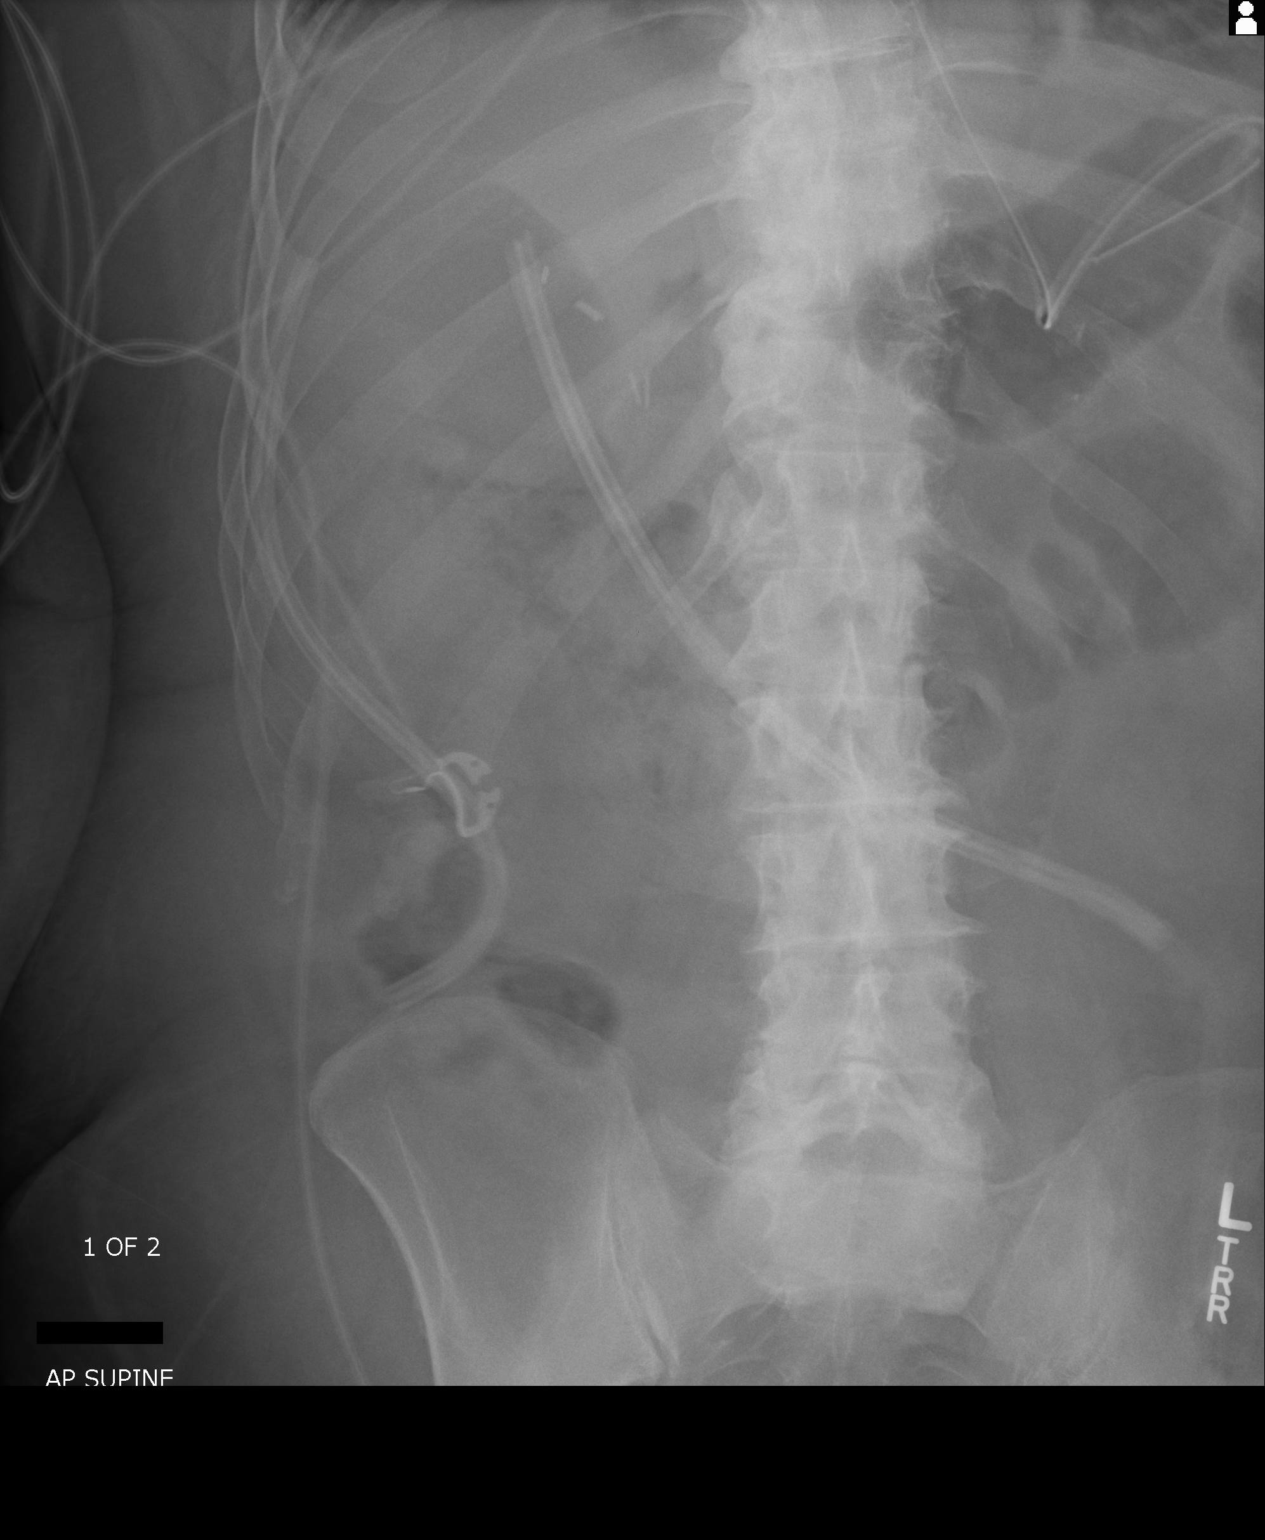
[im 2/2]
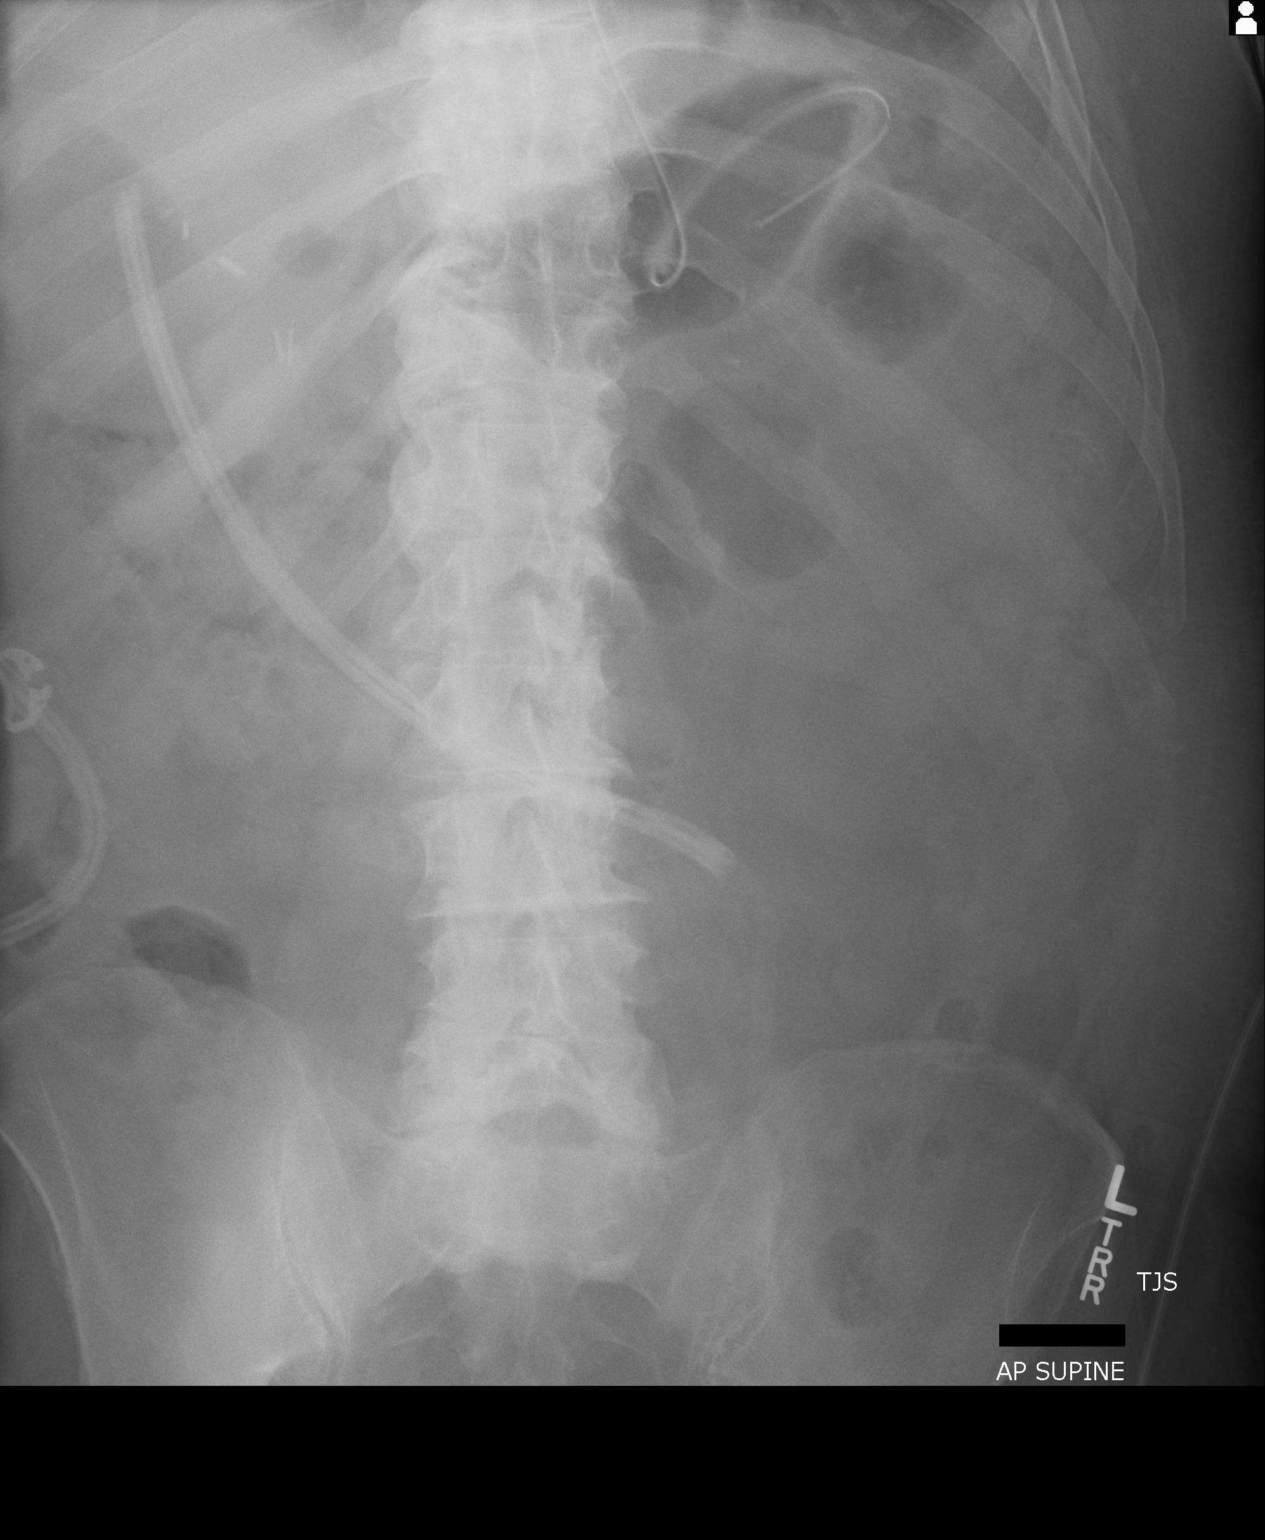

[2 of 2 positions shown; findings below may reference images not displayed]

FINDINGS: Nasogastric tube loops in the gastric fundus. Stomach is
nondistended. Two surgical drains in the right upper abdomen.
Surgical clips in the gallbladder fossa.

Normal bowel gas pattern.

Spondylitic changes in the lower thoracic and lumbar spine.
IMPRESSION: 1. Nasogastric tube placement to the gastric fundus.

## 2017-03-04 DIAGNOSIS — E7800 Pure hypercholesterolemia, unspecified: Secondary | ICD-10-CM | POA: Diagnosis not present

## 2017-03-04 DIAGNOSIS — E1122 Type 2 diabetes mellitus with diabetic chronic kidney disease: Secondary | ICD-10-CM | POA: Diagnosis not present

## 2017-03-04 DIAGNOSIS — N183 Chronic kidney disease, stage 3 (moderate): Secondary | ICD-10-CM | POA: Diagnosis not present

## 2017-03-04 DIAGNOSIS — Z79899 Other long term (current) drug therapy: Secondary | ICD-10-CM | POA: Diagnosis not present

## 2017-03-11 DIAGNOSIS — G500 Trigeminal neuralgia: Secondary | ICD-10-CM | POA: Diagnosis not present

## 2017-03-11 DIAGNOSIS — E1122 Type 2 diabetes mellitus with diabetic chronic kidney disease: Secondary | ICD-10-CM | POA: Diagnosis not present

## 2017-03-11 DIAGNOSIS — K219 Gastro-esophageal reflux disease without esophagitis: Secondary | ICD-10-CM | POA: Diagnosis not present

## 2017-03-11 DIAGNOSIS — I10 Essential (primary) hypertension: Secondary | ICD-10-CM | POA: Diagnosis not present

## 2017-03-11 DIAGNOSIS — Z79899 Other long term (current) drug therapy: Secondary | ICD-10-CM | POA: Diagnosis not present

## 2017-03-11 DIAGNOSIS — E7800 Pure hypercholesterolemia, unspecified: Secondary | ICD-10-CM | POA: Diagnosis not present

## 2017-03-11 DIAGNOSIS — N183 Chronic kidney disease, stage 3 (moderate): Secondary | ICD-10-CM | POA: Diagnosis not present

## 2017-09-02 DIAGNOSIS — E1122 Type 2 diabetes mellitus with diabetic chronic kidney disease: Secondary | ICD-10-CM | POA: Diagnosis not present

## 2017-09-02 DIAGNOSIS — Z79899 Other long term (current) drug therapy: Secondary | ICD-10-CM | POA: Diagnosis not present

## 2017-09-02 DIAGNOSIS — N183 Chronic kidney disease, stage 3 (moderate): Secondary | ICD-10-CM | POA: Diagnosis not present

## 2017-09-02 DIAGNOSIS — E7800 Pure hypercholesterolemia, unspecified: Secondary | ICD-10-CM | POA: Diagnosis not present

## 2017-09-09 DIAGNOSIS — Z0000 Encounter for general adult medical examination without abnormal findings: Secondary | ICD-10-CM | POA: Diagnosis not present

## 2017-09-21 ENCOUNTER — Encounter: Payer: Self-pay | Admitting: Emergency Medicine

## 2017-09-21 ENCOUNTER — Emergency Department
Admission: EM | Admit: 2017-09-21 | Discharge: 2017-09-21 | Disposition: A | Discharge status: Home / Self Care | Payer: Medicare HMO | Attending: Emergency Medicine | Admitting: Emergency Medicine

## 2017-09-21 DIAGNOSIS — Y998 Other external cause status: Secondary | ICD-10-CM | POA: Insufficient documentation

## 2017-09-21 DIAGNOSIS — E1122 Type 2 diabetes mellitus with diabetic chronic kidney disease: Secondary | ICD-10-CM | POA: Diagnosis not present

## 2017-09-21 DIAGNOSIS — N5089 Other specified disorders of the male genital organs: Secondary | ICD-10-CM | POA: Diagnosis present

## 2017-09-21 DIAGNOSIS — S30813A Abrasion of scrotum and testes, initial encounter: Secondary | ICD-10-CM | POA: Insufficient documentation

## 2017-09-21 DIAGNOSIS — Z7984 Long term (current) use of oral hypoglycemic drugs: Secondary | ICD-10-CM | POA: Insufficient documentation

## 2017-09-21 DIAGNOSIS — I129 Hypertensive chronic kidney disease with stage 1 through stage 4 chronic kidney disease, or unspecified chronic kidney disease: Secondary | ICD-10-CM | POA: Diagnosis not present

## 2017-09-21 DIAGNOSIS — Z7982 Long term (current) use of aspirin: Secondary | ICD-10-CM | POA: Diagnosis not present

## 2017-09-21 DIAGNOSIS — T148XXA Other injury of unspecified body region, initial encounter: Secondary | ICD-10-CM

## 2017-09-21 DIAGNOSIS — Y9389 Activity, other specified: Secondary | ICD-10-CM | POA: Insufficient documentation

## 2017-09-21 DIAGNOSIS — N183 Chronic kidney disease, stage 3 (moderate): Secondary | ICD-10-CM | POA: Insufficient documentation

## 2017-09-21 DIAGNOSIS — R58 Hemorrhage, not elsewhere classified: Secondary | ICD-10-CM

## 2017-09-21 DIAGNOSIS — Z79899 Other long term (current) drug therapy: Secondary | ICD-10-CM | POA: Insufficient documentation

## 2017-09-21 DIAGNOSIS — Y33XXXA Other specified events, undetermined intent, initial encounter: Secondary | ICD-10-CM | POA: Insufficient documentation

## 2017-09-21 DIAGNOSIS — N501 Vascular disorders of male genital organs: Secondary | ICD-10-CM | POA: Diagnosis not present

## 2017-09-21 DIAGNOSIS — Y92002 Bathroom of unspecified non-institutional (private) residence single-family (private) house as the place of occurrence of the external cause: Secondary | ICD-10-CM | POA: Diagnosis not present

## 2017-09-21 NOTE — ED Triage Notes (Signed)
Patient presents to the ED with "bleeding from my testicle sac"  Patient reports approx. 30 min prior to arrival, patient had a small bowel movement and noticed blood in the toilet.  Patient states he called his wife in to look and she noticed that patient's testicles were bleeding.  She states, "It looked there was a crack or a hole in it or something."  Wife states blood was not coming from rectum.  Patient states, "it was a lot of fresh blood".  Patient denies any pain.  Patient is in no obvious distress at this time.

## 2017-09-21 NOTE — ED Provider Notes (Signed)
[facility] Emergency Department Provider Note  ____________________________________________  Time seen: Approximately 8:33 AM  I have reviewed the triage vital signs and the nursing notes.   HISTORY  Chief Complaint Testicle Pain (Patient denies pain-reports bleeding)   HPI Jack White is a 77 y.o. male who presents for evaluation of bleeding from his scrotum.  Patient reports that he was getting ready to go to church.  He sat on the toilet to have a small bowel movement and noticed blood in the toilet.  When his wife looked she saw a bleeding coming from his scrotum.  Patient denies melena.  There is no testicular pain, no abdominal pain, no dizziness, no trauma to the testicles/scrotum.  Patient is not on blood thinners.  No dizziness or chest pain or shortness of breath.  The bleeding was sudden and started 30 minutes prior to arrival, the bleeding is ongoing.  Past Medical History:  Diagnosis Date  . Acute cholecystitis 05/21/14  . BP (high blood pressure) 10/14/2014  . Chronic kidney disease (CKD), stage III (moderate) (HCC) 02/15/2014  . Colon perforation (HCC) 05/28/2014  . Diabetes mellitus without complication (HCC)   . 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 Headache   . Hypertension   . 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    Patient Active Problem List   Diagnosis Date Noted  . 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  . S/P colostomy (HCC)   . Acute cholecystitis 10/14/2014  . 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 Colon perforation (HCC) 10/14/2014  . H/O colostomy 10/14/2014  . Diabetes mellitus, type 2 (HCC) 10/14/2014  . BP (high blood pressure) 10/14/2014  . 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 Gangrenous cholecystitis 06/27/2014  . Chronic kidney disease (CKD), stage III (moderate) (HCC) 02/15/2014  . Fungal infection of toenail 07/30/2013    Past  Surgical History:  Procedure Laterality Date  . COLOSTOMY  05/28/2014   Dr. Anda Kraft  . COLOSTOMY TAKEDOWN N/A 10/26/2014   Procedure: COLOSTOMY TAKEDOWN;  Surgeon: Tiney Rouge III, MD;  Location: [facility] ORS;  Service: General;  Laterality: N/A;  . EXPLORATORY LAPAROTOMY  05/28/2014   Dr. Anda Kraft  . LAPAROSCOPIC CHOLECYSTECTOMY  05/21/2014   Dr. Michela Pitcher    Prior to Admission medications   Medication Sig Start Date End Date Taking? Authorizing Provider  aspirin 81 MG tablet Take 81 mg by mouth daily.    [provider]  atorvastatin (LIPITOR) 10 MG tablet  10/03/14   [provider]  celecoxib (CELEBREX) 200 MG capsule Take 200 mg by mouth 2 (two) times daily.    [provider]  doxazosin (CARDURA) 4 MG tablet Take 1 tablet by mouth daily. 10/17/14   [provider]  gabapentin (NEURONTIN) 300 MG capsule Take 2 capsules by mouth 3 (three) times daily. 08/17/14   [provider]  hydrocortisone cream 1 % Apply topically 3 (three) times daily. 06/26/14   Natale Lay, MD  hydrOXYzine (ATARAX/VISTARIL) 10 MG tablet Take 1 tablet (10 mg total) by mouth every 6 (six) hours as needed for itching. 06/26/14   Natale Lay, MD  lisinopril (PRINIVIL,ZESTRIL) 2.5 MG tablet 2.5 mg daily.  10/12/14   [provider]  Melatonin 10 MG CAPS Take 1 capsule by mouth at bedtime.    [provider]  metFORMIN (GLUCOPHAGE) 1000 MG tablet Take 1,000 mg by mouth every morning.    [provider]  metoprolol (LOPRESSOR)  50 MG tablet Take 1 tablet by mouth 2 (two) times daily. 10/17/14   [provider]  metroNIDAZOLE (FLAGYL) 500 MG tablet Take 1 tablet (500 mg total) by mouth 3 (three) times daily. 11/09/14   Salley HewsEly, Ralph III, MD  omeprazole (PRILOSEC) 20 MG capsule Take 1 capsule by mouth daily. 08/17/14   [provider]  zolpidem (AMBIEN) 5 MG tablet Take 0.5 tablets (2.5 mg total) by mouth at bedtime as needed for sleep. 11/03/14   Ida RogueLundquist,  Christopher, MD    Allergies Dilaudid [hydromorphone hcl] and Tomato  Family History  Problem Relation Age of Onset  . Diabetes Mother   . Hypertension Mother   . Heart disease Father     Social History Social History   Tobacco Use  . Smoking status: Never Smoker  . Smokeless tobacco: Never Used  Substance Use Topics  . Alcohol use: No    Alcohol/week: 0.0 oz  . Drug use: No    Review of Systems  Constitutional: Negative for fever. Eyes: Negative for visual changes. ENT: Negative for sore throat. Neck: No neck pain  Cardiovascular: Negative for chest pain. Respiratory: Negative for shortness of breath. Gastrointestinal: Negative for abdominal pain, vomiting or diarrhea. Genitourinary: Negative for dysuria. + bleeding from testicle Musculoskeletal: Negative for back pain. Skin: Negative for rash. Neurological: Negative for headaches, weakness or numbness. Psych: No SI or HI  ____________________________________________   PHYSICAL EXAM:  VITAL SIGNS: ED Triage Vitals  Enc Vitals Group     BP 09/21/17 0804 135/62     Pulse Rate 09/21/17 0804 75     Resp 09/21/17 0804 18     Temp 09/21/17 0804 98.2 F (36.8 C)     Temp Source 09/21/17 0804 Oral     SpO2 09/21/17 0804 96 %     Weight 09/21/17 0805 240 lb (108.9 kg)     Height 09/21/17 0805 6' (1.829 m)     Head Circumference --      Peak Flow --      Pain Score 09/21/17 0805 0     Pain Loc --      Pain Edu? --      Excl. in GC? --     Constitutional: Alert and oriented. Well appearing and in no apparent distress. HEENT:      Head: Normocephalic and atraumatic.         Eyes: Conjunctivae are normal. Sclera is non-icteric.       Mouth/Throat: Mucous membranes are moist.       Neck: Supple with no signs of meningismus. Cardiovascular: Regular rate and rhythm. No murmurs, gallops, or rubs. 2+ symmetrical distal pulses are present in all extremities. No JVD. Respiratory: Normal respiratory effort. Lungs  are clear to auscultation bilaterally. No wheezes, crackles, or rhonchi.  Gastrointestinal: Soft, non tender, and non distended with positive bowel sounds. No rebound or guarding. Genitourinary: There is a small abrasion to the L scrotum that is oozing blood. Bilateral testicles are descended with no tenderness to palpation, bilateral positive cremasteric reflexes are present, no swelling or erythema of the scrotum. No evidence of inguinal hernia. Neurologic: Normal speech and language. Face is symmetric. Moving all extremities. No gross focal neurologic deficits are appreciated. Skin: Skin is warm, dry and intact. No rash noted. Psychiatric: Mood and affect are normal. Speech and behavior are normal.  ____________________________________________   LABS (all labs ordered are listed, but only abnormal results are displayed)  Labs Reviewed - No data to display  ____________________________________________  EKG  none  ____________________________________________  RADIOLOGY  none  ____________________________________________   PROCEDURES  Procedure(s) performed: None Procedures Critical Care performed:  None ____________________________________________   INITIAL IMPRESSION / ASSESSMENT AND PLAN / ED COURSE   77 y.o. male who presents for evaluation of bleeding from his scrotum.  Patient has a very small abrasion/excoriation of the left testicle that is oozing blood.  This is the source of the bleeding.  Patient is otherwise hemodynamically stable.  He is not on blood thinners.  Pressure was applied to the region for 20 minutes with resolution of the bleeding.  Patient be discharged home at this time.  Recommended applying pressure if the bleeding resumes.  Discussed follow-up with primary care doctor.      As part of my medical decision making, I reviewed the following data within the electronic MEDICAL RECORD NUMBER Nursing notes reviewed and incorporated, Notes from prior ED visits  and [place] Controlled Substance Database    Pertinent labs & imaging results that were available during my care of the patient were reviewed by me and considered in my medical decision making (see chart for details).    ____________________________________________   FINAL CLINICAL IMPRESSION(S) / ED DIAGNOSES  Final diagnoses:  Skin abrasion  Bleeding      NEW MEDICATIONS STARTED DURING THIS VISIT:  ED Discharge Orders    None       Note:  This document was prepared using Dragon voice recognition software and may include unintentional dictation errors.    Don Perking, [place], MD 09/21/17 [phone]

## 2017-09-21 NOTE — ED Notes (Signed)
Brief given to pt and pt wife at this time.

## 2017-09-21 NOTE — Discharge Instructions (Signed)
If the bleeding recurs, apply pressure for 20 min. If you are unable to stop the bleeding return to the ER or go see your doctor.

## 2017-09-21 NOTE — ED Triage Notes (Signed)
First Nurse Note:  Arrives c/o bleeding from scrotum this am.  Denies injury or pain.  AAOx3.  NAD.  Skin warm and dry.

## 2017-11-26 DIAGNOSIS — Z23 Encounter for immunization: Secondary | ICD-10-CM | POA: Diagnosis not present

## 2017-12-08 ENCOUNTER — Emergency Department: Payer: Medicare HMO

## 2017-12-08 ENCOUNTER — Other Ambulatory Visit: Payer: Self-pay

## 2017-12-08 ENCOUNTER — Emergency Department
Admission: EM | Admit: 2017-12-08 | Discharge: 2017-12-08 | Disposition: A | Discharge status: Home / Self Care | Payer: Medicare HMO | Attending: Emergency Medicine | Admitting: Emergency Medicine

## 2017-12-08 DIAGNOSIS — Z7984 Long term (current) use of oral hypoglycemic drugs: Secondary | ICD-10-CM | POA: Diagnosis not present

## 2017-12-08 DIAGNOSIS — I129 Hypertensive chronic kidney disease with stage 1 through stage 4 chronic kidney disease, or unspecified chronic kidney disease: Secondary | ICD-10-CM | POA: Diagnosis not present

## 2017-12-08 DIAGNOSIS — Y929 Unspecified place or not applicable: Secondary | ICD-10-CM | POA: Diagnosis not present

## 2017-12-08 DIAGNOSIS — R51 Headache: Secondary | ICD-10-CM | POA: Diagnosis not present

## 2017-12-08 DIAGNOSIS — W01198A Fall on same level from slipping, tripping and stumbling with subsequent striking against other object, initial encounter: Secondary | ICD-10-CM | POA: Diagnosis not present

## 2017-12-08 DIAGNOSIS — Y9389 Activity, other specified: Secondary | ICD-10-CM | POA: Insufficient documentation

## 2017-12-08 DIAGNOSIS — N183 Chronic kidney disease, stage 3 (moderate): Secondary | ICD-10-CM | POA: Insufficient documentation

## 2017-12-08 DIAGNOSIS — Y998 Other external cause status: Secondary | ICD-10-CM | POA: Insufficient documentation

## 2017-12-08 DIAGNOSIS — E1122 Type 2 diabetes mellitus with diabetic chronic kidney disease: Secondary | ICD-10-CM | POA: Diagnosis not present

## 2017-12-08 DIAGNOSIS — S0990XA Unspecified injury of head, initial encounter: Secondary | ICD-10-CM | POA: Diagnosis present

## 2017-12-08 DIAGNOSIS — W19XXXA Unspecified fall, initial encounter: Secondary | ICD-10-CM | POA: Diagnosis not present

## 2017-12-08 DIAGNOSIS — Z79899 Other long term (current) drug therapy: Secondary | ICD-10-CM | POA: Insufficient documentation

## 2017-12-08 DIAGNOSIS — Z7982 Long term (current) use of aspirin: Secondary | ICD-10-CM | POA: Insufficient documentation

## 2017-12-08 DIAGNOSIS — S0101XA Laceration without foreign body of scalp, initial encounter: Secondary | ICD-10-CM | POA: Insufficient documentation

## 2017-12-08 MED ORDER — LIDOCAINE-EPINEPHRINE 2 %-1:100000 IJ SOLN
INTRAMUSCULAR | Status: AC
  Administered 2017-12-08: 30 mL via INTRADERMAL
  Filled 2017-12-08: qty 1

## 2017-12-08 MED ORDER — LIDOCAINE-EPINEPHRINE 2 %-1:100000 IJ SOLN
30.0000 mL | Freq: Once | INTRAMUSCULAR | Status: AC
  Administered 2017-12-08: 30 mL via INTRADERMAL

## 2017-12-08 NOTE — ED Provider Notes (Signed)
[facility] Emergency Department Provider Note  ___________________________________________   First MD Initiated Contact with Patient 12/08/17 1136     (approximate)  I have reviewed the triage vital signs and the nursing notes.   HISTORY  Chief Complaint Fall   HPI Jack White is a 77 y.o. male with a history of high blood pressure on aspirin as well as metformin for his diabetes who is presenting to the emergency department after a fall.  He says that he did trip and fall where he fell backwards hitting the back of his head.  He thinks that he may have lost consciousness briefly.  However, at this time he denies any confusion.  Is able to ambulate.  Says that his neck does not hurt.  EMS reported a blood sugar of 142.  Patient says that he had a tetanus shot approximately 1-1/2 years ago.  Presented to the emergency department because of laceration to the occipital parietal region.   Past Medical History:  Diagnosis Date  . Acute cholecystitis 05/21/14  . BP (high blood pressure) 10/14/2014  . Chronic kidney disease (CKD), stage III (moderate) (HCC) 02/15/2014  . Colon perforation (HCC) 05/28/2014  . Diabetes mellitus without complication (HCC)   . 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 Headache   . Hypertension   . 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    Patient Active Problem List   Diagnosis Date Noted  . 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  . S/P colostomy (HCC)   . Acute cholecystitis 10/14/2014  . 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 Colon perforation (HCC) 10/14/2014  . H/O colostomy 10/14/2014  . Diabetes mellitus, type 2 (HCC) 10/14/2014  . BP (high blood pressure) 10/14/2014  . 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 Gangrenous cholecystitis 06/27/2014  . Chronic kidney disease (CKD), stage III (moderate) (HCC) 02/15/2014  . Fungal infection of toenail  07/30/2013    Past Surgical History:  Procedure Laterality Date  . COLOSTOMY  05/28/2014   Dr. Anda Kraft  . COLOSTOMY TAKEDOWN N/A 10/26/2014   Procedure: COLOSTOMY TAKEDOWN;  Surgeon: Tiney Rouge III, MD;  Location: [facility] ORS;  Service: General;  Laterality: N/A;  . EXPLORATORY LAPAROTOMY  05/28/2014   Dr. Anda Kraft  . LAPAROSCOPIC CHOLECYSTECTOMY  05/21/2014   Dr. Michela Pitcher    Prior to Admission medications   Medication Sig Start Date End Date Taking? Authorizing Provider  aspirin 81 MG tablet Take 81 mg by mouth daily.    [provider]  atorvastatin (LIPITOR) 10 MG tablet  10/03/14   [provider]  celecoxib (CELEBREX) 200 MG capsule Take 200 mg by mouth 2 (two) times daily.    [provider]  doxazosin (CARDURA) 4 MG tablet Take 1 tablet by mouth daily. 10/17/14   [provider]  gabapentin (NEURONTIN) 300 MG capsule Take 2 capsules by mouth 3 (three) times daily. 08/17/14   [provider]  hydrocortisone cream 1 % Apply topically 3 (three) times daily. 06/26/14   Natale Lay, MD  hydrOXYzine (ATARAX/VISTARIL) 10 MG tablet Take 1 tablet (10 mg total) by mouth every 6 (six) hours as needed for itching. 06/26/14   Natale Lay, MD  lisinopril (PRINIVIL,ZESTRIL) 2.5 MG tablet 2.5 mg daily.  10/12/14   [provider]  Melatonin 10 MG CAPS Take 1 capsule by mouth at bedtime.    [provider]  metFORMIN (GLUCOPHAGE) 1000 MG tablet Take 1,000 mg by  mouth every morning.    [provider]  metoprolol (LOPRESSOR) 50 MG tablet Take 1 tablet by mouth 2 (two) times daily. 10/17/14   [provider]  metroNIDAZOLE (FLAGYL) 500 MG tablet Take 1 tablet (500 mg total) by mouth 3 (three) times daily. 11/09/14   Salley Hews, MD  omeprazole (PRILOSEC) 20 MG capsule Take 1 capsule by mouth daily. 08/17/14   [provider]  zolpidem (AMBIEN) 5 MG tablet Take 0.5 tablets (2.5 mg total) by mouth at bedtime as needed for sleep. 11/03/14    Ida Rogue, MD    Allergies Citrus; Dilaudid [hydromorphone hcl]; and Tomato  Family History  Problem Relation Age of Onset  . Diabetes Mother   . Hypertension Mother   . Heart disease Father     Social History Social History   Tobacco Use  . Smoking status: Never Smoker  . Smokeless tobacco: Never Used  Substance Use Topics  . Alcohol use: No    Alcohol/week: 0.0 standard drinks  . Drug use: No    Review of Systems  Constitutional: No fever/chills Eyes: No visual changes. ENT: No sore throat. Cardiovascular: Denies chest pain. Respiratory: Denies shortness of breath. Gastrointestinal: No abdominal pain.  No nausea, no vomiting.  No diarrhea.  No constipation. Genitourinary: Negative for dysuria. Musculoskeletal: Negative for back pain. Skin: Negative for rash. Neurological: Negative for focal weakness or numbness.   ____________________________________________   PHYSICAL EXAM:  VITAL SIGNS: ED Triage Vitals  Enc Vitals Group     BP 12/08/17 1135 131/81     Pulse Rate 12/08/17 1135 73     Resp 12/08/17 1135 18     Temp 12/08/17 1135 98.4 F (36.9 C)     Temp Source 12/08/17 1135 Oral     SpO2 12/08/17 1135 94 %     Weight 12/08/17 1135 230 lb (104.3 kg)     Height 12/08/17 1135 6' (1.829 m)     Head Circumference --      Peak Flow --      Pain Score 12/08/17 1135 6     Pain Loc --      Pain Edu? --      Excl. in GC? --     Constitutional: Alert and oriented. Well appearing and in no acute distress.  Patient ambulated from the ambulance stretcher to the hospital bed. Eyes: Conjunctivae are normal.  Head: 3 cm laceration with hematoma to the occipital parietal region on the left side.  No bogginess.  Minimal tenderness to palpation. Nose: No congestion/rhinnorhea. Mouth/Throat: Mucous membranes are moist.  Neck: No stridor.  No tenderness palpation of the midline cervical spine.  No deformity or step-off. Cardiovascular: Normal rate,  regular rhythm. Grossly normal heart sounds.  Respiratory: Normal respiratory effort.  No retractions. Lungs CTAB. Gastrointestinal: Soft and nontender. No distention.  Musculoskeletal: No lower extremity tenderness nor edema.  No joint effusions. Neurologic:  Normal speech and language. No gross focal neurologic deficits are appreciated. Skin:  Skin is warm, dry and intact. No rash noted. Psychiatric: Mood and affect are normal. Speech and behavior are normal.  ____________________________________________   LABS (all labs ordered are listed, but only abnormal results are displayed)  Labs Reviewed - No data to display ____________________________________________  EKG   ____________________________________________  RADIOLOGY   CT head without evidence of intracranial injury.  Scalp laceration without fracture evident. ____________________________________________   PROCEDURES  Procedure(s) performed:   Marland KitchenMarland KitchenLaceration Repair Date/Time: 12/08/2017 1:23 PM Performed by: Pershing Proud,  Myra Rude, MD Authorized by: Myrna Blazer, MD   Consent:    Consent obtained:  Verbal   Consent given by:  Patient   Risks discussed:  Infection, pain, retained foreign body, poor cosmetic result and poor wound healing Anesthesia (see MAR for exact dosages):    Anesthesia method:  Local infiltration   Local anesthetic:  Lidocaine 1% WITH epi Laceration details:    Location:  Scalp   Scalp location:  L parietal   Length (cm):  6   Depth (mm):  4 Repair type:    Repair type:  Simple Exploration:    Hemostasis achieved with:  Direct pressure   Wound exploration: entire depth of wound probed and visualized     Contaminated: no   Treatment:    Area cleansed with:  Saline   Amount of cleaning:  Extensive   Irrigation solution:  Sterile saline   Irrigation method:  Syringe and pressure wash   Visualized foreign bodies/material removed: no   Skin repair:    Repair method:   Staples   Number of staples:  6 Approximation:    Approximation:  Close Post-procedure details:    Dressing:  Sterile dressing   Patient tolerance of procedure:  Tolerated well, no immediate complications Comments:     Patient wound cleaned with saline to reveal a stellate and macerated but clean wound.  Multiple staples with good closure and hemostasis achieved.  Patient tolerated the procedure well.    Critical Care performed:   ____________________________________________   INITIAL IMPRESSION / ASSESSMENT AND PLAN / ED COURSE  Pertinent labs & imaging results that were available during my care of the patient were reviewed by me and considered in my medical decision making (see chart for details).  DDX: Contusion, laceration, cephalohematoma, head injury, intrarenal hemorrhage, skull fracture As part of my medical decision making, I reviewed the following data within the electronic MEDICAL RECORD NUMBER Notes from prior ED visits  ----------------------------------------- 1:24 PM on 12/08/2017 -----------------------------------------  Patient aware of CT results as well as necessity of staples.  Patient knows to keep the wound dry for 24 hours and then may wash normally.  Staples come out in 7 days.  Patient be discharged at this time. ____________________________________________   FINAL CLINICAL IMPRESSION(S) / ED DIAGNOSES  Mechanical fall.  Scalp laceration.  NEW MEDICATIONS STARTED DURING THIS VISIT:  New Prescriptions   No medications on file     Note:  This document was prepared using Dragon voice recognition software and may include unintentional dictation errors.     Myrna Blazer, MD 12/08/17 1325

## 2017-12-08 NOTE — ED Triage Notes (Addendum)
Pt to ER via [facility] from work after trip and fall onto asphalt. Laceration to posterior head. Denies LOC. Denies neck or back pain. Pt alert and oriented X4, active, cooperative, pt in NAD. RR even and unlabored, color WNL.  Bleeding controlled.CBG 149

## 2017-12-08 NOTE — ED Notes (Addendum)
EDP at bedside to clean and close laceration.  6 staples to close laceration.

## 2017-12-08 NOTE — ED Notes (Signed)
Pt presents to ED with laceration to posterior scalp s/p fall. 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 He reports that the next thing he remembers was the EMTs working on him. He denies memory of the fall. Pt denies any previous Hx of syncope, falls, or LoC.

## 2017-12-08 NOTE — ED Notes (Signed)
Pt alert and oriented X4, active, cooperative, pt in NAD. RR even and unlabored, color WNL.  Pt informed to return if any life threatening symptoms occur.  Discharge and followup instructions reviewed.  

## 2017-12-16 DIAGNOSIS — S0101XD Laceration without foreign body of scalp, subsequent encounter: Secondary | ICD-10-CM | POA: Diagnosis not present

## 2018-01-06 DIAGNOSIS — Z79899 Other long term (current) drug therapy: Secondary | ICD-10-CM | POA: Diagnosis not present

## 2018-01-06 DIAGNOSIS — E1122 Type 2 diabetes mellitus with diabetic chronic kidney disease: Secondary | ICD-10-CM | POA: Diagnosis not present

## 2018-01-06 DIAGNOSIS — N183 Chronic kidney disease, stage 3 (moderate): Secondary | ICD-10-CM | POA: Diagnosis not present

## 2018-01-12 DIAGNOSIS — J209 Acute bronchitis, unspecified: Secondary | ICD-10-CM | POA: Diagnosis not present

## 2018-01-27 DIAGNOSIS — Z125 Encounter for screening for malignant neoplasm of prostate: Secondary | ICD-10-CM | POA: Diagnosis not present

## 2018-01-27 DIAGNOSIS — E7800 Pure hypercholesterolemia, unspecified: Secondary | ICD-10-CM | POA: Diagnosis not present

## 2018-01-27 DIAGNOSIS — K219 Gastro-esophageal reflux disease without esophagitis: Secondary | ICD-10-CM | POA: Diagnosis not present

## 2018-01-27 DIAGNOSIS — N401 Enlarged prostate with lower urinary tract symptoms: Secondary | ICD-10-CM | POA: Diagnosis not present

## 2018-01-27 DIAGNOSIS — I10 Essential (primary) hypertension: Secondary | ICD-10-CM | POA: Diagnosis not present

## 2018-01-27 DIAGNOSIS — Z79899 Other long term (current) drug therapy: Secondary | ICD-10-CM | POA: Diagnosis not present

## 2018-01-27 DIAGNOSIS — E1122 Type 2 diabetes mellitus with diabetic chronic kidney disease: Secondary | ICD-10-CM | POA: Diagnosis not present

## 2018-01-27 DIAGNOSIS — N183 Chronic kidney disease, stage 3 (moderate): Secondary | ICD-10-CM | POA: Diagnosis not present

## 2018-01-27 DIAGNOSIS — R351 Nocturia: Secondary | ICD-10-CM | POA: Diagnosis not present

## 2021-09-20 ENCOUNTER — Ambulatory Visit: Payer: Medicare Other | Attending: Family Medicine

## 2021-09-20 DIAGNOSIS — R42 Dizziness and giddiness: Secondary | ICD-10-CM | POA: Diagnosis not present
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
## 2021-09-26 ENCOUNTER — Ambulatory Visit: Payer: Medicare Other | Attending: Family Medicine

## 2021-09-26 DIAGNOSIS — M6281 Muscle weakness (generalized): Secondary | ICD-10-CM | POA: Insufficient documentation

## 2021-09-26 DIAGNOSIS — R42 Dizziness and giddiness: Secondary | ICD-10-CM | POA: Diagnosis present

## 2021-09-28 ENCOUNTER — Ambulatory Visit: Payer: Medicare Other

## 2021-09-28 DIAGNOSIS — R42 Dizziness and giddiness: Secondary | ICD-10-CM | POA: Diagnosis not present
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
## 2021-10-03 ENCOUNTER — Ambulatory Visit: Payer: Medicare Other

## 2021-10-03 DIAGNOSIS — R42 Dizziness and giddiness: Secondary | ICD-10-CM
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
## 2021-10-05 ENCOUNTER — Ambulatory Visit: Payer: Medicare Other

## 2021-10-05 DIAGNOSIS — M6281 Muscle weakness (generalized): Secondary | ICD-10-CM

## 2021-10-05 DIAGNOSIS — R42 Dizziness and giddiness: Secondary | ICD-10-CM | POA: Diagnosis not present
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
## 2021-10-10 ENCOUNTER — Ambulatory Visit: Payer: Medicare Other

## 2021-10-10 DIAGNOSIS — M6281 Muscle weakness (generalized): Secondary | ICD-10-CM

## 2021-10-10 DIAGNOSIS — R42 Dizziness and giddiness: Secondary | ICD-10-CM | POA: Diagnosis not present
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
## 2021-10-12 ENCOUNTER — Ambulatory Visit: Payer: Medicare Other

## 2021-10-12 DIAGNOSIS — R42 Dizziness and giddiness: Secondary | ICD-10-CM

## 2021-10-12 DIAGNOSIS — M6281 Muscle weakness (generalized): Secondary | ICD-10-CM

## 2021-10-17 ENCOUNTER — Ambulatory Visit: Payer: Medicare Other

## 2021-10-17 DIAGNOSIS — R42 Dizziness and giddiness: Secondary | ICD-10-CM | POA: Diagnosis not present

## 2021-10-17 DIAGNOSIS — M6281 Muscle weakness (generalized): Secondary | ICD-10-CM
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
## 2021-10-19 ENCOUNTER — Ambulatory Visit: Payer: Medicare Other

## 2021-10-19 DIAGNOSIS — R42 Dizziness and giddiness: Secondary | ICD-10-CM | POA: Diagnosis not present

## 2021-10-19 DIAGNOSIS — M6281 Muscle weakness (generalized): Secondary | ICD-10-CM
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
## 2021-10-24 ENCOUNTER — Ambulatory Visit: Payer: Medicare Other

## 2021-10-24 DIAGNOSIS — M6281 Muscle weakness (generalized): Secondary | ICD-10-CM

## 2021-10-24 DIAGNOSIS — R42 Dizziness and giddiness: Secondary | ICD-10-CM | POA: Diagnosis not present

## 2021-10-26 ENCOUNTER — Ambulatory Visit: Payer: Medicare Other | Attending: Family Medicine

## 2021-10-26 DIAGNOSIS — M6281 Muscle weakness (generalized): Secondary | ICD-10-CM | POA: Diagnosis present

## 2021-10-26 DIAGNOSIS — R42 Dizziness and giddiness: Secondary | ICD-10-CM | POA: Insufficient documentation

## 2021-10-31 ENCOUNTER — Ambulatory Visit: Payer: Medicare Other

## 2021-10-31 DIAGNOSIS — M6281 Muscle weakness (generalized): Secondary | ICD-10-CM | POA: Diagnosis not present

## 2021-10-31 DIAGNOSIS — R42 Dizziness and giddiness: Secondary | ICD-10-CM
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
## 2021-11-02 ENCOUNTER — Ambulatory Visit: Payer: Medicare Other

## 2021-11-02 DIAGNOSIS — M6281 Muscle weakness (generalized): Secondary | ICD-10-CM | POA: Diagnosis not present
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
## 2021-11-07 ENCOUNTER — Ambulatory Visit: Payer: Medicare Other

## 2021-11-07 DIAGNOSIS — M6281 Muscle weakness (generalized): Secondary | ICD-10-CM | POA: Diagnosis not present
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
## 2021-11-09 ENCOUNTER — Ambulatory Visit: Payer: Medicare Other

## 2021-11-09 DIAGNOSIS — M6281 Muscle weakness (generalized): Secondary | ICD-10-CM | POA: Diagnosis not present
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
## 2021-11-14 ENCOUNTER — Ambulatory Visit: Payer: Medicare Other

## 2021-11-14 DIAGNOSIS — M6281 Muscle weakness (generalized): Secondary | ICD-10-CM | POA: Diagnosis not present

## 2021-11-14 DIAGNOSIS — R42 Dizziness and giddiness: Secondary | ICD-10-CM
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
## 2021-11-16 ENCOUNTER — Ambulatory Visit: Payer: Medicare Other

## 2021-11-16 DIAGNOSIS — M6281 Muscle weakness (generalized): Secondary | ICD-10-CM

## 2021-11-16 DIAGNOSIS — R42 Dizziness and giddiness: Secondary | ICD-10-CM

## 2021-12-07 ENCOUNTER — Ambulatory Visit: Payer: Medicare Other | Attending: Family Medicine

## 2021-12-07 DIAGNOSIS — R42 Dizziness and giddiness: Secondary | ICD-10-CM | POA: Insufficient documentation

## 2021-12-07 DIAGNOSIS — M6281 Muscle weakness (generalized): Secondary | ICD-10-CM | POA: Diagnosis not present
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
## 2022-01-31 ENCOUNTER — Other Ambulatory Visit: Payer: Self-pay | Admitting: Family Medicine

## 2022-01-31 DIAGNOSIS — G500 Trigeminal neuralgia: Secondary | ICD-10-CM

## 2022-02-03 ENCOUNTER — Ambulatory Visit
Admission: RE | Admit: 2022-02-03 | Discharge: 2022-02-03 | Disposition: A | Discharge status: Home / Self Care | Payer: Medicare Other | Source: Ambulatory Visit | Attending: Family Medicine | Admitting: Family Medicine

## 2022-02-03 DIAGNOSIS — G500 Trigeminal neuralgia: Secondary | ICD-10-CM | POA: Diagnosis present

## 2022-02-03 MED ORDER — GADOBUTROL 1 MMOL/ML IV SOLN
10.0000 mL | Freq: Once | INTRAVENOUS | Status: AC | PRN
  Administered 2022-02-03: 10 mL via INTRAVENOUS

## 2023-02-26 DIAGNOSIS — S065XAA Traumatic subdural hemorrhage with loss of consciousness status unknown, initial encounter: Secondary | ICD-10-CM

## 2023-02-26 HISTORY — DX: Traumatic subdural hemorrhage with loss of consciousness status unknown, initial encounter: S06.5XAA

## 2023-03-04 DIAGNOSIS — R2681 Unsteadiness on feet: Secondary | ICD-10-CM | POA: Diagnosis not present

## 2023-03-04 DIAGNOSIS — M6281 Muscle weakness (generalized): Secondary | ICD-10-CM | POA: Diagnosis not present

## 2023-03-06 DIAGNOSIS — R2681 Unsteadiness on feet: Secondary | ICD-10-CM | POA: Diagnosis not present

## 2023-03-06 DIAGNOSIS — M6281 Muscle weakness (generalized): Secondary | ICD-10-CM | POA: Diagnosis not present

## 2023-03-21 DIAGNOSIS — M6281 Muscle weakness (generalized): Secondary | ICD-10-CM | POA: Diagnosis not present

## 2023-03-21 DIAGNOSIS — R2681 Unsteadiness on feet: Secondary | ICD-10-CM | POA: Diagnosis not present

## 2023-03-28 DIAGNOSIS — M6281 Muscle weakness (generalized): Secondary | ICD-10-CM | POA: Diagnosis not present

## 2023-03-28 DIAGNOSIS — R2681 Unsteadiness on feet: Secondary | ICD-10-CM | POA: Diagnosis not present

## 2023-04-04 DIAGNOSIS — R2681 Unsteadiness on feet: Secondary | ICD-10-CM | POA: Diagnosis not present

## 2023-04-04 DIAGNOSIS — M6281 Muscle weakness (generalized): Secondary | ICD-10-CM | POA: Diagnosis not present

## 2023-04-07 ENCOUNTER — Emergency Department: Payer: PPO

## 2023-04-07 ENCOUNTER — Observation Stay
Admission: EM | Admit: 2023-04-07 | Discharge: 2023-04-08 | Disposition: A | Discharge status: Home / Self Care | Payer: PPO | Attending: Internal Medicine | Admitting: Internal Medicine

## 2023-04-07 ENCOUNTER — Encounter: Payer: Self-pay | Admitting: Family Medicine

## 2023-04-07 ENCOUNTER — Other Ambulatory Visit: Payer: Self-pay

## 2023-04-07 DIAGNOSIS — G20C Parkinsonism, unspecified: Secondary | ICD-10-CM | POA: Diagnosis not present

## 2023-04-07 DIAGNOSIS — R1084 Generalized abdominal pain: Secondary | ICD-10-CM | POA: Diagnosis not present

## 2023-04-07 DIAGNOSIS — I6523 Occlusion and stenosis of bilateral carotid arteries: Secondary | ICD-10-CM | POA: Diagnosis not present

## 2023-04-07 DIAGNOSIS — K529 Noninfective gastroenteritis and colitis, unspecified: Principal | ICD-10-CM | POA: Diagnosis present

## 2023-04-07 DIAGNOSIS — I517 Cardiomegaly: Secondary | ICD-10-CM | POA: Diagnosis not present

## 2023-04-07 DIAGNOSIS — X31XXXA Exposure to excessive natural cold, initial encounter: Secondary | ICD-10-CM | POA: Diagnosis not present

## 2023-04-07 DIAGNOSIS — T68XXXA Hypothermia, initial encounter: Secondary | ICD-10-CM | POA: Insufficient documentation

## 2023-04-07 DIAGNOSIS — R0989 Other specified symptoms and signs involving the circulatory and respiratory systems: Secondary | ICD-10-CM | POA: Diagnosis not present

## 2023-04-07 DIAGNOSIS — K219 Gastro-esophageal reflux disease without esophagitis: Secondary | ICD-10-CM | POA: Insufficient documentation

## 2023-04-07 DIAGNOSIS — I959 Hypotension, unspecified: Secondary | ICD-10-CM | POA: Insufficient documentation

## 2023-04-07 DIAGNOSIS — R55 Syncope and collapse: Principal | ICD-10-CM | POA: Insufficient documentation

## 2023-04-07 DIAGNOSIS — Z7982 Long term (current) use of aspirin: Secondary | ICD-10-CM | POA: Diagnosis not present

## 2023-04-07 DIAGNOSIS — K5730 Diverticulosis of large intestine without perforation or abscess without bleeding: Secondary | ICD-10-CM | POA: Diagnosis not present

## 2023-04-07 DIAGNOSIS — R110 Nausea: Secondary | ICD-10-CM | POA: Diagnosis not present

## 2023-04-07 DIAGNOSIS — I129 Hypertensive chronic kidney disease with stage 1 through stage 4 chronic kidney disease, or unspecified chronic kidney disease: Secondary | ICD-10-CM | POA: Insufficient documentation

## 2023-04-07 DIAGNOSIS — E1122 Type 2 diabetes mellitus with diabetic chronic kidney disease: Secondary | ICD-10-CM | POA: Diagnosis not present

## 2023-04-07 DIAGNOSIS — N289 Disorder of kidney and ureter, unspecified: Secondary | ICD-10-CM | POA: Diagnosis not present

## 2023-04-07 DIAGNOSIS — R109 Unspecified abdominal pain: Secondary | ICD-10-CM | POA: Diagnosis not present

## 2023-04-07 DIAGNOSIS — R531 Weakness: Secondary | ICD-10-CM | POA: Diagnosis not present

## 2023-04-07 DIAGNOSIS — R300 Dysuria: Secondary | ICD-10-CM | POA: Diagnosis not present

## 2023-04-07 DIAGNOSIS — Z79899 Other long term (current) drug therapy: Secondary | ICD-10-CM | POA: Insufficient documentation

## 2023-04-07 DIAGNOSIS — Z7984 Long term (current) use of oral hypoglycemic drugs: Secondary | ICD-10-CM | POA: Insufficient documentation

## 2023-04-07 DIAGNOSIS — Z1152 Encounter for screening for COVID-19: Secondary | ICD-10-CM | POA: Diagnosis not present

## 2023-04-07 DIAGNOSIS — R112 Nausea with vomiting, unspecified: Secondary | ICD-10-CM | POA: Diagnosis present

## 2023-04-07 DIAGNOSIS — K439 Ventral hernia without obstruction or gangrene: Secondary | ICD-10-CM | POA: Diagnosis not present

## 2023-04-07 DIAGNOSIS — M47812 Spondylosis without myelopathy or radiculopathy, cervical region: Secondary | ICD-10-CM | POA: Diagnosis not present

## 2023-04-07 DIAGNOSIS — M4802 Spinal stenosis, cervical region: Secondary | ICD-10-CM | POA: Diagnosis not present

## 2023-04-07 DIAGNOSIS — R519 Headache, unspecified: Secondary | ICD-10-CM | POA: Diagnosis not present

## 2023-04-07 DIAGNOSIS — N183 Chronic kidney disease, stage 3 unspecified: Secondary | ICD-10-CM | POA: Diagnosis not present

## 2023-04-07 HISTORY — DX: Nausea with vomiting, unspecified: R11.2

## 2023-04-07 LAB — COMPREHENSIVE METABOLIC PANEL
ALT: 8 U/L (ref 0–44)
AST: 22 U/L (ref 15–41)
Albumin: 3.2 g/dL — ABNORMAL LOW (ref 3.5–5.0)
Alkaline Phosphatase: 97 U/L (ref 38–126)
Anion gap: 14 (ref 5–15)
BUN: 47 mg/dL — ABNORMAL HIGH (ref 8–23)
CO2: 18 mmol/L — ABNORMAL LOW (ref 22–32)
Calcium: 8.4 mg/dL — ABNORMAL LOW (ref 8.9–10.3)
Chloride: 103 mmol/L (ref 98–111)
Creatinine, Ser: 1.46 mg/dL — ABNORMAL HIGH (ref 0.61–1.24)
GFR, Estimated: 47 mL/min — ABNORMAL LOW (ref >60)
Glucose, Bld: 130 mg/dL — ABNORMAL HIGH (ref 70–99)
Potassium: 5.2 mmol/L — ABNORMAL HIGH (ref 3.5–5.1)
Sodium: 135 mmol/L (ref 135–145)
Total Bilirubin: 0.5 mg/dL (ref 0.0–1.2)
Total Protein: 6.7 g/dL (ref 6.5–8.1)

## 2023-04-07 LAB — CBC WITH DIFFERENTIAL/PLATELET
Abs Immature Granulocytes: 0.02 10*3/uL (ref 0.00–0.07)
Basophils Absolute: 0 10*3/uL (ref 0.0–0.1)
Basophils Relative: 1 %
Eosinophils Absolute: 0.3 10*3/uL (ref 0.0–0.5)
Eosinophils Relative: 4 %
HCT: 34.5 % — ABNORMAL LOW (ref 39.0–52.0)
Hemoglobin: 11.1 g/dL — ABNORMAL LOW (ref 13.0–17.0)
Immature Granulocytes: 0 %
Lymphocytes Relative: 18 %
Lymphs Abs: 1.1 10*3/uL (ref 0.7–4.0)
MCH: 31.7 pg (ref 26.0–34.0)
MCHC: 32.2 g/dL (ref 30.0–36.0)
MCV: 98.6 fL (ref 80.0–100.0)
Monocytes Absolute: 0.5 10*3/uL (ref 0.1–1.0)
Monocytes Relative: 8 %
Neutro Abs: 4.2 10*3/uL (ref 1.7–7.7)
Neutrophils Relative %: 69 %
Platelets: 154 10*3/uL (ref 150–400)
RBC: 3.5 MIL/uL — ABNORMAL LOW (ref 4.22–5.81)
RDW: 13.2 % (ref 11.5–15.5)
WBC: 6 10*3/uL (ref 4.0–10.5)
nRBC: 0 % (ref 0.0–0.2)

## 2023-04-07 LAB — URINALYSIS, W/ REFLEX TO CULTURE (INFECTION SUSPECTED)
Bacteria, UA: NONE SEEN
Bilirubin Urine: NEGATIVE
Glucose, UA: NEGATIVE mg/dL
Hgb urine dipstick: NEGATIVE
Ketones, ur: NEGATIVE mg/dL
Leukocytes,Ua: NEGATIVE
Nitrite: NEGATIVE
Protein, ur: NEGATIVE mg/dL
Specific Gravity, Urine: 1.018 (ref 1.005–1.030)
Squamous Epithelial / HPF: 0 /[HPF] (ref 0–5)
pH: 5 (ref 5.0–8.0)

## 2023-04-07 LAB — RESP PANEL BY RT-PCR (RSV, FLU A&B, COVID)  RVPGX2
Influenza A by PCR: NEGATIVE
Influenza B by PCR: NEGATIVE
Resp Syncytial Virus by PCR: NEGATIVE
SARS Coronavirus 2 by RT PCR: NEGATIVE

## 2023-04-07 LAB — T4, FREE: Free T4: 0.74 ng/dL (ref 0.61–1.12)

## 2023-04-07 LAB — GLUCOSE, CAPILLARY: Glucose-Capillary: 152 mg/dL — ABNORMAL HIGH (ref 70–99)

## 2023-04-07 LAB — LACTIC ACID, PLASMA
Lactic Acid, Venous: 4.2 mmol/L (ref 0.5–1.9)
Lactic Acid, Venous: 2.1 mmol/L (ref 0.5–1.9)

## 2023-04-07 LAB — TROPONIN I (HIGH SENSITIVITY)
Troponin I (High Sensitivity): 5 ng/L (ref <18)
Troponin I (High Sensitivity): 5 ng/L (ref <18)

## 2023-04-07 LAB — TSH: TSH: 9.129 u[IU]/mL — ABNORMAL HIGH (ref 0.350–4.500)

## 2023-04-07 LAB — PROCALCITONIN: Procalcitonin: <0.1 ng/mL

## 2023-04-07 LAB — LIPASE, BLOOD: Lipase: 28 U/L (ref 11–51)

## 2023-04-07 LAB — CK: Total CK: 32 U/L — ABNORMAL LOW (ref 49–397)

## 2023-04-07 MED ORDER — POTASSIUM CHLORIDE IN NACL 20-0.9 MEQ/L-% IV SOLN: INTRAVENOUS | Status: DC

## 2023-04-07 MED ORDER — ZOLPIDEM TARTRATE 5 MG PO TABS: 2.5000 mg | ORAL_TABLET | Freq: Every evening | ORAL | Status: DC | PRN

## 2023-04-07 MED ORDER — DOXAZOSIN MESYLATE 4 MG PO TABS
4.0000 mg | ORAL_TABLET | Freq: Every day | ORAL | Status: DC
  Administered 2023-04-07 – 2023-04-08 (×2): 4 mg via ORAL
  Filled 2023-04-07 (×2): qty 1

## 2023-04-07 MED ORDER — ENOXAPARIN SODIUM 40 MG/0.4ML IJ SOSY
40.0000 mg | PREFILLED_SYRINGE | INTRAMUSCULAR | Status: DC
  Administered 2023-04-07 – 2023-04-08 (×2): 40 mg via SUBCUTANEOUS
  Filled 2023-04-07 (×2): qty 0.4

## 2023-04-07 MED ORDER — SODIUM CHLORIDE 0.9 % IV SOLN: INTRAVENOUS | Status: AC

## 2023-04-07 MED ORDER — ONDANSETRON HCL 4 MG PO TABS: 4.0000 mg | ORAL_TABLET | Freq: Four times a day (QID) | ORAL | Status: DC | PRN

## 2023-04-07 MED ORDER — ACETAMINOPHEN 650 MG RE SUPP: 650.0000 mg | Freq: Four times a day (QID) | RECTAL | Status: DC | PRN

## 2023-04-07 MED ORDER — SODIUM CHLORIDE 0.9 % IV SOLN: 12.5000 mg | Freq: Four times a day (QID) | INTRAVENOUS | Status: DC | PRN

## 2023-04-07 MED ORDER — MELATONIN 5 MG PO TABS
10.0000 mg | ORAL_TABLET | Freq: Every day | ORAL | Status: DC
  Administered 2023-04-07: 10 mg via ORAL
  Filled 2023-04-07: qty 2

## 2023-04-07 MED ORDER — ACETAMINOPHEN 325 MG PO TABS: 650.0000 mg | ORAL_TABLET | Freq: Four times a day (QID) | ORAL | Status: DC | PRN

## 2023-04-07 MED ORDER — PANTOPRAZOLE SODIUM 40 MG PO TBEC: 40.0000 mg | DELAYED_RELEASE_TABLET | Freq: Every day | ORAL | Status: DC

## 2023-04-07 MED ORDER — ALBUTEROL SULFATE (2.5 MG/3ML) 0.083% IN NEBU: 2.5000 mg | INHALATION_SOLUTION | RESPIRATORY_TRACT | Status: DC | PRN

## 2023-04-07 MED ORDER — TAMSULOSIN HCL 0.4 MG PO CAPS
0.4000 mg | ORAL_CAPSULE | Freq: Every day | ORAL | Status: DC
  Administered 2023-04-07 – 2023-04-08 (×2): 0.4 mg via ORAL
  Filled 2023-04-07 (×2): qty 1

## 2023-04-07 MED ORDER — FLUTICASONE PROPIONATE 50 MCG/ACT NA SUSP
2.0000 | Freq: Every day | NASAL | Status: DC
  Filled 2023-04-07: qty 16

## 2023-04-07 MED ORDER — PANTOPRAZOLE SODIUM 40 MG IV SOLR
40.0000 mg | INTRAVENOUS | Status: DC
  Administered 2023-04-07 – 2023-04-08 (×2): 40 mg via INTRAVENOUS
  Filled 2023-04-07 (×2): qty 10

## 2023-04-07 MED ORDER — OXYCODONE HCL 5 MG PO TABS: 5.0000 mg | ORAL_TABLET | ORAL | Status: DC | PRN

## 2023-04-07 MED ORDER — CARBAMAZEPINE ER 200 MG PO TB12
200.0000 mg | ORAL_TABLET | Freq: Four times a day (QID) | ORAL | Status: DC
Start: 2023-04-07 — End: 2023-04-08
  Administered 2023-04-07 – 2023-04-08 (×3): 200 mg via ORAL
  Filled 2023-04-07 (×4): qty 1

## 2023-04-07 MED ORDER — DIPHENHYDRAMINE HCL 25 MG PO CAPS: 25.0000 mg | ORAL_CAPSULE | Freq: Every evening | ORAL | Status: DC | PRN

## 2023-04-07 MED ORDER — CELECOXIB 200 MG PO CAPS: 200.0000 mg | ORAL_CAPSULE | Freq: Two times a day (BID) | ORAL | Status: DC

## 2023-04-07 MED ORDER — MONTELUKAST SODIUM 10 MG PO TABS
10.0000 mg | ORAL_TABLET | Freq: Every day | ORAL | Status: DC
  Administered 2023-04-07: 10 mg via ORAL
  Filled 2023-04-07: qty 1

## 2023-04-07 MED ORDER — OXYBUTYNIN CHLORIDE 5 MG PO TABS
5.0000 mg | ORAL_TABLET | Freq: Three times a day (TID) | ORAL | Status: DC | PRN
Start: 2023-04-07 — End: 2023-04-08

## 2023-04-07 MED ORDER — NORTRIPTYLINE HCL 25 MG PO CAPS
50.0000 mg | ORAL_CAPSULE | Freq: Every day | ORAL | Status: DC
  Administered 2023-04-07: 50 mg via ORAL
  Filled 2023-04-07: qty 2

## 2023-04-07 MED ORDER — DULOXETINE HCL 30 MG PO CPEP
60.0000 mg | ORAL_CAPSULE | Freq: Every day | ORAL | Status: DC
Start: 2023-04-07 — End: 2023-04-07

## 2023-04-07 MED ORDER — IOHEXOL 300 MG/ML  SOLN
100.0000 mL | Freq: Once | INTRAMUSCULAR | Status: AC | PRN
  Administered 2023-04-07: 100 mL via INTRAVENOUS

## 2023-04-07 MED ORDER — SODIUM CHLORIDE 0.9 % IV BOLUS (SEPSIS)
2000.0000 mL | Freq: Once | INTRAVENOUS | Status: AC
  Administered 2023-04-07: 2000 mL via INTRAVENOUS

## 2023-04-07 MED ORDER — ONDANSETRON HCL 4 MG/2ML IJ SOLN
INTRAMUSCULAR | Status: AC
  Filled 2023-04-07: qty 2

## 2023-04-07 MED ORDER — DIPHENHYDRAMINE HCL 25 MG PO CAPS
25.0000 mg | ORAL_CAPSULE | Freq: Every day | ORAL | Status: DC
  Administered 2023-04-07: 25 mg via ORAL
  Filled 2023-04-07: qty 1

## 2023-04-07 MED ORDER — HYDROXYZINE HCL 10 MG PO TABS: 10.0000 mg | ORAL_TABLET | Freq: Four times a day (QID) | ORAL | Status: DC | PRN

## 2023-04-07 MED ORDER — HYDROCORTISONE 1 % EX CREA
TOPICAL_CREAM | Freq: Three times a day (TID) | CUTANEOUS | Status: DC
  Filled 2023-04-07: qty 28

## 2023-04-07 MED ORDER — METOPROLOL SUCCINATE ER 25 MG PO TB24
25.0000 mg | ORAL_TABLET | Freq: Every day | ORAL | Status: DC
  Administered 2023-04-07 – 2023-04-08 (×2): 25 mg via ORAL
  Filled 2023-04-07 (×2): qty 1

## 2023-04-07 MED ORDER — ASPIRIN 81 MG PO TBEC
81.0000 mg | DELAYED_RELEASE_TABLET | Freq: Every day | ORAL | Status: DC
  Administered 2023-04-07 – 2023-04-08 (×2): 81 mg via ORAL
  Filled 2023-04-07 (×2): qty 1

## 2023-04-07 MED ORDER — DOXAZOSIN MESYLATE 4 MG PO TABS: 4.0000 mg | ORAL_TABLET | Freq: Every day | ORAL | Status: DC

## 2023-04-07 MED ORDER — PIPERACILLIN-TAZOBACTAM 3.375 G IVPB
3.3750 g | Freq: Once | INTRAVENOUS | Status: AC
  Administered 2023-04-07: 3.375 g via INTRAVENOUS
  Filled 2023-04-07: qty 50

## 2023-04-07 MED ORDER — CARBIDOPA-LEVODOPA 25-100 MG PO TABS
1.0000 | ORAL_TABLET | Freq: Three times a day (TID) | ORAL | Status: DC
  Administered 2023-04-07 – 2023-04-08 (×4): 1 via ORAL
  Filled 2023-04-07 (×6): qty 1

## 2023-04-07 MED ORDER — GABAPENTIN 100 MG PO CAPS
100.0000 mg | ORAL_CAPSULE | Freq: Two times a day (BID) | ORAL | Status: DC
  Administered 2023-04-07 – 2023-04-08 (×3): 100 mg via ORAL
  Filled 2023-04-07 (×3): qty 1

## 2023-04-07 MED ORDER — ONDANSETRON HCL 4 MG/2ML IJ SOLN
4.0000 mg | Freq: Four times a day (QID) | INTRAMUSCULAR | Status: DC | PRN
Start: 2023-04-07 — End: 2023-04-08

## 2023-04-07 MED ORDER — VANCOMYCIN HCL 2000 MG/400ML IV SOLN
2000.0000 mg | Freq: Once | INTRAVENOUS | Status: AC
  Administered 2023-04-07: 2000 mg via INTRAVENOUS
  Filled 2023-04-07 (×2): qty 400

## 2023-04-07 NOTE — Progress Notes (Signed)
 ED Pharmacy Antibiotic Sign Off An antibiotic consult was received from an ED provider for Vancomycin  per pharmacy dosing for sepsis. A chart review was completed to assess appropriateness.   The following one time order(s) were placed:  Vancomycin  2000 mg per pt wt: 96.6 kg from 02/25/23  Further antibiotic and/or antibiotic pharmacy consults should be ordered by the admitting provider if indicated.   Thank you for allowing pharmacy to be a part of this patient's care.   Thank you, Coretta Dexter, PharmD, [facility] 04/07/2023 2:29 AM

## 2023-04-07 NOTE — ED Triage Notes (Signed)
 Pt here after syncopal episode at home on toilet. Denies head strike/fall. States has been feeling sick all day w/ N/V. Hypotensive on EMS arrival and received 750 of NS en route. BP WNL on arrival.

## 2023-04-07 NOTE — ED Provider Notes (Signed)
 [facility] Provider Note    Event Date/Time   First MD Initiated Contact with Patient 04/07/23 0129     (approximate)   History   Loss of Consciousness   HPI  Jack White is a 83 y.o. male   Past medical history of colon perforation, CKD, diabetes, high blood pressure and Parkinson's who presents to the emergency department with several days of nausea vomiting and loose stools abdominal pain and then while sitting on the toilet today to urinate felt very weak and could not get up.    Presyncope-did not lose consciousness, did not hit his head.    EMS states that he was hypotensive.  Responded to minimal crystalloid.  He denies any respiratory infectious symptoms and his only complaint is that he has been nauseous, poor p.o. intake, abdominal pain on and off for the last several days.  He denies GI bleeding.  He does state that he has had some dysuria with urination as well.  He has a headache.  He feels globally weak and tired, stating that when he was on the toilet he could not get up because he was so weak.   Independent Historian contributed to assessment above:  His family arrives later during the evaluation to corroborate information past medical history as above, also noting that his last known normal was 8:30 PM when they talk to him on the phone and seemed to be talking normally but when they went to his house his speech seemed slowed.    Physical Exam   Triage Vital Signs: ED Triage Vitals  Encounter Vitals Group     BP 04/07/23 0124 127/66     Systolic BP Percentile --      Diastolic BP Percentile --      Pulse Rate 04/07/23 0124 67     Resp 04/07/23 0124 18     Temp --      Temp src --      SpO2 04/07/23 0124 99 %     Weight --      Height --      Head Circumference --      Peak Flow --      Pain Score 04/07/23 0126 3     Pain Loc --      Pain Education --      Exclude from Growth Chart --     Most recent vital  signs: Vitals:   04/07/23 0515 04/07/23 0535  BP: (!) 97/59 99/76  Pulse: 71 69  Resp:    Temp:    SpO2: 98% 98%    General: Awake, no distress.  CV:  Good peripheral perfusion.  Resp:  Normal effort.  Abd:  No distention.  Other:  Chronically ill-appearing gentleman in no acute distress.  He has global weakness but no obvious focality and his speech is slowed though he is answering questions appropriately.  He is able to move all extremities with global weakness throughout without any focality to the weakness and sensation is intact.  There is no facial asymmetry.  He has an old appearing healed surgical scar to his abdomen he winces when I palpate throughout.  There is no rigidity or guarding.  His lungs are clear to auscultation without focality or wheezing.   ED Results / Procedures / Treatments   Labs (all labs ordered are listed, but only abnormal results are displayed) Labs Reviewed  COMPREHENSIVE METABOLIC PANEL - Abnormal; Notable for the following components:  Result Value   Potassium 5.2 (*)    CO2 18 (*)    Glucose, Bld 130 (*)    BUN 47 (*)    Creatinine, Ser 1.46 (*)    Calcium  8.4 (*)    Albumin 3.2 (*)    GFR, Estimated 47 (*)    All other components within normal limits  CBC WITH DIFFERENTIAL/PLATELET - Abnormal; Notable for the following components:   RBC 3.50 (*)    Hemoglobin 11.1 (*)    HCT 34.5 (*)    All other components within normal limits  URINALYSIS, W/ REFLEX TO CULTURE (INFECTION SUSPECTED) - Abnormal; Notable for the following components:   Color, Urine YELLOW (*)    APPearance CLEAR (*)    All other components within normal limits  CK - Abnormal; Notable for the following components:   Total CK 32 (*)    All other components within normal limits  LACTIC ACID, PLASMA - Abnormal; Notable for the following components:   Lactic Acid, Venous 4.2 (*)    All other components within normal limits  LACTIC ACID, PLASMA - Abnormal; Notable for  the following components:   Lactic Acid, Venous 2.1 (*)    All other components within normal limits  TSH - Abnormal; Notable for the following components:   TSH 9.129 (*)    All other components within normal limits  RESP PANEL BY RT-PCR (RSV, FLU A&B, COVID)  [id number]  CULTURE, BLOOD (ROUTINE X 2)  CULTURE, BLOOD (ROUTINE X 2)  LIPASE, BLOOD  T4, FREE  TROPONIN I (HIGH SENSITIVITY)  TROPONIN I (HIGH SENSITIVITY)     I ordered and reviewed the above labs they are notable for white blood cell count is normal.  His creatinine is slightly elevated but at baseline compared to prior.  EKG  ED ECG REPORT I, Buell Carmin, the attending physician, personally viewed and interpreted this ECG.   Date: 04/07/2023  EKG Time: 0138  Rate: 66  Rhythm: sinus  Axis: nl  Intervals:none  ST&T Change: no stemi    RADIOLOGY I independently reviewed and interpreted chest x-ray and I see no obvious focality pneumothorax I also reviewed radiologist's formal read.   PROCEDURES:  Critical Care performed: Yes, see critical care procedure note(s)  .Critical Care  Performed by: Buell Carmin, MD Authorized by: Buell Carmin, MD   Critical care provider statement:    Critical care time (minutes):  30   Critical care was time spent personally by me on the following activities:  Development of treatment plan with patient or surrogate, discussions with consultants, evaluation of patient's response to treatment, examination of patient, ordering and review of laboratory studies, ordering and review of radiographic studies, ordering and performing treatments and interventions, pulse oximetry, re-evaluation of patient's condition and review of old charts    MEDICATIONS ORDERED IN ED: Medications  vancomycin  (VANCOREADY) IVPB 2000 mg/400 mL (2,000 mg Intravenous New Bag/Given 04/07/23 0439)  ondansetron  (ZOFRAN ) 4 MG/2ML injection (  Given 04/07/23 0205)  sodium chloride  0.9 % bolus 2,000 mL (0 mLs  Intravenous Stopped 04/07/23 0435)  piperacillin -tazobactam (ZOSYN ) IVPB 3.375 g (0 g Intravenous Stopped 04/07/23 0422)  iohexol  (OMNIPAQUE ) 300 MG/ML solution 100 mL (100 mLs Intravenous Contrast Given 04/07/23 0240)     IMPRESSION / MDM / ASSESSMENT AND PLAN / ED COURSE  I reviewed the triage vital signs and the nursing notes.  Patient's presentation is most consistent with acute presentation with potential threat to life or bodily function.  Differential diagnosis includes, but is not limited to, presyncope, vasovagal reaction, dehydration orthostatic hypotension, sepsis, abdominal infection, viral infection, urinary tract infection, head injury   The patient is on the cardiac monitor to evaluate for evidence of arrhythmia and/or significant heart rate changes.  MDM:    This is a patient had a presyncopal episode on the toilet today without head strike or complete loss of consciousness but has been feeling sick for the last several days with nausea and abdominal pain poor p.o. intake.  Reportedly hypotensive in the field by EMS but responded nicely to small amount of crystalloids says that his initial vital signs were normal in the emergency department.  Shortly thereafter upon recheck, he does appear hypotensive with maps just below 65 and is noted to be hypothermic on rectal temperature 95.1 this was placed on a Lawyer.  Given his symptoms, global weakness, and vital signs as above, and concern for sepsis wife given him sepsis crystalloid bolus 30 cc/kg ideal body weight and broad-spectrum antibiotics prophylactically workup pending to source.  I suspect intra-abdominal source or urinary source given those are his chief complaints of abdominal pain/nausea as well as dysuria.  He has global weakness from his several days of illness and no focality suggest stroke.  Furthermore his family states that his speech appears slowed and this is the only  abnormality noted however his last known normal is well outside of thrombolytic window and thus stroke activation not pursued at this time.  Check thyroid  levels  Anticipate that he will be admitted after initial lab testing, imaging, and response to treatment evaluated in ED.     -- Workup largely unremarkable as imaging and lab testing reveals no obvious source of infection.  I did consider mesenteric ischemia but if this is less likely given his relatively benign abdominal exam with mild tenderness diffusely without pain out of proportion.  His lactic albeit elevated initially has been downtrending significantly with fluids.  Admission.        FINAL CLINICAL IMPRESSION(S) / ED DIAGNOSES   Final diagnoses:  Near syncope  Nausea  Generalized abdominal pain  Dysuria  Hypothermia, initial encounter  Hypotension, unspecified hypotension type     Rx / DC Orders   ED Discharge Orders     None        Note:  This document was prepared using Dragon voice recognition software and may include unintentional dictation errors.    Buell Carmin, MD 04/07/23 [phone]

## 2023-04-07 NOTE — H&P (Signed)
 History and Physical    Jack White:[id number] DOB: [birth year] DOA: 04/07/2023  PCP: Nestor Banter, MD  Patient coming from: Home  I have personally briefly reviewed patient's old medical records in [facility]  Chief Complaint: Intractable n/v  HPI: Jack White is a 83 y.o. male with medical history significant of Parkinson disease to the ED with several days of nausea vomiting and loose stools associated with abdominal pain.  On the day of presentation sat on the toilet to urinate and felt very weak and could not get back up.  EMS called.  On presentation patient's only complaint is of severe nausea and poor p.o. intake.  No evidence of GI blood loss.  Feels substantially weak and tired off baseline.  Vital signs stable.  CT abdomen pelvis performed without acute findings to explain symptoms.  Laboratory investigation overall reassuring.  ED Course: Patient was given a bolus of crystalloid.  Symptoms persisted.  Hospitalist contacted for admission for  Review of Systems: As per HPI otherwise 14 point review of systems negative.    Past Medical History:  Diagnosis Date   Acute cholecystitis 05/21/14   BP (high blood pressure) 10/14/2014   Chronic kidney disease (CKD), stage III (moderate) (HCC) 02/15/2014   Colon perforation (HCC) 05/28/2014   Diabetes mellitus without complication (HCC)    [name]'s neuralgia 10/14/2014   Headache    Hypertension    Intractable nausea and vomiting 04/07/2023   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    Past Surgical History:  Procedure Laterality Date   COLOSTOMY  05/28/2014   Dr. Luise Saint   COLOSTOMY TAKEDOWN N/A 10/26/2014   Procedure: COLOSTOMY TAKEDOWN;  Surgeon: Rhina Center III, MD;  Location: [facility] ORS;  Service: General;  Laterality: N/A;   EXPLORATORY LAPAROTOMY  05/28/2014   Dr. Luise Saint   LAPAROSCOPIC CHOLECYSTECTOMY  05/21/2014   Dr. Amalia Badder     reports that he has never smoked. He has never used smokeless tobacco. He reports  that he does not drink alcohol and does not use drugs.  Allergies  Allergen Reactions   Citrus    Dilaudid [Hydromorphone Hcl] Other (See Comments)    Respiratory arrest   Tomato Other (See Comments)    GI distress, respiratory distress    Family History  Problem Relation Age of Onset   Diabetes Mother    Hypertension Mother    Heart disease Father    No family history of Parkinsons  Prior to Admission medications   Medication Sig Start Date End Date Taking? Authorizing Provider  carbidopa -levodopa  (SINEMET  IR) 25-100 MG tablet Take 1 tablet by mouth 3 (three) times daily. 11/13/22 11/13/23 Yes [provider]  clonazePAM (KLONOPIN) 0.5 MG tablet Take by mouth. 11/05/22  Yes [provider]  fluticasone  (FLONASE ) 50 MCG/ACT nasal spray Place 2 sprays into both nostrils daily. 12/10/22 12/10/23 Yes [provider]  metoprolol  succinate (TOPROL -XL) 25 MG 24 hr tablet Take 1 tablet by mouth daily. 03/24/23 03/23/24 Yes [provider]  nortriptyline  (PAMELOR ) 50 MG capsule Take 50 mg by mouth at bedtime. 11/11/22  Yes [provider]  tamsulosin  (FLOMAX ) 0.4 MG CAPS capsule Take 0.4 mg by mouth daily after breakfast. 03/05/23 03/04/24 Yes [provider]  aspirin  81 MG tablet Take 81 mg by mouth daily.    [provider]  atorvastatin  (LIPITOR) 10 MG tablet  10/03/14   [provider]  carbamazepine  (CARBATROL ) 200 MG 12 hr capsule Take by mouth.    [provider]  celecoxib  (CELEBREX ) 200 MG capsule Take 200 mg by mouth 2 (two) times daily.    [provider]  doxazosin  (CARDURA ) 4 MG tablet Take 1 tablet by mouth daily. 10/17/14   [provider]  gabapentin  (NEURONTIN ) 100 MG capsule Take 100 mg by mouth 2 (two) times daily.    [provider]  gabapentin  (NEURONTIN ) 300 MG capsule Take 2 capsules by mouth 3 (three) times daily. 08/17/14   [provider]  hydrocortisone  cream 1 %  Apply topically 3 (three) times daily. 06/26/14   Lauretta Ponto, MD  hydrOXYzine  (ATARAX /VISTARIL ) 10 MG tablet Take 1 tablet (10 mg total) by mouth every 6 (six) hours as needed for itching. 06/26/14   Lauretta Ponto, MD  lisinopril  (PRINIVIL ,ZESTRIL ) 2.5 MG tablet 2.5 mg daily.  10/12/14   [provider]  Melatonin 10 MG CAPS Take 1 capsule by mouth at bedtime.    [provider]  metFORMIN (GLUCOPHAGE) 1000 MG tablet Take 1,000 mg by mouth every morning.    [provider]  metoprolol  (LOPRESSOR ) 50 MG tablet Take 1 tablet by mouth 2 (two) times daily. 10/17/14   [provider]  metroNIDAZOLE  (FLAGYL ) 500 MG tablet Take 1 tablet (500 mg total) by mouth 3 (three) times daily. 11/09/14   Cleone Dad, MD  omeprazole (PRILOSEC) 20 MG capsule Take 1 capsule by mouth daily. 08/17/14   [provider]  zolpidem  (AMBIEN ) 5 MG tablet Take 0.5 tablets (2.5 mg total) by mouth at bedtime as needed for sleep. 11/03/14   Scherry Curtis, MD    Physical Exam: Vitals:   04/07/23 0404 04/07/23 0515 04/07/23 0535 04/07/23 0617  BP:  (!) 97/59 99/76 101/67  Pulse:  71 69 71  Resp:      Temp: (!) 97.4 F (36.3 C)     TempSrc: Oral     SpO2:  98% 98% 98%    Vitals:   04/07/23 0404 04/07/23 0515 04/07/23 0535 04/07/23 0617  BP:  (!) 97/59 99/76 101/67  Pulse:  71 69 71  Resp:      Temp: (!) 97.4 F (36.3 C)     TempSrc: Oral     SpO2:  98% 98% 98%   General: No acute distress.  Appears fatigued HEENT: Normocephalic, atraumatic Neck, supple, trachea midline, no tenderness Heart: Regular rate and rhythm, S1/S2 normal, no murmurs Lungs: Clear to auscultation bilaterally, no adventitious sounds, normal work of breathing Abdomen: Soft, nontender, nondistended, positive bowel sounds Extremities: Normal, atraumatic, no clubbing or cyanosis, normal muscle tone Skin: No rashes or lesions, normal color Neurologic: Cranial nerves grossly intact, sensation intact,  alert and oriented x3.  Slurred speech Psychiatric: Normal affect   Labs on Admission: I have personally reviewed following labs and imaging studies  CBC: Recent Labs  Lab 04/07/23 0132  WBC 6.0  NEUTROABS 4.2  HGB 11.1*  HCT 34.5*  MCV 98.6  PLT 154   Basic Metabolic Panel: Recent Labs  Lab 04/07/23 0132  NA 135  K 5.2*  CL 103  CO2 18*  GLUCOSE 130*  BUN 47*  CREATININE 1.46*  CALCIUM  8.4*   GFR: CrCl cannot be calculated (Unknown ideal weight.). Liver Function Tests: Recent Labs  Lab 04/07/23 0132  AST 22  ALT 8  ALKPHOS 97  BILITOT 0.5  PROT 6.7  ALBUMIN 3.2*   Recent Labs  Lab 04/07/23 0132  LIPASE 28   No results for input(s): "AMMONIA" in the last 168 hours. Coagulation  Profile: No results for input(s): "INR", "PROTIME" in the last 168 hours. Cardiac Enzymes: Recent Labs  Lab 04/07/23 0132  CKTOTAL 32*   BNP (last 3 results) No results for input(s): "PROBNP" in the last 8760 hours. HbA1C: No results for input(s): "HGBA1C" in the last 72 hours. CBG: No results for input(s): "GLUCAP" in the last 168 hours. Lipid Profile: No results for input(s): "CHOL", "HDL", "LDLCALC", "TRIG", "CHOLHDL", "LDLDIRECT" in the last 72 hours. Thyroid  Function Tests: Recent Labs    04/07/23 0132  TSH 9.129*  FREET4 0.74   Anemia Panel: No results for input(s): "VITAMINB12", "FOLATE", "FERRITIN", "TIBC", "IRON", "RETICCTPCT" in the last 72 hours. Urine analysis:    Component Value Date/Time   COLORURINE YELLOW (A) 04/07/2023 0405   APPEARANCEUR CLEAR (A) 04/07/2023 0405   APPEARANCEUR Clear 06/22/2014 2230   LABSPEC 1.018 04/07/2023 0405   LABSPEC 1.028 06/22/2014 2230   PHURINE 5.0 04/07/2023 0405   GLUCOSEU NEGATIVE 04/07/2023 0405   GLUCOSEU Negative 06/22/2014 2230   HGBUR NEGATIVE 04/07/2023 0405   BILIRUBINUR NEGATIVE 04/07/2023 0405   BILIRUBINUR Negative 06/22/2014 2230   KETONESUR NEGATIVE 04/07/2023 0405   PROTEINUR NEGATIVE 04/07/2023  0405   NITRITE NEGATIVE 04/07/2023 0405   LEUKOCYTESUR NEGATIVE 04/07/2023 0405   LEUKOCYTESUR Negative 06/22/2014 2230    Radiological Exams on Admission: DG Chest Port 1 View Result Date: 04/07/2023 CLINICAL DATA:  84 year old male with history of sepsis. EXAM: PORTABLE CHEST 1 VIEW COMPARISON:  Chest x-ray 05/29/2014. FINDINGS: Lung volumes are low. No confluent consolidative airspace disease. No pleural effusions. Bibasilar opacities may reflect areas of mild subsegmental atelectasis and/or scarring. No pneumothorax. No evidence of pulmonary edema. Heart size is borderline enlarged. Upper mediastinal contours are distorted by patient positioning. IMPRESSION: 1. Low lung volumes without radiographic evidence of acute cardiopulmonary disease. Electronically Signed   By: Alexandria Angel M.D.   On: 04/07/2023 05:34   CT ABDOMEN PELVIS W CONTRAST Result Date: 04/07/2023 CLINICAL DATA:  83 year old male with history of acute onset of nonlocalized abdominal pain. EXAM: CT ABDOMEN AND PELVIS WITH CONTRAST TECHNIQUE: Multidetector CT imaging of the abdomen and pelvis was performed using the standard protocol following bolus administration of intravenous contrast. RADIATION DOSE REDUCTION: This exam was performed according to the departmental dose-optimization program which includes automated exposure control, adjustment of the mA and/or kV according to patient size and/or use of iterative reconstruction technique. CONTRAST:  OMNIPAQUE  IOHEXOL  300 MG/ML  SOLN COMPARISON:  CT of the abdomen and pelvis 10/21/2014. FINDINGS: Lower chest: Mild scarring in the lung bases bilaterally. Atherosclerotic calcifications in the descending thoracic aorta. Hepatobiliary: No suspicious cystic or solid hepatic lesions. No intra or extrahepatic biliary ductal dilatation. Status post cholecystectomy. Pancreas: No pancreatic mass. No pancreatic ductal dilatation. No pancreatic or peripancreatic fluid collections or  inflammatory changes. Spleen: Unremarkable. Adrenals/Urinary Tract: Low-attenuation lesions in the right kidney compatible with simple cysts, largest of which is exophytic extending off the posterior aspect of the interpolar region measuring up to 3.3 cm in diameter (no imaging follow-up recommended). No aggressive appearing renal lesions. No hydroureteronephrosis. Urinary bladder is unremarkable in appearance. Bilateral adrenal glands are normal in appearance. Stomach/Bowel: The appearance of the stomach is normal. No pathologic dilatation of small bowel or colon. Numerous colonic diverticula are noted, without surrounding inflammatory changes to suggest an acute diverticulitis at this time. Postoperative changes of partial bowel resection are noted, with a suture line in the right lower quadrant from prior colostomy reversal. The appendix is not confidently identified  and may be surgically absent. Regardless, there are no inflammatory changes noted adjacent to the cecum to suggest the presence of an acute appendicitis at this time. Vascular/Lymphatic: Atherosclerosis in the abdominal aorta and pelvic vasculature, without evidence of aneurysm or dissection. No lymphadenopathy noted in the abdomen or pelvis. Reproductive: Prostate gland and seminal vesicles are unremarkable in appearance. Other: Small epigastric ventral hernia containing a small amount of omental fat. No significant volume of ascites. No pneumoperitoneum. Musculoskeletal: Bilateral pars defects are noted at L5 with 7 mm of anterolisthesis of L5 upon S1. There are no aggressive appearing lytic or blastic lesions noted in the visualized portions of the skeleton. IMPRESSION: 1. No acute findings are noted in the abdomen or pelvis to account for the patient's symptoms. 2. Colonic diverticulosis without evidence of acute diverticulitis at this time. 3. Aortic atherosclerosis. 4. 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 5.  Small epigastric ventral hernia containing a small amount of omental fat. No evidence of associated bowel incarceration or obstruction at this time. Aortic Atherosclerosis (ICD10-I70.0). Electronically Signed   By: Alexandria Angel M.D.   On: 04/07/2023 05:33   CT Cervical Spine Wo Contrast Result Date: 04/07/2023 CLINICAL DATA:  83 year old male with headache, syncope. EXAM: CT CERVICAL SPINE WITHOUT CONTRAST TECHNIQUE: Multidetector CT imaging of the cervical spine was performed without intravenous contrast. Multiplanar CT image reconstructions were also generated. RADIATION DOSE REDUCTION: This exam was performed according to the departmental dose-optimization program which includes automated exposure control, adjustment of the mA and/or kV according to patient size and/or use of iterative reconstruction technique. COMPARISON:  Head CT today. FINDINGS: Alignment: Mild straightening of cervical lordosis. Cervicothoracic junction alignment is within normal limits. Bilateral posterior element alignment is within normal limits. Skull base and vertebrae: Bone mineralization is within normal limits for age. Visualized skull base is intact. No atlanto-occipital dissociation. C1 and C2 appear intact and aligned. No acute osseous abnormality identified. Soft tissues and spinal canal: No prevertebral fluid or swelling. No visible canal hematoma. Bilateral cervical carotid calcified atherosclerosis. Otherwise negative noncontrast neck soft tissues. Disc levels:  Congenital appearing non segmentation of C2-C3. Superimposed cervical spine degeneration which is bulky at C5-C6 and C6-C7. Spinal stenosis at both of those levels, moderate to severe at C5-C6 (series 6, image 65). Bulky upper cervical facet arthropathy. Upper chest: Visible upper thoracic levels appear intact. Negative lung apices. IMPRESSION: 1. No acute traumatic injury identified in the cervical spine. 2. 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 Electronically Signed   By: Marlise Simpers M.D.   On: 04/07/2023 04:03   CT Head Wo Contrast Result Date: 04/07/2023 CLINICAL DATA:  84 year old male with headache, syncope. EXAM: CT HEAD WITHOUT CONTRAST TECHNIQUE: Contiguous axial images were obtained from the base of the skull through the vertex without intravenous contrast. RADIATION DOSE REDUCTION: This exam was performed according to the departmental dose-optimization program which includes automated exposure control, adjustment of the mA and/or kV according to patient size and/or use of iterative reconstruction technique. COMPARISON:  Brain MRI 02/03/2022.  Head CT 12/09/2007. FINDINGS: Brain: Stable cerebral volume. No midline shift, ventriculomegaly, mass effect, evidence of mass lesion, intracranial hemorrhage or evidence of cortically based acute infarction. Gray-white differentiation is stable and largely normal for age throughout the brain. Minimal to mild chronic white matter changes. Vascular: Calcified atherosclerosis at the skull base. No suspicious intracranial  vascular hyperdensity. Skull: No acute osseous abnormality identified. Sinuses/Orbits: Chronic paranasal sinus mucoperiosteal thickening appears stable to improved. Tympanic cavities and mastoids are well aerated. Other: Chronic left posterior scalp convexity scarring. No acute orbit or scalp soft tissue finding. IMPRESSION: 1. No acute intracranial abnormality. 2. Negative for age non contrast CT appearance of the brain. Electronically Signed   By: Marlise Simpers M.D.   On: 04/07/2023 04:00    EKG: Independently reviewed. NSR  Assessment/Plan Principal Problem:   Acute gastroenteritis Active Problems:   Intractable nausea and vomiting  Intractable nausea and vomiting Acute gastroenteritis Suspect viral etiology.  No other clear identifiable cause on imaging or laboratory  investigation to explain symptoms. Plan: Place in observation IV fluids x 24 hours Multimodal antiemetic regimen Full liquid diet, advance as tolerated  Parkinson's disease Continue home Sinemet  3 times daily Continue home gabapentin   Essential hypertension Continue metoprolol  per home dose  GERD PPI  DVT prophylaxis: SQ lovenox  Code Status: FULL Family Communication: None today Disposition Plan: Return to previous environment in 24 hours Consults called: None Admission status: Obs med surg    Tiajuana Fluke MD [facility]   If 7PM-7AM, please contact night-coverage  04/07/2023, 8:26 AM

## 2023-04-08 DIAGNOSIS — K529 Noninfective gastroenteritis and colitis, unspecified: Secondary | ICD-10-CM | POA: Diagnosis not present

## 2023-04-08 LAB — CBC
HCT: 29.2 % — ABNORMAL LOW (ref 39.0–52.0)
Hemoglobin: 9.6 g/dL — ABNORMAL LOW (ref 13.0–17.0)
MCH: 31.6 pg (ref 26.0–34.0)
MCHC: 32.9 g/dL (ref 30.0–36.0)
MCV: 96.1 fL (ref 80.0–100.0)
Platelets: 119 10*3/uL — ABNORMAL LOW (ref 150–400)
RBC: 3.04 MIL/uL — ABNORMAL LOW (ref 4.22–5.81)
RDW: 13.4 % (ref 11.5–15.5)
WBC: 4.6 10*3/uL (ref 4.0–10.5)
nRBC: 0 % (ref 0.0–0.2)

## 2023-04-08 LAB — BASIC METABOLIC PANEL
Anion gap: 6 (ref 5–15)
BUN: 33 mg/dL — ABNORMAL HIGH (ref 8–23)
CO2: 21 mmol/L — ABNORMAL LOW (ref 22–32)
Calcium: 7.9 mg/dL — ABNORMAL LOW (ref 8.9–10.3)
Chloride: 107 mmol/L (ref 98–111)
Creatinine, Ser: 1.29 mg/dL — ABNORMAL HIGH (ref 0.61–1.24)
GFR, Estimated: 55 mL/min — ABNORMAL LOW (ref >60)
Glucose, Bld: 90 mg/dL (ref 70–99)
Potassium: 5 mmol/L (ref 3.5–5.1)
Sodium: 134 mmol/L — ABNORMAL LOW (ref 135–145)

## 2023-04-08 LAB — GLUCOSE, CAPILLARY: Glucose-Capillary: 157 mg/dL — ABNORMAL HIGH (ref 70–99)

## 2023-04-08 MED ORDER — ONDANSETRON HCL 4 MG PO TABS: 4.0000 mg | ORAL_TABLET | Freq: Four times a day (QID) | ORAL | 0 refills | Status: DC | PRN

## 2023-04-08 NOTE — Evaluation (Signed)
Physical Therapy Evaluation Patient Details Name: Jack White MRN: [id number] DOB: [birth year] Today's Date: 04/08/2023  History of Present Illness  Jack White is a 83 y.o. male with medical history significant of Parkinson disease to the ED with several days of nausea vomiting and loose stools associated with abdominal pai and weakness; admitted for management of acute gastroenteritis  Clinical Impression  Patient resting in bed upon arrival to session; alert and oriented, follows commands, agreeable to session.  Eager/hopeful for discharge home later this date.  Bilat UE/LE strength and ROM grossly symmetrical and WFL; no focal weakness appreciated.  Denies pain. Able to complete bed mobility with mod indep; sit/stand, basic transfers and gait (150') with RW, sup/mod indep.  Demonstrates forward flexed, kyphotic posture with RW arms length anterior to patient; reciprocal stepping pattern, but limited heel strike/toe off with frequent shuffling during swing phases of gait. Fair cadence; no overt buckling or LOB.  Feels performance and abilities are near baseline for him (and voices desire to return to outpatient PT at discharge). Would benefit from skilled PT to address above deficits and promote optimal return to PLOF.; recommend post-acute PT follow up as indicated by interdisciplinary care team.            If plan is discharge home, recommend the following: A little help with bathing/dressing/bathroom;A little help with walking and/or transfers   Can travel by private vehicle        Equipment Recommendations    Recommendations for Other Services       Functional Status Assessment Patient has had a recent decline in their functional status and demonstrates the ability to make significant improvements in function in a reasonable and predictable amount of time.     Precautions / Restrictions Precautions Precautions: None Restrictions Weight Bearing Restrictions Per Provider  Order: No      Mobility  Bed Mobility Overal bed mobility: Modified Independent                  Transfers Overall transfer level: Modified independent Equipment used: Rolling walker (2 wheels)                    Ambulation/Gait Ambulation/Gait assistance: Contact guard assist, Supervision Gait Distance (Feet): 150 Feet Assistive device: Rolling walker (2 wheels)         General Gait Details: forward flexed, kyphotic posture with RW arms length anterior to patient; reciprocal stepping pattern, but limited heel strike/toe off with frequent shuffling during swing phases of gait.  Fair cadence; no overt buckling or LOB  Stairs            Wheelchair Mobility     Tilt Bed    Modified Rankin (Stroke Patients Only)       Balance Overall balance assessment: Needs assistance Sitting-balance support: No upper extremity supported, Feet supported Sitting balance-Leahy Scale: Good     Standing balance support: Bilateral upper extremity supported Standing balance-Leahy Scale: Fair                               Pertinent Vitals/Pain Pain Assessment Pain Assessment: No/denies pain    Home Living Family/patient expects to be discharged to:: Private residence Living Arrangements: Alone Available Help at Discharge: Family;Available PRN/intermittently Type of Home: Mobile home Home Access: Ramped entrance       Home Layout: One level Home Equipment: Agricultural consultant (2 wheels)      Prior  Function Prior Level of Function : Independent/Modified Independent             Mobility Comments: Mod indep with RW for ADLs, household mobilization; was actively participating with outpatient PT 1x/week       Extremity/Trunk Assessment   Upper Extremity Assessment Upper Extremity Assessment: Overall WFL for tasks assessed    Lower Extremity Assessment Lower Extremity Assessment: Overall WFL for tasks assessed       Communication         Cognition Arousal: Alert Behavior During Therapy: WFL for tasks assessed/performed   PT - Cognitive impairments: No apparent impairments                                 Cueing       General Comments      Exercises     Assessment/Plan    PT Assessment Patient needs continued PT services  PT Problem List Decreased range of motion;Decreased activity tolerance;Decreased balance;Decreased coordination;Decreased mobility;Decreased cognition;Decreased knowledge of use of DME;Decreased safety awareness;Decreased knowledge of precautions;Cardiopulmonary status limiting activity       PT Treatment Interventions Gait training;DME instruction;Stair training;Functional mobility training;Therapeutic exercise;Therapeutic activities;Balance training;Patient/family education    PT Goals (Current goals can be found in the Care Plan section)  Acute Rehab PT Goals Patient Stated Goal: to return home PT Goal Formulation: With patient Time For Goal Achievement: 04/22/23    Frequency Min 1X/week     Co-evaluation               AM-PAC PT "6 Clicks" Mobility  Outcome Measure Help needed turning from your back to your side while in a flat bed without using bedrails?: None Help needed moving from lying on your back to sitting on the side of a flat bed without using bedrails?: None Help needed moving to and from a bed to a chair (including a wheelchair)?: None Help needed standing up from a chair using your arms (e.g., wheelchair or bedside chair)?: None Help needed to walk in hospital room?: None Help needed climbing 3-5 steps with a railing? : A Little 6 Click Score: 23    End of Session Equipment Utilized During Treatment: Gait belt Activity Tolerance: Patient tolerated treatment well Patient left: in chair;with call bell/phone within reach;with chair alarm set   PT Visit Diagnosis: Difficulty in walking, not elsewhere classified (R26.2);Muscle weakness  (generalized) (M62.81)    Time: 1324-4010 PT Time Calculation (min) (ACUTE ONLY): 19 min   Charges:   PT Evaluation $PT Eval Moderate Complexity: 1 Mod   PT General Charges $$ ACUTE PT VISIT: 1 Visit       Tenaya Hilyer H. Manson Passey, PT, DPT, NCS 04/08/23, 10:06 PM [phone]

## 2023-04-08 NOTE — Discharge Summary (Signed)
Physician Discharge Summary  Jack White WUJ:[id number] DOB: 10-Jan-1941 DOA: 04/07/2023  PCP: Kandyce Rud, MD  Admit date: 04/07/2023 Discharge date: 04/08/2023  Admitted From: Home Disposition:  Home  Recommendations for Outpatient Follow-up:  Follow up with PCP in 1-2 weeks   Home Health:No Equipment/Devices:None  Discharge Condition:Stable  CODE STATUS:FULL  Diet recommendation: Soft/bland  Brief/Interim Summary: 83 y.o. male with medical history significant of Parkinson disease to the ED with several days of nausea vomiting and loose stools associated with abdominal pain.  On the day of presentation sat on the toilet to urinate and felt very weak and could not get back up.  EMS called.   On presentation patient's only complaint is of severe nausea and poor p.o. intake.  No evidence of GI blood loss.  Feels substantially weak and tired off baseline.  Vital signs stable.  CT abdomen pelvis performed without acute findings to explain symptoms.  Laboratory investigation overall reassuring.  Monitored on IVF, liquid diet, antiemetics.  Symptoms improved.  Tolerating soft diet on day of dc without N/V/abd pain.  Stable for dc  I recommend continuation of outpatient PT services post discharge for approximately 6 weeks.  Patient will need to return to his primary care for a new prescription as we are limited to [facility] rehab services for outpatient PT referrals.  Explained to patient and daughter via phone.  They are in agreement.   Discharge Diagnoses:  Principal Problem:   Acute gastroenteritis Active Problems:   Intractable nausea and vomiting   Intractable nausea and vomiting Acute gastroenteritis Suspect viral etiology.  No other clear identifiable cause on imaging or laboratory investigation to explain symptoms.  Tolerating soft diet on day of dc. Plan: Discharge home.  Soft/bland diet recommended.  PRN zofran prescribed.  Discharge Instructions  Discharge  Instructions     Diet - low sodium heart healthy   Complete by: As directed    Increase activity slowly   Complete by: As directed       Allergies as of 04/08/2023       Reactions   Citrus    Dilaudid [hydromorphone Hcl] Other (See Comments)   Respiratory arrest   Tomato Other (See Comments)   GI distress, respiratory distress        Medication List     STOP taking these medications    celecoxib 200 MG capsule Commonly known as: CELEBREX   clonazePAM 0.5 MG tablet Commonly known as: KLONOPIN   hydrOXYzine 10 MG tablet Commonly known as: ATARAX   lisinopril 2.5 MG tablet Commonly known as: ZESTRIL   Melatonin 10 MG Caps   metoprolol tartrate 50 MG tablet Commonly known as: LOPRESSOR   metroNIDAZOLE 500 MG tablet Commonly known as: Flagyl   nortriptyline 50 MG capsule Commonly known as: PAMELOR   zolpidem 5 MG tablet Commonly known as: AMBIEN       TAKE these medications    aspirin 81 MG tablet Take 81 mg by mouth daily.   atorvastatin 10 MG tablet Commonly known as: LIPITOR Take 5 mg by mouth at bedtime.   carbamazepine 200 MG 12 hr tablet Commonly known as: TEGRETOL XR Take 200 mg by mouth in the morning, at noon, in the evening, and at bedtime.   carbidopa-levodopa 25-100 MG tablet Commonly known as: SINEMET IR Take 1 tablet by mouth 3 (three) times daily.   doxazosin 4 MG tablet Commonly known as: CARDURA Take 1 tablet by mouth daily.   DULoxetine 60 MG capsule Commonly  known as: CYMBALTA Take 60 mg by mouth daily.   ergocalciferol 1.25 MG (50000 UT) capsule Commonly known as: VITAMIN D2 Take 50,000 Units by mouth once a week.   fluticasone 50 MCG/ACT nasal spray Commonly known as: FLONASE Place 2 sprays into both nostrils daily.   gabapentin 300 MG capsule Commonly known as: NEURONTIN Take 300 mg by mouth at bedtime. What changed: Another medication with the same name was removed. Continue taking this medication, and follow  the directions you see here.   hydrocortisone cream 1 % Apply topically 3 (three) times daily.   metFORMIN 1000 MG tablet Commonly known as: GLUCOPHAGE Take 1,000 mg by mouth 2 (two) times daily with a meal.   metoprolol succinate 25 MG 24 hr tablet Commonly known as: TOPROL-XL Take 1 tablet by mouth daily.   montelukast 10 MG tablet Commonly known as: SINGULAIR Take 10 mg by mouth at bedtime.   omeprazole 20 MG capsule Commonly known as: PRILOSEC Take 1 capsule by mouth daily.   ondansetron 4 MG tablet Commonly known as: ZOFRAN Take 1 tablet (4 mg total) by mouth every 6 (six) hours as needed for nausea.   tamsulosin 0.4 MG Caps capsule Commonly known as: FLOMAX Take 0.4 mg by mouth daily after breakfast.        Allergies  Allergen Reactions   Citrus    Dilaudid [Hydromorphone Hcl] Other (See Comments)    Respiratory arrest   Tomato Other (See Comments)    GI distress, respiratory distress    Consultations: None   Procedures/Studies: DG Chest Port 1 View Result Date: 04/07/2023 CLINICAL DATA:  83 year old male with history of sepsis. EXAM: PORTABLE CHEST 1 VIEW COMPARISON:  Chest x-ray 05/29/2014. FINDINGS: Lung volumes are low. No confluent consolidative airspace disease. No pleural effusions. Bibasilar opacities may reflect areas of mild subsegmental atelectasis and/or scarring. No pneumothorax. No evidence of pulmonary edema. Heart size is borderline enlarged. Upper mediastinal contours are distorted by patient positioning. IMPRESSION: 1. Low lung volumes without radiographic evidence of acute cardiopulmonary disease. Electronically Signed   By: Trudie Reed M.D.   On: 04/07/2023 05:34   CT ABDOMEN PELVIS W CONTRAST Result Date: 04/07/2023 CLINICAL DATA:  84 year old male with history of acute onset of nonlocalized abdominal pain. EXAM: CT ABDOMEN AND PELVIS WITH CONTRAST TECHNIQUE: Multidetector CT imaging of the abdomen and pelvis was performed using the  standard protocol following bolus administration of intravenous contrast. RADIATION DOSE REDUCTION: This exam was performed according to the departmental dose-optimization program which includes automated exposure control, adjustment of the mA and/or kV according to patient size and/or use of iterative reconstruction technique. CONTRAST:  OMNIPAQUE IOHEXOL 300 MG/ML  SOLN COMPARISON:  CT of the abdomen and pelvis 10/21/2014. FINDINGS: Lower chest: Mild scarring in the lung bases bilaterally. Atherosclerotic calcifications in the descending thoracic aorta. Hepatobiliary: No suspicious cystic or solid hepatic lesions. No intra or extrahepatic biliary ductal dilatation. Status post cholecystectomy. Pancreas: No pancreatic mass. No pancreatic ductal dilatation. No pancreatic or peripancreatic fluid collections or inflammatory changes. Spleen: Unremarkable. Adrenals/Urinary Tract: Low-attenuation lesions in the right kidney compatible with simple cysts, largest of which is exophytic extending off the posterior aspect of the interpolar region measuring up to 3.3 cm in diameter (no imaging follow-up recommended). No aggressive appearing renal lesions. No hydroureteronephrosis. Urinary bladder is unremarkable in appearance. Bilateral adrenal glands are normal in appearance. Stomach/Bowel: The appearance of the stomach is normal. No pathologic dilatation of small bowel or colon. Numerous colonic diverticula are  noted, without surrounding inflammatory changes to suggest an acute diverticulitis at this time. Postoperative changes of partial bowel resection are noted, with a suture line in the right lower quadrant from prior colostomy reversal. The appendix is not confidently identified and may be surgically absent. Regardless, there are no inflammatory changes noted adjacent to the cecum to suggest the presence of an acute appendicitis at this time. Vascular/Lymphatic: Atherosclerosis in the abdominal aorta and pelvic  vasculature, without evidence of aneurysm or dissection. No lymphadenopathy noted in the abdomen or pelvis. Reproductive: Prostate gland and seminal vesicles are unremarkable in appearance. Other: Small epigastric ventral hernia containing a small amount of omental fat. No significant volume of ascites. No pneumoperitoneum. Musculoskeletal: Bilateral pars defects are noted at L5 with 7 mm of anterolisthesis of L5 upon S1. There are no aggressive appearing lytic or blastic lesions noted in the visualized portions of the skeleton. IMPRESSION: 1. No acute findings are noted in the abdomen or pelvis to account for the patient's symptoms. 2. Colonic diverticulosis without evidence of acute diverticulitis at this time. 3. Aortic atherosclerosis. 4. 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 5. Small epigastric ventral hernia containing a small amount of omental fat. No evidence of associated bowel incarceration or obstruction at this time. Aortic Atherosclerosis (ICD10-I70.0). Electronically Signed   By: Trudie Reed M.D.   On: 04/07/2023 05:33   CT Cervical Spine Wo Contrast Result Date: 04/07/2023 CLINICAL DATA:  83 year old male with headache, syncope. EXAM: CT CERVICAL SPINE WITHOUT CONTRAST TECHNIQUE: Multidetector CT imaging of the cervical spine was performed without intravenous contrast. Multiplanar CT image reconstructions were also generated. RADIATION DOSE REDUCTION: This exam was performed according to the departmental dose-optimization program which includes automated exposure control, adjustment of the mA and/or kV according to patient size and/or use of iterative reconstruction technique. COMPARISON:  Head CT today. FINDINGS: Alignment: Mild straightening of cervical lordosis. Cervicothoracic junction alignment is within normal limits. Bilateral posterior element alignment is within normal limits. Skull base and vertebrae: Bone mineralization is within normal limits for  age. Visualized skull base is intact. No atlanto-occipital dissociation. C1 and C2 appear intact and aligned. No acute osseous abnormality identified. Soft tissues and spinal canal: No prevertebral fluid or swelling. No visible canal hematoma. Bilateral cervical carotid calcified atherosclerosis. Otherwise negative noncontrast neck soft tissues. Disc levels:  Congenital appearing non segmentation of C2-C3. Superimposed cervical spine degeneration which is bulky at C5-C6 and C6-C7. Spinal stenosis at both of those levels, moderate to severe at C5-C6 (series 6, image 65). Bulky upper cervical facet arthropathy. Upper chest: Visible upper thoracic levels appear intact. Negative lung apices. IMPRESSION: 1. No acute traumatic injury identified in the cervical spine. 2. 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 Electronically Signed   By: Odessa Fleming M.D.   On: 04/07/2023 04:03   CT Head Wo Contrast Result Date: 04/07/2023 CLINICAL DATA:  83 year old male with headache, syncope. EXAM: CT HEAD WITHOUT CONTRAST TECHNIQUE: Contiguous axial images were obtained from the base of the skull through the vertex without intravenous contrast. RADIATION DOSE REDUCTION: This exam was performed according to the departmental dose-optimization program which includes automated exposure control, adjustment of the mA and/or kV according to patient size and/or use of iterative reconstruction technique. COMPARISON:  Brain MRI 02/03/2022.  Head CT 12/09/2007. FINDINGS: Brain: Stable cerebral volume. No midline shift, ventriculomegaly, mass effect, evidence of  mass lesion, intracranial hemorrhage or evidence of cortically based acute infarction. Gray-white differentiation is stable and largely normal for age throughout the brain. Minimal to mild chronic white matter changes. Vascular: Calcified atherosclerosis at the skull  base. No suspicious intracranial vascular hyperdensity. Skull: No acute osseous abnormality identified. Sinuses/Orbits: Chronic paranasal sinus mucoperiosteal thickening appears stable to improved. Tympanic cavities and mastoids are well aerated. Other: Chronic left posterior scalp convexity scarring. No acute orbit or scalp soft tissue finding. IMPRESSION: 1. No acute intracranial abnormality. 2. Negative for age non contrast CT appearance of the brain. Electronically Signed   By: Odessa Fleming M.D.   On: 04/07/2023 04:00      Subjective: Seen and examined on day of discharge.  Stable, no distress.  Tolerating PO intake.  Appropriate for discharge home.  Discharge Exam: Vitals:   04/08/23 0624 04/08/23 0814  BP: (!) 120/44 (!) 109/47  Pulse: 61 60  Resp: 18 18  Temp: (!) 97.4 F (36.3 C) (!) 97.5 F (36.4 C)  SpO2: 98% 98%   Vitals:   04/07/23 1707 04/07/23 2131 04/08/23 0624 04/08/23 0814  BP: 138/63 (!) 142/71 (!) 120/44 (!) 109/47  Pulse: 61 74 61 60  Resp: 18 18 18 18   Temp: 98 F (36.7 C) 98.1 F (36.7 C) (!) 97.4 F (36.3 C) (!) 97.5 F (36.4 C)  TempSrc:  Oral    SpO2: 99% 99% 98% 98%  Weight:      Height:        General: Pt is alert, awake, not in acute distress Cardiovascular: RRR, S1/S2 +, no rubs, no gallops Respiratory: CTA bilaterally, no wheezing, no rhonchi Abdominal: Soft, NT, ND, bowel sounds + Extremities: no edema, no cyanosis    The results of significant diagnostics from this hospitalization (including imaging, microbiology, ancillary and laboratory) are listed below for reference.     Microbiology: Recent Results (from the past 240 hours)  Resp panel by RT-PCR (RSV, Flu A&B, Covid) Anterior Nasal Swab     Status: None   Collection Time: 04/07/23  1:32 AM   Specimen: Anterior Nasal Swab  Result Value Ref Range Status   SARS Coronavirus 2 by RT PCR NEGATIVE NEGATIVE Final    Comment: (NOTE) SARS-CoV-2 target nucleic acids are NOT DETECTED.  The  SARS-CoV-2 RNA is generally detectable in upper respiratory specimens during the acute phase of infection. The lowest concentration of SARS-CoV-2 viral copies this assay can detect is 138 copies/mL. A negative result does not preclude SARS-Cov-2 infection and should not be used as the sole basis for treatment or other patient management decisions. A negative result may occur with  improper specimen collection/handling, submission of specimen other than nasopharyngeal swab, presence of viral mutation(s) within the areas targeted by this assay, and inadequate number of viral copies(<138 copies/mL). A negative result must be combined with clinical observations, patient history, and epidemiological information. The expected result is Negative.  Fact Sheet for Patients:  BloggerCourse.com  Fact Sheet for Healthcare Providers:  SeriousBroker.it  This test is no t yet approved or cleared by the [place] FDA and  has been authorized for detection and/or diagnosis of SARS-CoV-2 by FDA under an Emergency Use Authorization (EUA). This EUA will remain  in effect (meaning this test can be used) for the duration of the COVID-19 declaration under Section 564(b)(1) of the Act, 21 U.S.C.section 360bbb-3(b)(1), unless the authorization is terminated  or revoked sooner.       Influenza A by PCR NEGATIVE NEGATIVE Final  Influenza B by PCR NEGATIVE NEGATIVE Final    Comment: (NOTE) The Xpert Xpress SARS-CoV-2/FLU/RSV plus assay is intended as an aid in the diagnosis of influenza from Nasopharyngeal swab specimens and should not be used as a sole basis for treatment. Nasal washings and aspirates are unacceptable for Xpert Xpress SARS-CoV-2/FLU/RSV testing.  Fact Sheet for Patients: BloggerCourse.com  Fact Sheet for Healthcare Providers: SeriousBroker.it  This test is not yet approved or  cleared by the [place] FDA and has been authorized for detection and/or diagnosis of SARS-CoV-2 by FDA under an Emergency Use Authorization (EUA). This EUA will remain in effect (meaning this test can be used) for the duration of the COVID-19 declaration under Section 564(b)(1) of the Act, 21 U.S.C. section 360bbb-3(b)(1), unless the authorization is terminated or revoked.     Resp Syncytial Virus by PCR NEGATIVE NEGATIVE Final    Comment: (NOTE) Fact Sheet for Patients: BloggerCourse.com  Fact Sheet for Healthcare Providers: SeriousBroker.it  This test is not yet approved or cleared by the [place] FDA and has been authorized for detection and/or diagnosis of SARS-CoV-2 by FDA under an Emergency Use Authorization (EUA). This EUA will remain in effect (meaning this test can be used) for the duration of the COVID-19 declaration under Section 564(b)(1) of the Act, 21 U.S.C. section 360bbb-3(b)(1), unless the authorization is terminated or revoked.  Performed at [facility], [address] [place], [place] [address]   Blood Culture (routine x 2)     Status: None (Preliminary result)   Collection Time: 04/07/23  2:37 AM   Specimen: BLOOD LEFT ARM  Result Value Ref Range Status   Specimen Description BLOOD LEFT ARM  Final   Special Requests   Final    BOTTLES DRAWN AEROBIC AND ANAEROBIC Blood Culture results may not be optimal due to an inadequate volume of blood received in culture bottles   Culture   Final    NO GROWTH 1 DAY Performed at [facility], [address], [place], [place] [address]    Report Status PENDING  Incomplete  Blood Culture (routine x 2)     Status: None (Preliminary result)   Collection Time: 04/07/23  2:38 AM   Specimen: BLOOD RIGHT ARM  Result Value Ref Range Status   Specimen Description BLOOD RIGHT ARM  Final   Special Requests   Final    BOTTLES DRAWN AEROBIC AND  ANAEROBIC Blood Culture results may not be optimal due to an inadequate volume of blood received in culture bottles   Culture   Final    NO GROWTH 1 DAY Performed at [facility], [address] [place], [place] [address]    Report Status PENDING  Incomplete     Labs: BNP (last 3 results) No results for input(s): "BNP" in the last 8760 hours. Basic Metabolic Panel: Recent Labs  Lab 04/07/23 0132 04/08/23 0722  NA 135 134*  K 5.2* 5.0  CL 103 107  CO2 18* 21*  GLUCOSE 130* 90  BUN 47* 33*  CREATININE 1.46* 1.29*  CALCIUM 8.4* 7.9*   Liver Function Tests: Recent Labs  Lab 04/07/23 0132  AST 22  ALT 8  ALKPHOS 97  BILITOT 0.5  PROT 6.7  ALBUMIN 3.2*   Recent Labs  Lab 04/07/23 0132  LIPASE 28   No results for input(s): "AMMONIA" in the last 168 hours. CBC: Recent Labs  Lab 04/07/23 0132 04/08/23 0722  WBC 6.0 4.6  NEUTROABS 4.2  --  HGB 11.1* 9.6*  HCT 34.5* 29.2*  MCV 98.6 96.1  PLT 154 119*   Cardiac Enzymes: Recent Labs  Lab 04/07/23 0132  CKTOTAL 32*   BNP: Invalid input(s): "POCBNP" CBG: Recent Labs  Lab 04/07/23 1149 04/08/23 1150  GLUCAP 152* 157*   D-Dimer No results for input(s): "DDIMER" in the last 72 hours. Hgb A1c No results for input(s): "HGBA1C" in the last 72 hours. Lipid Profile No results for input(s): "CHOL", "HDL", "LDLCALC", "TRIG", "CHOLHDL", "LDLDIRECT" in the last 72 hours. Thyroid function studies Recent Labs    04/07/23 0132  TSH 9.129*   Anemia work up No results for input(s): "VITAMINB12", "FOLATE", "FERRITIN", "TIBC", "IRON", "RETICCTPCT" in the last 72 hours. Urinalysis    Component Value Date/Time   COLORURINE YELLOW (A) 04/07/2023 0405   APPEARANCEUR CLEAR (A) 04/07/2023 0405   APPEARANCEUR Clear 06/22/2014 2230   LABSPEC 1.018 04/07/2023 0405   LABSPEC 1.028 06/22/2014 2230   PHURINE 5.0 04/07/2023 0405   GLUCOSEU NEGATIVE 04/07/2023 0405   GLUCOSEU Negative 06/22/2014 2230   HGBUR  NEGATIVE 04/07/2023 0405   BILIRUBINUR NEGATIVE 04/07/2023 0405   BILIRUBINUR Negative 06/22/2014 2230   KETONESUR NEGATIVE 04/07/2023 0405   PROTEINUR NEGATIVE 04/07/2023 0405   NITRITE NEGATIVE 04/07/2023 0405   LEUKOCYTESUR NEGATIVE 04/07/2023 0405   LEUKOCYTESUR Negative 06/22/2014 2230   Sepsis Labs Recent Labs  Lab 04/07/23 0132 04/08/23 0722  WBC 6.0 4.6   Microbiology Recent Results (from the past 240 hours)  Resp panel by RT-PCR (RSV, Flu A&B, Covid) Anterior Nasal Swab     Status: None   Collection Time: 04/07/23  1:32 AM   Specimen: Anterior Nasal Swab  Result Value Ref Range Status   SARS Coronavirus 2 by RT PCR NEGATIVE NEGATIVE Final    Comment: (NOTE) SARS-CoV-2 target nucleic acids are NOT DETECTED.  The SARS-CoV-2 RNA is generally detectable in upper respiratory specimens during the acute phase of infection. The lowest concentration of SARS-CoV-2 viral copies this assay can detect is 138 copies/mL. A negative result does not preclude SARS-Cov-2 infection and should not be used as the sole basis for treatment or other patient management decisions. A negative result may occur with  improper specimen collection/handling, submission of specimen other than nasopharyngeal swab, presence of viral mutation(s) within the areas targeted by this assay, and inadequate number of viral copies(<138 copies/mL). A negative result must be combined with clinical observations, patient history, and epidemiological information. The expected result is Negative.  Fact Sheet for Patients:  BloggerCourse.com  Fact Sheet for Healthcare Providers:  SeriousBroker.it  This test is no t yet approved or cleared by the [place] FDA and  has been authorized for detection and/or diagnosis of SARS-CoV-2 by FDA under an Emergency Use Authorization (EUA). This EUA will remain  in effect (meaning this test can be used) for the duration  of the COVID-19 declaration under Section 564(b)(1) of the Act, 21 U.S.C.section 360bbb-3(b)(1), unless the authorization is terminated  or revoked sooner.       Influenza A by PCR NEGATIVE NEGATIVE Final   Influenza B by PCR NEGATIVE NEGATIVE Final    Comment: (NOTE) The Xpert Xpress SARS-CoV-2/FLU/RSV plus assay is intended as an aid in the diagnosis of influenza from Nasopharyngeal swab specimens and should not be used as a sole basis for treatment. Nasal washings and aspirates are unacceptable for Xpert Xpress SARS-CoV-2/FLU/RSV testing.  Fact Sheet for Patients: BloggerCourse.com  Fact Sheet for Healthcare Providers: SeriousBroker.it  This test is not yet  approved or cleared by the [place] and has been authorized for detection and/or diagnosis of SARS-CoV-2 by FDA under an Emergency Use Authorization (EUA). This EUA will remain in effect (meaning this test can be used) for the duration of the COVID-19 declaration under Section 564(b)(1) of the Act, 21 U.S.C. section 360bbb-3(b)(1), unless the authorization is terminated or revoked.     Resp Syncytial Virus by PCR NEGATIVE NEGATIVE Final    Comment: (NOTE) Fact Sheet for Patients: BloggerCourse.com  Fact Sheet for Healthcare Providers: SeriousBroker.it  This test is not yet approved or cleared by the [place] FDA and has been authorized for detection and/or diagnosis of SARS-CoV-2 by FDA under an Emergency Use Authorization (EUA). This EUA will remain in effect (meaning this test can be used) for the duration of the COVID-19 declaration under Section 564(b)(1) of the Act, 21 U.S.C. section 360bbb-3(b)(1), unless the authorization is terminated or revoked.  Performed at [facility], [address], [place], [place] [address]   Blood Culture (routine x 2)     Status: None (Preliminary result)    Collection Time: 04/07/23  2:37 AM   Specimen: BLOOD LEFT ARM  Result Value Ref Range Status   Specimen Description BLOOD LEFT ARM  Final   Special Requests   Final    BOTTLES DRAWN AEROBIC AND ANAEROBIC Blood Culture results may not be optimal due to an inadequate volume of blood received in culture bottles   Culture   Final    NO GROWTH 1 DAY Performed at [facility], [address], [place], [place] [address]    Report Status PENDING  Incomplete  Blood Culture (routine x 2)     Status: None (Preliminary result)   Collection Time: 04/07/23  2:38 AM   Specimen: BLOOD RIGHT ARM  Result Value Ref Range Status   Specimen Description BLOOD RIGHT ARM  Final   Special Requests   Final    BOTTLES DRAWN AEROBIC AND ANAEROBIC Blood Culture results may not be optimal due to an inadequate volume of blood received in culture bottles   Culture   Final    NO GROWTH 1 DAY Performed at [facility], [address] [place], [place] [address]    Report Status PENDING  Incomplete     Time coordinating discharge: Over 30 minutes  SIGNED:   Tresa Moore, MD  [facility] 04/08/2023, 1:47 PM Pager   If 7PM-7AM, please contact night-coverage

## 2023-04-08 NOTE — Plan of Care (Signed)

## 2023-04-08 NOTE — Evaluation (Signed)
Occupational Therapy Evaluation Patient Details Name: Jack White MRN: [id number] DOB: Jul 17, 1940 Today's Date: 04/08/2023   History of Present Illness   History of Present Illness: Jack White is a 83 y.o. male with medical history significant of Parkinson disease to the ED with several days of nausea vomiting and loose stools associated with abdominal pai and weakness.     Clinical Impressions [name] was seen for OT evaluation this date. Prior to hospital admission, pt was MOD I using RW. Pt lives alone with daughter available for transportation and IADLs. Pt currently requires SUPERVISION + RW for ADL t/f ~150 ft. SUP toileting and standing grooming tasks. IND for LB access in sitting. Educated on ECS and falls prevention strategies, appears near baseline, will sign off. Upon hospital discharge, recommend no OT follow up.    If plan is discharge home, recommend the following:   Help with stairs or ramp for entrance;Assistance with cooking/housework     Functional Status Assessment   Patient has had a recent decline in their functional status and demonstrates the ability to make significant improvements in function in a reasonable and predictable amount of time.     Equipment Recommendations   None recommended by OT     Recommendations for Other Services         Precautions/Restrictions   Precautions Precautions: None Restrictions Weight Bearing Restrictions Per Provider Order: No     Mobility Bed Mobility Overal bed mobility: Modified Independent                  Transfers Overall transfer level: Modified independent Equipment used: Rolling walker (2 wheels)                      Balance Overall balance assessment: Mild deficits observed, not formally tested                                         ADL either performed or assessed with clinical judgement   ADL Overall ADL's : Needs assistance/impaired                                        General ADL Comments: SUPERVISION + RW for ADL t/f ~150 ft. SUP toileting and standing grooming tasks. IND for LB access in sitting              Praxis         Pertinent Vitals/Pain Pain Assessment Pain Assessment: No/denies pain     Extremity/Trunk Assessment Upper Extremity Assessment Upper Extremity Assessment: Overall WFL for tasks assessed   Lower Extremity Assessment Lower Extremity Assessment: Overall WFL for tasks assessed       Communication Communication Communication: Impaired Factors Affecting Communication: Hearing impaired   Cognition Arousal: Alert Behavior During Therapy: WFL for tasks assessed/performed Cognition: No apparent impairments                               Following commands: Intact       Cueing  General Comments          Exercises     Shoulder Instructions      Home Living Family/patient expects to be discharged to:: Private residence Living Arrangements: Alone Available  Help at Discharge: Family;Available PRN/intermittently Type of Home: Mobile home Home Access: Ramped entrance     Home Layout: One level               Home Equipment: Rolling Walker (2 wheels)          Prior Functioning/Environment Prior Level of Function : Independent/Modified Independent                    OT Problem List: Decreased range of motion;Decreased activity tolerance;Impaired balance (sitting and/or standing)   OT Treatment/Interventions:        OT Goals(Current goals can be found in the care plan section)   Acute Rehab OT Goals Patient Stated Goal: to go home OT Goal Formulation: With patient Time For Goal Achievement: 04/08/23 Potential to Achieve Goals: Good   OT Frequency:       Co-evaluation              AM-PAC OT "6 Clicks" Daily Activity     Outcome Measure Help from another person eating meals?: None Help from another person taking  care of personal grooming?: None Help from another person toileting, which includes using toliet, bedpan, or urinal?: None Help from another person bathing (including washing, rinsing, drying)?: A Little Help from another person to put on and taking off regular upper body clothing?: None Help from another person to put on and taking off regular lower body clothing?: None 6 Click Score: 23   End of Session Equipment Utilized During Treatment: Rolling walker (2 wheels)  Activity Tolerance: Patient tolerated treatment well Patient left: in bed;with call bell/phone within reach  OT Visit Diagnosis: Unsteadiness on feet (R26.81)                Time: 1050-1106 OT Time Calculation (min): 16 min Charges:  OT General Charges $OT Visit: 1 Visit OT Evaluation $OT Eval Low Complexity: 1 Low  Kathie Dike, M.S. OTR/L  04/08/23, 12:36 PM  ascom [phone]

## 2023-04-08 NOTE — Plan of Care (Signed)
  Problem: Education: Goal: Knowledge of General Education information will improve Description: Including pain rating scale, medication(s)/side effects and non-pharmacologic comfort measures Outcome: Progressing   Problem: Health Behavior/Discharge Planning: Goal: Ability to manage health-related needs will improve Outcome: Progressing   Problem: Pain Managment: Goal: General experience of comfort will improve and/or be controlled Outcome: Progressing

## 2023-04-08 NOTE — TOC CM/SW Note (Signed)
Transition of Care [facility]) - Inpatient Brief Assessment   Patient Details  Name: Jack White MRN: [id number] Date of Birth: 1940/10/08  Transition of Care [facility]) CM/SW Contact:    Allena Katz, LCSW Phone Number: 04/08/2023, 12:09 PM   Clinical Narrative:    Transition of Care Asessment: Insurance and Status: Insurance coverage has been reviewed Patient has primary care physician: Yes Home environment has been reviewed: lives with spouse Prior level of function:: ambulating with RW, going to [facility] and wants to continue. Prior/Current Home Services: No current home services Social Drivers of Health Review: SDOH reviewed no interventions necessary Readmission risk has been reviewed: Yes Transition of care needs: no transition of care needs at this time

## 2023-04-11 DIAGNOSIS — M6281 Muscle weakness (generalized): Secondary | ICD-10-CM | POA: Diagnosis not present

## 2023-04-11 DIAGNOSIS — R2681 Unsteadiness on feet: Secondary | ICD-10-CM | POA: Diagnosis not present

## 2023-04-12 LAB — CULTURE, BLOOD (ROUTINE X 2)
Culture: NO GROWTH
Culture: NO GROWTH

## 2023-04-14 DIAGNOSIS — N1831 Chronic kidney disease, stage 3a: Secondary | ICD-10-CM | POA: Diagnosis not present

## 2023-04-14 DIAGNOSIS — K529 Noninfective gastroenteritis and colitis, unspecified: Secondary | ICD-10-CM | POA: Diagnosis not present

## 2023-04-14 DIAGNOSIS — G20A1 Parkinson's disease without dyskinesia, without mention of fluctuations: Secondary | ICD-10-CM | POA: Diagnosis not present

## 2023-04-14 DIAGNOSIS — E1122 Type 2 diabetes mellitus with diabetic chronic kidney disease: Secondary | ICD-10-CM | POA: Diagnosis not present

## 2023-04-14 DIAGNOSIS — E7800 Pure hypercholesterolemia, unspecified: Secondary | ICD-10-CM | POA: Diagnosis not present

## 2023-04-14 DIAGNOSIS — D649 Anemia, unspecified: Secondary | ICD-10-CM | POA: Diagnosis not present

## 2023-04-14 DIAGNOSIS — Z79899 Other long term (current) drug therapy: Secondary | ICD-10-CM | POA: Diagnosis not present

## 2023-04-14 DIAGNOSIS — I10 Essential (primary) hypertension: Secondary | ICD-10-CM | POA: Diagnosis not present

## 2023-04-14 DIAGNOSIS — N1832 Chronic kidney disease, stage 3b: Secondary | ICD-10-CM | POA: Diagnosis not present

## 2023-04-18 DIAGNOSIS — M6281 Muscle weakness (generalized): Secondary | ICD-10-CM | POA: Diagnosis not present

## 2023-04-18 DIAGNOSIS — R2681 Unsteadiness on feet: Secondary | ICD-10-CM | POA: Diagnosis not present

## 2023-04-23 DIAGNOSIS — G20A1 Parkinson's disease without dyskinesia, without mention of fluctuations: Secondary | ICD-10-CM | POA: Diagnosis not present

## 2023-04-25 DIAGNOSIS — R2681 Unsteadiness on feet: Secondary | ICD-10-CM | POA: Diagnosis not present

## 2023-04-25 DIAGNOSIS — M6281 Muscle weakness (generalized): Secondary | ICD-10-CM | POA: Diagnosis not present

## 2023-05-02 DIAGNOSIS — R2681 Unsteadiness on feet: Secondary | ICD-10-CM | POA: Diagnosis not present

## 2023-05-02 DIAGNOSIS — M6281 Muscle weakness (generalized): Secondary | ICD-10-CM | POA: Diagnosis not present

## 2023-05-05 DIAGNOSIS — R2681 Unsteadiness on feet: Secondary | ICD-10-CM | POA: Diagnosis not present

## 2023-05-05 DIAGNOSIS — M6281 Muscle weakness (generalized): Secondary | ICD-10-CM | POA: Diagnosis not present

## 2023-05-09 DIAGNOSIS — R2681 Unsteadiness on feet: Secondary | ICD-10-CM | POA: Diagnosis not present

## 2023-05-09 DIAGNOSIS — M6281 Muscle weakness (generalized): Secondary | ICD-10-CM | POA: Diagnosis not present

## 2023-05-13 DIAGNOSIS — R2681 Unsteadiness on feet: Secondary | ICD-10-CM | POA: Diagnosis not present

## 2023-05-13 DIAGNOSIS — M6281 Muscle weakness (generalized): Secondary | ICD-10-CM | POA: Diagnosis not present

## 2023-05-15 DIAGNOSIS — M6281 Muscle weakness (generalized): Secondary | ICD-10-CM | POA: Diagnosis not present

## 2023-05-15 DIAGNOSIS — R2681 Unsteadiness on feet: Secondary | ICD-10-CM | POA: Diagnosis not present

## 2023-05-19 DIAGNOSIS — M6281 Muscle weakness (generalized): Secondary | ICD-10-CM | POA: Diagnosis not present

## 2023-05-19 DIAGNOSIS — R2681 Unsteadiness on feet: Secondary | ICD-10-CM | POA: Diagnosis not present

## 2023-05-22 DIAGNOSIS — R2681 Unsteadiness on feet: Secondary | ICD-10-CM | POA: Diagnosis not present

## 2023-05-22 DIAGNOSIS — M6281 Muscle weakness (generalized): Secondary | ICD-10-CM | POA: Diagnosis not present

## 2023-05-28 DIAGNOSIS — R2681 Unsteadiness on feet: Secondary | ICD-10-CM | POA: Diagnosis not present

## 2023-05-28 DIAGNOSIS — M6281 Muscle weakness (generalized): Secondary | ICD-10-CM | POA: Diagnosis not present

## 2023-06-05 DIAGNOSIS — G20A1 Parkinson's disease without dyskinesia, without mention of fluctuations: Secondary | ICD-10-CM | POA: Diagnosis not present

## 2023-06-05 DIAGNOSIS — G629 Polyneuropathy, unspecified: Secondary | ICD-10-CM | POA: Diagnosis not present

## 2023-06-05 DIAGNOSIS — R4189 Other symptoms and signs involving cognitive functions and awareness: Secondary | ICD-10-CM | POA: Diagnosis not present

## 2023-06-05 DIAGNOSIS — R519 Headache, unspecified: Secondary | ICD-10-CM | POA: Diagnosis not present

## 2023-06-05 DIAGNOSIS — R2689 Other abnormalities of gait and mobility: Secondary | ICD-10-CM | POA: Diagnosis not present

## 2023-06-05 DIAGNOSIS — M62838 Other muscle spasm: Secondary | ICD-10-CM | POA: Diagnosis not present

## 2023-06-05 DIAGNOSIS — G252 Other specified forms of tremor: Secondary | ICD-10-CM | POA: Diagnosis not present

## 2023-06-05 DIAGNOSIS — G500 Trigeminal neuralgia: Secondary | ICD-10-CM | POA: Diagnosis not present

## 2023-06-16 DIAGNOSIS — R399 Unspecified symptoms and signs involving the genitourinary system: Secondary | ICD-10-CM | POA: Diagnosis not present

## 2023-06-16 DIAGNOSIS — N3000 Acute cystitis without hematuria: Secondary | ICD-10-CM | POA: Diagnosis not present

## 2023-06-16 DIAGNOSIS — R8290 Unspecified abnormal findings in urine: Secondary | ICD-10-CM | POA: Diagnosis not present

## 2023-06-26 DIAGNOSIS — N1831 Chronic kidney disease, stage 3a: Secondary | ICD-10-CM | POA: Diagnosis not present

## 2023-06-26 DIAGNOSIS — E7800 Pure hypercholesterolemia, unspecified: Secondary | ICD-10-CM | POA: Diagnosis not present

## 2023-06-26 DIAGNOSIS — Z79899 Other long term (current) drug therapy: Secondary | ICD-10-CM | POA: Diagnosis not present

## 2023-06-26 DIAGNOSIS — R8290 Unspecified abnormal findings in urine: Secondary | ICD-10-CM | POA: Diagnosis not present

## 2023-06-26 DIAGNOSIS — E1122 Type 2 diabetes mellitus with diabetic chronic kidney disease: Secondary | ICD-10-CM | POA: Diagnosis not present

## 2023-07-01 DIAGNOSIS — I10 Essential (primary) hypertension: Secondary | ICD-10-CM | POA: Diagnosis not present

## 2023-07-01 DIAGNOSIS — R2681 Unsteadiness on feet: Secondary | ICD-10-CM | POA: Diagnosis not present

## 2023-07-01 DIAGNOSIS — E7800 Pure hypercholesterolemia, unspecified: Secondary | ICD-10-CM | POA: Diagnosis not present

## 2023-07-01 DIAGNOSIS — Z79899 Other long term (current) drug therapy: Secondary | ICD-10-CM | POA: Diagnosis not present

## 2023-07-01 DIAGNOSIS — N1832 Chronic kidney disease, stage 3b: Secondary | ICD-10-CM | POA: Diagnosis not present

## 2023-07-01 DIAGNOSIS — G20A1 Parkinson's disease without dyskinesia, without mention of fluctuations: Secondary | ICD-10-CM | POA: Diagnosis not present

## 2023-07-01 DIAGNOSIS — E1122 Type 2 diabetes mellitus with diabetic chronic kidney disease: Secondary | ICD-10-CM | POA: Diagnosis not present

## 2023-07-01 DIAGNOSIS — N1831 Chronic kidney disease, stage 3a: Secondary | ICD-10-CM | POA: Diagnosis not present

## 2023-07-01 DIAGNOSIS — D649 Anemia, unspecified: Secondary | ICD-10-CM | POA: Diagnosis not present

## 2023-07-04 ENCOUNTER — Encounter: Payer: Self-pay | Admitting: Neurology

## 2023-07-11 DIAGNOSIS — R29898 Other symptoms and signs involving the musculoskeletal system: Secondary | ICD-10-CM | POA: Diagnosis not present

## 2023-07-11 DIAGNOSIS — L84 Corns and callosities: Secondary | ICD-10-CM | POA: Diagnosis not present

## 2023-07-11 DIAGNOSIS — G20A1 Parkinson's disease without dyskinesia, without mention of fluctuations: Secondary | ICD-10-CM | POA: Diagnosis not present

## 2023-07-11 DIAGNOSIS — E1142 Type 2 diabetes mellitus with diabetic polyneuropathy: Secondary | ICD-10-CM | POA: Diagnosis not present

## 2023-07-14 NOTE — Progress Notes (Signed)
 Assessment/Plan:    1.  Parkinson's disease, by history  - The patient actually did not look that parkinsonian today, but it may be because he is on levodopa .  I looked at the [facility] records prior to starting levodopa , and they noted that he really did not have tremor, but noted that he was moderately rigid in arms and legs, which I did not see today, so perhaps that is because he is on levodopa .  He is really spreading the levodopa  too far apart, so I gave him instructions on how to take the levodopa .  He was told to take carbidopa /levodopa  25/100, 1 tablet at 8 AM/noon/4 PM.  He was told about potential interactions with protein, which he states that he did not know previously.  - We decided to go ahead and proceed with DaTscan.  -he is a [place] vet but has not gone through [place] for this.  We discussed VA services and VA connected disability, but patient states that his [place] records are not correct and states that he has never been to [place], so this may be a challenge for him.  - I agree with the physical therapy.  2.  Trigeminal neuralgia  - Patient is on fairly high-dose carbamazepine .  He is following with [facility].  Limiting this medication to as well as dose as possible would be beneficial, especially in this age group.  In addition, trigeminal neuralgia tends to wax and wane, and I often will take patients off of the medication when they are doing better, so as not to dose escalate.  He is no longer symptomatic and I think we could go ahead and start backing off on the dose.  Will decrease the carbamazepine  to 200 mg 3 times per day and hopefully be able to wean it further soon.  -DaTscan  -protein interaction with levodopa   3.  Diabetic peripheral neuropathy  - Contributes to gait instability.  - I agree with [facility] that we would like to avoid use of nortriptyline .  - Patient currently on gabapentin , 300 mg at bed.  I wonder if he is contributing to possible  myoclonus  4.  Myoclonus  - While he had some degree of tremor today, he really had more myoclonus than tremor  -gabapentin  in combination with renal insufficiency can contribute to myoclonus.  His gabapentin  was just increased on Friday, and he thought that the movements got worse since Friday.  His primary care is prescribing this.  I would probably recommend that we try to taper him off of the gabapentin .  He was already on Cymbalta  for the treatment of neuropathy.    Subjective:   Jack White was seen today in the movement disorders clinic for neurologic consultation at the request of Nestor Banter, MD.  The consultation is for the evaluation of Parkinsons.  Pt with daughter who supplements hx.  Patient previously a patient of [facility] neurology as well as [facility].  He first saw Dr. Walden Guise in December, 2023.  At that point in time, patient was being seen for 1 year history of facial pain, presumed to be trigeminal neuralgia.  Patient was started on carbamazepine  and worked to 200 mg twice per day.  When he was seen in July, 2024 the patient was complaining about tremor, felt consistent with essential tremor.  Patient was started on nortriptyline  and an EMG was scheduled.  Patient followed back up in September, 2024 and his nortriptyline  dose was increased and clonazepam was  added and he was referred to [facility].  Patient saw the [facility] movement disorder neurologist November 13, 2022.  It was felt that the patient had parkinsonism.  Patient was started on levodopa  and it was recommended that the patient stopped the nortriptyline , clonazepam and duloxetine .  Patient was last seen by [facility] neurology April 23, 2023.  Patient was doing better on low-dose levodopa .  They recommended against driving and told the patient he could follow-up here.  Patient did have a telemedicine visit with Dr. Walden Guise not long thereafter on June 05, 2023 and he restarted the clonazepam and increase the  levodopa  to 1 tablet 3-4 times per day.  Patient's carbamazepine  is now 200 mg 4 times per day.  He saw his primary care May 16 and social work consult was placed to figure out home health services and get him a walker.  Diabetic neuropathy appears to have caused a significant amount of gait instability.  He is on gabapentin  for some of the burning paresthesias.  His gabapentin  was increased on Friday.  He thinks that some of the jerking has increased since the gabapentin  was increased.  Regarding the trigeminal neuralgia, the patient states that the pain has been quite well-controlled for a long time and he very rarely if ever has facial pain.  If so, he will take a Tylenol  or Advil.  Current movement meds: carbidopa /levodopa  25/100, 8am/1pm/10pm   Specific Symptoms:  Tremor: Yes.  , started in both hands at same time.  He is naturally L handed but he reports he is ambidexterous now.  Shakes at rest and with us .  He will have "bad days" where he will start shaking all over and he can fall to the floor if he doesn't sit Family hx of similar:  nephew with Parkinsons Disease  Voice: sometimes it feels like he has a frog in the voice.  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    Acting out dreams:  No. Wet Pillows: No. Postural symptoms:  Yes.  , but many related to diabetic PN.  He hasn't had a fall since Christmas because he has been in PT since Christmas.  He is starting [employer] soon  Falls?  None since Christmas Bradykinesia symptoms: shuffling gait, difficulty getting out of a chair, and difficulty regaining balance Loss of smell:  No. Loss of taste:  No. Urinary Incontinence: has urgency Difficulty Swallowing:  No. Trouble with ADL's:  No.  Trouble buttoning clothing: No. Depression:  No. Memory changes:  "my memory is pitiful;"  has trouble with names and has trouble with word finding; step daughter lives with him but she only lives with him  for her own finances (daughter states that she is not dependable and is "in and out" of the house") Hallucinations:  No.  visual distortions: No. N/V:  No. Lightheaded:  Yes.  , had a dizzy spell last week and he sat on the floor to avoid a fall  Syncope: No. Diplopia:  No. Dyskinesia:  No. Prior exposure to reglan/antipsychotics: No.  N  ALLERGIES:   Allergies  Allergen Reactions   Citrus    Dilaudid [Hydromorphone Hcl] Other (See Comments)    Respiratory arrest   Tomato Other (See Comments)    GI distress, respiratory distress    CURRENT MEDICATIONS:  Current Meds  Medication Sig   aspirin  81 MG tablet Take 81 mg by mouth daily.   atorvastatin  (LIPITOR) 10  MG tablet Take 5 mg by mouth at bedtime.   carbamazepine  (TEGRETOL  XR) 200 MG 12 hr tablet Take 200 mg by mouth in the morning, at noon, in the evening, and at bedtime.   carbidopa -levodopa  (SINEMET  IR) 25-100 MG tablet Take 1 tablet by mouth 3 (three) times daily.   doxazosin  (CARDURA ) 4 MG tablet Take 1 tablet by mouth daily.   DULoxetine  (CYMBALTA ) 60 MG capsule Take 60 mg by mouth daily.   ergocalciferol (VITAMIN D2) 1.25 MG (50000 UT) capsule Take 50,000 Units by mouth once a week.   fluticasone  (FLONASE ) 50 MCG/ACT nasal spray Place 2 sprays into both nostrils daily.   gabapentin  (NEURONTIN ) 300 MG capsule Take 300 mg by mouth 2 (two) times daily.   hydrocortisone  cream 1 % Apply topically 3 (three) times daily.   metFORMIN (GLUCOPHAGE) 1000 MG tablet Take 1,000 mg by mouth 2 (two) times daily with a meal.   metoprolol  succinate (TOPROL -XL) 25 MG 24 hr tablet Take 1 tablet by mouth daily.   montelukast  (SINGULAIR ) 10 MG tablet Take 10 mg by mouth at bedtime.   omeprazole (PRILOSEC) 20 MG capsule Take 1 capsule by mouth daily.   ondansetron  (ZOFRAN ) 4 MG tablet Take 1 tablet (4 mg total) by mouth every 6 (six) hours as needed for nausea.   tamsulosin  (FLOMAX ) 0.4 MG CAPS capsule Take 0.4 mg by mouth daily after  breakfast.     Objective:   VITALS:   Vitals:   07/16/23 1017  BP: 135/76  Pulse: 71  SpO2: 98%  Weight: 220 lb (99.8 kg)  Height: 6' (1.829 m)    GEN:  The patient appears stated age and is in NAD. HEENT:  Normocephalic, atraumatic.  The mucous membranes are moist. The superficial temporal arteries are without ropiness or tenderness. CV:  RRR Lungs:  CTAB Neck/HEME:  There are no carotid bruits bilaterally.  Neurological examination:  Orientation: The patient is alert and oriented x3.  Cranial nerves: There is good facial symmetry. Extraocular muscles are intact. The visual fields are full to confrontational testing. The speech is fluent and clear. Soft palate rises symmetrically and there is no tongue deviation. Hearing is intact to conversational tone. Sensation: Sensation is intact to light and pinprick throughout (facial, trunk, extremities).  Sensation is markedly decreased in a distal fashion. There is no extinction with double simultaneous stimulation. There is no sensory dermatomal level identified. Motor: Strength is 5/5 in the bilateral upper and lower extremities.   Shoulder shrug is equal and symmetric.  There is no pronator drift. Deep tendon reflexes: Deep tendon reflexes are 2-/4 at the bilateral biceps, triceps, brachioradialis, 1/4 at the bilateral patella and absent at the bilateral achilles. Plantar responses are downgoing bilaterally.  Movement examination: Tone: There is normal tone in the bilateral upper extremities.  The tone in the lower extremities is normal.  Abnormal movements: He has no significant degree of rest tremor.  He does have some myoclonic jerks.  He has mild postural tremor. Coordination:  There is no decremation with RAM's, with any form of RAMS, including alternating supination and pronation of the forearm, hand opening and closing, finger taps, heel taps and toe taps.  Gait and Station: The patient requires 2 person assist (mild) to get out  of the chair.  He is given his walker.  He is forward flexed, but not shuffling.  He is slightly short stepped.   I have reviewed and interpreted the following labs independently   Chemistry  Component Value Date/Time   NA 134 (L) 04/08/2023 0722   NA 138 06/25/2014 0453   K 5.0 04/08/2023 0722   K 4.5 06/25/2014 0453   CL 107 04/08/2023 0722   CL 110 06/25/2014 0453   CO2 21 (L) 04/08/2023 0722   CO2 24 06/25/2014 0453   BUN 33 (H) 04/08/2023 0722   BUN 17 06/25/2014 0453   CREATININE 1.29 (H) 04/08/2023 0722   CREATININE 1.18 06/25/2014 0453      Component Value Date/Time   CALCIUM  7.9 (L) 04/08/2023 0722   CALCIUM  7.8 (L) 06/25/2014 0453   ALKPHOS 97 04/07/2023 0132   ALKPHOS 96 06/22/2014 1734   AST 22 04/07/2023 0132   AST 28 06/22/2014 1734   ALT 8 04/07/2023 0132   ALT 24 06/22/2014 1734   BILITOT 0.5 04/07/2023 0132   BILITOT 0.4 06/22/2014 1734      Lab Results  Component Value Date   TSH 9.129 (H) 04/07/2023   Lab Results  Component Value Date   WBC 4.6 04/08/2023   HGB 9.6 (L) 04/08/2023   HCT 29.2 (L) 04/08/2023   MCV 96.1 04/08/2023   PLT 119 (L) 04/08/2023     Total time spent on today's visit was 85 minutes, including both face-to-face time and nonface-to-face time.  Time included that spent on review of records (prior notes available to me/labs/imaging if pertinent), discussing treatment and goals, answering patient's questions and coordinating care.  Cc:  Nestor Banter, MD

## 2023-07-16 ENCOUNTER — Ambulatory Visit: Admitting: Neurology

## 2023-07-16 ENCOUNTER — Other Ambulatory Visit: Payer: Self-pay

## 2023-07-16 ENCOUNTER — Encounter: Payer: Self-pay | Admitting: Neurology

## 2023-07-16 VITALS — BP 135/76 | HR 71 | Ht 72.0 in | Wt 220.0 lb

## 2023-07-16 DIAGNOSIS — G500 Trigeminal neuralgia: Secondary | ICD-10-CM

## 2023-07-16 DIAGNOSIS — E1142 Type 2 diabetes mellitus with diabetic polyneuropathy: Secondary | ICD-10-CM

## 2023-07-16 DIAGNOSIS — G253 Myoclonus: Secondary | ICD-10-CM | POA: Diagnosis not present

## 2023-07-16 DIAGNOSIS — R251 Tremor, unspecified: Secondary | ICD-10-CM

## 2023-07-16 DIAGNOSIS — G20A1 Parkinson's disease without dyskinesia, without mention of fluctuations: Secondary | ICD-10-CM

## 2023-07-16 MED ORDER — CARBAMAZEPINE ER 200 MG PO TB12: 200.0000 mg | ORAL_TABLET | Freq: Three times a day (TID) | ORAL | Status: DC

## 2023-07-16 NOTE — Patient Instructions (Addendum)
 Take your carbidopa /levodopa  at 8am/noon/4pm   As a reminder, carbidopa /levodopa  can be taken at the same time as a carbohydrate, but we like to have you take your pill either 30 minutes before a protein source or 1 hour after as protein can interfere with carbidopa /levodopa  absorption.   Talk to your primary care and see if we can decrease the gabapentin .  I think you have myoclonus.  We discussed that you can decrease your carbamazepine  200 mg three times per day.  As we discussed, we are going to do a DaT scan.  We discussed that this is not a diagnostic scan, but will just give us  some information on dopamine levels in the brain.  Here is some information which may be helpful to you.  Before the Exam  Please tell the nurse, nuclear imaging technician or nuclear medicine physician if you are pregnant, nursing or have reduced liver function. Please also inform us  if you have an allergy or sensitivity to iodine.  The test may be completed with those who are allergic to iodine, but may require pre-medication with other medications to help avoid reactions. If you need to cancel the examination, please give us  at least 24 hours notice.  Before your scan, stop taking these medicines for the length of time shown: Name of Drug Stop Taking  Amoxapine 4 days before  Benztropine  Cogentin 3 days before  Bupropion (Aplenzin, Budeprion, Voxra, Wellbutrin, Zyban) 48 hours before  Buspirone 15 hours before  Citalopram 24 hours before  Cocaine 6 hours before  Escitalopram 24 hours before  Methamphetamine 24 hours before  Methylphenidate (Concerta, Metadate, Methylin, Ritalin) 20 hours before  Paroxetine 24 hours before  Selegilene 48 hours before  Sertraline 3 days before    On the Day of the Exam Drink plenty of fluids and go to the bathroom frequently (and for two days after your exam) Wear loose comfortable clothing, since you will need to lie still for a period of time. Please bring a list  of all medications that you are taking; name and dosage. We want to make your waiting time as pleasant as possible. Consider bringing your favorite magazine, book or music player to help you pass the time.  You do not need to stay at the imaging facility the entire time, between the initial injection and the scan itself.   Please leave your jewelry and valuables at home.  During the Exam The DaTscan once started takes approximately 30-45 minutes. However, following injection of the DaT agent approximately 3-6 hours are required before the agent has achieved appropriate concentration in the brain.  We will inject the DaTscan through an intravenous (IV) line into your arm in the AM, usually around 8-9am, and then you will come back usually in the mid afternoon for the scan. Before the exam, you will receive a drug to allow you to protect the thyroid . For the imaging test, you will be asked to lie on a table and an imaging technologist will position your head in a headrest. A strip of tape or a flexible restraint may be placed around your head to help you to not move your head during the scan. A camera will be positioned above you and you must remain very still for about 30 minute while images are taken. The scanner will be very close to your head, but will not touch your head.

## 2023-07-17 DIAGNOSIS — L97421 Non-pressure chronic ulcer of left heel and midfoot limited to breakdown of skin: Secondary | ICD-10-CM | POA: Diagnosis not present

## 2023-07-17 DIAGNOSIS — E1140 Type 2 diabetes mellitus with diabetic neuropathy, unspecified: Secondary | ICD-10-CM | POA: Diagnosis not present

## 2023-07-17 DIAGNOSIS — B351 Tinea unguium: Secondary | ICD-10-CM | POA: Diagnosis not present

## 2023-07-21 ENCOUNTER — Emergency Department (HOSPITAL_COMMUNITY)

## 2023-07-21 ENCOUNTER — Inpatient Hospital Stay (HOSPITAL_COMMUNITY)
Admission: EM | Admit: 2023-07-21 | Discharge: 2023-08-14 | DRG: 025 | Disposition: A | Discharge status: Skilled Nursing Facility | Attending: Internal Medicine | Admitting: Internal Medicine

## 2023-07-21 ENCOUNTER — Encounter (HOSPITAL_COMMUNITY)
Admission: EM | Disposition: A | Discharge status: Skilled Nursing Facility | Payer: Self-pay | Source: Home / Self Care | Attending: Neurosurgery

## 2023-07-21 DIAGNOSIS — G8104 Flaccid hemiplegia affecting left nondominant side: Secondary | ICD-10-CM | POA: Diagnosis present

## 2023-07-21 DIAGNOSIS — Z833 Family history of diabetes mellitus: Secondary | ICD-10-CM

## 2023-07-21 DIAGNOSIS — E869 Volume depletion, unspecified: Secondary | ICD-10-CM | POA: Diagnosis not present

## 2023-07-21 DIAGNOSIS — R1310 Dysphagia, unspecified: Secondary | ICD-10-CM | POA: Diagnosis present

## 2023-07-21 DIAGNOSIS — Z7984 Long term (current) use of oral hypoglycemic drugs: Secondary | ICD-10-CM

## 2023-07-21 DIAGNOSIS — E1142 Type 2 diabetes mellitus with diabetic polyneuropathy: Secondary | ICD-10-CM | POA: Diagnosis present

## 2023-07-21 DIAGNOSIS — J9811 Atelectasis: Secondary | ICD-10-CM | POA: Diagnosis not present

## 2023-07-21 DIAGNOSIS — E1122 Type 2 diabetes mellitus with diabetic chronic kidney disease: Secondary | ICD-10-CM | POA: Diagnosis not present

## 2023-07-21 DIAGNOSIS — I10 Essential (primary) hypertension: Secondary | ICD-10-CM | POA: Diagnosis not present

## 2023-07-21 DIAGNOSIS — E878 Other disorders of electrolyte and fluid balance, not elsewhere classified: Secondary | ICD-10-CM | POA: Diagnosis not present

## 2023-07-21 DIAGNOSIS — Z79899 Other long term (current) drug therapy: Secondary | ICD-10-CM

## 2023-07-21 DIAGNOSIS — N1831 Chronic kidney disease, stage 3a: Secondary | ICD-10-CM | POA: Diagnosis present

## 2023-07-21 DIAGNOSIS — G4700 Insomnia, unspecified: Secondary | ICD-10-CM | POA: Diagnosis present

## 2023-07-21 DIAGNOSIS — J189 Pneumonia, unspecified organism: Secondary | ICD-10-CM | POA: Diagnosis not present

## 2023-07-21 DIAGNOSIS — F0280 Dementia in other diseases classified elsewhere without behavioral disturbance: Secondary | ICD-10-CM | POA: Diagnosis not present

## 2023-07-21 DIAGNOSIS — F05 Delirium due to known physiological condition: Secondary | ICD-10-CM | POA: Diagnosis not present

## 2023-07-21 DIAGNOSIS — N179 Acute kidney failure, unspecified: Secondary | ICD-10-CM | POA: Diagnosis not present

## 2023-07-21 DIAGNOSIS — N400 Enlarged prostate without lower urinary tract symptoms: Secondary | ICD-10-CM | POA: Diagnosis not present

## 2023-07-21 DIAGNOSIS — I129 Hypertensive chronic kidney disease with stage 1 through stage 4 chronic kidney disease, or unspecified chronic kidney disease: Secondary | ICD-10-CM | POA: Diagnosis not present

## 2023-07-21 DIAGNOSIS — Z9911 Dependence on respirator [ventilator] status: Secondary | ICD-10-CM | POA: Diagnosis not present

## 2023-07-21 DIAGNOSIS — E440 Moderate protein-calorie malnutrition: Secondary | ICD-10-CM | POA: Diagnosis present

## 2023-07-21 DIAGNOSIS — G500 Trigeminal neuralgia: Secondary | ICD-10-CM | POA: Diagnosis present

## 2023-07-21 DIAGNOSIS — G4489 Other headache syndrome: Secondary | ICD-10-CM | POA: Diagnosis not present

## 2023-07-21 DIAGNOSIS — D649 Anemia, unspecified: Secondary | ICD-10-CM | POA: Diagnosis present

## 2023-07-21 DIAGNOSIS — R55 Syncope and collapse: Secondary | ICD-10-CM | POA: Diagnosis not present

## 2023-07-21 DIAGNOSIS — R918 Other nonspecific abnormal finding of lung field: Secondary | ICD-10-CM | POA: Diagnosis not present

## 2023-07-21 DIAGNOSIS — R509 Fever, unspecified: Secondary | ICD-10-CM | POA: Diagnosis not present

## 2023-07-21 DIAGNOSIS — W19XXXA Unspecified fall, initial encounter: Secondary | ICD-10-CM | POA: Diagnosis not present

## 2023-07-21 DIAGNOSIS — Z66 Do not resuscitate: Secondary | ICD-10-CM | POA: Diagnosis present

## 2023-07-21 DIAGNOSIS — R404 Transient alteration of awareness: Secondary | ICD-10-CM | POA: Diagnosis not present

## 2023-07-21 DIAGNOSIS — E875 Hyperkalemia: Secondary | ICD-10-CM | POA: Diagnosis present

## 2023-07-21 DIAGNOSIS — E785 Hyperlipidemia, unspecified: Secondary | ICD-10-CM | POA: Diagnosis not present

## 2023-07-21 DIAGNOSIS — Z4682 Encounter for fitting and adjustment of non-vascular catheter: Secondary | ICD-10-CM | POA: Diagnosis not present

## 2023-07-21 DIAGNOSIS — Z885 Allergy status to narcotic agent status: Secondary | ICD-10-CM

## 2023-07-21 DIAGNOSIS — E870 Hyperosmolality and hypernatremia: Secondary | ICD-10-CM | POA: Diagnosis not present

## 2023-07-21 DIAGNOSIS — I517 Cardiomegaly: Secondary | ICD-10-CM | POA: Diagnosis not present

## 2023-07-21 DIAGNOSIS — E11621 Type 2 diabetes mellitus with foot ulcer: Secondary | ICD-10-CM | POA: Diagnosis present

## 2023-07-21 DIAGNOSIS — A419 Sepsis, unspecified organism: Secondary | ICD-10-CM | POA: Diagnosis not present

## 2023-07-21 DIAGNOSIS — Z8249 Family history of ischemic heart disease and other diseases of the circulatory system: Secondary | ICD-10-CM

## 2023-07-21 DIAGNOSIS — I700 Atherosclerosis of aorta: Secondary | ICD-10-CM | POA: Diagnosis not present

## 2023-07-21 DIAGNOSIS — Z820 Family history of epilepsy and other diseases of the nervous system: Secondary | ICD-10-CM

## 2023-07-21 DIAGNOSIS — R109 Unspecified abdominal pain: Secondary | ICD-10-CM | POA: Diagnosis not present

## 2023-07-21 DIAGNOSIS — G9340 Encephalopathy, unspecified: Secondary | ICD-10-CM | POA: Diagnosis not present

## 2023-07-21 DIAGNOSIS — R531 Weakness: Secondary | ICD-10-CM | POA: Diagnosis not present

## 2023-07-21 DIAGNOSIS — Y92008 Other place in unspecified non-institutional (private) residence as the place of occurrence of the external cause: Secondary | ICD-10-CM

## 2023-07-21 DIAGNOSIS — G20A1 Parkinson's disease without dyskinesia, without mention of fluctuations: Secondary | ICD-10-CM | POA: Diagnosis present

## 2023-07-21 DIAGNOSIS — Z7982 Long term (current) use of aspirin: Secondary | ICD-10-CM

## 2023-07-21 DIAGNOSIS — E872 Acidosis, unspecified: Secondary | ICD-10-CM | POA: Diagnosis present

## 2023-07-21 DIAGNOSIS — R0989 Other specified symptoms and signs involving the circulatory and respiratory systems: Secondary | ICD-10-CM | POA: Diagnosis not present

## 2023-07-21 DIAGNOSIS — E8729 Other acidosis: Secondary | ICD-10-CM | POA: Diagnosis not present

## 2023-07-21 DIAGNOSIS — J690 Pneumonitis due to inhalation of food and vomit: Secondary | ICD-10-CM

## 2023-07-21 DIAGNOSIS — N183 Chronic kidney disease, stage 3 unspecified: Secondary | ICD-10-CM | POA: Diagnosis not present

## 2023-07-21 DIAGNOSIS — E119 Type 2 diabetes mellitus without complications: Secondary | ICD-10-CM | POA: Diagnosis not present

## 2023-07-21 DIAGNOSIS — Z9889 Other specified postprocedural states: Secondary | ICD-10-CM | POA: Diagnosis not present

## 2023-07-21 DIAGNOSIS — J9601 Acute respiratory failure with hypoxia: Secondary | ICD-10-CM | POA: Diagnosis present

## 2023-07-21 DIAGNOSIS — S065XAA Traumatic subdural hemorrhage with loss of consciousness status unknown, initial encounter: Secondary | ICD-10-CM | POA: Diagnosis not present

## 2023-07-21 DIAGNOSIS — R29720 NIHSS score 20: Secondary | ICD-10-CM | POA: Diagnosis present

## 2023-07-21 DIAGNOSIS — S065X0A Traumatic subdural hemorrhage without loss of consciousness, initial encounter: Secondary | ICD-10-CM | POA: Diagnosis not present

## 2023-07-21 DIAGNOSIS — L97429 Non-pressure chronic ulcer of left heel and midfoot with unspecified severity: Secondary | ICD-10-CM | POA: Diagnosis present

## 2023-07-21 DIAGNOSIS — I509 Heart failure, unspecified: Secondary | ICD-10-CM | POA: Diagnosis not present

## 2023-07-21 DIAGNOSIS — I6523 Occlusion and stenosis of bilateral carotid arteries: Secondary | ICD-10-CM | POA: Diagnosis not present

## 2023-07-21 DIAGNOSIS — J90 Pleural effusion, not elsewhere classified: Secondary | ICD-10-CM | POA: Diagnosis not present

## 2023-07-21 DIAGNOSIS — Z91018 Allergy to other foods: Secondary | ICD-10-CM

## 2023-07-21 DIAGNOSIS — E66811 Obesity, class 1: Secondary | ICD-10-CM | POA: Diagnosis present

## 2023-07-21 DIAGNOSIS — R5381 Other malaise: Secondary | ICD-10-CM | POA: Diagnosis present

## 2023-07-21 DIAGNOSIS — S06A0XA Traumatic brain compression without herniation, initial encounter: Secondary | ICD-10-CM | POA: Diagnosis present

## 2023-07-21 DIAGNOSIS — E11649 Type 2 diabetes mellitus with hypoglycemia without coma: Secondary | ICD-10-CM | POA: Diagnosis not present

## 2023-07-21 DIAGNOSIS — Z6831 Body mass index (BMI) 31.0-31.9, adult: Secondary | ICD-10-CM

## 2023-07-21 DIAGNOSIS — Z781 Physical restraint status: Secondary | ICD-10-CM

## 2023-07-21 DIAGNOSIS — E1165 Type 2 diabetes mellitus with hyperglycemia: Secondary | ICD-10-CM | POA: Diagnosis not present

## 2023-07-21 DIAGNOSIS — R414 Neurologic neglect syndrome: Secondary | ICD-10-CM | POA: Diagnosis present

## 2023-07-21 DIAGNOSIS — R062 Wheezing: Secondary | ICD-10-CM | POA: Diagnosis not present

## 2023-07-21 DIAGNOSIS — J9600 Acute respiratory failure, unspecified whether with hypoxia or hypercapnia: Secondary | ICD-10-CM | POA: Diagnosis not present

## 2023-07-21 DIAGNOSIS — W1830XA Fall on same level, unspecified, initial encounter: Secondary | ICD-10-CM | POA: Diagnosis present

## 2023-07-21 DIAGNOSIS — I6200 Nontraumatic subdural hemorrhage, unspecified: Secondary | ICD-10-CM | POA: Diagnosis not present

## 2023-07-21 HISTORY — PX: CRANIOTOMY: SHX93

## 2023-07-21 LAB — I-STAT CHEM 8, ED
BUN: 26 mg/dL — ABNORMAL HIGH (ref 8–23)
Calcium, Ion: 1.05 mmol/L — ABNORMAL LOW (ref 1.15–1.40)
Chloride: 107 mmol/L (ref 98–111)
Creatinine, Ser: 1.5 mg/dL — ABNORMAL HIGH (ref 0.61–1.24)
Glucose, Bld: 159 mg/dL — ABNORMAL HIGH (ref 70–99)
HCT: 32 % — ABNORMAL LOW (ref 39.0–52.0)
Hemoglobin: 10.9 g/dL — ABNORMAL LOW (ref 13.0–17.0)
Potassium: 5.2 mmol/L — ABNORMAL HIGH (ref 3.5–5.1)
Sodium: 139 mmol/L (ref 135–145)
TCO2: 24 mmol/L (ref 22–32)

## 2023-07-21 LAB — COMPREHENSIVE METABOLIC PANEL WITH GFR
ALT: 6 U/L (ref 0–44)
AST: 25 U/L (ref 15–41)
Albumin: 3 g/dL — ABNORMAL LOW (ref 3.5–5.0)
Alkaline Phosphatase: 117 U/L (ref 38–126)
Anion gap: 8 (ref 5–15)
BUN: 25 mg/dL — ABNORMAL HIGH (ref 8–23)
CO2: 23 mmol/L (ref 22–32)
Calcium: 8.2 mg/dL — ABNORMAL LOW (ref 8.9–10.3)
Chloride: 106 mmol/L (ref 98–111)
Creatinine, Ser: 1.47 mg/dL — ABNORMAL HIGH (ref 0.61–1.24)
GFR, Estimated: 47 mL/min — ABNORMAL LOW (ref >60)
Glucose, Bld: 161 mg/dL — ABNORMAL HIGH (ref 70–99)
Potassium: 5.2 mmol/L — ABNORMAL HIGH (ref 3.5–5.1)
Sodium: 137 mmol/L (ref 135–145)
Total Bilirubin: 0.7 mg/dL (ref 0.0–1.2)
Total Protein: 7 g/dL (ref 6.5–8.1)

## 2023-07-21 LAB — CBC
HCT: 32.9 % — ABNORMAL LOW (ref 39.0–52.0)
Hemoglobin: 10.6 g/dL — ABNORMAL LOW (ref 13.0–17.0)
MCH: 31.5 pg (ref 26.0–34.0)
MCHC: 32.2 g/dL (ref 30.0–36.0)
MCV: 97.6 fL (ref 80.0–100.0)
Platelets: 140 10*3/uL — ABNORMAL LOW (ref 150–400)
RBC: 3.37 MIL/uL — ABNORMAL LOW (ref 4.22–5.81)
RDW: 13.5 % (ref 11.5–15.5)
WBC: 7.8 10*3/uL (ref 4.0–10.5)
nRBC: 0 % (ref 0.0–0.2)

## 2023-07-21 LAB — DIFFERENTIAL
Abs Immature Granulocytes: 0.08 10*3/uL — ABNORMAL HIGH (ref 0.00–0.07)
Basophils Absolute: 0 10*3/uL (ref 0.0–0.1)
Basophils Relative: 0 %
Eosinophils Absolute: 0.2 10*3/uL (ref 0.0–0.5)
Eosinophils Relative: 3 %
Immature Granulocytes: 1 %
Lymphocytes Relative: 15 %
Lymphs Abs: 1.1 10*3/uL (ref 0.7–4.0)
Monocytes Absolute: 0.7 10*3/uL (ref 0.1–1.0)
Monocytes Relative: 9 %
Neutro Abs: 5.6 10*3/uL (ref 1.7–7.7)
Neutrophils Relative %: 72 %

## 2023-07-21 LAB — ETHANOL: Alcohol, Ethyl (B): <15 mg/dL (ref <15)

## 2023-07-21 LAB — PROTIME-INR
INR: 1.2 (ref 0.8–1.2)
Prothrombin Time: 15.1 s (ref 11.4–15.2)

## 2023-07-21 LAB — APTT: aPTT: 31 s (ref 24–36)

## 2023-07-21 LAB — CBG MONITORING, ED: Glucose-Capillary: 144 mg/dL — ABNORMAL HIGH (ref 70–99)

## 2023-07-21 SURGERY — CRANIOTOMY HEMATOMA EVACUATION SUBDURAL
Anesthesia: General | Laterality: Right

## 2023-07-21 MED ORDER — SUCCINYLCHOLINE CHLORIDE 200 MG/10ML IV SOSY: 120.0000 mg | PREFILLED_SYRINGE | Freq: Once | INTRAVENOUS | Status: DC

## 2023-07-21 MED ORDER — FENTANYL CITRATE PF 50 MCG/ML IJ SOSY
25.0000 ug | PREFILLED_SYRINGE | Freq: Once | INTRAMUSCULAR | Status: AC
  Administered 2023-07-22: 50 ug via INTRAVENOUS
  Filled 2023-07-21: qty 1

## 2023-07-21 MED ORDER — FENTANYL BOLUS VIA INFUSION: 25.0000 ug | INTRAVENOUS | Status: DC | PRN

## 2023-07-21 MED ORDER — FENTANYL 2500MCG IN NS 250ML (10MCG/ML) PREMIX INFUSION
0.0000 ug/h | INTRAVENOUS | Status: DC
  Administered 2023-07-22: 50 ug/h via INTRAVENOUS
  Filled 2023-07-21: qty 250

## 2023-07-21 MED ORDER — ROCURONIUM BROMIDE 10 MG/ML (PF) SYRINGE
100.0000 mg | PREFILLED_SYRINGE | Freq: Once | INTRAVENOUS | Status: AC
  Administered 2023-07-21: 100 mg via INTRAVENOUS

## 2023-07-21 MED ORDER — ETOMIDATE 2 MG/ML IV SOLN
20.0000 mg | Freq: Once | INTRAVENOUS | Status: AC
  Administered 2023-07-21: 20 mg via INTRAVENOUS

## 2023-07-21 MED ORDER — SODIUM CHLORIDE 0.9% FLUSH
3.0000 mL | Freq: Once | INTRAVENOUS | Status: AC
  Administered 2023-07-21: 3 mL via INTRAVENOUS

## 2023-07-21 MED ORDER — PROPOFOL 1000 MG/100ML IV EMUL
0.0000 ug/kg/min | INTRAVENOUS | Status: DC
  Administered 2023-07-21: 20 ug/kg/min via INTRAVENOUS

## 2023-07-21 MED ORDER — ONDANSETRON HCL 4 MG/2ML IJ SOLN
4.0000 mg | Freq: Once | INTRAMUSCULAR | Status: AC
  Administered 2023-07-21: 4 mg via INTRAVENOUS

## 2023-07-21 MED ORDER — CLEVIDIPINE BUTYRATE 0.5 MG/ML IV EMUL
0.0000 mg/h | INTRAVENOUS | Status: DC
Start: 2023-07-21 — End: 2023-07-22
  Administered 2023-07-21: 2 mg/h via INTRAVENOUS

## 2023-07-21 SURGICAL SUPPLY — 66 items
BAG COUNTER SPONGE SURGICOUNT (BAG) ×1 IMPLANT
BATTERY IQ STERILE (MISCELLANEOUS) IMPLANT
BENZOIN TINCTURE PRP APPL 2/3 (GAUZE/BANDAGES/DRESSINGS) IMPLANT
BLADE CLIPPER SURG (BLADE) ×1 IMPLANT
BNDG GAUZE DERMACEA FLUFF 4 (GAUZE/BANDAGES/DRESSINGS) IMPLANT
BNDG STRETCH 4X75 NS LF (GAUZE/BANDAGES/DRESSINGS) IMPLANT
BUR MATCHSTICK NEURO 3.0 LAGG (BURR) IMPLANT
BUR ROUND FLUTED 5 RND (BURR) IMPLANT
BUR SPIRAL ROUTER 2.3 (BUR) IMPLANT
CANISTER SUCTION 3000ML PPV (SUCTIONS) ×1 IMPLANT
CATH ROBINSON RED A/P 14FR (CATHETERS) IMPLANT
CLIP TI MEDIUM 6 (CLIP) IMPLANT
COVER BURR HOLE UNIV 10 (Orthopedic Implant) IMPLANT
DRAIN 7X20 FLAT PERF LF SIL ST (DRAIN) IMPLANT
DRAPE NEUROLOGICAL W/INCISE (DRAPES) ×1 IMPLANT
DRAPE SURG 17X23 STRL (DRAPES) IMPLANT
DRAPE WARM FLUID 44X44 (DRAPES) ×1 IMPLANT
DRSG TELFA 3X8 NADH STRL (GAUZE/BANDAGES/DRESSINGS) IMPLANT
DURAPREP 6ML APPLICATOR 50/CS (WOUND CARE) ×1 IMPLANT
ELECTRODE REM PT RTRN 9FT ADLT (ELECTROSURGICAL) ×1 IMPLANT
EVACUATOR 1/8 PVC DRAIN (DRAIN) IMPLANT
EVACUATOR SILICONE 100CC (DRAIN) IMPLANT
GAUZE 4X4 16PLY ~~LOC~~+RFID DBL (SPONGE) IMPLANT
GAUZE SPONGE 4X4 12PLY STRL (GAUZE/BANDAGES/DRESSINGS) ×1 IMPLANT
GAUZE SPONGE 4X4 12PLY STRL LF (GAUZE/BANDAGES/DRESSINGS) IMPLANT
GLOVE BIOGEL PI IND STRL 7.5 (GLOVE) ×1 IMPLANT
GLOVE ECLIPSE 7.0 STRL STRAW (GLOVE) ×2 IMPLANT
GLOVE EXAM NITRILE XL STR (GLOVE) IMPLANT
GOWN STRL REUS W/ TWL LRG LVL3 (GOWN DISPOSABLE) ×2 IMPLANT
GOWN STRL REUS W/ TWL XL LVL3 (GOWN DISPOSABLE) IMPLANT
GOWN STRL REUS W/TWL 2XL LVL3 (GOWN DISPOSABLE) IMPLANT
HEMOSTAT POWDER KIT SURGIFOAM (HEMOSTASIS) ×1 IMPLANT
HEMOSTAT SURGICEL 2X14 (HEMOSTASIS) IMPLANT
HOOK DURA 1/2IN (MISCELLANEOUS) ×1 IMPLANT
IV NS IRRIG 3000ML ARTHROMATIC (IV SOLUTION) IMPLANT
KIT BASIN OR (CUSTOM PROCEDURE TRAY) ×1 IMPLANT
KIT TURNOVER KIT B (KITS) ×1 IMPLANT
NDL HYPO 22X1.5 SAFETY MO (MISCELLANEOUS) ×1 IMPLANT
NEEDLE HYPO 22X1.5 SAFETY MO (MISCELLANEOUS) ×1 IMPLANT
NS IRRIG 1000ML POUR BTL (IV SOLUTION) ×1 IMPLANT
PACK BATTERY CMF DISP FOR DVR (ORTHOPEDIC DISPOSABLE SUPPLIES) IMPLANT
PACK CRANIOTOMY CUSTOM (CUSTOM PROCEDURE TRAY) ×1 IMPLANT
PATTIES SURGICAL .5 X.5 (GAUZE/BANDAGES/DRESSINGS) ×1 IMPLANT
PATTIES SURGICAL .5 X3 (DISPOSABLE) IMPLANT
PATTIES SURGICAL 1X1 (DISPOSABLE) ×1 IMPLANT
PLATE UNIV CMF 16 2H (Plate) IMPLANT
SCREW UNIII AXS SD 1.5X4 (Screw) IMPLANT
SET CYSTO W/LG BORE CLAMP LF (SET/KITS/TRAYS/PACK) IMPLANT
SPONGE NEURO XRAY DETECT 1X3 (DISPOSABLE) IMPLANT
SPONGE SURGIFOAM ABS GEL 100 (HEMOSTASIS) ×1 IMPLANT
STAPLER SKIN PROX 35W (STAPLE) ×1 IMPLANT
STOCKINETTE 6 STRL (DRAPES) ×1 IMPLANT
SUT 3-0 BLK 1X30 PSL (SUTURE) IMPLANT
SUT ETHILON 3 0 PS 1 (SUTURE) IMPLANT
SUT NURALON 4 0 TR CR/8 (SUTURE) ×3 IMPLANT
SUT VIC AB 0 CT1 18XCR BRD8 (SUTURE) ×2 IMPLANT
SUT VIC AB 3-0 SH 8-18 (SUTURE) ×2 IMPLANT
SYR 30ML SLIP (SYRINGE) ×1 IMPLANT
TAPE CLOTH 1X10 TAN NS (GAUZE/BANDAGES/DRESSINGS) ×1 IMPLANT
TOWEL GREEN STERILE (TOWEL DISPOSABLE) ×1 IMPLANT
TOWEL GREEN STERILE FF (TOWEL DISPOSABLE) ×1 IMPLANT
TRAY FOLEY MTR SLVR 16FR STAT (SET/KITS/TRAYS/PACK) ×1 IMPLANT
TUBE CONNECTING 12X1/4 (SUCTIONS) ×1 IMPLANT
TUBING FEATHERFLOW (TUBING) IMPLANT
UNDERPAD 30X36 HEAVY ABSORB (UNDERPADS AND DIAPERS) ×1 IMPLANT
WATER STERILE IRR 1000ML POUR (IV SOLUTION) ×1 IMPLANT

## 2023-07-21 NOTE — ED Provider Notes (Signed)
 [facility]GENCY DEPT [facility] Emergency Department Provider Note MRN:  [id number]  Arrival date & time: 07/22/23     Chief Complaint   Code Stroke (LSW 2130)   History of Present Illness   Jack White is a 83 y.o. year-old male with a history of diabetes, CKD presenting to the ED with chief complaint of code stroke.  Last known well 2130 reportedly had a fall earlier today followed by headache.  Then had some vomiting.  Then became less responsive and now has left-sided flaccid paralysis.  Review of Systems  I was unable to obtain a full/accurate HPI, PMH, or ROS due to the patient's altered mental status.  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 Luise Saint   COLOSTOMY TAKEDOWN N/A 10/26/2014   Procedure: COLOSTOMY TAKEDOWN;  Surgeon: Rhina Center III, MD;  Location: [facility] ORS;  Service: General;  Laterality: N/A;   EXPLORATORY LAPAROTOMY  05/28/2014   Dr. Luise Saint   LAPAROSCOPIC CHOLECYSTECTOMY  05/21/2014   Dr. 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  Tobacco Use   Smoking status: Never   Smokeless tobacco: Never  Substance and Sexual Activity   Alcohol use: No    Alcohol/week: 0.0 standard drinks of alcohol   Drug use: No   Sexual activity: Not on file  Other Topics Concern   Not on file  Social History Narrative   Not on file   Social Drivers of Health   Financial Resource Strain: Patient Declined (02/07/2023)   Received from [facility]   Overall Financial Resource Strain (CARDIA)    Difficulty of Paying Living Expenses: Patient declined  Food Insecurity: No Food Insecurity (04/07/2023)   Hunger Vital Sign    Worried About Running Out of Food in the Last Year: Never true    Ran Out of Food in the Last Year: Never true  Transportation Needs: No Transportation Needs (04/07/2023)   PRAPARE - Administrator, Civil Service (Medical): No    Lack of Transportation (Non-Medical): No  Physical Activity: Not on file  Stress: Not on file  Social Connections: Moderately Integrated (04/07/2023)   Social Connection and Isolation Panel [NHANES]    Frequency of Communication with Friends and Family: Three times a week    Frequency of  Social Gatherings with Friends and Family: Three times a week    Attends Religious Services: More than 4 times per year    Active Member of Clubs or Organizations: Yes    Attends Banker Meetings: More than 4 times per year    Marital Status: Widowed  Intimate Partner Violence: Not At Risk (04/07/2023)   Humiliation, Afraid, Rape, and Kick questionnaire    Fear of Current or Ex-Partner: No    Emotionally Abused: No    Physically Abused: No    Sexually Abused: No     Physical Exam   Vitals:   07/22/23 0010 07/22/23 0015  BP: (!) 119/54 125/63  Pulse: 76 64  Resp: 14 18  Temp:    SpO2: 100% 100%     CONSTITUTIONAL: Ill-appearing NEURO/PSYCH: Somnolent, occasionally responds to questions, flaccid paralysis on the left, left-sided neglect EYES:  eyes equal and reactive ENT/NECK:  no LAD, no JVD CARDIO: Regular rate, well-perfused, normal S1 and S2 PULM:  CTAB no wheezing or rhonchi GI/GU:  non-distended, non-tender MSK/SPINE:  No gross deformities, no edema SKIN:  no rash, atraumatic   *Additional and/or pertinent findings included in MDM below  Diagnostic and Interventional Summary    EKG Interpretation Date/Time:  Monday Jul 21 2023 23:21:22 EDT Ventricular Rate:  78 PR Interval:  257 QRS Duration:  123 QT Interval:  400 QTC Calculation: 456 R Axis:   -36  Text Interpretation: Sinus rhythm Prolonged PR interval Left bundle branch block Confirmed by Gwenetta Lennert [phone]) on 07/22/2023 12:24:59 AM       Labs Reviewed  CBC - Abnormal; Notable for the following components:      Result Value   RBC 3.37 (*)    Hemoglobin 10.6 (*)    HCT 32.9 (*)    Platelets 140 (*)    All other components within normal limits  DIFFERENTIAL - Abnormal; Notable for the following components:   Abs Immature Granulocytes 0.08 (*)    All other components within normal limits  COMPREHENSIVE METABOLIC PANEL WITH GFR - Abnormal; Notable for the following components:   Potassium 5.2 (*)    Glucose, Bld 161 (*)    BUN 25 (*)    Creatinine, Ser 1.47 (*)    Calcium  8.2 (*)    Albumin 3.0 (*)    GFR, Estimated 47 (*)    All other components within normal limits  I-STAT CHEM 8, ED - Abnormal; Notable for the following components:   Potassium 5.2 (*)    BUN 26 (*)    Creatinine, Ser 1.50 (*)    Glucose, Bld 159 (*)    Calcium , Ion 1.05 (*)    Hemoglobin 10.9 (*)    HCT 32.0 (*)    All other components within normal limits  CBG MONITORING, ED - Abnormal; Notable for the following components:   Glucose-Capillary 144 (*)    All other components within normal limits  ETHANOL  PROTIME-INR   APTT  TRIGLYCERIDES    DG Chest Portable 1 View  Final Result    DG Abd 1 View  Final Result    CT HEAD CODE STROKE WO CONTRAST  Final Result      Medications  clevidipine (CLEVIPREX) infusion 0.5 mg/mL (2 mg/hr Intravenous Continued from Pre-op 07/22/23 0008)  fentaNYL  in NS (2mcg/ml) infusion-PREMIX (50 mcg/hr Intravenous Continued from Pre-op 07/22/23 0008)  fentaNYL  (SUBLIMAZE ) bolus via infusion 25-100 mcg (has no administration in time range)  propofol  (DIPRIVAN ) 1000 MG/100ML  infusion (20 mcg/kg/min  105 kg Intravenous Continued from Pre-op 07/22/23 0008)  sodium chloride  flush (NS) 0.9 % injection 3 mL (3 mLs Intravenous Given 07/21/23 2325)  ondansetron  (ZOFRAN ) injection 4 mg (4 mg Intravenous Given 07/21/23 2327)  etomidate (AMIDATE) injection 20 mg (20 mg Intravenous Given 07/21/23 2347)  fentaNYL  (SUBLIMAZE ) injection 25-50 mcg (50 mcg Intravenous Given 07/22/23 0009)  rocuronium  (ZEMURON ) injection 100 mg (100 mg Intravenous Given 07/21/23 2347)     Procedures  /  Critical Care .Critical Care  Performed by: Edson Graces, MD Authorized by: Edson Graces, MD   Critical care provider statement:    Critical care time (minutes):  80   Critical care was necessary to treat or prevent imminent or life-threatening deterioration of the following conditions: Traumatic subdural hematoma with concern for herniation.   Critical care was time spent personally by me on the following activities:  Development of treatment plan with patient or surrogate, discussions with consultants, evaluation of patient's response to treatment, examination of patient, ordering and review of laboratory studies, ordering and review of radiographic studies, ordering and performing treatments and interventions, pulse oximetry, re-evaluation of patient's condition and review of old charts Procedure Name: Intubation Date/Time: 07/22/2023 12:21 AM  Performed by: Edson Graces,  MDPre-anesthesia Checklist: Patient identified, Patient being monitored, Emergency Drugs available, Timeout performed and Suction available Oxygen Delivery Method: Non-rebreather mask Preoxygenation: Pre-oxygenation with 100% oxygen Induction Type: Rapid sequence Ventilation: Mask ventilation without difficulty Laryngoscope Size: Glidescope and 4 Grade View: Grade I Tube size: 7.5 mm Number of attempts: 1 Airway Equipment and Method: Rigid stylet Placement Confirmation: ETT inserted through vocal cords under direct vision, CO2 detector and Breath sounds checked- equal and bilateral Secured at: 25 cm Tube secured with: ETT holder Comments: RSI using 20 mg etomidate, 100 mg rocuronium  (mild hyperkalemia)      ED Course and Medical Decision Making  Initial Impression and Ddx Code stroke initiated prior to arrival, flaccid paralysis on the left.  On arrival patient had an episode of emesis.  Provided with Zofran .  No concerns for aspiration with EMS, will have to monitor closely.  Currently seems to be protecting airway.  Rapid imaging obtained, large right-sided subdural hematoma with large amount of shift, evidence of subfalcine herniation and impending uncal herniation.  Starting Cleviprex.  Patient does not take blood thinners other than daily baby aspirin .  Spoke with neurosurgery team, requesting emergent decompression.  Past medical/surgical history that increases complexity of ED encounter: Diabetes  Interpretation of Diagnostics I personally reviewed the EKG and my interpretation is as follows: Sinus rhythm, left bundle, unchanged  Labs reveal mild hyperkalemia  Patient Reassessment and Ultimate Disposition/Management     Case discussed with Dr. Nat Badger of neurosurgery, plan is for operative intervention.  Per neurosurgery recommendations, intubated to help facilitate OR management as above.  Patient management required discussion with the following services or consulting  groups:  Neurosurgery  Complexity of Problems Addressed Acute illness or injury that poses threat of life of bodily function  Additional Data Reviewed and Analyzed Further history obtained from: EMS on arrival and Further history from spouse/family member  Additional Factors Impacting ED Encounter Risk Consideration of hospitalization and Major procedures  Merrick Abe. Harless Lien, MD [facility] Emergency Medicine [facility] [email or web address]  Final Clinical Impressions(s) / ED Diagnoses     ICD-10-CM   1. Subdural hematoma (HCC)  S06.[facility]       ED Discharge Orders     None  Discharge Instructions Discussed with and Provided to Patient:   Discharge Instructions   None      Edson Graces, MD 07/22/23 [phone]

## 2023-07-21 NOTE — ED Notes (Signed)
 Need to collect blue top.  KM

## 2023-07-21 NOTE — ED Triage Notes (Signed)
 Patient arrived with EMS from home as a Code Stroke , LSW 2130 this evening , found on the floor this evening by family , reports headache with left side weakness / emesis . CBG=150.

## 2023-07-21 NOTE — Code Documentation (Signed)
 Responded.

## 2023-07-21 NOTE — Consult Note (Signed)
 NEUROLOGY CONSULT NOTE   Date of service: Jul 21, 2023 Patient Name: Jack White MRN:  [id number] DOB:  1940-03-14 Chief Complaint: "Found down with decreased responsiveness" Requesting Provider: Edson Graces, MD  History of Present Illness  QUENTION MCNEILL is a 83 y.o. male with hx of ***  Was seen by [facility] Neurology on 07/16/23 (Dr. Winferd Hatter). Her assessment and plan from that visit has been reviewed: 1.  Parkinson's disease, by history             - The patient actually did not look that parkinsonian today, but it may be because he is on levodopa .  I looked at the [facility] records prior to starting levodopa , and they noted that he really did not have tremor, but noted that he was moderately rigid in arms and legs, which I did not see today, so perhaps that is because he is on levodopa .  He is really spreading the levodopa  too far apart, so I gave him instructions on how to take the levodopa .  He was told to take carbidopa /levodopa  25/100, 1 tablet at 8 AM/noon/4 PM.  He was told about potential interactions with protein, which he states that he did not know previously.             - We decided to go ahead and proceed with DaTscan.             -he is a [place] vet but has not gone through [place] for this.  We discussed VA services and VA connected disability, but patient states that his [place] records are not correct and states that he has never been to [place], so this may be a challenge for him.             - I agree with the physical therapy.   2.  Trigeminal neuralgia             - Patient is on fairly high-dose carbamazepine .  He is following with [facility] neurology.  Limiting this medication to as well as dose as possible would be beneficial, especially in this age group.  In addition, trigeminal neuralgia tends to wax and wane, and I often will take patients off of the medication when they are doing better, so as not to dose escalate.  He is no longer symptomatic and I think we could go ahead and  start backing off on the dose.  Will decrease the carbamazepine  to 200 mg 3 times per day and hopefully be able to wean it further soon.             -DaTscan             -protein interaction with levodopa    3.  Diabetic peripheral neuropathy             - Contributes to gait instability.             - I agree with [facility] movement disorders that we would like to avoid use of nortriptyline .             - Patient currently on gabapentin , 300 mg at bed.  I wonder if he is contributing to possible myoclonus   4.  Myoclonus             - While he had some degree of tremor today, he really had more myoclonus than tremor             -gabapentin  in combination with renal insufficiency can  contribute to myoclonus.  His gabapentin  was just increased on Friday, and he thought that the movements got worse since Friday.  His primary care is prescribing this.  I would probably recommend that we try to taper him off of the gabapentin .  He was already on Cymbalta  for the treatment of neuropathy.  LKW: *** Modified rankin score: {Modified Rankin Scale:21264} IV Thrombolysis: ***Yes, *** No (reason) EVT: ***Yes, *** No (reason) ICH Score:***  NIHSS components Score: Comment  1a Level of Conscious 0[]  1[]  2[]  3[]      1b LOC Questions 0[]  1[]  2[]       1c LOC Commands 0[]  1[]  2[]       2 Best Gaze 0[]  1[]  2[]       3 Visual 0[]  1[]  2[]  3[]      4 Facial Palsy 0[]  1[]  2[]  3[]      5a Motor Arm - left 0[]  1[]  2[]  3[]  4[]  UN[]    5b Motor Arm - Right 0[]  1[]  2[]  3[]  4[]  UN[]    6a Motor Leg - Left 0[]  1[]  2[]  3[]  4[]  UN[]    6b Motor Leg - Right 0[]  1[]  2[]  3[]  4[]  UN[]    7 Limb Ataxia 0[]  1[]  2[]  UN[]      8 Sensory 0[]  1[]  2[]  UN[]      9 Best Language 0[]  1[]  2[]  3[]      10 Dysarthria 0[]  1[]  2[]  UN[]      11 Extinct. 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 Luise Saint   COLOSTOMY TAKEDOWN N/A 10/26/2014   Procedure: COLOSTOMY TAKEDOWN;  Surgeon: Rhina Center III, MD;  Location: [facility] ORS;  Service: General;  Laterality: N/A;   EXPLORATORY LAPAROTOMY  05/28/2014   Dr. Luise Saint   LAPAROSCOPIC CHOLECYSTECTOMY  05/21/2014   Dr. Amalia Badder    Family History: Family History  Problem Relation Age of Onset   Diabetes Mother    Hypertension Mother    Alzheimer's disease Mother    Heart disease Father    Diabetes Sister     Social History  reports that he has never smoked. He has never used smokeless tobacco. He reports that he does not drink alcohol and does not use drugs.  Allergies  Allergen Reactions   Citrus    Dilaudid [Hydromorphone Hcl] Other (See Comments)    Respiratory arrest   Tomato Other (See Comments)    GI distress, respiratory distress    Medications   Current Facility-Administered Medications:    sodium chloride  flush (NS) 0.9 % injection 3 mL, 3 mL, Intravenous, Once, Edson Graces, MD  Current Outpatient Medications:    aspirin  81 MG tablet, Take 81 mg by mouth daily., Disp: , Rfl:    atorvastatin  (LIPITOR) 10 MG tablet, Take 5 mg by mouth at bedtime., Disp: , Rfl:    carbamazepine  (TEGRETOL  XR) 200  MG 12 hr tablet, Take 1 tablet (200 mg total) by mouth 3 (three) times daily., Disp: , Rfl:    carbidopa -levodopa  (SINEMET  IR) 25-100 MG tablet, Take 1 tablet by mouth 3 (three) times daily., Disp: , Rfl:    doxazosin  (CARDURA ) 4 MG tablet, Take 1 tablet by  mouth daily., Disp: , Rfl:    DULoxetine  (CYMBALTA ) 60 MG capsule, Take 60 mg by mouth daily., Disp: , Rfl:    ergocalciferol (VITAMIN D2) 1.25 MG (50000 UT) capsule, Take 50,000 Units by mouth once a week., Disp: , Rfl:    fluticasone  (FLONASE ) 50 MCG/ACT nasal spray, Place 2 sprays into both nostrils daily., Disp: , Rfl:    gabapentin  (NEURONTIN ) 300 MG capsule, Take 300 mg by mouth 2 (two) times daily., Disp: , Rfl:    hydrocortisone  cream 1 %, Apply topically 3 (three) times daily., Disp: 30 g, Rfl: 0   metFORMIN (GLUCOPHAGE) 1000 MG tablet, Take 1,000 mg by mouth 2 (two) times daily with a meal., Disp: , Rfl:    metoprolol  succinate (TOPROL -XL) 25 MG 24 hr tablet, Take 1 tablet by mouth daily., Disp: , Rfl:    montelukast  (SINGULAIR ) 10 MG tablet, Take 10 mg by mouth at bedtime., Disp: , Rfl:    omeprazole (PRILOSEC) 20 MG capsule, Take 1 capsule by mouth daily., Disp: , Rfl:    ondansetron  (ZOFRAN ) 4 MG tablet, Take 1 tablet (4 mg total) by mouth every 6 (six) hours as needed for nausea., Disp: 20 tablet, Rfl: 0   tamsulosin  (FLOMAX ) 0.4 MG CAPS capsule, Take 0.4 mg by mouth daily after breakfast., Disp: , Rfl:   Vitals  There were no vitals filed for this visit.  There is no height or weight on file to calculate BMI.  Physical Exam   Constitutional: Appears well-developed and well-nourished. *** Psych: Affect appropriate to situation. *** Eyes: No scleral injection. *** HENT: No OP obstruction. *** Head: Normocephalic. *** Cardiovascular: Normal rate and regular rhythm. *** Respiratory: Effort normal, non-labored breathing. *** GI: Soft.  No distension. There is no tenderness. *** Skin: WDI. ***  Neurologic Examination   ***  Labs/Imaging/Neurodiagnostic studies   CBC: No results for input(s): "WBC", "NEUTROABS", "HGB", "HCT", "MCV", "PLT" in the last 168 hours. Basic Metabolic Panel:  Lab Results  Component Value Date   NA 134 (L) 04/08/2023   K 5.0 04/08/2023   CO2  21 (L) 04/08/2023   GLUCOSE 90 04/08/2023   BUN 33 (H) 04/08/2023   CREATININE 1.29 (H) 04/08/2023   CALCIUM  7.9 (L) 04/08/2023   GFRNONAA 55 (L) 04/08/2023   GFRAA 50 (L) 11/01/2014   Lipid Panel:  Lab Results  Component Value Date   LDLCALC 66 05/18/2014   HgbA1c: No results found for: "HGBA1C" Urine Drug Screen: No results found for: "LABOPIA", "COCAINSCRNUR", "LABBENZ", "AMPHETMU", "THCU", "LABBARB"  Alcohol Level No results found for: "ETH" INR  Lab Results  Component Value Date   INR 1.3 06/09/2014   APTT  Lab Results  Component Value Date   APTT 39.5 (H) 06/09/2014     ASSESSMENT   VERNEL LANGENDERFER is a 83 y.o. male ***  RECOMMENDATIONS  *** ______________________________________________________________________    Hope Ly, Sharan Mcenaney, MD [facility]

## 2023-07-21 NOTE — ED Notes (Signed)
 Cleviprex infusion started by rapid response nurse at bedside .

## 2023-07-22 ENCOUNTER — Emergency Department (HOSPITAL_COMMUNITY): Payer: Self-pay | Admitting: Certified Registered Nurse Anesthetist

## 2023-07-22 ENCOUNTER — Other Ambulatory Visit: Payer: Self-pay

## 2023-07-22 ENCOUNTER — Emergency Department (HOSPITAL_COMMUNITY)

## 2023-07-22 ENCOUNTER — Encounter (HOSPITAL_COMMUNITY): Payer: Self-pay | Admitting: Certified Registered Nurse Anesthetist

## 2023-07-22 ENCOUNTER — Inpatient Hospital Stay (HOSPITAL_COMMUNITY)

## 2023-07-22 DIAGNOSIS — I6201 Nontraumatic acute subdural hemorrhage: Secondary | ICD-10-CM | POA: Diagnosis not present

## 2023-07-22 DIAGNOSIS — Z452 Encounter for adjustment and management of vascular access device: Secondary | ICD-10-CM | POA: Diagnosis not present

## 2023-07-22 DIAGNOSIS — I6200 Nontraumatic subdural hemorrhage, unspecified: Secondary | ICD-10-CM | POA: Diagnosis not present

## 2023-07-22 DIAGNOSIS — J189 Pneumonia, unspecified organism: Secondary | ICD-10-CM | POA: Diagnosis not present

## 2023-07-22 DIAGNOSIS — N183 Chronic kidney disease, stage 3 unspecified: Secondary | ICD-10-CM | POA: Diagnosis not present

## 2023-07-22 DIAGNOSIS — I10 Essential (primary) hypertension: Secondary | ICD-10-CM | POA: Diagnosis not present

## 2023-07-22 DIAGNOSIS — D631 Anemia in chronic kidney disease: Secondary | ICD-10-CM | POA: Diagnosis not present

## 2023-07-22 DIAGNOSIS — N179 Acute kidney failure, unspecified: Secondary | ICD-10-CM | POA: Diagnosis not present

## 2023-07-22 DIAGNOSIS — Z66 Do not resuscitate: Secondary | ICD-10-CM | POA: Diagnosis not present

## 2023-07-22 DIAGNOSIS — Z9889 Other specified postprocedural states: Secondary | ICD-10-CM

## 2023-07-22 DIAGNOSIS — G500 Trigeminal neuralgia: Secondary | ICD-10-CM | POA: Diagnosis not present

## 2023-07-22 DIAGNOSIS — D649 Anemia, unspecified: Secondary | ICD-10-CM | POA: Diagnosis not present

## 2023-07-22 DIAGNOSIS — S065XAA Traumatic subdural hemorrhage with loss of consciousness status unknown, initial encounter: Principal | ICD-10-CM

## 2023-07-22 DIAGNOSIS — J9601 Acute respiratory failure with hypoxia: Secondary | ICD-10-CM | POA: Diagnosis not present

## 2023-07-22 DIAGNOSIS — S065X0A Traumatic subdural hemorrhage without loss of consciousness, initial encounter: Secondary | ICD-10-CM | POA: Diagnosis not present

## 2023-07-22 DIAGNOSIS — R29720 NIHSS score 20: Secondary | ICD-10-CM | POA: Diagnosis not present

## 2023-07-22 DIAGNOSIS — J9811 Atelectasis: Secondary | ICD-10-CM | POA: Diagnosis not present

## 2023-07-22 DIAGNOSIS — I959 Hypotension, unspecified: Secondary | ICD-10-CM | POA: Diagnosis not present

## 2023-07-22 DIAGNOSIS — G8104 Flaccid hemiplegia affecting left nondominant side: Secondary | ICD-10-CM | POA: Diagnosis not present

## 2023-07-22 DIAGNOSIS — A419 Sepsis, unspecified organism: Secondary | ICD-10-CM | POA: Diagnosis not present

## 2023-07-22 DIAGNOSIS — F05 Delirium due to known physiological condition: Secondary | ICD-10-CM | POA: Diagnosis not present

## 2023-07-22 DIAGNOSIS — R531 Weakness: Secondary | ICD-10-CM | POA: Diagnosis not present

## 2023-07-22 DIAGNOSIS — E8729 Other acidosis: Secondary | ICD-10-CM

## 2023-07-22 DIAGNOSIS — S31010D Laceration without foreign body of lower back and pelvis without penetration into retroperitoneum, subsequent encounter: Secondary | ICD-10-CM | POA: Diagnosis not present

## 2023-07-22 DIAGNOSIS — Z96641 Presence of right artificial hip joint: Secondary | ICD-10-CM | POA: Diagnosis not present

## 2023-07-22 DIAGNOSIS — R0989 Other specified symptoms and signs involving the circulatory and respiratory systems: Secondary | ICD-10-CM | POA: Diagnosis not present

## 2023-07-22 DIAGNOSIS — E870 Hyperosmolality and hypernatremia: Secondary | ICD-10-CM | POA: Diagnosis not present

## 2023-07-22 DIAGNOSIS — N1831 Chronic kidney disease, stage 3a: Secondary | ICD-10-CM | POA: Diagnosis not present

## 2023-07-22 DIAGNOSIS — Z7401 Bed confinement status: Secondary | ICD-10-CM | POA: Diagnosis not present

## 2023-07-22 DIAGNOSIS — E878 Other disorders of electrolyte and fluid balance, not elsewhere classified: Secondary | ICD-10-CM | POA: Diagnosis not present

## 2023-07-22 DIAGNOSIS — E869 Volume depletion, unspecified: Secondary | ICD-10-CM | POA: Diagnosis not present

## 2023-07-22 DIAGNOSIS — E1142 Type 2 diabetes mellitus with diabetic polyneuropathy: Secondary | ICD-10-CM | POA: Diagnosis not present

## 2023-07-22 DIAGNOSIS — Z4682 Encounter for fitting and adjustment of non-vascular catheter: Secondary | ICD-10-CM | POA: Diagnosis not present

## 2023-07-22 DIAGNOSIS — Y92008 Other place in unspecified non-institutional (private) residence as the place of occurrence of the external cause: Secondary | ICD-10-CM | POA: Diagnosis not present

## 2023-07-22 DIAGNOSIS — I129 Hypertensive chronic kidney disease with stage 1 through stage 4 chronic kidney disease, or unspecified chronic kidney disease: Secondary | ICD-10-CM | POA: Diagnosis not present

## 2023-07-22 DIAGNOSIS — M1990 Unspecified osteoarthritis, unspecified site: Secondary | ICD-10-CM | POA: Diagnosis not present

## 2023-07-22 DIAGNOSIS — J90 Pleural effusion, not elsewhere classified: Secondary | ICD-10-CM | POA: Diagnosis not present

## 2023-07-22 DIAGNOSIS — E119 Type 2 diabetes mellitus without complications: Secondary | ICD-10-CM

## 2023-07-22 DIAGNOSIS — Z7951 Long term (current) use of inhaled steroids: Secondary | ICD-10-CM | POA: Diagnosis not present

## 2023-07-22 DIAGNOSIS — R4182 Altered mental status, unspecified: Secondary | ICD-10-CM | POA: Diagnosis not present

## 2023-07-22 DIAGNOSIS — G4700 Insomnia, unspecified: Secondary | ICD-10-CM | POA: Diagnosis not present

## 2023-07-22 DIAGNOSIS — I6523 Occlusion and stenosis of bilateral carotid arteries: Secondary | ICD-10-CM | POA: Diagnosis not present

## 2023-07-22 DIAGNOSIS — J9600 Acute respiratory failure, unspecified whether with hypoxia or hypercapnia: Secondary | ICD-10-CM | POA: Diagnosis not present

## 2023-07-22 DIAGNOSIS — Z85828 Personal history of other malignant neoplasm of skin: Secondary | ICD-10-CM | POA: Diagnosis not present

## 2023-07-22 DIAGNOSIS — R414 Neurologic neglect syndrome: Secondary | ICD-10-CM | POA: Diagnosis not present

## 2023-07-22 DIAGNOSIS — S06A0XA Traumatic brain compression without herniation, initial encounter: Secondary | ICD-10-CM | POA: Diagnosis not present

## 2023-07-22 DIAGNOSIS — R32 Unspecified urinary incontinence: Secondary | ICD-10-CM | POA: Diagnosis not present

## 2023-07-22 DIAGNOSIS — G9340 Encephalopathy, unspecified: Secondary | ICD-10-CM | POA: Diagnosis not present

## 2023-07-22 DIAGNOSIS — Z96651 Presence of right artificial knee joint: Secondary | ICD-10-CM | POA: Diagnosis not present

## 2023-07-22 DIAGNOSIS — E785 Hyperlipidemia, unspecified: Secondary | ICD-10-CM | POA: Diagnosis not present

## 2023-07-22 DIAGNOSIS — W1830XA Fall on same level, unspecified, initial encounter: Secondary | ICD-10-CM | POA: Diagnosis present

## 2023-07-22 DIAGNOSIS — I252 Old myocardial infarction: Secondary | ICD-10-CM | POA: Diagnosis not present

## 2023-07-22 DIAGNOSIS — Z48815 Encounter for surgical aftercare following surgery on the digestive system: Secondary | ICD-10-CM | POA: Diagnosis not present

## 2023-07-22 DIAGNOSIS — L97429 Non-pressure chronic ulcer of left heel and midfoot with unspecified severity: Secondary | ICD-10-CM | POA: Diagnosis not present

## 2023-07-22 DIAGNOSIS — J690 Pneumonitis due to inhalation of food and vomit: Secondary | ICD-10-CM | POA: Diagnosis not present

## 2023-07-22 DIAGNOSIS — E440 Moderate protein-calorie malnutrition: Secondary | ICD-10-CM | POA: Diagnosis not present

## 2023-07-22 DIAGNOSIS — W19XXXD Unspecified fall, subsequent encounter: Secondary | ICD-10-CM | POA: Diagnosis not present

## 2023-07-22 DIAGNOSIS — Z6831 Body mass index (BMI) 31.0-31.9, adult: Secondary | ICD-10-CM | POA: Diagnosis not present

## 2023-07-22 DIAGNOSIS — E1122 Type 2 diabetes mellitus with diabetic chronic kidney disease: Secondary | ICD-10-CM | POA: Diagnosis not present

## 2023-07-22 DIAGNOSIS — E1165 Type 2 diabetes mellitus with hyperglycemia: Secondary | ICD-10-CM | POA: Diagnosis not present

## 2023-07-22 DIAGNOSIS — R1310 Dysphagia, unspecified: Secondary | ICD-10-CM | POA: Diagnosis not present

## 2023-07-22 DIAGNOSIS — E872 Acidosis, unspecified: Secondary | ICD-10-CM | POA: Diagnosis not present

## 2023-07-22 DIAGNOSIS — Z9049 Acquired absence of other specified parts of digestive tract: Secondary | ICD-10-CM | POA: Diagnosis not present

## 2023-07-22 DIAGNOSIS — G20A1 Parkinson's disease without dyskinesia, without mention of fluctuations: Secondary | ICD-10-CM | POA: Diagnosis not present

## 2023-07-22 DIAGNOSIS — S065X0D Traumatic subdural hemorrhage without loss of consciousness, subsequent encounter: Secondary | ICD-10-CM | POA: Diagnosis not present

## 2023-07-22 LAB — BASIC METABOLIC PANEL WITH GFR
Anion gap: 4 — ABNORMAL LOW (ref 5–15)
Anion gap: 6 (ref 5–15)
BUN: 24 mg/dL — ABNORMAL HIGH (ref 8–23)
BUN: 23 mg/dL (ref 8–23)
CO2: 22 mmol/L (ref 22–32)
CO2: 21 mmol/L — ABNORMAL LOW (ref 22–32)
Calcium: 7.4 mg/dL — ABNORMAL LOW (ref 8.9–10.3)
Calcium: 7.4 mg/dL — ABNORMAL LOW (ref 8.9–10.3)
Chloride: 110 mmol/L (ref 98–111)
Chloride: 110 mmol/L (ref 98–111)
Creatinine, Ser: 1.32 mg/dL — ABNORMAL HIGH (ref 0.61–1.24)
Creatinine, Ser: 1.4 mg/dL — ABNORMAL HIGH (ref 0.61–1.24)
GFR, Estimated: 53 mL/min — ABNORMAL LOW (ref >60)
GFR, Estimated: 50 mL/min — ABNORMAL LOW (ref >60)
Glucose, Bld: 244 mg/dL — ABNORMAL HIGH (ref 70–99)
Glucose, Bld: 141 mg/dL — ABNORMAL HIGH (ref 70–99)
Potassium: 5.4 mmol/L — ABNORMAL HIGH (ref 3.5–5.1)
Potassium: 4.9 mmol/L (ref 3.5–5.1)
Sodium: 136 mmol/L (ref 135–145)
Sodium: 137 mmol/L (ref 135–145)

## 2023-07-22 LAB — CBC
HCT: 27.2 % — ABNORMAL LOW (ref 39.0–52.0)
Hemoglobin: 8.7 g/dL — ABNORMAL LOW (ref 13.0–17.0)
MCH: 31 pg (ref 26.0–34.0)
MCHC: 32 g/dL (ref 30.0–36.0)
MCV: 96.8 fL (ref 80.0–100.0)
Platelets: 141 10*3/uL — ABNORMAL LOW (ref 150–400)
RBC: 2.81 MIL/uL — ABNORMAL LOW (ref 4.22–5.81)
RDW: 13.5 % (ref 11.5–15.5)
WBC: 5 10*3/uL (ref 4.0–10.5)
nRBC: 0.4 % — ABNORMAL HIGH (ref 0.0–0.2)

## 2023-07-22 LAB — MAGNESIUM: Magnesium: 1.8 mg/dL (ref 1.7–2.4)

## 2023-07-22 LAB — MRSA NEXT GEN BY PCR, NASAL: MRSA by PCR Next Gen: NOT DETECTED

## 2023-07-22 LAB — GLUCOSE, CAPILLARY
Glucose-Capillary: 235 mg/dL — ABNORMAL HIGH (ref 70–99)
Glucose-Capillary: 218 mg/dL — ABNORMAL HIGH (ref 70–99)
Glucose-Capillary: 148 mg/dL — ABNORMAL HIGH (ref 70–99)
Glucose-Capillary: 108 mg/dL — ABNORMAL HIGH (ref 70–99)
Glucose-Capillary: 127 mg/dL — ABNORMAL HIGH (ref 70–99)

## 2023-07-22 LAB — HEMOGLOBIN A1C
Hgb A1c MFr Bld: 6.5 % — ABNORMAL HIGH (ref 4.8–5.6)
Mean Plasma Glucose: 139.85 mg/dL

## 2023-07-22 MED ORDER — THROMBIN 5000 UNITS EX KIT
PACK | CUTANEOUS | Status: AC
  Filled 2023-07-22: qty 1

## 2023-07-22 MED ORDER — THROMBIN 5000 UNITS EX SOLR
OROMUCOSAL | Status: DC | PRN
  Administered 2023-07-22: 5 mL via TOPICAL

## 2023-07-22 MED ORDER — HYDROCODONE-ACETAMINOPHEN 5-325 MG PO TABS: 1.0000 | ORAL_TABLET | ORAL | Status: DC | PRN

## 2023-07-22 MED ORDER — ONDANSETRON HCL 4 MG PO TABS: 4.0000 mg | ORAL_TABLET | ORAL | Status: DC | PRN

## 2023-07-22 MED ORDER — LIDOCAINE-EPINEPHRINE 1 %-1:100000 IJ SOLN
INTRAMUSCULAR | Status: AC
Start: 2023-07-22 — End: ?
  Filled 2023-07-22: qty 1

## 2023-07-22 MED ORDER — ORAL CARE MOUTH RINSE
15.0000 mL | Freq: Three times a day (TID) | OROMUCOSAL | Status: DC
  Administered 2023-07-22 – 2023-07-26 (×13): 15 mL via OROMUCOSAL

## 2023-07-22 MED ORDER — SODIUM CHLORIDE 0.9 % IV SOLN: INTRAVENOUS | Status: DC | PRN

## 2023-07-22 MED ORDER — INSULIN ASPART 100 UNIT/ML IJ SOLN: 0.0000 [IU] | INTRAMUSCULAR | Status: DC

## 2023-07-22 MED ORDER — ORAL CARE MOUTH RINSE
15.0000 mL | OROMUCOSAL | Status: DC | PRN
  Administered 2023-07-23: 15 mL via OROMUCOSAL

## 2023-07-22 MED ORDER — DEXAMETHASONE SODIUM PHOSPHATE 10 MG/ML IJ SOLN
INTRAMUSCULAR | Status: DC | PRN
  Administered 2023-07-22: 5 mg via INTRAVENOUS

## 2023-07-22 MED ORDER — EPHEDRINE SULFATE-NACL 50-0.9 MG/10ML-% IV SOSY
PREFILLED_SYRINGE | INTRAVENOUS | Status: DC | PRN
  Administered 2023-07-22 (×3): 5 mg via INTRAVENOUS

## 2023-07-22 MED ORDER — EPHEDRINE 5 MG/ML INJ
INTRAVENOUS | Status: AC
  Filled 2023-07-22: qty 5

## 2023-07-22 MED ORDER — THROMBIN 20000 UNITS EX SOLR
CUTANEOUS | Status: AC
  Filled 2023-07-22: qty 20000

## 2023-07-22 MED ORDER — CARBIDOPA-LEVODOPA 25-100 MG PO TABS
1.0000 | ORAL_TABLET | Freq: Three times a day (TID) | ORAL | Status: DC
  Administered 2023-07-22 – 2023-07-23 (×4): 1 via ORAL
  Filled 2023-07-22 (×5): qty 1

## 2023-07-22 MED ORDER — INSULIN ASPART 100 UNIT/ML IJ SOLN
0.0000 [IU] | Freq: Three times a day (TID) | INTRAMUSCULAR | Status: DC
  Administered 2023-07-22: 2 [IU] via SUBCUTANEOUS
  Administered 2023-07-22 (×2): 5 [IU] via SUBCUTANEOUS
  Administered 2023-07-23: 3 [IU] via SUBCUTANEOUS
  Administered 2023-07-23 (×2): 2 [IU] via SUBCUTANEOUS
  Administered 2023-07-24 (×3): 3 [IU] via SUBCUTANEOUS
  Administered 2023-07-25: 8 [IU] via SUBCUTANEOUS

## 2023-07-22 MED ORDER — GABAPENTIN 250 MG/5ML PO SOLN
300.0000 mg | Freq: Two times a day (BID) | ORAL | Status: DC
  Administered 2023-07-22: 300 mg
  Filled 2023-07-22 (×2): qty 6

## 2023-07-22 MED ORDER — DOCUSATE SODIUM 50 MG/5ML PO LIQD: 100.0000 mg | Freq: Two times a day (BID) | ORAL | Status: DC

## 2023-07-22 MED ORDER — POLYETHYLENE GLYCOL 3350 17 G PO PACK: 17.0000 g | PACK | Freq: Every day | ORAL | Status: DC

## 2023-07-22 MED ORDER — PROPOFOL 1000 MG/100ML IV EMUL
0.0000 ug/kg/min | INTRAVENOUS | Status: DC
  Administered 2023-07-22: 30 ug/kg/min via INTRAVENOUS
  Filled 2023-07-22: qty 100

## 2023-07-22 MED ORDER — GABAPENTIN 250 MG/5ML PO SOLN
300.0000 mg | Freq: Every day | ORAL | Status: DC
Start: 2023-07-22 — End: 2023-07-22

## 2023-07-22 MED ORDER — SODIUM ZIRCONIUM CYCLOSILICATE 5 G PO PACK
5.0000 g | PACK | Freq: Once | ORAL | Status: AC
  Administered 2023-07-22: 5 g
  Filled 2023-07-22: qty 1

## 2023-07-22 MED ORDER — LIDOCAINE-EPINEPHRINE 1 %-1:100000 IJ SOLN
INTRAMUSCULAR | Status: DC | PRN
  Administered 2023-07-22: 10 mL

## 2023-07-22 MED ORDER — HYDROCODONE-ACETAMINOPHEN 5-325 MG PO TABS
1.0000 | ORAL_TABLET | ORAL | Status: DC | PRN
  Administered 2023-07-22 – 2023-07-23 (×3): 1 via ORAL
  Filled 2023-07-22 (×3): qty 1

## 2023-07-22 MED ORDER — CEFAZOLIN SODIUM-DEXTROSE 2-4 GM/100ML-% IV SOLN
2.0000 g | Freq: Three times a day (TID) | INTRAVENOUS | Status: AC
  Administered 2023-07-22 (×2): 2 g via INTRAVENOUS
  Filled 2023-07-22 (×2): qty 100

## 2023-07-22 MED ORDER — CLEVIDIPINE BUTYRATE 0.5 MG/ML IV EMUL
0.0000 mg/h | INTRAVENOUS | Status: DC
  Filled 2023-07-22: qty 100

## 2023-07-22 MED ORDER — CHLORHEXIDINE GLUCONATE CLOTH 2 % EX PADS
6.0000 | MEDICATED_PAD | Freq: Every day | CUTANEOUS | Status: DC
  Administered 2023-07-22 – 2023-08-11 (×24): 6 via TOPICAL

## 2023-07-22 MED ORDER — ROCURONIUM BROMIDE 10 MG/ML (PF) SYRINGE
PREFILLED_SYRINGE | INTRAVENOUS | Status: DC | PRN
  Administered 2023-07-22 (×2): 50 mg via INTRAVENOUS

## 2023-07-22 MED ORDER — CEFAZOLIN SODIUM-DEXTROSE 2-3 GM-%(50ML) IV SOLR
INTRAVENOUS | Status: DC | PRN
Start: 2023-07-22 — End: 2023-07-22
  Administered 2023-07-22: 2 g via INTRAVENOUS

## 2023-07-22 MED ORDER — DOCUSATE SODIUM 50 MG/5ML PO LIQD
100.0000 mg | Freq: Two times a day (BID) | ORAL | Status: DC
  Administered 2023-07-22: 100 mg
  Filled 2023-07-22: qty 10

## 2023-07-22 MED ORDER — FENTANYL CITRATE PF 50 MCG/ML IJ SOSY
25.0000 ug | PREFILLED_SYRINGE | Freq: Once | INTRAMUSCULAR | Status: DC
Start: 2023-07-22 — End: 2023-07-22

## 2023-07-22 MED ORDER — THROMBIN 20000 UNITS EX SOLR
CUTANEOUS | Status: DC | PRN
  Administered 2023-07-22: 20 mL via TOPICAL

## 2023-07-22 MED ORDER — DULOXETINE HCL 60 MG PO CPEP
60.0000 mg | ORAL_CAPSULE | Freq: Every day | ORAL | Status: DC
  Administered 2023-07-22: 60 mg via ORAL
  Filled 2023-07-22: qty 1

## 2023-07-22 MED ORDER — ACETAMINOPHEN 650 MG RE SUPP: 650.0000 mg | RECTAL | Status: DC | PRN

## 2023-07-22 MED ORDER — ORAL CARE MOUTH RINSE
15.0000 mL | OROMUCOSAL | Status: DC | PRN
  Administered 2023-07-24: 15 mL via OROMUCOSAL

## 2023-07-22 MED ORDER — TAMSULOSIN HCL 0.4 MG PO CAPS
0.4000 mg | ORAL_CAPSULE | Freq: Every day | ORAL | Status: DC
  Administered 2023-07-22 – 2023-07-23 (×2): 0.4 mg via ORAL
  Filled 2023-07-22 (×2): qty 1

## 2023-07-22 MED ORDER — SODIUM CHLORIDE 0.9 % IV SOLN: INTRAVENOUS | Status: AC

## 2023-07-22 MED ORDER — FAMOTIDINE 20 MG PO TABS
20.0000 mg | ORAL_TABLET | Freq: Two times a day (BID) | ORAL | Status: DC
  Administered 2023-07-22: 20 mg
  Filled 2023-07-22: qty 1

## 2023-07-22 MED ORDER — FAMOTIDINE 20 MG PO TABS
20.0000 mg | ORAL_TABLET | Freq: Two times a day (BID) | ORAL | Status: DC
  Administered 2023-07-22 – 2023-07-23 (×2): 20 mg via ORAL
  Filled 2023-07-22 (×2): qty 1

## 2023-07-22 MED ORDER — LEVETIRACETAM (KEPPRA) 500 MG/5 ML ADULT IV PUSH
500.0000 mg | Freq: Two times a day (BID) | INTRAVENOUS | Status: DC
  Administered 2023-07-22 – 2023-07-25 (×6): 500 mg via INTRAVENOUS
  Filled 2023-07-22 (×6): qty 5

## 2023-07-22 MED ORDER — SODIUM CHLORIDE 0.9 % IV SOLN
INTRAVENOUS | Status: DC | PRN
Start: 2023-07-22 — End: 2023-07-22

## 2023-07-22 MED ORDER — GABAPENTIN 300 MG PO CAPS: 300.0000 mg | ORAL_CAPSULE | Freq: Two times a day (BID) | ORAL | Status: DC

## 2023-07-22 MED ORDER — METFORMIN HCL 500 MG PO TABS
1000.0000 mg | ORAL_TABLET | Freq: Two times a day (BID) | ORAL | Status: DC
  Administered 2023-07-22: 1000 mg
  Filled 2023-07-22: qty 2

## 2023-07-22 MED ORDER — FENTANYL BOLUS VIA INFUSION
25.0000 ug | INTRAVENOUS | Status: DC | PRN
  Administered 2023-07-22: 50 ug via INTRAVENOUS

## 2023-07-22 MED ORDER — PHENYLEPHRINE 80 MCG/ML (10ML) SYRINGE FOR IV PUSH (FOR BLOOD PRESSURE SUPPORT)
PREFILLED_SYRINGE | INTRAVENOUS | Status: DC | PRN
  Administered 2023-07-22 (×9): 80 ug via INTRAVENOUS

## 2023-07-22 MED ORDER — METOPROLOL SUCCINATE ER 25 MG PO TB24
25.0000 mg | ORAL_TABLET | Freq: Every day | ORAL | Status: DC
  Administered 2023-07-22: 25 mg via ORAL
  Filled 2023-07-22: qty 1

## 2023-07-22 MED ORDER — DEXAMETHASONE SODIUM PHOSPHATE 10 MG/ML IJ SOLN
INTRAMUSCULAR | Status: AC
  Filled 2023-07-22: qty 1

## 2023-07-22 MED ORDER — PROPOFOL 10 MG/ML IV BOLUS
INTRAVENOUS | Status: AC
  Filled 2023-07-22: qty 20

## 2023-07-22 MED ORDER — PROMETHAZINE HCL 25 MG PO TABS
12.5000 mg | ORAL_TABLET | ORAL | Status: DC | PRN
  Administered 2023-07-22: 12.5 mg via ORAL
  Administered 2023-07-23 (×2): 25 mg via ORAL
  Filled 2023-07-22 (×5): qty 1

## 2023-07-22 MED ORDER — 0.9 % SODIUM CHLORIDE (POUR BTL) OPTIME
TOPICAL | Status: DC | PRN
  Administered 2023-07-22: 3000 mL

## 2023-07-22 MED ORDER — POLYETHYLENE GLYCOL 3350 17 G PO PACK
17.0000 g | PACK | Freq: Every day | ORAL | Status: DC
  Administered 2023-07-22: 17 g
  Filled 2023-07-22: qty 1

## 2023-07-22 MED ORDER — DOXAZOSIN MESYLATE 4 MG PO TABS
4.0000 mg | ORAL_TABLET | Freq: Every day | ORAL | Status: DC
  Administered 2023-07-22 – 2023-08-10 (×20): 4 mg
  Filled 2023-07-22 (×21): qty 1

## 2023-07-22 MED ORDER — ONDANSETRON HCL 4 MG/2ML IJ SOLN: INTRAMUSCULAR | Status: DC | PRN

## 2023-07-22 MED ORDER — ROCURONIUM BROMIDE 10 MG/ML (PF) SYRINGE
PREFILLED_SYRINGE | INTRAVENOUS | Status: AC
  Filled 2023-07-22: qty 10

## 2023-07-22 MED ORDER — ATORVASTATIN CALCIUM 10 MG PO TABS
10.0000 mg | ORAL_TABLET | Freq: Every day | ORAL | Status: DC
  Administered 2023-07-23 – 2023-08-09 (×17): 10 mg
  Filled 2023-07-22 (×18): qty 1

## 2023-07-22 MED ORDER — GABAPENTIN 250 MG/5ML PO SOLN
300.0000 mg | Freq: Two times a day (BID) | ORAL | Status: DC
  Administered 2023-07-22 – 2023-07-23 (×2): 300 mg via ORAL
  Filled 2023-07-22 (×3): qty 6

## 2023-07-22 MED ORDER — CARBAMAZEPINE ER 200 MG PO TB12: 200.0000 mg | ORAL_TABLET | Freq: Three times a day (TID) | ORAL | Status: DC

## 2023-07-22 MED ORDER — BACITRACIN ZINC 500 UNIT/GM EX OINT
TOPICAL_OINTMENT | Freq: Every day | CUTANEOUS | Status: DC
  Administered 2023-07-22 – 2023-07-25 (×4): 1 via TOPICAL
  Administered 2023-07-26 – 2023-08-02 (×7): 31.5 via TOPICAL
  Administered 2023-08-03: 1 via TOPICAL
  Administered 2023-08-04 – 2023-08-14 (×10): 31.5 via TOPICAL
  Filled 2023-07-22: qty 2.7
  Filled 2023-07-22 (×4): qty 0.9
  Filled 2023-07-22: qty 28.4

## 2023-07-22 MED ORDER — ORAL CARE MOUTH RINSE
15.0000 mL | OROMUCOSAL | Status: DC
  Administered 2023-07-22 (×5): 15 mL via OROMUCOSAL

## 2023-07-22 MED ORDER — ACETAMINOPHEN 325 MG PO TABS
650.0000 mg | ORAL_TABLET | ORAL | Status: DC | PRN
  Administered 2023-07-22: 650 mg via ORAL
  Filled 2023-07-22: qty 2

## 2023-07-22 MED ORDER — MORPHINE SULFATE (PF) 2 MG/ML IV SOLN
1.0000 mg | INTRAVENOUS | Status: DC | PRN
  Administered 2023-07-22 (×2): 2 mg via INTRAVENOUS
  Administered 2023-07-23: 1 mg via INTRAVENOUS
  Filled 2023-07-22 (×3): qty 1

## 2023-07-22 MED ORDER — CARBIDOPA-LEVODOPA 25-100 MG PO TABS
1.0000 | ORAL_TABLET | Freq: Three times a day (TID) | ORAL | Status: DC
  Administered 2023-07-22: 1
  Filled 2023-07-22 (×3): qty 1

## 2023-07-22 MED ORDER — LEVETIRACETAM (KEPPRA) 500 MG/5 ML ADULT IV PUSH
1000.0000 mg | INTRAVENOUS | Status: AC
  Administered 2023-07-22: 1000 mg via INTRAVENOUS
  Filled 2023-07-22: qty 10

## 2023-07-22 MED ORDER — MONTELUKAST SODIUM 10 MG PO TABS
10.0000 mg | ORAL_TABLET | Freq: Every day | ORAL | Status: DC
  Administered 2023-07-22: 10 mg via ORAL
  Filled 2023-07-22: qty 1

## 2023-07-22 MED ORDER — CEFAZOLIN SODIUM 1 G IJ SOLR
INTRAMUSCULAR | Status: AC
  Filled 2023-07-22: qty 20

## 2023-07-22 MED ORDER — FENTANYL 2500MCG IN NS 250ML (10MCG/ML) PREMIX INFUSION: 0.0000 ug/h | INTRAVENOUS | Status: DC

## 2023-07-22 MED ORDER — PANTOPRAZOLE SODIUM 40 MG PO TBEC: 40.0000 mg | DELAYED_RELEASE_TABLET | Freq: Every day | ORAL | Status: DC

## 2023-07-22 MED ORDER — MONTELUKAST SODIUM 10 MG PO TABS: 10.0000 mg | ORAL_TABLET | Freq: Every day | ORAL | Status: DC

## 2023-07-22 MED ORDER — ONDANSETRON HCL 4 MG/2ML IJ SOLN
4.0000 mg | INTRAMUSCULAR | Status: DC | PRN
  Administered 2023-07-22 (×2): 4 mg via INTRAVENOUS
  Filled 2023-07-22 (×2): qty 2

## 2023-07-22 MED ORDER — PROMETHAZINE HCL 25 MG PO TABS: 12.5000 mg | ORAL_TABLET | ORAL | Status: DC | PRN

## 2023-07-22 MED ORDER — HYDRALAZINE HCL 20 MG/ML IJ SOLN
10.0000 mg | INTRAMUSCULAR | Status: DC | PRN
  Administered 2023-07-22 – 2023-07-28 (×6): 10 mg via INTRAVENOUS
  Filled 2023-07-22 (×8): qty 1

## 2023-07-22 NOTE — Transfer of Care (Signed)
 Immediate Anesthesia Transfer of Care Note  Patient: Jack White  Procedure(s) Performed: CRANIOTOMY HEMATOMA EVACUATION SUBDURAL (Right)  Patient Location: ICU  Anesthesia Type:General  Level of Consciousness: Patient remains intubated per anesthesia plan  Airway & Oxygen Therapy: Patient remains intubated per anesthesia plan and Patient placed on Ventilator (see vital sign flow sheet for setting)  Post-op Assessment: Report given to RN and Post -op Vital signs reviewed and stable  Post vital signs: Reviewed and stable  Last Vitals:  Vitals Value Taken Time  BP 127/66 07/22/23 0217  Temp    Pulse 73 07/22/23 0226  Resp 16 07/22/23 0226  SpO2 100 % 07/22/23 0226  Vitals shown include unfiled device data.  Last Pain:  Vitals:   07/21/23 2328  TempSrc: Axillary  PainSc:          Complications: No notable events documented.

## 2023-07-22 NOTE — Evaluation (Signed)
 Clinical/Bedside Swallow Evaluation Patient Details  Name: NIRVAAN FRETT MRN: [id number] Date of Birth: [birth year]  Today's Date: 07/22/2023 Time: SLP Start Time (ACUTE ONLY): 1550 SLP Stop Time (ACUTE ONLY): 1607 SLP Time Calculation (min) (ACUTE ONLY): 17 min  Past Medical History:  Past Medical History:  Diagnosis Date   Acute cholecystitis 05/21/14   BP (high blood pressure) 10/14/2014   Chronic kidney disease (CKD), stage III (moderate) (HCC) 02/15/2014   Colon perforation (HCC) 05/28/2014   Diabetes mellitus without complication (HCC)    [name]'s neuralgia 10/14/2014   Headache    Hypertension    Intractable nausea and vomiting 04/07/2023   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 Luise Saint   COLOSTOMY TAKEDOWN N/A 10/26/2014   Procedure: COLOSTOMY TAKEDOWN;  Surgeon: Rhina Center III, MD;  Location: [facility] ORS;  Service: General;  Laterality: N/A;   EXPLORATORY LAPAROTOMY  05/28/2014   Dr. Luise Saint   LAPAROSCOPIC CHOLECYSTECTOMY  05/21/2014   Dr. Amalia Badder   HPI:  Patient is an 83 y.o. male with PMH: possible Parkinson's disease, trigeminal neuralgia, DM. He presented to the ED via EMS as a code stroke after becoming unresponsive at home. He was intubated upon arrival. CT head showed large right holohemispheric acute SDH. Neurosurgery was consulted and patient underwent a  right craniotomy on 07/22/23. He was extubated 5/27 at 1136 to 2L via nasal cannula. He failed [facility] swallow screen with RN and SLP swallow evaluation ordered.    Assessment / Plan / Recommendation  Clinical Impression  Patient presents with clinical s/s of dysphagia as per this bedside swallow evaluation but with impact from current state of lethargy s/p craniotomy and intubation. Per son in law in room, patient does not have a h/o difficulty with swallowing. Patient did not exhibit any overt s/s aspiration with sips of  thin liquids but he exhibited oral holding, would swish water in mouth prior to swallowing for each sip; swallow initiation delay is suspected. SLP recommending continue NPO status, allow floor stock thin liquids if awake/alert and necessary meds crushed in puree. SLP will follow for PO readiness. SLP Visit Diagnosis: Dysphagia, unspecified (R13.10)    Aspiration Risk  Mild aspiration risk    Diet Recommendation NPO;Other (Comment) (floor stock thin liquids when alert)    Medication Administration: Crushed with puree Supervision: Full supervision/cueing for compensatory strategies Compensations: Slow rate;Small sips/bites Postural Changes: Seated upright at 90 degrees    Other  Recommendations Oral Care Recommendations: Oral care BID;Oral care before and after PO    Recommendations for follow up therapy are one component of a multi-disciplinary discharge planning process, led by the attending physician.  Recommendations may be updated based on patient status, additional functional criteria and insurance authorization.  Follow up Recommendations Other (comment) (TBD)      Assistance Recommended at Discharge    Functional Status Assessment Patient has had a recent decline in their functional status and demonstrates the ability to make significant improvements in function in a reasonable and predictable amount of time.  Frequency and Duration min 2x/week  2 weeks       Prognosis Prognosis for improved oropharyngeal function: Good      Swallow Study   General Date of Onset: 07/22/23 HPI: Patient is an 83 y.o. male with PMH: possible Parkinson's disease, trigeminal neuralgia, DM. He presented to the ED via EMS as a code stroke after becoming unresponsive at  home. He was intubated upon arrival. CT head showed large right holohemispheric acute SDH. Neurosurgery was consulted and patient underwent a  right craniotomy on 07/22/23. He was extubated 5/27 at 1136 to 2L via nasal cannula. He  failed [facility] swallow screen with RN and SLP swallow evaluation ordered. Type of Study: Bedside Swallow Evaluation Previous Swallow Assessment: none found Diet Prior to this Study: NPO Temperature Spikes Noted: No Respiratory Status: Nasal cannula History of Recent Intubation: Yes Total duration of intubation (days): 1 days Date extubated: 07/22/23 Behavior/Cognition: Alert;Lethargic/Drowsy;Cooperative Oral Cavity Assessment: Within Functional Limits Oral Care Completed by SLP: Recent completion by staff Oral Cavity - Dentition: Dentures, not available;Edentulous Self-Feeding Abilities: Needs set up;Needs assist Patient Positioning: Upright in bed Baseline Vocal Quality: Low vocal intensity Volitional Cough: Weak Volitional Swallow: Able to elicit    Oral/Motor/Sensory Function Overall Oral Motor/Sensory Function: Within functional limits   Ice Chips     Thin Liquid Thin Liquid: Impaired Presentation: Straw Oral Phase Impairments: Reduced labial seal;Poor awareness of bolus Oral Phase Functional Implications: Prolonged oral transit;Oral holding Pharyngeal  Phase Impairments: Suspected delayed Swallow    Nectar Thick     Honey Thick     Puree Presentation: Spoon Other Comments: patient accepted only a very small amount of applesauce into mouth and not enough for adequate assessment   Solid     Solid: Not tested      Jacqualine Mater, MA, CCC-SLP Speech Therapy

## 2023-07-22 NOTE — Op Note (Signed)
 NEUROSURGERY OPERATIVE NOTE   PREOP DIAGNOSIS: Acute right subdural hematoma  POSTOP DIAGNOSIS: Same  PROCEDURE: 1. Right frontoparietal craniotomy for evacuation of subdural hematoma  SURGEON: Dr. Augusto Blonder, MD  ASSISTANT: Easter Golden, PA-C  ANESTHESIA: General Endotracheal  EBL: 100cc  SPECIMENS: None  DRAINS: Subdural JP  COMPLICATIONS: None immediate  CONDITION: Hemodynamically stable to ICU  HISTORY: Jack White is a 83 y.o. male brought into the hospital via EMS initially as a code stroke with left hemiplegia.  His CT scan demonstrated a large acute right holohemispheric subdural hematoma with significant mass effect explaining his symptoms.  Patient was intubated in the emergency department for airway protection.  Given his presentation and the radiographic findings, emergent operative decompression was indicated.  The risks, benefits, and alternatives to surgery as well as the expected postoperative course and recovery were all reviewed in detail with the patient's daughter.  After all her questions were answered she provided informed consent on the patient's behalf.  PROCEDURE IN DETAIL: After informed consent was obtained and witnessed, the patient was brought to the operating room. After induction of general anesthesia, the patient was positioned on the operative table in the supine position. All pressure points were meticulously padded.  Standard curvilinear right sided skin incision was then marked out and prepped and draped in the usual sterile fashion.  After timeout was conducted, the skin incision was infiltrated with local anesthetic. Skin incision was then made sharply, and Bovie electrocautery was used to dissect the subcutaneous tissue and the galea was incised. Hemostasis was achieved on the skin edges with Raney clips. A single piece myocutaneous flap was then elevated and retracted anteriorly. Bur holes were then created and a craniotomy was  fashioned and elevated. Hemostasis was then achieved on the dural surface with bipolar electrocautery. The dura was then opened in a stellate fashion.  Immediately subjacent to the dura I encountered a large acute subdural hematoma under some tension.  This was easily evacuated using a combination of suction, blunt dissection, and irrigation.  In this fashion, I was able to remove the subdural hematoma underlying the craniotomy.  I identified a small cortical artery which was actively bleeding possibly explaining the subdural hematoma.  This was easily controlled with bipolar electrocautery.  I was then able to work anteriorly and remove the entire subdural hematoma and visualize the anterior frontal pole.  In a similar fashion, the more inferior and posterior extent of the hematoma was removed.  I then was able to visualize somewhat more medially to the level of the falx and remove hematoma in this region.  At this point, the subdural space was irrigated with a red rubber catheter and a 3 L bag of normal saline.  Irrigation was clear, without any active bleeding.  A subdural drain was then placed and tunneled subcutaneously.  The dural leaflets were then reapproximated with interrupted 4-0 Nurolon stitches.  A piece of Gelfoam was placed over the dural surface.  The bone flap was then replaced and plated with standard titanium plates and screws.  The wound was again irrigated.  The temporalis muscle and fascia were reapproximated with interrupted 0 Vicryl stitches.  The galea was reapproximated with interrupted 0 and 3-0 Vicryl stitches and the skin was closed with staples.  The drain was secured with Vicryl.  Sterile dressing, and a head wrap was then applied.  At the end of the case all sponge, needle, instrument, and cottonoid counts were correct.  The patient was then  transferred to the stretcher and taken to the neurointensive care unit in stable hemodynamic condition.  Augusto Blonder, MD [facility]  [facility]

## 2023-07-22 NOTE — Procedures (Signed)
 Extubation Procedure Note  Patient Details:   Name: Jack White DOB: [birth year] MRN: [id number]   Airway Documentation:    Vent end date: 07/22/23 Vent end time: 1136   Evaluation  O2 sats: stable throughout Complications: No apparent complications Patient did tolerate procedure well. Bilateral Breath Sounds: Clear, Diminished   Yes  Pt extubated to 2l [place] with RN and CCM at bedside. Positive cuff leak noted and pt is tolerating well. RT will monitor.  Gustav Lehmann 07/22/2023, 12:12 PM

## 2023-07-22 NOTE — Progress Notes (Signed)
 PA [facility] Neurosurgery notified that patient has put out 340 mL of blood from the JP drain since arrival to 4N ICU at 0213. PA Maritza Sidles updated on patient's current vital signs. PA Maritza Sidles said she would let the MD know. Awaiting any new orders.

## 2023-07-22 NOTE — Consult Note (Signed)
 NAME:  Jack White, MRN:  [id number], DOB:  1940-06-14, LOS: 0 ADMISSION DATE:  07/21/2023, CONSULTATION DATE:  5/27 REFERRING MD:  Dr. Harless Lien, CHIEF COMPLAINT:  SDH   History of Present Illness:  Patient is an 83 yo M w/ pertinent PMH HTN, HLD, DMT2, parkinson disease, trigeminal neuralgia, CKD 3a presents to [facility] on 5/26 w/ SDH.  On 5/26 patient fell around 9 pm. Daughter states he was initially okay just having HA. About 2 hours later having N/V and became altered. EMS came finding patient w/ left arm/leg weakness. Brought to [facility] as code stroke. On arrival normotensive and afebrile. Cbg 144. CT showing large right holohemispheric acute SDH; subfacline herniation and impending uncal herniation. NSG consulted w/ plans to take to OR for emergent craniotomy. Started on cleviprex for SBP <140. Given Keppra 1000. Patient intubated for airway protection. PCCM consulted for vent/medical management.  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 Intubated for airway protection. NSG taking to OR. Pccm consulted  Interim History / Subjective:  See above  Objective    Blood pressure 125/63, pulse 64, temperature (!) 95.8 F (35.4 C), temperature source Axillary, resp. rate 18, height 6' (1.829 m), weight 105 kg, SpO2 100%.    Vent Mode: PRVC FiO2 (%):  [60 %] 60 % Set Rate:  [18 bmp] 18 bmp Vt Set:  [620 mL] 620 mL PEEP:  [5 cmH20] 5 cmH20 Plateau Pressure:  [21 cmH20]  21 cmH20  No intake or output data in the 24 hours ending 07/22/23 0057 Filed Weights   07/21/23 2300  Weight: 105 kg    Examination: General: critically ill appearing on mech vent HEENT: MM pink/moist; ETT in place Neuro: sedate; perrl CV: s1s2, RRR, no m/r/g PULM:  dim clear BS bilaterally;; on mech vent PRVC GI: soft, bsx4 active  Extremities: warm/dry; left heal with dressing in place showing superficial ulcer wound    Resolved problem list   Assessment and Plan   SDH Plan: -NSG taken to OR for crani; appreciate recs -repeat imaging per NSG -AED for seizure ppx -frequent neuro checks -limit sedating meds -cleviprex; sbp goal <140 per nsg -statin -Continue neuroprotective measures- normothermia, euglycemia, HOB greater than 30, head in neutral alignment, normocapnia, normoxia.  -pt/ot/slp when appropriate -scds for dvt ppx  Acute respiratory failure Plan: -CXR post intubation showing ETT just entering right mainstem; retract 2 cm; repeat CXR -LTVV strategy with tidal volumes of 6-8 cc/kg ideal body weight -check ABG and adjust settings accordingly  -Wean PEEP/FiO2 for SpO2 >92% -VAP bundle in place -Daily SAT and SBT -PAD protocol in place -wean sedation for RASS goal 0 to -1  T2DM Plan: -ssi and cbg monitoring -a1c  CKD 3a Mild hyperkalemia Plan: -lokelma -Trend BMP / urinary output -Replace electrolytes as indicated -Avoid nephrotoxic agents, ensure adequate renal perfusion  Parkinson disease Trigeminal neuralgia Peripheral neuropathy Plan: -resume sinemet , cmybalta, and tegretol  -hold gabapentin   HTN HLD Plan: -resume statin -hold  home anti-htn meds; cleviprex as above  BPH Plan: -resume home flomax /doxazosin   Left heal ulcer poa -seen by podiatry at [facility] on 5/22 s/p debridement Plan: -woc consult  Best Practice (right click and "Reselect all SmartList Selections" daily)   Diet/type: NPO w/ meds via tube DVT prophylaxis  SCD Pressure ulcer(s): present on admission  GI prophylaxis: H2B Lines: N/A Foley:  N/A Code Status:  full code Last date of multidisciplinary goals of care discussion [5/27 spoke w/ daughter at bedside. States to do everything for now but patient would not want to be on vent long term.]  Labs   CBC: Recent Labs  Lab 07/21/23 2309 07/21/23 2311  WBC  --  7.8  NEUTROABS  --  5.6  HGB 10.9* 10.6*  HCT 32.0* 32.9*  MCV  --  97.6  PLT  --  140*    Basic Metabolic Panel: Recent Labs  Lab 07/21/23 2309 07/21/23 2311  NA 139 137  K 5.2* 5.2*  CL 107 106  CO2  --  23  GLUCOSE 159* 161*  BUN 26* 25*  CREATININE 1.50* 1.47*  CALCIUM   --  8.2*   GFR: Estimated Creatinine Clearance: 46.9 mL/min (A) (by C-G formula based on SCr of 1.47 mg/dL (H)). Recent Labs  Lab 07/21/23 2311  WBC 7.8    Liver Function Tests: Recent Labs  Lab 07/21/23 2311  AST 25  ALT 6  ALKPHOS 117  BILITOT 0.7  PROT 7.0  ALBUMIN 3.0*   No results for input(s): "LIPASE", "AMYLASE" in the last 168 hours. No results for input(s): "AMMONIA" in the last 168 hours.  ABG    Component Value Date/Time   TCO2 24 07/21/2023 2309     Coagulation Profile: Recent Labs  Lab 07/21/23 2327  INR 1.2    Cardiac Enzymes: No results for input(s): "CKTOTAL", "CKMB", "CKMBINDEX", "TROPONINI" in the last 168 hours.  HbA1C: No results found for: "HGBA1C"  CBG: Recent Labs  Lab 07/21/23 2305  GLUCAP 144*    Review of Systems:   Patient is sedate and/or intubated; therefore, history has been obtained from chart review.    Past Medical History:  He,  has a past medical history of Acute cholecystitis (05/21/14), BP (high blood pressure) (10/14/2014), Chronic kidney disease (CKD), stage III (moderate) (HCC) (02/15/2014), Colon perforation (HCC) (05/28/2014), Diabetes mellitus without complication (HCC), [name]'s neuralgia (10/14/2014), Headache, Hypertension, Intractable nausea and vomiting  (04/07/2023), and Multiple gastric ulcers (10/14/2014).   Surgical History:   Past Surgical History:  Procedure Laterality Date   COLOSTOMY  05/28/2014   Dr. Luise Saint   COLOSTOMY TAKEDOWN N/A 10/26/2014   Procedure: COLOSTOMY TAKEDOWN;  Surgeon: Rhina Center III, MD;  Location: [facility] ORS;  Service: General;  Laterality: N/A;   EXPLORATORY LAPAROTOMY  05/28/2014   Dr. Luise Saint   LAPAROSCOPIC CHOLECYSTECTOMY  05/21/2014   Dr. Amalia Badder     Social History:   reports that he has never smoked. He has never used smokeless tobacco. He reports that he does not drink alcohol and does not use drugs.   Family History:  His family history includes Alzheimer's disease in his mother; Diabetes in his mother and sister; Heart disease in his father; Hypertension in his mother.   Allergies Allergies  Allergen Reactions   Citrus    Dilaudid [Hydromorphone Hcl] Other (See Comments)    Respiratory arrest   Tomato Other (See Comments)    GI distress, respiratory distress     Home Medications  Prior to Admission medications  Medication Sig Start Date End Date Taking? Authorizing Provider  aspirin  81 MG tablet Take 81 mg by mouth daily.    [provider]  atorvastatin  (LIPITOR) 10 MG tablet Take 5 mg by mouth at bedtime. 10/03/14   [provider]  carbamazepine  (TEGRETOL  XR) 200 MG 12 hr tablet Take 1 tablet (200 mg total) by mouth 3 (three) times daily. 07/16/23   Tat, Von Grumbling, DO  carbidopa -levodopa  (SINEMET  IR) 25-100 MG tablet Take 1 tablet by mouth 3 (three) times daily. 11/13/22 11/13/23  [provider]  doxazosin  (CARDURA ) 4 MG tablet Take 1 tablet by mouth daily. 10/17/14   [provider]  DULoxetine  (CYMBALTA ) 60 MG capsule Take 60 mg by mouth daily.    [provider]  ergocalciferol (VITAMIN D2) 1.25 MG (50000 UT) capsule Take 50,000 Units by mouth once a week.    [provider]  fluticasone  (FLONASE ) 50 MCG/ACT nasal spray Place 2 sprays into both  nostrils daily. 12/10/22 12/10/23  [provider]  gabapentin  (NEURONTIN ) 300 MG capsule Take 300 mg by mouth 2 (two) times daily. 08/17/14   [provider]  hydrocortisone  cream 1 % Apply topically 3 (three) times daily. 06/26/14   Lauretta Ponto, MD  metFORMIN (GLUCOPHAGE) 1000 MG tablet Take 1,000 mg by mouth 2 (two) times daily with a meal.    [provider]  metoprolol  succinate (TOPROL -XL) 25 MG 24 hr tablet Take 1 tablet by mouth daily. 03/24/23 03/23/24  [provider]  montelukast  (SINGULAIR ) 10 MG tablet Take 10 mg by mouth at bedtime.    [provider]  omeprazole (PRILOSEC) 20 MG capsule Take 1 capsule by mouth daily. 08/17/14   [provider]  ondansetron  (ZOFRAN ) 4 MG tablet Take 1 tablet (4 mg total) by mouth every 6 (six) hours as needed for nausea. 04/08/23   Tiajuana Fluke, MD  tamsulosin  (FLOMAX ) 0.4 MG CAPS capsule Take 0.4 mg by mouth daily after breakfast. 03/05/23 03/04/24  [provider]     Critical care time: 45 minutes     JD Carliss Chess [facility] 07/22/2023, 12:57 AM  Please see [email or web address] for pager details.  From 7A-7P if no response, please call [phone]. After hours, please call ELink [phone].

## 2023-07-22 NOTE — Progress Notes (Signed)
  NEUROSURGERY PROGRESS NOTE   Pt seen and examined. No issues overnight. Intubated on vent, improving on SBT.  EXAM: Temp:  [95.8 F (35.4 C)-97.2 F (36.2 C)] 97.2 F (36.2 C) (05/27 0800) Pulse Rate:  [56-97] 62 (05/27 1015) Resp:  [0-28] 18 (05/27 1015) BP: (101-166)/(41-95) 107/93 (05/27 1015) SpO2:  [93 %-100 %] 100 % (05/27 1015) FiO2 (%):  [60 %] 60 % (05/27 0321) Weight:  [105 kg] 105 kg (05/26 2300) Intake/Output      05/26 0701 05/27 0700 05/27 0701 05/28 0700   I.V. (mL/kg) 2120.5 (20.2) 111.4 (1.1)   NG/GT 130    IV Piggyback 200    Total Intake(mL/kg) 2450.5 (23.3) 111.4 (1.1)   Urine (mL/kg/hr) 575    Drains 420 75   Blood 100    Total Output 1095 75   Net +1355.5 +36.4         Easily arouses Follows commands RUE, BLE. Minimal movement LUE Headwrap in place, JP with ~400cc since OR 8 hrs ago  LABS: Lab Results  Component Value Date   CREATININE 1.32 (H) 07/22/2023   BUN 24 (H) 07/22/2023   NA 136 07/22/2023   K 5.4 (H) 07/22/2023   CL 110 07/22/2023   CO2 22 07/22/2023   Lab Results  Component Value Date   WBC 5.0 07/22/2023   HGB 8.7 (L) 07/22/2023   HCT 27.2 (L) 07/22/2023   MCV 96.8 07/22/2023   PLT 141 (L) 07/22/2023    IMAGING: CTH reviewed demonstrating residual right convexity mixed density SDH, decreased in size now ~1.5cm thickness. MLS improved now about 1cm. No HCP. Subdural drain in place.  IMPRESSION: - 83 y.o. male POD#0 right crani for acute SDH, neurologically stable with CT demonstrating some re-accumulation. At this point I do not feel he needs re-operation.  PLAN: - Can attempt to wean to extubate per PCCM - Cont Keppra - Will cont JP  - Cont to monitor exam, plan on repeat [facility] tomorrow am.   Augusto Blonder, MD [facility]

## 2023-07-22 NOTE — H&P (Signed)
 Chief Complaint   Chief Complaint  Patient presents with   Code Stroke    LSW 2130    History of Present Illness  Jack White is a 83 y.o. male brought into the emergency department via EMS as a code stroke.  History is obtained from the patient's daughter as he is intubated and sedated upon my arrival.  Briefly, the patient was at his usual state of health a few hours ago.  He apparently fell getting tangled in the legs of his rolling walker around 9:00 this evening.  He was apparently well after this, conversant, moving all extremities per his daughter.  About an hour or 2 later, he was complaining of significant nausea and began vomiting.  He began became unresponsive.  Upon EMS arrival he had significant left-sided arm and leg weakness.  He was brought in as a code stroke where CT scan demonstrated a large right holohemispheric acute subdural hematoma.  Neurosurgical consultation was therefore requested.  Of note, the patient's daughter reports he has a history of diabetes.  No known history of heart attack or previous stroke.  He has a possible diagnosis of Parkinson's disease recently made as an outpatient.  He also has a history of trigeminal neuralgia.  There is no known history of lung, or liver disease.  He does not have any cancer history.  He is not on any blood thinners or antiplatelet agents.  He is a non-smoker.  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 Luise Saint   COLOSTOMY TAKEDOWN  N/A 10/26/2014   Procedure: COLOSTOMY TAKEDOWN;  Surgeon: Rhina Center III, MD;  Location: [facility] ORS;  Service: General;  Laterality: N/A;   EXPLORATORY LAPAROTOMY  05/28/2014   Dr. Luise Saint   LAPAROSCOPIC CHOLECYSTECTOMY  05/21/2014   Dr. Amalia Badder    Social History   Social History   Tobacco Use   Smoking status: Never   Smokeless tobacco: Never  Substance Use Topics   Alcohol use: No    Alcohol/week: 0.0 standard drinks of alcohol   Drug use: No    Medications   Prior to Admission medications   Medication Sig Start Date End Date Taking? Authorizing Provider  aspirin  81 MG tablet Take 81 mg by mouth daily.    [provider]  atorvastatin  (LIPITOR) 10 MG tablet Take 5 mg by mouth at bedtime. 10/03/14   [provider]  carbamazepine  (TEGRETOL  XR) 200 MG 12 hr tablet Take 1 tablet (200 mg total) by mouth 3 (three) times daily. 07/16/23   Tat, Von Grumbling, DO  carbidopa -levodopa  (SINEMET  IR) 25-100 MG tablet Take 1 tablet by mouth 3 (three) times daily. 11/13/22 11/13/23  [provider]  doxazosin  (CARDURA ) 4 MG tablet Take 1 tablet by mouth daily. 10/17/14   [provider]  DULoxetine  (CYMBALTA ) 60 MG capsule Take 60 mg by mouth daily.    [provider]  ergocalciferol (VITAMIN D2) 1.25 MG (50000 UT) capsule Take 50,000 Units by mouth once  a week.    [provider]  fluticasone  (FLONASE ) 50 MCG/ACT nasal spray Place 2 sprays into both nostrils daily. 12/10/22 12/10/23  [provider]  gabapentin  (NEURONTIN ) 300 MG capsule Take 300 mg by mouth 2 (two) times daily. 08/17/14   [provider]  hydrocortisone  cream 1 % Apply topically 3 (three) times daily. 06/26/14   Lauretta Ponto, MD  metFORMIN (GLUCOPHAGE) 1000 MG tablet Take 1,000 mg by mouth 2 (two) times daily with a meal.    [provider]  metoprolol  succinate (TOPROL -XL) 25 MG 24 hr tablet Take 1 tablet by mouth daily. 03/24/23 03/23/24  [provider]   montelukast  (SINGULAIR ) 10 MG tablet Take 10 mg by mouth at bedtime.    [provider]  omeprazole (PRILOSEC) 20 MG capsule Take 1 capsule by mouth daily. 08/17/14   [provider]  ondansetron  (ZOFRAN ) 4 MG tablet Take 1 tablet (4 mg total) by mouth every 6 (six) hours as needed for nausea. 04/08/23   Tiajuana Fluke, MD  tamsulosin  (FLOMAX ) 0.4 MG CAPS capsule Take 0.4 mg by mouth daily after breakfast. 03/05/23 03/04/24  [provider]    Allergies   Allergies  Allergen Reactions   Citrus    Dilaudid [Hydromorphone Hcl] Other (See Comments)    Respiratory arrest   Tomato Other (See Comments)    GI distress, respiratory distress    Review of Systems  ROS  Neurologic Exam  Intubated, sedated Pupils 3mm reactive bilaterally  Per ED MD and neurology, upon arrival patient was answering questions, Moving RUE/RLE but with minimal movement LUE/LLE  Imaging  Noncontrast CT scan of the head was personally reviewed and demonstrates a large right sided frontoparietal convexity acute subdural hematoma measuring approximately 2.1 cm in maximal thickness overlying the frontal convexity.  There is approximately 1.6 cm of right-to-left midline shift.  There is no hydrocephalus.  Impression  - 83 y.o. male approximately 3 hours status post fall with left hemiplegia related to large acute holohemispheric right subdural hematoma.  Plan  - We will plan on proceeding with emergent right craniotomy for evacuation of subdural hematoma - Strict systolic blood pressure control less than 140 mmHg - Admit to neuroICU postop  I have reviewed the situation including the imaging findings with the patient's daughter at bedside.  Given his acute presentation, his baseline state of health, and the radiographic findings, I did recommend emergent craniotomy for evacuation of the subdural hematoma.  I reviewed with her the details of the operation and the expected postoperative  course and recovery.  We did also review the risks of the procedure primarily including risk of reaccumulation possibly requiring additional surgery, risk of seizure, bleeding, CSF leak, and infection.  We also discussed the general risks of anesthesia.  All her questions today were answered.  She provided informed consent to proceed with the surgery on the patient's behalf.  Augusto Blonder, MD [facility]

## 2023-07-22 NOTE — Consult Note (Signed)
 WOC Nurse Consult Note: patient is followed by podiatry for L heel wound, last seen there 07/17/2023 where wound was debrided and wound care orders of Bacitracin and gauze daily were placed  Reason for Consult: L heel wound  Wound type: full thickness ? Etiology (friction vs diabetic ulcer, patient states came up as a blister initially)  Pressure Injury POA: NA not pressure  Measurement: see nursing flowsheet; per podiatry note 35 mm x 20 mm x 1-2 mm depth  Wound bed: largely clean  Drainage (amount, consistency, odor) see nursing flowsheet  Periwound:some dry hyperkeratotic tissue  Dressing procedure/placement/frequency: Cleanse L heel wound with NS, apply Bacitracin ointment to wound bed daily, cover with Telfa nonstick dressing and secure with silicone foam or Kerlix roll gauze whichever is preferred.   POC discussed with bedside nurse. Patient should resume follow-up with podiatry as scheduled outpatient.   WOC team will not follow. Re-consult if further needs arise.   Thank you,    Ronni Colace MSN, RN-BC, [employer] [phone]

## 2023-07-22 NOTE — ED Notes (Signed)
 Neuro surgeon spoke with family on patient's surgery/plan of care .

## 2023-07-22 NOTE — Anesthesia Preprocedure Evaluation (Signed)
 Anesthesia Evaluation  Patient identified by MRN, date of birth, ID band Patient unresponsive    Reviewed: Allergy & Precautions, Patient's Chart, lab work & pertinent test results, Unable to perform ROS - Chart review onlyPreop documentation limited or incomplete due to emergent nature of procedure.  History of Anesthesia Complications Negative for: history of anesthetic complications  Airway Mallampati: Intubated       Dental   Pulmonary    breath sounds clear to auscultation       Cardiovascular hypertension,  Rhythm:Regular     Neuro/Psych  Headaches Acute subdural presenting as code stroke  Parkinson's Trigeminal neuralgia Gate instability  Neuromuscular disease    GI/Hepatic PUD,,,  Endo/Other  diabetes  No results found for: "HGBA1C"   Renal/GU CRFRenal diseaseLab Results      Component                Value               Date                      NA                       137                 07/21/2023                K                        5.2 (H)             07/21/2023                CO2                      23                  07/21/2023                GLUCOSE                  161 (H)             07/21/2023                BUN                      25 (H)              07/21/2023                CREATININE               1.47 (H)            07/21/2023                CALCIUM                   8.2 (L)             07/21/2023                GFRNONAA                 47 (L)              07/21/2023                Musculoskeletal  Abdominal   Peds  Hematology  (+) Blood dyscrasia, anemia Lab Results      Component                Value               Date                      WBC                      7.8                 07/21/2023                HGB                      10.6 (L)            07/21/2023                HCT                      32.9 (L)            07/21/2023                MCV                      97.6                 07/21/2023                PLT                      140 (L)             07/21/2023              Anesthesia Other Findings   Reproductive/Obstetrics                              Anesthesia Physical Anesthesia Plan  ASA: 4 and emergent  Anesthesia Plan: General   Post-op Pain Management:    Induction:   PONV Risk Score and Plan: 2 and Treatment may vary due to age or medical condition  Airway Management Planned: Oral ETT  Additional Equipment:   Intra-op Plan:   Post-operative Plan: Post-operative intubation/ventilation  Informed Consent:      History available from chart only and Only emergency history available  Plan Discussed with: CRNA and Surgeon  Anesthesia Plan Comments:          Anesthesia Quick Evaluation

## 2023-07-22 NOTE — Progress Notes (Signed)
 NAME:  Jack White, MRN:  [id number], DOB:  1940/10/26, LOS: 0 ADMISSION DATE:  07/21/2023, CONSULTATION DATE:  5/27 REFERRING MD:  Dr. Harless Lien, CHIEF COMPLAINT:  SDH   History of Present Illness:  Patient is an 83 yo M w/ pertinent PMH HTN, HLD, DMT2, parkinson disease, trigeminal neuralgia, CKD 3a presents to [facility] on 5/26 w/ SDH.  On 5/26 patient fell around 9 pm. Daughter states he was initially okay just having HA. About 2 hours later having N/V and became altered. EMS came finding patient w/ left arm/leg weakness. Brought to [facility] as code stroke. On arrival normotensive and afebrile. Cbg 144. CT showing large right holohemispheric acute SDH; subfacline herniation and impending uncal herniation. NSG consulted w/ plans to take to OR for emergent craniotomy. Started on cleviprex for SBP <140. Given Keppra 1000. Patient intubated for airway protection. PCCM consulted for vent/medical management.  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 Intubated for airway protection. NSG taking to OR. Pccm consulted  Interim History / Subjective:  Weaning sedation this morning   Objective    Blood pressure (!) 119/95, pulse 69, temperature (!) 97.2 F (36.2 C), temperature source Axillary, resp. rate 18, height 6' (1.829 m), weight 105 kg, SpO2 100%.    Vent Mode: PRVC FiO2 (%):  [60 %] 60 % Set Rate:  [18 bmp] 18 bmp Vt Set:  [620 mL] 620 mL PEEP:  [5 cmH20] 5  cmH20 Plateau Pressure:  [21 cmH20-22 cmH20] 22 cmH20   Intake/Output Summary (Last 24 hours) at 07/22/2023 0930 Last data filed at 07/22/2023 0800 Gross per 24 hour  Intake 2561.89 ml  Output 1170 ml  Net 1391.89 ml   Filed Weights   07/21/23 2300  Weight: 105 kg    Examination: General: critically ill elderly M intubated lightly sedated NAD  Neuro: lightly sedated. Following inconsistent commands. Initiates breaths on vent. Constant BUE tremor.   HEENT: JP w bloody output. ETT secure. Anicteric sclera  CV: rr s1s2 cap refill brisk  PULM:  mechanically ventilated. Small Vt on PSV   GI: soft ndnt  Extremities: no acute joint deformity     Resolved problem list   Assessment and Plan   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  Plan: -post op, imaging per NSGY  -on keppra for ppx  -SBP goal < 140  -- on clevi at present  -neuro protective measures -wean sedation for goal RASS 0/-1  -holding chem vte ppx, holding home ASA   Acute respiratory failure Plan: -WUA/SBT -- wean as able  -VAP pulm hygiene   DM2  Plan: -SSI   CKD 3a Hyperkalemia  Plan: -repeat BMP this afternoon after rcving lokelma   Possible parkinsons dz Trigeminal neuralgia Peripheral neuropathy  Plan: -cymbalta  and tegretol  on hold (XR, can't go per tube) -start home gabapentin  (300 BID)  -cinemet   HTN HLD Plan: -home statin -holding home metop, on clevi for now   BPH Plan: -  cont home flowmax, cardura    Left heal ulcer poa -seen by podiatry at [facility] on 5/22 s/p debridement Plan: -WOC consulted   Best Practice (right click and "Reselect all SmartList Selections" daily)   Diet/type: NPO w/ meds via tube DVT prophylaxis SCD Pressure ulcer(s): present on admission  GI prophylaxis: H2B Lines: N/A Foley:  N/A Code Status:  full code Last date of multidisciplinary goals of care discussion [5/27 spoke w/ daughter at bedside. States to do everything for now but patient would not  want to be on vent long term.]  Labs   CBC: Recent Labs  Lab 07/21/23 2309 07/21/23 2311 07/22/23 0459  WBC  --  7.8 5.0  NEUTROABS  --  5.6  --   HGB 10.9* 10.6* 8.7*  HCT 32.0* 32.9* 27.2*  MCV  --  97.6 96.8  PLT  --  140* 141*    Basic Metabolic Panel: Recent Labs  Lab 07/21/23 2309 07/21/23 2311 07/22/23 0459  NA 139 137 136  K 5.2* 5.2* 5.4*  CL 107 106 110  CO2  --  23 22  GLUCOSE 159* 161* 244*  BUN 26* 25* 24*  CREATININE 1.50* 1.47* 1.32*  CALCIUM   --  8.2* 7.4*  MG  --   --  1.8   GFR: Estimated Creatinine Clearance: 52.2 mL/min (A) (by C-G formula based on SCr of 1.32 mg/dL (H)). Recent Labs  Lab 07/21/23 2311 07/22/23 0459  WBC 7.8 5.0    Liver Function Tests: Recent Labs  Lab 07/21/23 2311  AST 25  ALT 6  ALKPHOS 117  BILITOT 0.7  PROT 7.0  ALBUMIN 3.0*   No results for input(s): "LIPASE", "AMYLASE" in the last 168 hours. No results for input(s): "AMMONIA" in the last 168 hours.  ABG    Component Value Date/Time   TCO2 24 07/21/2023 2309     Coagulation Profile: Recent Labs  Lab 07/21/23 2327  INR 1.2    Cardiac Enzymes: No results for input(s): "CKTOTAL", "CKMB", "CKMBINDEX", "TROPONINI" in the last 168 hours.  HbA1C: Hgb A1c MFr Bld  Date/Time Value Ref Range Status  07/22/2023 04:59 AM 6.5 (H) 4.8 - 5.6 % Final    Comment:    (NOTE) Pre diabetes:          5.7%-6.4%  Diabetes:              >6.4%  Glycemic control for   <7.0% adults with diabetes     CBG: Recent Labs  Lab 07/21/23 2305 07/22/23 0903  GLUCAP 144* 235*     CRITICAL CARE Performed by: Delories Fetter   Total critical care time: 41 minutes  Critical care time was exclusive of separately billable procedures and treating other patients.  Critical care was necessary to treat or prevent imminent or life-threatening deterioration.  Critical care was time spent personally by me on the following activities: development of treatment plan with  patient and/or surrogate as well as nursing, discussions with consultants, evaluation of patient's response to treatment, examination of patient, obtaining history from patient or surrogate, ordering and performing treatments and interventions, ordering and review of laboratory studies, ordering and review of radiographic studies, pulse oximetry and re-evaluation of patient's condition.  Eston Hence MSN, AGACNP-BC [place] [facility] Amion for pager 07/22/2023, 9:30 AM

## 2023-07-22 NOTE — Progress Notes (Signed)
 Pt transported from 4N19 to CT and back without complications.

## 2023-07-22 NOTE — Progress Notes (Addendum)
 Patient tolerated small amounts of apple juice with crushed medication. Did not tolerate more than 2 oz of juice, led to emesis.   PO phenergan  and PO norco were given after brief amount of emesis. Given in 1.5 oz of apple juice, crushed. Patient tolerated PO intake.  Patient resting comfortably and expressed decrease nausea and pain. Neuro assessment remains the same.   Son in Social worker at bedside.

## 2023-07-22 NOTE — Progress Notes (Signed)
 E.T tube pulled back from 25cm to 23cm at the lip per Dr.Bero.

## 2023-07-22 NOTE — Progress Notes (Addendum)
 eLink Physician-Brief Progress Note Patient Name: Jack White DOB: 14-Oct-1940 MRN: [id number]   Date of Service  07/22/2023  HPI/Events of Note  eICU Brief new admit note: 82 yo M w/ pertinent PMH HTN, HLD, DMT2, parkinson disease, trigeminal neuralgia, CKD 3a presents to [facility] on 5/26 w/ SDH .  Right frontoparietal craniotomy for evacuation of subdural hematoma, now on vent from OT.     Data: Reviewed Hg 10, K 5.2, cr 1.47 CxR: ET tube abutting right main stem bronchus. No chf or pneumonia.   Camera: VS stable. On lung protective ventilation. On fenta/propofol /saline- need orders for this per bed side RN.   eICU Interventions  ICU sedation and fluid ordered. On lung protective ventilation VAP bundle. SCD. Follow labs in AM. K was at 5.2 at 23:11.BMP, cbc  mag ordered for AM.  Withdraw ET tube up by 2 cm and re check CxR.( Notified RN).       Intervention Category Major Interventions: Other:;Respiratory failure - evaluation and management (SDH) Evaluation Type: New Patient Evaluation  Rexann Catalan 07/22/2023, 2:18 AM  06:25 Bladder scan = 472 and bedside RN asking if you wanted him to I/O Cath patient or place foley? No prior I/O Cath as of yet - I/O cath ordered once, bladder scan q4 hr prn .

## 2023-07-22 NOTE — Progress Notes (Signed)
 Patient extubated and following commands; able to hear patient express his name as well as daughters name.

## 2023-07-22 NOTE — ED Notes (Signed)
 Transported to OR

## 2023-07-22 NOTE — ED Notes (Signed)
Patient intubated by EDP .

## 2023-07-23 ENCOUNTER — Inpatient Hospital Stay (HOSPITAL_COMMUNITY)

## 2023-07-23 ENCOUNTER — Encounter (HOSPITAL_COMMUNITY): Payer: Self-pay | Admitting: Neurosurgery

## 2023-07-23 DIAGNOSIS — I10 Essential (primary) hypertension: Secondary | ICD-10-CM

## 2023-07-23 DIAGNOSIS — N1831 Chronic kidney disease, stage 3a: Secondary | ICD-10-CM | POA: Diagnosis not present

## 2023-07-23 DIAGNOSIS — E119 Type 2 diabetes mellitus without complications: Secondary | ICD-10-CM | POA: Diagnosis not present

## 2023-07-23 DIAGNOSIS — E440 Moderate protein-calorie malnutrition: Secondary | ICD-10-CM | POA: Insufficient documentation

## 2023-07-23 DIAGNOSIS — S065XAA Traumatic subdural hemorrhage with loss of consciousness status unknown, initial encounter: Secondary | ICD-10-CM | POA: Diagnosis not present

## 2023-07-23 DIAGNOSIS — N400 Enlarged prostate without lower urinary tract symptoms: Secondary | ICD-10-CM

## 2023-07-23 DIAGNOSIS — E785 Hyperlipidemia, unspecified: Secondary | ICD-10-CM

## 2023-07-23 LAB — BASIC METABOLIC PANEL WITH GFR
Anion gap: 10 (ref 5–15)
Anion gap: 7 (ref 5–15)
BUN: 24 mg/dL — ABNORMAL HIGH (ref 8–23)
BUN: 24 mg/dL — ABNORMAL HIGH (ref 8–23)
CO2: 21 mmol/L — ABNORMAL LOW (ref 22–32)
CO2: 24 mmol/L (ref 22–32)
Calcium: 7.4 mg/dL — ABNORMAL LOW (ref 8.9–10.3)
Calcium: 8 mg/dL — ABNORMAL LOW (ref 8.9–10.3)
Chloride: 109 mmol/L (ref 98–111)
Chloride: 109 mmol/L (ref 98–111)
Creatinine, Ser: 1.53 mg/dL — ABNORMAL HIGH (ref 0.61–1.24)
Creatinine, Ser: 1.52 mg/dL — ABNORMAL HIGH (ref 0.61–1.24)
GFR, Estimated: 45 mL/min — ABNORMAL LOW (ref >60)
GFR, Estimated: 45 mL/min — ABNORMAL LOW (ref >60)
Glucose, Bld: 172 mg/dL — ABNORMAL HIGH (ref 70–99)
Glucose, Bld: 158 mg/dL — ABNORMAL HIGH (ref 70–99)
Potassium: 4.9 mmol/L (ref 3.5–5.1)
Potassium: 4.9 mmol/L (ref 3.5–5.1)
Sodium: 140 mmol/L (ref 135–145)
Sodium: 140 mmol/L (ref 135–145)

## 2023-07-23 LAB — GLUCOSE, CAPILLARY
Glucose-Capillary: 161 mg/dL — ABNORMAL HIGH (ref 70–99)
Glucose-Capillary: 177 mg/dL — ABNORMAL HIGH (ref 70–99)
Glucose-Capillary: 150 mg/dL — ABNORMAL HIGH (ref 70–99)
Glucose-Capillary: 145 mg/dL — ABNORMAL HIGH (ref 70–99)
Glucose-Capillary: 140 mg/dL — ABNORMAL HIGH (ref 70–99)
Glucose-Capillary: 165 mg/dL — ABNORMAL HIGH (ref 70–99)

## 2023-07-23 LAB — MAGNESIUM: Magnesium: 1.8 mg/dL (ref 1.7–2.4)

## 2023-07-23 MED ORDER — HYDROCODONE-ACETAMINOPHEN 5-325 MG PO TABS
1.0000 | ORAL_TABLET | ORAL | Status: DC | PRN
  Administered 2023-07-24 – 2023-07-25 (×2): 1
  Filled 2023-07-23 (×2): qty 1

## 2023-07-23 MED ORDER — CARBIDOPA-LEVODOPA 25-100 MG PO TABS
1.0000 | ORAL_TABLET | Freq: Three times a day (TID) | ORAL | Status: DC
  Administered 2023-07-23 – 2023-08-10 (×53): 1
  Filled 2023-07-23 (×57): qty 1

## 2023-07-23 MED ORDER — PROSOURCE TF20 ENFIT COMPATIBL EN LIQD
60.0000 mL | Freq: Two times a day (BID) | ENTERAL | Status: DC
  Administered 2023-07-23 – 2023-08-06 (×28): 60 mL
  Filled 2023-07-23 (×28): qty 60

## 2023-07-23 MED ORDER — FAMOTIDINE 20 MG PO TABS
20.0000 mg | ORAL_TABLET | Freq: Two times a day (BID) | ORAL | Status: DC
  Administered 2023-07-23 – 2023-07-24 (×2): 20 mg
  Filled 2023-07-23 (×2): qty 1

## 2023-07-23 MED ORDER — GABAPENTIN 250 MG/5ML PO SOLN
300.0000 mg | Freq: Two times a day (BID) | ORAL | Status: DC
  Administered 2023-07-23 – 2023-07-25 (×4): 300 mg
  Filled 2023-07-23 (×5): qty 6

## 2023-07-23 MED ORDER — ONDANSETRON HCL 4 MG PO TABS
4.0000 mg | ORAL_TABLET | ORAL | Status: DC | PRN
  Administered 2023-08-06: 4 mg
  Filled 2023-07-23 (×2): qty 1

## 2023-07-23 MED ORDER — MELATONIN 5 MG PO TABS: 5.0000 mg | ORAL_TABLET | Freq: Every day | ORAL | Status: DC

## 2023-07-23 MED ORDER — PROMETHAZINE HCL 25 MG PO TABS: 12.5000 mg | ORAL_TABLET | ORAL | Status: DC | PRN

## 2023-07-23 MED ORDER — MELATONIN 5 MG PO TABS
5.0000 mg | ORAL_TABLET | Freq: Every day | ORAL | Status: DC
  Administered 2023-07-23 – 2023-07-24 (×2): 5 mg
  Filled 2023-07-23 (×2): qty 1

## 2023-07-23 MED ORDER — DOCUSATE SODIUM 50 MG/5ML PO LIQD
100.0000 mg | Freq: Two times a day (BID) | ORAL | Status: DC
  Administered 2023-07-23 – 2023-08-10 (×27): 100 mg
  Filled 2023-07-23 (×30): qty 10

## 2023-07-23 MED ORDER — POLYETHYLENE GLYCOL 3350 17 G PO PACK
17.0000 g | PACK | Freq: Every day | ORAL | Status: DC
  Administered 2023-07-24 – 2023-08-08 (×12): 17 g
  Filled 2023-07-23 (×15): qty 1

## 2023-07-23 MED ORDER — OSMOLITE 1.5 CAL PO LIQD
1000.0000 mL | ORAL | Status: DC
  Administered 2023-07-23 – 2023-08-05 (×9): 1000 mL

## 2023-07-23 MED ORDER — ONDANSETRON HCL 4 MG/2ML IJ SOLN
4.0000 mg | INTRAMUSCULAR | Status: DC | PRN
  Administered 2023-07-23 – 2023-08-09 (×4): 4 mg via INTRAVENOUS
  Filled 2023-07-23 (×4): qty 2

## 2023-07-23 MED ORDER — ACETAMINOPHEN 325 MG PO TABS
650.0000 mg | ORAL_TABLET | ORAL | Status: DC | PRN
  Administered 2023-07-24 – 2023-07-25 (×2): 650 mg
  Filled 2023-07-23 (×2): qty 2

## 2023-07-23 MED ORDER — MONTELUKAST SODIUM 10 MG PO TABS
10.0000 mg | ORAL_TABLET | Freq: Every day | ORAL | Status: DC
  Administered 2023-07-23 – 2023-08-13 (×21): 10 mg
  Filled 2023-07-23 (×22): qty 1

## 2023-07-23 MED ORDER — ACETAMINOPHEN 650 MG RE SUPP: 650.0000 mg | RECTAL | Status: DC | PRN

## 2023-07-23 NOTE — Evaluation (Signed)
 Physical Therapy Evaluation Patient Details Name: Jack White MRN: [id number] DOB: [birth year] Today's Date: 07/23/2023  History of Present Illness  Patient is an 83 y.o. male who presented to the ED via EMS 07/22/2023 as a code stroke after becoming unresponsive at home. He was intubated upon arrival. CT head showed large right holohemispheric acute SDH. Neurosurgery was consulted and patient underwent a  right craniotomy on 07/22/23. 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  Clinical Impression  PTA, pt lives alone, is modI with ADL's and ambulation using RW, requires intermittent assist for IADL's. Pt daughter reports pt has high level of motivation with therapies in the past. Pt presents with decreased functional mobility secondary to pain, impaired cognition, sitting/standing balance, weakness. Pt A&O to self only with decreased command following. Pt requiring two person mod-max assist for functional mobility. Stood from edge of bed to RW and taking lateral steps with multimodal cueing and manual assist for LLE progression. Pt daughter reports working on plan for 24/7 assist at d/c, with potential to d/c home with her. Patient will benefit from intensive inpatient follow-up therapy, >3 hours/day in order to address deficits, maximize functional mobility and decrease caregiver burden.        If plan is discharge home, recommend the following: Two people to help with walking and/or transfers;Two people to help with bathing/dressing/bathroom   Can travel by private vehicle        Equipment Recommendations Other (comment) (defer)  Recommendations for Other Services  Rehab consult    Functional Status Assessment Patient has had a recent decline in their functional status and demonstrates the ability to make significant improvements in function in a reasonable and predictable amount of time.     Precautions / Restrictions Precautions Precautions:  Fall;Other (comment) Recall of Precautions/Restrictions: Impaired Precaution/Restrictions Comments: JP drain Restrictions Weight Bearing Restrictions Per Provider Order: No      Mobility  Bed Mobility Overal bed mobility: Needs Assistance Bed Mobility: Supine to Sit, Sit to Supine     Supine to sit: Max assist, +2 for physical assistance Sit to supine: Max assist, +2 for physical assistance   General bed mobility comments: Decreased initiation by pt, maxA + 2 for supine <> sit    Transfers Overall transfer level: Needs assistance Equipment used: Rolling walker (2 wheels) Transfers: Sit to/from Stand Sit to Stand: Mod assist, +2 physical assistance           General transfer comment: ModA  +2 to rise from edge of bed with hand over hand guidance to locate handles of walker, anterior foot block to prevent slide. Pt able to laterally step with R foot with max multimodal cueing, manual assist for shifting left foot over    Ambulation/Gait               General Gait Details: unable  Stairs            Wheelchair Mobility     Tilt Bed    Modified Rankin (Stroke Patients Only) Modified Rankin (Stroke Patients Only) Pre-Morbid Rankin Score: Moderate disability Modified Rankin: Severe disability     Balance Overall balance assessment: Needs assistance Sitting-balance support: Feet supported Sitting balance-Leahy Scale: Poor Sitting balance - Comments: Statically sitting with CGA, regresses to minA with fatigue   Standing balance support: Bilateral upper extremity supported Standing balance-Leahy Scale: Poor Standing balance comment: RW and external support of therapists  Pertinent Vitals/Pain Pain Assessment Pain Assessment: Faces Faces Pain Scale: Hurts little more Pain Location: head Pain Descriptors / Indicators: Aching, Discomfort Pain Intervention(s): Limited activity within patient's tolerance, Monitored  during session, Premedicated before session    Home Living Family/patient expects to be discharged to:: Private residence Living Arrangements: Alone Available Help at Discharge: Family;Available PRN/intermittently Type of Home: Mobile home Home Access: Ramped entrance       Home Layout: One level Home Equipment: Agricultural consultant (2 wheels);Shower seat;Wheelchair - manual      Prior Function Prior Level of Function : Independent/Modified Independent             Mobility Comments: Mod indep with RW for ADLs, recently discharged from OPPT, was about to start Parkinson's exercise group ADLs Comments: Independent ADL's, can cut up vegetable sitting at table, but otherwise does not cook.     Extremity/Trunk Assessment   Upper Extremity Assessment Upper Extremity Assessment: Defer to OT evaluation    Lower Extremity Assessment Lower Extremity Assessment: Generalized weakness    Cervical / Trunk Assessment Cervical / Trunk Assessment: Other exceptions (forward head posture)  Communication   Communication Communication: Impaired Factors Affecting Communication: Hearing impaired    Cognition Arousal: Lethargic Behavior During Therapy: Flat affect   PT - Cognitive impairments: Orientation, Awareness, Memory, Attention, Initiation, Sequencing, Problem solving, Safety/Judgement   Orientation impairments: Place, Time, Situation                   PT - Cognition Comments: Pt A&O to self, able to state birthday, but perseverative on repeating date of birthday and "scratch," referring to head or back. Following commands: Impaired Following commands impaired: Follows one step commands inconsistently     Cueing Cueing Techniques: Verbal cues     General Comments      Exercises     Assessment/Plan    PT Assessment Patient needs continued PT services  PT Problem List Decreased strength;Decreased activity tolerance;Decreased balance;Decreased mobility;Decreased  cognition;Decreased safety awareness;Pain       PT Treatment Interventions DME instruction;Gait training;Functional mobility training;Therapeutic activities;Therapeutic exercise;Balance training;Patient/family education    PT Goals (Current goals can be found in the Care Plan section)  Acute Rehab PT Goals Patient Stated Goal: pt daughter agreeable to rehab PT Goal Formulation: With patient/family Time For Goal Achievement: 08/06/23 Potential to Achieve Goals: Fair    Frequency Min 3X/week     Co-evaluation PT/OT/SLP Co-Evaluation/Treatment: Yes Reason for Co-Treatment: Complexity of the patient's impairments (multi-system involvement);Necessary to address cognition/behavior during functional activity;For patient/therapist safety;To address functional/ADL transfers PT goals addressed during session: Mobility/safety with mobility         AM-PAC PT "6 Clicks" Mobility  Outcome Measure Help needed turning from your back to your side while in a flat bed without using bedrails?: A Lot Help needed moving from lying on your back to sitting on the side of a flat bed without using bedrails?: Total Help needed moving to and from a bed to a chair (including a wheelchair)?: Total Help needed standing up from a chair using your arms (e.g., wheelchair or bedside chair)?: Total Help needed to walk in hospital room?: Total Help needed climbing 3-5 steps with a railing? : Total 6 Click Score: 7    End of Session Equipment Utilized During Treatment: Gait belt Activity Tolerance: Patient tolerated treatment well Patient left: in bed;with call bell/phone within reach;with bed alarm set;with family/visitor present Nurse Communication: Mobility status PT Visit Diagnosis: Unsteadiness on feet (R26.81);History of falling (Z91.81);Difficulty  in walking, not elsewhere classified (R26.2);Pain Pain - part of body:  (head)    Time: 6213-0865 PT Time Calculation (min) (ACUTE ONLY): 40  min   Charges:   PT Evaluation $PT Eval Moderate Complexity: 1 Mod PT Treatments $Therapeutic Activity: 8-22 mins PT General Charges $$ ACUTE PT VISIT: 1 Visit         Verdia Glad, PT, DPT Acute Rehabilitation Services Office [phone]   Claria Crofts 07/23/2023, 12:25 PM

## 2023-07-23 NOTE — Evaluation (Signed)
 Occupational Therapy Evaluation Patient Details Name: MARKO SKALSKI MRN: [id number] DOB: 1940/04/28 Today's Date: 07/23/2023   History of Present Illness   Patient is an 83 y.o. male who presented to the ED via EMS 07/22/2023 as a code stroke after becoming unresponsive at home. He was intubated upon arrival. CT head showed large right holohemispheric acute SDH. Neurosurgery was consulted and patient underwent a  right craniotomy on 07/22/23. 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     Clinical Impressions Pt admitted with the above diagnosis. Pt currently with functional limitations due to the deficits listed below (see OT Problem List). Prior to admit, pt was living alone and able to complete ADL tasks at Mod I level. Has a strong family support system in place. Pt will benefit from acute skilled OT to increase their safety and independence with ADL and functional mobility for ADL to facilitate discharge. Patient will benefit from intensive inpatient follow-up therapy, >3 hours/day. OT will continue to follow patient acutely.        If plan is discharge home, recommend the following:   Two people to help with walking and/or transfers;Two people to help with bathing/dressing/bathroom;Other (comment) (24/7 assistance)     Functional Status Assessment   Patient has had a recent decline in their functional status and demonstrates the ability to make significant improvements in function in a reasonable and predictable amount of time.     Equipment Recommendations   Wheelchair (measurements OT);Wheelchair cushion (measurements OT);BSC/3in1      Precautions/Restrictions   Precautions Precautions: Fall;Other (comment) Recall of Precautions/Restrictions: Impaired Precaution/Restrictions Comments: JP drain, parkinsons Restrictions Weight Bearing Restrictions Per Provider Order: No     Mobility Bed Mobility Overal bed mobility: Needs  Assistance Bed Mobility: Supine to Sit, Sit to Supine     Supine to sit: +2 for physical assistance, Total assist, HOB elevated Sit to supine: +2 for physical assistance, Total assist   General bed mobility comments: VC to initiate and for sequencing.    Transfers Overall transfer level: Needs assistance Equipment used: Rolling walker (2 wheels) Transfers: Sit to/from Stand Sit to Stand: Max assist, +2 physical assistance    General transfer comment: Increased assistance to rise from edge of bed with hand over hand guidance to locate handles of walker, anterior foot block to prevent slide. Pt able to laterally step with R foot with max multimodal cueing, manual assist for shifting left foot over      Balance Overall balance assessment: Needs assistance, History of Falls Sitting-balance support: Feet supported Sitting balance-Leahy Scale: Poor Sitting balance - Comments: Statically sitting with CGA, regresses to minA with fatigue. Slight left lateral lean when fatigued   Standing balance support: Bilateral upper extremity supported, Reliant on assistive device for balance Standing balance-Leahy Scale: Zero Standing balance comment: RW and external support of therapists. Pt with posterior bias and increased difficulty achieving an upright posture.      ADL either performed or assessed with clinical judgement   ADL       General ADL Comments: Requires total A either at bed level or sitting EOB to complete BADL tasks. Hand over hand assist provided for grooming and self feeding task during evaluation     Vision Baseline Vision/History: 0 No visual deficits Ability to See in Adequate Light: 0 Adequate Additional Comments: unable to assess vision fully d/t cognition     Perception Perception: Not tested       Praxis Praxis: Not tested  Pertinent Vitals/Pain Pain Assessment Pain Assessment: Faces Faces Pain Scale: Hurts little more Pain Location: head Pain  Descriptors / Indicators: Aching, Discomfort, Grimacing, Restless Pain Intervention(s): Limited activity within patient's tolerance, Monitored during session, Premedicated before session, Other (comment) (distraction)     Extremity/Trunk Assessment Upper Extremity Assessment Upper Extremity Assessment: Right hand dominant;Generalized weakness (unable to follow assess d/t cogntition)   Lower Extremity Assessment Lower Extremity Assessment: Defer to PT evaluation   Cervical / Trunk Assessment Cervical / Trunk Assessment: Other exceptions Cervical / Trunk Exceptions: forward head, rounded shoulders, posterior pelvic tilt   Communication Communication Communication: Impaired Factors Affecting Communication: Hearing impaired;Other (comment);Difficulty expressing self;Reduced clarity of speech (dentures not in mouth)   Cognition Arousal: Lethargic Behavior During Therapy: Flat affect Cognition: Cognition impaired   Orientation impairments: Place, Time, Situation Awareness: Intellectual awareness impaired, Online awareness impaired Memory impairment (select all impairments): Short-term memory, Working [employer] fast streamer, Non-declarative long-term memory, Geneticist, molecular long-term memory Attention impairment (select first level of impairment): Sustained attention Executive functioning impairment (select all impairments): Initiation, Organization, Sequencing, Reasoning, Problem solving OT - Cognition Comments: Dentures were not in mouth making expressive communication very difficult. Pt did not follow 1 step commands consistently although did demonstrate movement request volitionally    Following commands: Impaired Following commands impaired: Follows one step commands inconsistently     Cueing  General Comments   Cueing Techniques: Verbal cues;Tactile cues  90 HR, SpO2 95% on RA, RR 18, BP: 149/64 (91)           Home Living Family/patient expects to be discharged to:: Private residence Living  Arrangements: Alone Available Help at Discharge: Family;Available PRN/intermittently Type of Home: Mobile home Home Access: Ramped entrance     Home Layout: One level     Bathroom Shower/Tub: Walk-in shower         Home Equipment: Agricultural consultant (2 wheels);Shower seat;Wheelchair - manual          Prior Functioning/Environment Prior Level of Function : Independent/Modified Independent    Mobility Comments: Mod indep with RW for ADLs, recently discharged from OPPT, was about to start Parkinson's [facility] exercise group ADLs Comments: Independent ADL's, can cut up vegetable sitting at table, but otherwise does not cook.    OT Problem List: Decreased strength;Decreased coordination;Pain;Decreased range of motion;Decreased cognition;Decreased safety awareness;Decreased activity tolerance;Impaired balance (sitting and/or standing);Decreased knowledge of use of DME or AE;Impaired vision/perception   OT Treatment/Interventions: Self-care/ADL training;Therapeutic exercise;Therapeutic activities;Neuromuscular education;Cognitive remediation/compensation;Visual/perceptual remediation/compensation;Energy conservation;DME and/or AE instruction;Patient/family education;Balance training;Manual therapy      OT Goals(Current goals can be found in the care plan section)   Acute Rehab OT Goals Patient Stated Goal: None stated OT Goal Formulation: Patient unable to participate in goal setting Time For Goal Achievement: 08/06/23 Potential to Achieve Goals: Fair   OT Frequency:  Min 3X/week    Co-evaluation PT/OT/SLP Co-Evaluation/Treatment: Yes Reason for Co-Treatment: Complexity of the patient's impairments (multi-system involvement);Necessary to address cognition/behavior during functional activity;For patient/therapist safety;To address functional/ADL transfers   OT goals addressed during session: ADL's and self-care;Strengthening/ROM      AM-PAC OT "6 Clicks" Daily Activity     Outcome  Measure Help from another person eating meals?: Total Help from another person taking care of personal grooming?: Total Help from another person toileting, which includes using toliet, bedpan, or urinal?: Total Help from another person bathing (including washing, rinsing, drying)?: Total Help from another person to put on and taking off regular upper body clothing?: Total Help from another person to put on and  taking off regular lower body clothing?: Total 6 Click Score: 6   End of Session Equipment Utilized During Treatment: Gait belt;Rolling walker (2 wheels) Nurse Communication: Mobility status  Activity Tolerance: Patient limited by fatigue Patient left: in bed;with call bell/phone within reach;with bed alarm set;with family/visitor present  OT Visit Diagnosis: Unsteadiness on feet (R26.81);Muscle weakness (generalized) (M62.81);History of falling (Z91.81);Other symptoms and signs involving cognitive function                Time: 1017-1056 OT Time Calculation (min): 39 min Charges:  OT General Charges $OT Visit: 1 Visit OT Evaluation $OT Eval High Complexity: 1 High OT Treatments $Self Care/Home Management : 8-22 mins  Carollee Circle, OTR/L,CBIS  Supplemental OT - [facility] and [facility] Secure Chat Preferred    Neil Brickell, [name] 07/23/2023, 2:19 PM

## 2023-07-23 NOTE — Progress Notes (Signed)
 Initial Nutrition Assessment  DOCUMENTATION CODES:   Non-severe (moderate) malnutrition in context of chronic illness  INTERVENTION:   Initiate tube feeding via Cortrak tube: Osmolite 1.5 at 20 ml/h and increase by 10 ml every 6 hours to goal rate of 50 ml/hr (1200 ml per day)  Prosource TF20 60 ml BID  Provides 1960 kcal, 115 gm protein, 912 ml free water daily  MVI with minerals daily  100 mg thiamine daily   Monitor magnesium  and phosphorus every 12 hours x 4 occurrences, MD to replete as needed, as pt is at risk for refeeding syndrome given pt meets criteria for moderate malnutrition.  NUTRITION DIAGNOSIS:   Moderate Malnutrition related to chronic illness as evidenced by moderate fat depletion, moderate muscle depletion.  GOAL:   Patient will meet greater than or equal to 90% of their needs  MONITOR:   TF tolerance  REASON FOR ASSESSMENT:   Consult Enteral/tube feeding initiation and management  ASSESSMENT:   Pt with PMH of HTN, HLD, DM, Parkinson's dz, trigeminal neuralgia, CKD 3, and L heel ulcer seen by podiatry at [facility] s/p debridement 5/22 now admitted 5/26 after a fall with large R acute SDH.   No family present, pt unable to answer any questions. Spoke with RN and CCM NP. Start TF today after cortrak placement.   5/27 - s/p R crani for evacuation of SDH 5/28 - s/p cortrak placement; tip gastric   Medications reviewed and include: colace, pepcid, SSI TID with meals, keppra, miralax   Labs reviewed:  A1C 6.5 CBG's: 108-218    NUTRITION - FOCUSED PHYSICAL EXAM:  Flowsheet Row Most Recent Value  Orbital Region Mild depletion  Upper Arm Region No depletion  Thoracic and Lumbar Region No depletion  Buccal Region Mild depletion  Temple Region Unable to assess  Clavicle Bone Region Moderate depletion  Clavicle and Acromion Bone Region Severe depletion  Scapular Bone Region Severe depletion  Dorsal Hand Unable to assess  Patellar Region No depletion   Anterior Thigh Region No depletion  Posterior Calf Region No depletion  Edema (RD Assessment) Mild  Hair Reviewed  Eyes Reviewed  Mouth Unable to assess  Skin Reviewed  Nails Unable to assess       Diet Order:   Diet Order             Diet NPO time specified Except for: Ice Chips, Sips with Meds, Other (See Comments)  Diet effective now                   EDUCATION NEEDS:   Not appropriate for education at this time  Skin:  Skin Assessment: Skin Integrity Issues: Skin Integrity Issues:: Other (Comment) Other: L heel wound followed by [facility]  Last BM:  unknown  Height:   Ht Readings from Last 1 Encounters:  07/21/23 6' (1.829 m)    Weight:   Wt Readings from Last 1 Encounters:  07/21/23 105 kg    BMI:  Body mass index is 31.39 kg/m.  Estimated Nutritional Needs:   Kcal:  1900-2100  Protein:  110-125 grams  Fluid:  >1.9 L/day  Randine Butcher., RD, LDN, CNSC See AMiON for contact information

## 2023-07-23 NOTE — Procedures (Signed)
 Cortrak  Person Inserting Tube:  Brenita Callow, Cailyn Houdek L, RD Tube Type:  Cortrak - 43 inches Tube Size:  10 Tube Location:  Left nare Initial Placement:  Stomach Secured by: Bridle Technique Used to Measure Tube Placement:  Marking at nare/corner of mouth Cortrak Secured At:  65 cm   Cortrak Tube Team Note:  Consult received to place a Cortrak feeding tube.   No x-ray is required. RN may begin using tube.   If the tube becomes dislodged please keep the tube and contact the Cortrak team at [email or web address] for replacement.  If after hours and replacement cannot be delayed, place a NG tube and confirm placement with an abdominal x-ray.    Doneta Furbish RD, LDN Clinical Dietitian

## 2023-07-23 NOTE — Progress Notes (Signed)
 SLP Cancellation Note  Patient Details Name: Jack White MRN: [id number] DOB: 09-28-1940   Cancelled treatment:       Reason Eval/Treat Not Completed: Patient's level of consciousness SLP will continue to follow for PO readiness.  Jacqualine Mater, MA, CCC-SLP Speech Therapy

## 2023-07-23 NOTE — TOC Initial Note (Signed)
 Transition of Care [facility]) - Initial/Assessment Note    Patient Details  Name: Jack White MRN: [id number] Date of Birth: Nov 13, 1940  [facility] of [facility]) CM/SW Contact:    Omie Bickers, RN Phone Number: 07/23/2023, 10:40 AM  Clinical Narrative:                  Met patient at bedside, he was unable to participate in assessment.  Patient from home alone, found down, presents with AMS and left hemiplegia after a fall .  He suffered Acute traumatic right subdural hematoma with mass effect and right to left midline shift  Per notes, patient improving since surgery. TOC will follow for transition of care needs as treatment plan is identified and patient is able to participate in therapy evals. PT OT SLP pending   Expected Discharge Plan:  (TBD) Barriers to Discharge: Continued Medical Work up   Patient Goals and CMS Choice Patient states their goals for this hospitalization and ongoing recovery are:: patient unable to participate in assessment          Expected Discharge Plan and Services   Discharge Planning Services: CM Consult   Living arrangements for the past 2 months: Mobile Home                                      Prior Living Arrangements/Services Living arrangements for the past 2 months: Mobile Home Lives with:: Self                   Activities of Daily Living      Permission Sought/Granted                  Emotional Assessment              Admission diagnosis:  Subdural hematoma (HCC) [S06.5XAA] Status post surgery [Z98.890] Patient Active Problem List   Diagnosis Date Noted   Status post surgery 07/22/2023   Subdural hematoma (HCC) 07/22/2023   Acute gastroenteritis 04/07/2023   Intractable nausea and vomiting 04/07/2023   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   S/P colostomy (HCC)    Acute cholecystitis 10/14/2014   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   Colon perforation  (HCC) 10/14/2014   H/O colostomy 10/14/2014   Diabetes mellitus, type 2 (HCC) 10/14/2014   BP (high blood pressure) 10/14/2014   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   [name]'s neuralgia 10/14/2014   Gangrenous cholecystitis 06/27/2014   Chronic kidney disease (CKD), stage III (moderate) (HCC) 02/15/2014   Fungal infection of toenail 07/30/2013   PCP:  Nestor Banter, MD Pharmacy:   [facility] [address], [place] - [address] [place] [address] Phone: [phone] Fax: [phone]     Social Drivers of Health (SDOH) Social History: SDOH Screenings   Food Insecurity: No Food Insecurity (07/22/2023)  Housing: Low Risk  (07/22/2023)  Transportation Needs: No Transportation Needs (07/22/2023)  Utilities: Not At Risk (07/22/2023)  Financial Resource Strain: Patient Declined (02/07/2023)   Received from [facility]  Social Connections: Moderately Integrated (07/22/2023)  Tobacco Use: Low Risk  (07/22/2023)  Recent Concern: Tobacco Use - Medium Risk (07/17/2023)   Received [facility]   SDOH Interventions:     Readmission Risk Interventions     No data to display

## 2023-07-23 NOTE — Progress Notes (Signed)
  NEUROSURGERY PROGRESS NOTE   Pt seen and examined. No issues overnight.   EXAM: Temp:  [97.5 F (36.4 C)-100 F (37.8 C)] 98.4 F (36.9 C) (05/28 0800) Pulse Rate:  [61-89] 89 (05/28 0800) Resp:  [16-23] 20 (05/28 0800) BP: (94-161)/(44-108) 141/61 (05/28 0800) SpO2:  [86 %-100 %] 96 % (05/28 0800) Intake/Output      05/27 0701 05/28 0700 05/28 0701 05/29 0700   P.O. 1.5    I.V. (mL/kg) 2255.3 (21.5)    NG/GT     IV Piggyback 110    Total Intake(mL/kg) 2366.8 (22.5)    Urine (mL/kg/hr) 560 (0.2)    Drains 125    Blood     Total Output 685    Net +1681.8         Urine Occurrence 1 x     Easily arouses Follows commands RUE, BLE. At least antigravity LUE Headwrap in place, JP with ~125cc overnight  LABS: Lab Results  Component Value Date   CREATININE 1.53 (H) 07/23/2023   BUN 24 (H) 07/23/2023   NA 140 07/23/2023   K 4.9 07/23/2023   CL 109 07/23/2023   CO2 21 (L) 07/23/2023   Lab Results  Component Value Date   WBC 5.0 07/22/2023   HGB 8.7 (L) 07/22/2023   HCT 27.2 (L) 07/22/2023   MCV 96.8 07/22/2023   PLT 141 (L) 07/22/2023    IMAGING: CTH this amreviewed demonstrating significant improvement in size of residual right SDH, decreased MLS. No HCP.   IMPRESSION: - 83 y.o. male POD#1 right crani for acute SDH, neurologically improving with CT demonstrating improvement in residual SDH.   PLAN: - Cont Keppra  - Will cont JP for one more day - PT/OT eval today   Augusto Blonder, MD [facility]

## 2023-07-23 NOTE — Progress Notes (Signed)
 Inpatient Rehab Admissions Coordinator Note:   Per therapy recommendations patient was screened for CIR candidacy by Mickey Alar, PT. At this time, pt appears to be a potential candidate for CIR. I will place an order for rehab consult for full assessment, per our protocol.  Please contact me any with questions.Loye Rumble, PT, DPT [phone] 07/23/23 3:27 PM

## 2023-07-23 NOTE — Progress Notes (Signed)
 NAME:  Jack White, MRN:  [id number], DOB:  Sep 30, 1940, LOS: 1 ADMISSION DATE:  07/21/2023, CONSULTATION DATE:  5/27 REFERRING MD:  Dr. Harless Lien, CHIEF COMPLAINT:  SDH   History of Present Illness:  Patient is an 83 yo M w/ pertinent PMH HTN, HLD, DMT2, parkinson disease, trigeminal neuralgia, CKD 3a presents to [facility] on 5/26 w/ SDH.  On 5/26 patient fell around 9 pm. Daughter states he was initially okay just having HA. About 2 hours later having N/V and became altered. EMS came finding patient w/ left arm/leg weakness. Brought to [facility] as code stroke. On arrival normotensive and afebrile. Cbg 144. CT showing large right holohemispheric acute SDH; subfacline herniation and impending uncal herniation. NSG consulted w/ plans to take to OR for emergent craniotomy. Started on cleviprex for SBP <140. Given Keppra 1000. Patient intubated for airway protection. PCCM consulted for vent/medical management.  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 Intubated for airway protection. NSG taking to OR. Pccm consulted > extubated by afternoon 5/28 intermittent episodes of agitation/confusion with difficulty sleeping overnight  Interim History / Subjective:  Resting this a.m. after receiving as needed Vicodin Daughter reports patient struggles regularly with slepp patient typically uses 2 mg of melatonin with 2 Benadryl  tablets for sleep at night baseline  Objective    Blood  pressure (!) 137/59, pulse 86, temperature 99.1 F (37.3 C), temperature source Axillary, resp. rate 19, height 6' (1.829 m), weight 105 kg, SpO2 96%.    Vent Mode: PRVC FiO2 (%):  [40 %] 40 % Set Rate:  [18 bmp] 18 bmp Vt Set:  [308 mL] 620 mL PEEP:  [5 cmH20] 5 cmH20 Plateau Pressure:  [22 cmH20] 22 cmH20   Intake/Output Summary (Last 24 hours) at 07/23/2023 6578 Last data filed at 07/23/2023 0700 Gross per 24 hour  Intake 2366.82 ml  Output 685 ml  Net 1681.82 ml   Filed Weights   07/21/23 2300  Weight: 105 kg    Examination: General: Acute on chronic ill-appearing deconditioned elderly male lying in bed in no acute distress HEENT: Gann/AT, MM pink/moist, PERRL,  Neuro: Sleepy but easily arousable CV: s1s2 regular rate and rhythm, no murmur, rubs, or gallops,  PULM: Clear to auscultation bilaterally, no increased work of breathing, no added breath sounds, on room air GI: soft, bowel sounds active in all 4 quadrants, non-tender, non-distended Extremities: warm/dry, no edema  Skin: no rashes or lesions  Resolved problem list  Acute respiratory failure  Assessment and Plan   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  P: Management per neurosurgery Continue empiric Keppra SBP goal less than 140 No protective measures Minimize sedation Initiation of chemical VTE prophylaxis per neurosurgery Home aspirin  remains on hold  DM2  P: Continue moderate scale SSI CBG checks every 4 CBG goal 140-180  CKD 3a Hyperkalemia  P: Follow renal function  Monitor urine output Trend Bmet Avoid nephrotoxins Ensure adequate renal perfusion   Possible parkinsons  dz Trigeminal neuralgia Peripheral neuropathy  P: Continue home gabapentin  and Sinemet  Resume Cymbalta  and Tegretol  when able   HTN HLD P: Continuous telemetry Continue home statin Home aspirin  remains on hold Home beta-blocker on hold will resume when appropriate  BPH P: Continue Flomax  and  Cardura   Left heal ulcer poa -seen by podiatry at [facility] on 5/22 s/p debridement P: Local wound care Pressure relieving devices WOC following  Insomnia -Daughter reports patient struggles regularly with slepp patient typically uses 2 mg of melatonin with 2 Benadryl  tablets for sleep at night baseline P: Start melatonin Consider initiating trazodone  versus trialing home Benadryl  once neuroexam will allow  Best Practice (right click and "Reselect all SmartList Selections" daily)   Diet/type: NPO w/ meds via tube DVT prophylaxis SCD Pressure ulcer(s): present on admission  GI prophylaxis: H2B Lines: N/A Foley:  N/A Code Status:  full code Last date of multidisciplinary goals of care discussion 5/27 spoke w/ daughter at bedside. States to do everything for now but patient would not want to be on vent long term.  Critical care: NA   Dauntae Derusha D. Harris, NP-C [facility] Personal contact information can be found on Amion  If no contact or response made please call [phone] 07/23/2023, 9:43 AM

## 2023-07-23 NOTE — Progress Notes (Signed)
 eLink Physician-Brief Progress Note Patient Name: Jack White DOB: 07-22-1940 MRN: [id number]   Date of Service  07/23/2023  HPI/Events of Note  Insomnia  eICU Interventions  On Melatonin     Intervention Category Minor Interventions: Other:  Rexann Catalan 07/23/2023, 8:01 PM  00:14 Bedside RN states patient is restrained on R wrist but now becoming stronger on L hand that has mitt and is attempting to pull Cortrak. RN asking for order for Bilat wrist restraints now.  Camera evaluation done .for Bilateral soft wrist restraints request from bed side RN, to prevent self injury and harm while critically ill, altered mentation. Has Ng tube. Not on vent.

## 2023-07-24 ENCOUNTER — Other Ambulatory Visit: Payer: Self-pay

## 2023-07-24 DIAGNOSIS — R1310 Dysphagia, unspecified: Secondary | ICD-10-CM

## 2023-07-24 DIAGNOSIS — S065XAA Traumatic subdural hemorrhage with loss of consciousness status unknown, initial encounter: Secondary | ICD-10-CM | POA: Diagnosis not present

## 2023-07-24 DIAGNOSIS — R32 Unspecified urinary incontinence: Secondary | ICD-10-CM | POA: Diagnosis not present

## 2023-07-24 DIAGNOSIS — I10 Essential (primary) hypertension: Secondary | ICD-10-CM | POA: Diagnosis not present

## 2023-07-24 DIAGNOSIS — E440 Moderate protein-calorie malnutrition: Secondary | ICD-10-CM | POA: Diagnosis not present

## 2023-07-24 DIAGNOSIS — E119 Type 2 diabetes mellitus without complications: Secondary | ICD-10-CM | POA: Diagnosis not present

## 2023-07-24 DIAGNOSIS — N1831 Chronic kidney disease, stage 3a: Secondary | ICD-10-CM | POA: Diagnosis not present

## 2023-07-24 LAB — GLUCOSE, CAPILLARY
Glucose-Capillary: 179 mg/dL — ABNORMAL HIGH (ref 70–99)
Glucose-Capillary: 228 mg/dL — ABNORMAL HIGH (ref 70–99)
Glucose-Capillary: 198 mg/dL — ABNORMAL HIGH (ref 70–99)
Glucose-Capillary: 192 mg/dL — ABNORMAL HIGH (ref 70–99)
Glucose-Capillary: 170 mg/dL — ABNORMAL HIGH (ref 70–99)
Glucose-Capillary: 213 mg/dL — ABNORMAL HIGH (ref 70–99)

## 2023-07-24 LAB — PHOSPHORUS: Phosphorus: 3.4 mg/dL (ref 2.5–4.6)

## 2023-07-24 LAB — MAGNESIUM: Magnesium: 1.9 mg/dL (ref 1.7–2.4)

## 2023-07-24 MED ORDER — MAGNESIUM SULFATE 2 GM/50ML IV SOLN
2.0000 g | Freq: Once | INTRAVENOUS | Status: AC
  Administered 2023-07-24: 2 g via INTRAVENOUS
  Filled 2023-07-24: qty 50

## 2023-07-24 MED ORDER — CARBAMAZEPINE 200 MG PO TABS
200.0000 mg | ORAL_TABLET | Freq: Three times a day (TID) | ORAL | Status: DC
  Administered 2023-07-24 – 2023-08-10 (×50): 200 mg
  Filled 2023-07-24 (×56): qty 1

## 2023-07-24 MED ORDER — FAMOTIDINE 20 MG PO TABS
20.0000 mg | ORAL_TABLET | Freq: Every day | ORAL | Status: DC
  Administered 2023-07-25 – 2023-08-10 (×17): 20 mg
  Filled 2023-07-24 (×17): qty 1

## 2023-07-24 MED ORDER — METOPROLOL TARTRATE 5 MG/5ML IV SOLN: 2.5000 mg | INTRAVENOUS | Status: DC | PRN

## 2023-07-24 MED ORDER — METOPROLOL TARTRATE 25 MG PO TABS
12.5000 mg | ORAL_TABLET | Freq: Two times a day (BID) | ORAL | Status: DC
  Administered 2023-07-24 (×2): 12.5 mg via ORAL
  Filled 2023-07-24 (×2): qty 1

## 2023-07-24 NOTE — IPAL (Signed)
  Interdisciplinary Goals of Care Family Meeting   Date carried out 07/24/2023  Location of the meeting: Phone conference  Member's involved: Nurse Practitioner and Family Member or next of kin  Durable Power of Attorney or acting medical decision maker: Jack White - Daughter     Discussion: Discussion held with Jack White, daughter, this afternoon to discuss code status, Jack White confirms that Jack White would not want chest compressions in the event of a cardiac arrest. We also discussed the possibility of a respiratory arrest and she stated that [name] would be okay with a short intubation course in the event of a respiratory arrest but he would not want a prolonged intubation course and would never want a tracheostomy tube. Code status order changed to reflect family wishes.   Code status:   Code Status: Do not attempt resuscitation (DNR) PRE-ARREST INTERVENTIONS DESIRED   Disposition: Continue current acute care  Time spent for the meeting: 35 min   Jilda Kress D. Harris, NP-C  [facility] Personal contact information can be found on Amion  If no contact or response made please call [phone] 07/24/2023, 2:21 PM

## 2023-07-24 NOTE — Consult Note (Signed)
 Physical Medicine and Rehabilitation Consult Reason for Consult: Evaluate appropriateness for Inpatient Rehab Referring Physician: Dr. Nat Badger    HPI: Jack White is a 83 y.o. 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 . They were admitted to [facility] on 07/21/2023 for suspected stroke after having a fall with his rolling walker at approximately 9 PM, and 2 hours later developing significant nausea and vomiting leading to unresponsiveness.  In the ER, noncontrast CT showed a large right-sided fronto parietal SDH with 1.6 cm shift, subfalcine herniation and impending uncal herniation.  He underwent emergent craniotomy with Dr. Nat Badger with evacuation.  Who started on Cleviprex  for blood pressure and Keppra  for seizure prophylaxis, was intubated for airway protection.  He was extubated later that afternoon.  On 5/28, was noted to have episodes of agitation and confusion and difficulty sleeping, which continues.  His hospitalization was otherwise complicated by pre-existing Parkinson's disease, CKD, diabetes, hypertension, trigeminal neuralgia/peripheral neuropathy, urinary tension, left ear ulcer, dysphagia with core track, and insomnia.  PM&R was consulted to evaluate appropriateness for IPR admission.    On exam, patient does not respond to commands, no verbalizations, keeps eyes closed with occasional moans.  Per nursing, he has been like this most of today, did participate a little bit with edge of bed therapies earlier but is not following commands for the most part.    Per chart review, patient lives alone  in a single-story mobile home with a ramped entrance.  He is independent to modified independent at baseline with a rolling walker for ADLs, gets assistance with cooking.  He is currently on an n.p.o. diet with meds crushed with pure if alert.  He is currently +2 total assist for bed mobility and transfers.  He can statically sit at a contact-guard to min assist level due to fatiguing, complicated by left lean.  Requires total assist for ADLs.  ROS unable to determine due to cognitive status Past Medical History:  Diagnosis Date   Acute cholecystitis 05/21/14   BP (high blood pressure) 10/14/2014   Chronic kidney disease (CKD), stage III (moderate) (HCC) 02/15/2014   Colon perforation (HCC) 05/28/2014   Diabetes mellitus without complication (HCC)    [name]'s neuralgia 10/14/2014   Headache    Hypertension    Intractable nausea and vomiting 04/07/2023   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   Past Surgical History:  Procedure Laterality Date   COLOSTOMY  05/28/2014   Dr. Luise Saint   COLOSTOMY TAKEDOWN N/A 10/26/2014   Procedure: COLOSTOMY TAKEDOWN;  Surgeon: Rhina Center III, MD;  Location: [facility] ORS;  Service: General;  Laterality: N/A;   CRANIOTOMY Right 07/21/2023   Procedure: CRANIOTOMY HEMATOMA EVACUATION SUBDURAL;  Surgeon: Augusto Blonder, MD;  Location: [facility] OR;  Service: Neurosurgery;  Laterality: Right;   EXPLORATORY LAPAROTOMY  05/28/2014   Dr. Luise Saint   LAPAROSCOPIC CHOLECYSTECTOMY  05/21/2014   Dr. Amalia Badder   Family History  Problem Relation Age of Onset   Diabetes Mother    Hypertension Mother    Alzheimer's disease Mother    Heart disease Father    Diabetes Sister    Social History:  reports that he has never smoked. He has never used smokeless tobacco. He reports that  he does not drink alcohol and does not use drugs. Allergies:  Allergies  Allergen Reactions   Citrus    Dilaudid [Hydromorphone Hcl] Other (See Comments)    Respiratory arrest   Tomato Other (See Comments)    GI  distress, respiratory distress   Medications Prior to Admission  Medication Sig Dispense Refill   acetaminophen  (TYLENOL ) 500 MG tablet Take 500-1,000 mg by mouth every 6 (six) hours as needed for moderate pain (pain score 4-6).     aspirin  81 MG tablet Take 81 mg by mouth daily.     atorvastatin  (LIPITOR) 10 MG tablet Take 5 mg by mouth at bedtime.     carbamazepine  (CARBATROL ) 200 MG 12 hr capsule Take 200 mg by mouth 3 (three) times daily.     carbidopa -levodopa  (SINEMET  IR) 25-100 MG tablet Take 1 tablet by mouth 3 (three) times daily.     clonazePAM (KLONOPIN) 0.5 MG tablet Take 0.25 mg by mouth daily as needed for anxiety.     diphenhydrAMINE  (BENADRYL ) 25 MG tablet Take 25-50 mg by mouth at bedtime.     DULoxetine  (CYMBALTA ) 60 MG capsule Take 60 mg by mouth daily.     ergocalciferol (VITAMIN D2) 1.25 MG (50000 UT) capsule Take 50,000 Units by mouth once a week.     fluticasone  (FLONASE ) 50 MCG/ACT nasal spray Place 2 sprays into both nostrils daily as needed for allergies.     gabapentin  (NEURONTIN ) 300 MG capsule Take 300 mg by mouth at bedtime.     hydrocortisone  cream 1 % Apply topically 3 (three) times daily. (Patient taking differently: Apply 1 Application topically 3 (three) times daily as needed for itching.) 30 g 0   MELATONIN PO Take 1 tablet by mouth at bedtime.     metFORMIN (GLUCOPHAGE-XR) 500 MG 24 hr tablet Take 500 mg by mouth daily.     metoprolol  succinate (TOPROL -XL) 25 MG 24 hr tablet Take 25 mg by mouth daily.     montelukast  (SINGULAIR ) 10 MG tablet Take 10 mg by mouth at bedtime.     omeprazole (PRILOSEC) 20 MG capsule Take 20 mg by mouth daily.     tamsulosin  (FLOMAX ) 0.4 MG CAPS capsule Take 0.4 mg by mouth daily after breakfast.     carbamazepine  (TEGRETOL  XR) 200 MG 12 hr tablet Take 1 tablet (200 mg total) by mouth 3 (three) times daily. (Patient not taking: Reported on 07/22/2023)     doxazosin  (CARDURA ) 4 MG tablet Take 4 mg by mouth daily. (Patient not  taking: Reported on 07/22/2023)     ondansetron  (ZOFRAN ) 4 MG tablet Take 1 tablet (4 mg total) by mouth every 6 (six) hours as needed for nausea. (Patient not taking: Reported on 07/22/2023) 20 tablet 0   ondansetron  (ZOFRAN -ODT) 8 MG disintegrating tablet Take 8 mg by mouth every 8 (eight) hours as needed. (Patient not taking: Reported on 07/22/2023)      Home: Home Living Family/patient expects to be discharged to:: Private residence Living Arrangements: Alone Available Help at Discharge: Family, Available PRN/intermittently Type of Home: Mobile home Home Access: Ramped entrance Home Layout: One level Bathroom Shower/Tub: Walk-in shower Home Equipment: Agricultural consultant (2 wheels), Information systems manager, Wheelchair - manual  Functional History: Prior Function Prior Level of Function : Independent/Modified Independent Mobility Comments: Mod indep with RW for ADLs, recently discharged from OPPT, was about to start Parkinson's [employer] exercise group ADLs Comments: Independent ADL's, can cut up vegetable sitting at table, but otherwise does not cook. Functional Status:  Mobility: Bed Mobility Overal bed mobility: Needs Assistance Bed Mobility: Supine to Sit, Sit to Supine Supine to sit: +2 for physical assistance, Total assist Sit to supine: +2 for physical assistance, Total assist General bed mobility comments: Decreased initiation by pt Transfers Overall transfer level: Needs assistance Equipment used: Rolling walker (2 wheels) Transfers: Sit to/from Stand Sit to Stand: Max assist, +2 physical assistance General transfer comment: Did not initiate with +2 assist and bed pad to lift up hips Ambulation/Gait General Gait Details: unable    ADL: ADL General ADL Comments: Requires total A either at bed level or sitting EOB to complete BADL tasks. Hand over hand assist provided for grooming and self feeding task during evaluation  Cognition: Cognition Orientation Level: Disoriented  X4 Cognition Arousal: Lethargic Behavior During Therapy: Flat affect  Blood pressure (!) 109/57, pulse 77, temperature 99.2 F (37.3 C), temperature source Axillary, resp. rate 14, height 6' (1.829 m), weight 104.8 kg, SpO2 93%. Physical Exam Constitutional: No apparent distress. Appropriate appearance for age.  Sitting upright in bed. HENT: Hemicrania site with minimal swelling, drain removed.  Otherwise normal appearance. Eyes: Limited due to keeping preferentially closed; pupils reduced in side but appear reactive to light, equal bilaterally.  Responds to blink to threat bilaterally.  Will not track.  Cardiovascular: RRR, no murmurs/rub/gallops.  1+ bilateral edema.  Respiratory: CTAB. No rales, rhonchi, or wheezing. On RA.  Intermittent dry cough. Abdomen: + bowel sounds, normoactive. No distention or tenderness. + Core track GU: Not examined. + purwick, draining clear urine.  Skin: Surgical staples well-approximated along the right lateral scalp and 2 over prior drain site on posterior head.  Fuhs bruising.  MSK:      No apparent deformity.  Bilateral mitts.  Bilateral knee replacements       Neurologic exam:  Keeps eyes closed, does not respond to questions or commands.  Makes intermittent groans only, no verbalizations. Localizes to painful stimuli in right upper and lower extremity, somewhat in left upper extremity, not left lower extremity. Moves all 4 extremities against resistance. Some cogwheel rigidity noted in bilateral upper extremities, but no obvious spasticity. Bilateral upper extremity hyperreflexia, bilateral lower extremities reduced.  Results for orders placed or performed during the hospital encounter of 07/21/23 (from the past 24 hours)  Glucose, capillary     Status: Abnormal   Collection Time: 07/23/23  3:43 PM  Result Value Ref Range   Glucose-Capillary 145 (H) 70 - 99 mg/dL  Basic metabolic panel     Status: Abnormal   Collection Time: 07/23/23  6:28 PM   Result Value Ref Range   Sodium 140 135 - 145 mmol/L   Potassium 4.9 3.5 - 5.1 mmol/L   Chloride 109 98 - 111 mmol/L   CO2 24 22 - 32 mmol/L   Glucose, Bld 158 (H) 70 - 99 mg/dL   BUN 24 (H) 8 - 23 mg/dL   Creatinine, Ser 1.61 (H) 0.61 - 1.24 mg/dL   Calcium  8.0 (L) 8.9 - 10.3 mg/dL   GFR, Estimated 45 (L) >60 mL/min   Anion gap 7 5 - 15  Magnesium      Status: None   Collection Time: 07/23/23  6:28 PM  Result Value Ref Range   Magnesium  1.8 1.7 - 2.4 mg/dL  Glucose, capillary     Status: Abnormal   Collection Time: 07/23/23  7:20 PM  Result Value Ref Range   Glucose-Capillary 140 (H) 70 - 99 mg/dL  Glucose, capillary  Status: Abnormal   Collection Time: 07/23/23 11:24 PM  Result Value Ref Range   Glucose-Capillary 165 (H) 70 - 99 mg/dL  Glucose, capillary     Status: Abnormal   Collection Time: 07/24/23  3:22 AM  Result Value Ref Range   Glucose-Capillary 179 (H) 70 - 99 mg/dL  Magnesium      Status: None   Collection Time: 07/24/23  5:07 AM  Result Value Ref Range   Magnesium  1.9 1.7 - 2.4 mg/dL  Phosphorus     Status: None   Collection Time: 07/24/23  5:07 AM  Result Value Ref Range   Phosphorus 3.4 2.5 - 4.6 mg/dL  Glucose, capillary     Status: Abnormal   Collection Time: 07/24/23 11:35 AM  Result Value Ref Range   Glucose-Capillary 198 (H) 70 - 99 mg/dL   CT HEAD WO CONTRAST ( ) Result Date: 07/23/2023 CLINICAL DATA:  83 year old male with right side subdural hematoma, postoperative day 1 craniotomy and evacuation. EXAM: CT HEAD WITHOUT CONTRAST TECHNIQUE: Contiguous axial images were obtained from the base of the skull through the vertex without intravenous contrast. RADIATION DOSE REDUCTION: This exam was performed according to the departmental dose-optimization program which includes automated exposure control, adjustment of the mA and/or kV according to patient size and/or use of iterative reconstruction technique. COMPARISON:  Postoperative head CT yesterday  and earlier. FINDINGS: Brain: Right side subdural drain remains in place. Mixed density right side subdural hematoma has further decreased, ranging from about 6 mm thickness over the right posterior convexity (previously 11 mm) to 14 mm over the more anterior right cerebral convexity (up to 16 mm yesterday). Corresponding, the intracranial mass effect and leftward midline shift have decreased. Leftward midline shift now is 6-7 mm (9-10 mm yesterday). And right lateral ventricle patency has improved. Left lateral ventricle remains stable. Much smaller mixed density left side subdural hematoma now measures 2-3 mm at most levels and appears smaller (coronal image 45). A small volume of mostly right side para falcine and tentorial blood has not significantly changed. Basilar cisterns are further improved, particularly the suprasellar cistern. There is a small volume of subdural blood suspected at the ventral foramen magnum, stable. Stable gray-white matter differentiation throughout the brain. No cortically based acute infarct identified. Vascular: Calcified atherosclerosis at the skull base. No suspicious intracranial vascular hyperdensity. Skull: Stable right side craniotomy. Sinuses/Orbits: Visualized paranasal sinuses and mastoids are stable and well aerated. Other: Stable right scalp postoperative changes, skin staples, percutaneous drain. IMPRESSION: 1. Further decreased right side Subdural Hematoma (maximal 14 mm now), intracranial mass effect, and leftward midline shift (6-7 mm now) since yesterday. Right subdural drain remains in place. 2. Stable to decreased much smaller Left side Subdural Hematoma (2-3 mm). 3. No new intracranial abnormality. Electronically Signed   By: Marlise Simpers M.D.   On: 07/23/2023 05:50    Assessment/Plan: Diagnosis: SDH status post craniotomy and evacuation Does the need for close, 24 hr/day medical supervision in concert with the patient's rehab needs make it unreasonable for this  patient to be served in a less intensive setting? Yes Co-Morbidities requiring supervision/potential complications: Cognitive deficits/positive behavior, Parkinson's disease, CKD, diabetes, hypertension, trigeminal neuralgia/peripheral neuropathy, urinary tension,  dysphagia with core track, incontinence, and insomnia/agitation.  Due to bladder management, bowel management, safety, skin/wound care, disease management, medication administration, pain management, and patient education, does the patient require 24 hr/day rehab nursing? Yes Does the patient require coordinated care of a physician, rehab nurse, therapy disciplines of PT, OT, and  SLP to address physical and functional deficits in the context of the above medical diagnosis(es)? Yes Addressing deficits in the following areas: balance, endurance, locomotion, strength, transferring, bowel/bladder control, bathing, dressing, feeding, grooming, toileting, cognition, speech, language, swallowing, and psychosocial support Can the patient actively participate in an intensive therapy program of at least 3 hrs of therapy per day at least 5 days per week? Potentially The potential for patient to make measurable gains while on inpatient rehab is good Anticipated functional outcomes upon discharge from inpatient rehab are Min A to Supervision with PT, min assist to supervision with OT, Min A to Mod A with SLP. Estimated rehab length of stay to reach the above functional goals is: 18-21 days Anticipated discharge destination: TBD  Overall Rehab/Functional Prognosis: fair  POST ACUTE RECOMMENDATIONS: This patient's condition is appropriate for continued rehabilitative care in the following setting: TBD, pending  physical improvements and support may be appropriate for CIR vs SNF  Patient has agreed to participate in recommended program. N/A Note that insurance prior authorization may be required for reimbursement for recommended care.  Comment: Mr.  Pollack is an 83 year old male presenting status post SDH with a pre-existing history of possible parkinson's disease, now status post craniotomy and subdural evacuation, with fatigue, agitation, and left-sided hemiparesis.  So far, therapy tolerance has been limited by minimal tolerance of edge of bed, otherwise not really following commands, localizing somewhat to pain, most consistent to Rancho level III.    Patient is not yet able to appreciably participate in CIR for 3 hours a day, but may approach that in the next few days if he starts following commands more purposefully.  Additionally, we will need to identify 24/7 supervision for home discharge before excepting CIR.   MEDICAL RECOMMENDATIONS: Consider initiation of nightly Seroquel 25 to 50 mg to reduce agitation and help reestablish sleep-wake cycle. With scheduled Tylenol  650 mg every 4 hours for pain, and DC gabapentin  as this can be more sedating in elderly May benefit from initiation of daytime stimulant like Ritalin 5 mg twice daily to improve focus and command following, but would prioritize #1 and #2 first No recorded bowel movement since admission; would consider adding senna syrup twice daily   I have personally performed a face to face diagnostic evaluation of this patient. Additionally, I have examined the patient's medical record including any pertinent labs and radiographic images. If the physician assistant has documented in this note, I have reviewed and edited or otherwise concur with the physician assistant's documentation.  Thanks,  Bea Lime, DO 07/24/2023

## 2023-07-24 NOTE — Progress Notes (Signed)
  NEUROSURGERY PROGRESS NOTE   Pt seen and examined. No issues overnight.   EXAM: Temp:  [98.1 F (36.7 C)-99.4 F (37.4 C)] 99.4 F (37.4 C) (05/29 0752) Pulse Rate:  [70-109] 93 (05/29 0900) Resp:  [13-22] 20 (05/29 0900) BP: (116-152)/(45-86) 117/56 (05/29 0900) SpO2:  [93 %-100 %] 93 % (05/29 0900) Weight:  [104.8 kg] 104.8 kg (05/29 0500) Intake/Output      05/28 0701 05/29 0700 05/29 0701 05/30 0700   P.O.     I.V. (mL/kg) 49 (0.5)    NG/GT 429.2 30   IV Piggyback 10    Total Intake(mL/kg) 488.2 (4.7) 30 (0.3)   Urine (mL/kg/hr) 950 (0.4)    Drains 5    Total Output 955    Net -466.8 +30         Easily arouses Not conversant this morning, mostly moaning to stimulus Follows commands RUE, BLE. At least antigravity LUE JP scant output  LABS: Lab Results  Component Value Date   CREATININE 1.52 (H) 07/23/2023   BUN 24 (H) 07/23/2023   NA 140 07/23/2023   K 4.9 07/23/2023   CL 109 07/23/2023   CO2 24 07/23/2023   Lab Results  Component Value Date   WBC 5.0 07/22/2023   HGB 8.7 (L) 07/22/2023   HCT 27.2 (L) 07/22/2023   MCV 96.8 07/22/2023   PLT 141 (L) 07/22/2023    IMPRESSION: - 82 y.o. male POD#2 right crani for acute SDH, neurologically improving with CT demonstrating improvement in residual SDH.   PLAN: - Will observe in ICU for today - Cont Keppra  - JP d/c'ed at bedside today - Cont PT/OT   Augusto Blonder, MD [facility]

## 2023-07-24 NOTE — Progress Notes (Signed)
 Physical Therapy Treatment Patient Details Name: Jack White MRN: [id number] DOB: 1940/11/04 Today's Date: 07/24/2023   History of Present Illness Patient is an 83 y.o. male who presented to the ED via EMS 07/22/2023 as a code stroke after becoming unresponsive at home. He was intubated upon arrival. CT head showed large right holohemispheric acute SDH. Neurosurgery was consulted and patient underwent a  right craniotomy on 07/22/23. 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    PT Comments  Pt more lethargic today, affecting participation and interaction with therapy. Transitioned to edge of bed to promote upright and improve arousal. Pt not following any commands, but spontaneously reaching for face to attempt to pull at [place] in nose and tapping R foot to Jack White music. Will continue to monitor and progress as tolerated.    If plan is discharge home, recommend the following: Two people to help with walking and/or transfers;Two people to help with bathing/dressing/bathroom   Can travel by private vehicle        Equipment Recommendations  Other (comment) (defer)    Recommendations for Other Services Rehab consult     Precautions / Restrictions Precautions Precautions: Fall;Other (comment) Recall of Precautions/Restrictions: Impaired Precaution/Restrictions Comments: JP drain Restrictions Weight Bearing Restrictions Per Provider Order: No     Mobility  Bed Mobility Overal bed mobility: Needs Assistance Bed Mobility: Supine to Sit, Sit to Supine     Supine to sit: +2 for physical assistance, Total assist Sit to supine: +2 for physical assistance, Total assist   General bed mobility comments: Decreased initiation by pt    Transfers                   General transfer comment: Did not initiate with +2 assist and bed pad to lift up hips    Ambulation/Gait               General Gait Details: unable   Stairs              Wheelchair Mobility     Tilt Bed    Modified Rankin (Stroke Patients Only) Modified Rankin (Stroke Patients Only) Pre-Morbid Rankin Score: Moderate disability Modified Rankin: Severe disability     Balance Overall balance assessment: Needs assistance Sitting-balance support: Feet supported Sitting balance-Leahy Scale: Fair Sitting balance - Comments: Initially requiring minA progressing to CGA                                    Communication Communication Communication: Impaired Factors Affecting Communication: Hearing impaired  Cognition Arousal: Lethargic Behavior During Therapy: Flat affect   PT - Cognitive impairments: Orientation, Awareness, Memory, Attention, Initiation, Sequencing, Problem solving, Safety/Judgement                       PT - Cognition Comments: Pt more lethargic this AM, moaning, no verbalizations or command following with limited instances of opening eyes Following commands: Impaired Following commands impaired: Follows one step commands inconsistently    Cueing Cueing Techniques: Verbal cues  Exercises      General Comments        Pertinent Vitals/Pain Pain Assessment Pain Assessment: Faces Faces Pain Scale: Hurts little more Pain Location: head Pain Descriptors / Indicators: Aching, Discomfort, Moaning Pain Intervention(s): Limited activity within patient's tolerance, Monitored during session, Premedicated before session, Repositioned    Home Living  Prior Function            PT Goals (current goals can now be found in the care plan section) Acute Rehab PT Goals Patient Stated Goal: pt daughter agreeable to rehab PT Goal Formulation: With patient/family Time For Goal Achievement: 08/06/23 Potential to Achieve Goals: Fair Progress towards PT goals: Not progressing toward goals - comment (more lethargic today)    Frequency    Min 3X/week      PT Plan       Co-evaluation              AM-PAC PT "6 Clicks" Mobility   Outcome Measure  Help needed turning from your back to your side while in a flat bed without using bedrails?: A Lot Help needed moving from lying on your back to sitting on the side of a flat bed without using bedrails?: Total Help needed moving to and from a bed to a chair (including a wheelchair)?: Total Help needed standing up from a chair using your arms (e.g., wheelchair or bedside chair)?: Total Help needed to walk in hospital room?: Total Help needed climbing 3-5 steps with a railing? : Total 6 Click Score: 7    End of Session Equipment Utilized During Treatment: Gait belt Activity Tolerance: Patient tolerated treatment well Patient left: in bed;with call bell/phone within reach;with bed alarm set;with family/visitor present Nurse Communication: Mobility status PT Visit Diagnosis: Unsteadiness on feet (R26.81);History of falling (Z91.81);Difficulty in walking, not elsewhere classified (R26.2);Pain Pain - part of body:  (head)     Time: 6295-2841 PT Time Calculation (min) (ACUTE ONLY): 33 min  Charges:    $Therapeutic Activity: 23-37 mins PT General Charges $$ ACUTE PT VISIT: 1 Visit                     Verdia Glad, PT, DPT Acute Rehabilitation Services Office [phone]    Claria Crofts 07/24/2023, 10:21 AM

## 2023-07-24 NOTE — Progress Notes (Signed)
 Inpatient Rehabilitation Admissions Coordinator   I will follow up with patient's progress. Please note Dr Rheba Cedar rehab consult.  Jeannetta Millman, RN, MSN Rehab Admissions Coordinator [phone] 07/24/2023 4:01 PM

## 2023-07-24 NOTE — Progress Notes (Signed)
 Pt's daughter, Federico Hopkins, came to me to let me know the family (pt's 2 step daughters, grandson, and son-in-law), all agreed that the patient had verbalized a number of times that he wanted to be DNR. The daughter, Genevia Kern, is the decision maker for the patient and asked that I let the physicians know that this should be noted in pt's chart. Giomar Gusler C 1:16 PM

## 2023-07-24 NOTE — Progress Notes (Signed)
 NAME:  TREMAIN RUCINSKI, MRN:  [id number], DOB:  Dec 16, 1940, LOS: 2 ADMISSION DATE:  07/21/2023, CONSULTATION DATE:  5/27 REFERRING MD:  Dr. Harless Lien, CHIEF COMPLAINT:  SDH   History of Present Illness:  Patient is an 83 yo M w/ pertinent PMH HTN, HLD, DMT2, parkinson disease, trigeminal neuralgia, CKD 3a presents to [facility] on 5/26 w/ SDH.  On 5/26 patient fell around 9 pm. Daughter states he was initially okay just having HA. About 2 hours later having N/V and became altered. EMS came finding patient w/ left arm/leg weakness. Brought to [facility] as code stroke. On arrival normotensive and afebrile. Cbg 144. CT showing large right holohemispheric acute SDH; subfacline herniation and impending uncal herniation. NSG consulted w/ plans to take to OR for emergent craniotomy. Started on cleviprex for SBP <140. Given Keppra 1000. Patient intubated for airway protection. PCCM consulted for vent/medical management.  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 Intubated for airway protection. NSG taking to OR. Pccm consulted > extubated by afternoon 5/28 intermittent episodes of agitation/confusion with difficulty sleeping overnight 5/29 difficulty with restlessness overnight, sleeping poorly on a.m. assessment  Interim History / Subjective:  Sleepy this a.m. will respond to painful stimuli  Objective    Blood pressure (!) 126/51, pulse 82, temperature 99.4 F (37.4 C), temperature source  Axillary, resp. rate 13, height 6' (1.829 m), weight 104.8 kg, SpO2 100%.        Intake/Output Summary (Last 24 hours) at 07/24/2023 4098 Last data filed at 07/24/2023 0700 Gross per 24 hour  Intake 488.17 ml  Output 955 ml  Net -466.83 ml   Filed Weights   07/21/23 2300 07/24/23 0500  Weight: 105 kg 104.8 kg    Examination: General: Acute on chronic ill-appearing elderly male lying in bed sleeping deeply no acute distress HEENT: [place]/AT, MM pink/moist, PERRL,  Neuro: Sleepy but responds to painful stimuli CV: s1s2 regular rate and rhythm, no murmur, rubs, or gallops,  PULM: Clear to auscultation bilaterally, no increased work of breathing, no added breath sounds GI: soft, bowel sounds active in all 4 quadrants, non-tender, non-distended, tolerating TF Extremities: warm/dry, no edema  Skin: no rashes or lesions   Resolved problem list  Acute respiratory failure  Assessment and Plan   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  P: Management per neurosurgery Continue empiric Keppra SBP less than 140 Neuroprotective measures Minimize sedation Initiation of chemical VTE prophylaxis per neurosurgery Home aspirin  remains on hold  DM2  P: Continue moderate scale SSI CBG checks every 4 CBG goal 140-180  CKD 3a Hyperkalemia  P: Follow renal function Monitor urine output Trend Bment Ensure adequate renal perfusion  Possible parkinsons dz Trigeminal neuralgia Peripheral neuropathy  P: Continue home gabapentin  and Sinemet  At home Tegretol  immediate release Unable to give Cymbalta  via core track hold  HTN HLD P: Continuous telemetry Continue home statin Home aspirin  remains on hold Resume home beta-blocker  BPH P: Continue home Flomax  and Cardura   Left heal ulcer poa -seen by podiatry at [facility] on 5/22 s/p debridement P: Local wound care Pressure relieving devices WOC following  Insomnia -Daughter reports patient struggles regularly with slepp  patient typically uses 2 mg of melatonin with 2 Benadryl  tablets for sleep at night baseline P: Continue nightly melatonin   Best Practice (right click and "Reselect all SmartList Selections" daily)   Diet/type: NPO w/ meds via tube DVT prophylaxis SCD Pressure ulcer(s): present on admission  GI prophylaxis: H2B Lines: N/A Foley:  N/A Code Status:  full code Last date of multidisciplinary goals of care discussion 5/27 spoke w/ daughter at bedside. States to do everything for now but patient would not want to be on vent long term.  Critical care: NA   Sakai Heinle D. Harris, NP-C [facility] Personal contact information can be found on Amion  If no contact or response made please call [phone] 07/24/2023, 8:12 AM

## 2023-07-24 NOTE — Progress Notes (Signed)
 Speech Language Pathology Treatment: Dysphagia  Patient Details Name: Jack White MRN: [id number] DOB: Jan 15, 1941 Today's Date: 07/24/2023 Time: 1050-1101 SLP Time Calculation (min) (ACUTE ONLY): 11 min  Assessment / Plan / Recommendation Clinical Impression  Pt is quite lethargic today. Family member was present and asked for SLP to return a little later to see if he could rest after working with PT. No family member present upon SLP return, but per RN, lethargy is not felt to be medication related. SLP provided a variety of multimodal stimulation, including repositioning, familiar music ([name]), washing face, and performing oral care. Minimal change in alertness and no command following observed. SLP provided tactile input to mouth/lips with minimal parting of lips to allow for passive acceptance of brush for oral care. No awareness of water or applesauce (via spoon or straw), with boluses touching his lips and then spilling down his face with no attempt to self-correct. Per evaluating SLP, if fully alert, could offer small sips of floor stock thin liquids, but at this time he is not alert enough to begin PO diet. SLP will continue to follow.    HPI HPI: Patient is an 83 y.o. male with PMH: possible Parkinson's disease, trigeminal neuralgia, DM. He presented to the ED via EMS as a code stroke after becoming unresponsive at home. He was intubated upon arrival. CT head showed large right holohemispheric acute SDH. Neurosurgery was consulted and patient underwent a  right craniotomy on 07/22/23. He was extubated 5/27 at 1136 to 2L via nasal cannula. He failed Yale swallow screen with RN and SLP swallow evaluation ordered.      SLP Plan  Continue with current plan of care      Recommendations for follow up therapy are one component of a multi-disciplinary discharge planning process, led by the attending physician.  Recommendations may be updated based on patient status, additional functional  criteria and insurance authorization.    Recommendations  Diet recommendations: NPO (small sips from floor stick if fully alert) Medication Administration: Crushed with puree (if alert; otherwise via alternative means) Supervision: Full supervision/cueing for compensatory strategies Compensations: Slow rate;Small sips/bites Postural Changes and/or Swallow Maneuvers: Seated upright 90 degrees                  Oral care QID     Dysphagia, unspecified (R13.10)     Continue with current plan of care     Beth Brooke., M.A. CCC-SLP Acute Rehabilitation Services Office: [phone]  Secure chat preferred   07/24/2023, 11:56 AM

## 2023-07-25 ENCOUNTER — Inpatient Hospital Stay (HOSPITAL_COMMUNITY)

## 2023-07-25 DIAGNOSIS — S065XAA Traumatic subdural hemorrhage with loss of consciousness status unknown, initial encounter: Secondary | ICD-10-CM | POA: Diagnosis not present

## 2023-07-25 DIAGNOSIS — N1831 Chronic kidney disease, stage 3a: Secondary | ICD-10-CM | POA: Diagnosis not present

## 2023-07-25 DIAGNOSIS — I10 Essential (primary) hypertension: Secondary | ICD-10-CM | POA: Diagnosis not present

## 2023-07-25 DIAGNOSIS — E785 Hyperlipidemia, unspecified: Secondary | ICD-10-CM | POA: Diagnosis not present

## 2023-07-25 LAB — GLUCOSE, CAPILLARY
Glucose-Capillary: 242 mg/dL — ABNORMAL HIGH (ref 70–99)
Glucose-Capillary: 253 mg/dL — ABNORMAL HIGH (ref 70–99)
Glucose-Capillary: 177 mg/dL — ABNORMAL HIGH (ref 70–99)
Glucose-Capillary: 174 mg/dL — ABNORMAL HIGH (ref 70–99)
Glucose-Capillary: 169 mg/dL — ABNORMAL HIGH (ref 70–99)
Glucose-Capillary: 218 mg/dL — ABNORMAL HIGH (ref 70–99)

## 2023-07-25 LAB — PHOSPHORUS: Phosphorus: 3 mg/dL (ref 2.5–4.6)

## 2023-07-25 LAB — CBC
HCT: 23.5 % — ABNORMAL LOW (ref 39.0–52.0)
Hemoglobin: 7.3 g/dL — ABNORMAL LOW (ref 13.0–17.0)
MCH: 30.3 pg (ref 26.0–34.0)
MCHC: 31.1 g/dL (ref 30.0–36.0)
MCV: 97.5 fL (ref 80.0–100.0)
Platelets: 117 10*3/uL — ABNORMAL LOW (ref 150–400)
RBC: 2.41 MIL/uL — ABNORMAL LOW (ref 4.22–5.81)
RDW: 14.4 % (ref 11.5–15.5)
WBC: 6.5 10*3/uL (ref 4.0–10.5)
nRBC: 0 % (ref 0.0–0.2)

## 2023-07-25 LAB — BASIC METABOLIC PANEL WITH GFR
Anion gap: 8 (ref 5–15)
BUN: 37 mg/dL — ABNORMAL HIGH (ref 8–23)
CO2: 22 mmol/L (ref 22–32)
Calcium: 8 mg/dL — ABNORMAL LOW (ref 8.9–10.3)
Chloride: 108 mmol/L (ref 98–111)
Creatinine, Ser: 1.45 mg/dL — ABNORMAL HIGH (ref 0.61–1.24)
GFR, Estimated: 48 mL/min — ABNORMAL LOW (ref >60)
Glucose, Bld: 272 mg/dL — ABNORMAL HIGH (ref 70–99)
Potassium: 4.5 mmol/L (ref 3.5–5.1)
Sodium: 138 mmol/L (ref 135–145)

## 2023-07-25 LAB — MAGNESIUM: Magnesium: 2.4 mg/dL (ref 1.7–2.4)

## 2023-07-25 MED ORDER — ACETAMINOPHEN 325 MG PO TABS
650.0000 mg | ORAL_TABLET | Freq: Four times a day (QID) | ORAL | Status: DC
  Administered 2023-07-25 – 2023-08-10 (×62): 650 mg
  Filled 2023-07-25 (×62): qty 2

## 2023-07-25 MED ORDER — MELATONIN 5 MG PO TABS
5.0000 mg | ORAL_TABLET | Freq: Every day | ORAL | Status: DC
  Administered 2023-07-25 – 2023-07-26 (×2): 5 mg
  Filled 2023-07-25 (×2): qty 1

## 2023-07-25 MED ORDER — ACETAMINOPHEN 325 MG PO TABS
650.0000 mg | ORAL_TABLET | Freq: Four times a day (QID) | ORAL | Status: DC
  Administered 2023-07-25: 650 mg via ORAL
  Filled 2023-07-25: qty 2

## 2023-07-25 MED ORDER — INSULIN ASPART 100 UNIT/ML IJ SOLN
0.0000 [IU] | INTRAMUSCULAR | Status: DC
  Administered 2023-07-25: 5 [IU] via SUBCUTANEOUS
  Administered 2023-07-25 – 2023-07-26 (×4): 3 [IU] via SUBCUTANEOUS
  Administered 2023-07-26: 8 [IU] via SUBCUTANEOUS
  Administered 2023-07-26 (×3): 5 [IU] via SUBCUTANEOUS
  Administered 2023-07-26: 3 [IU] via SUBCUTANEOUS
  Administered 2023-07-27 (×3): 5 [IU] via SUBCUTANEOUS
  Administered 2023-07-27: 3 [IU] via SUBCUTANEOUS
  Administered 2023-07-27 – 2023-07-28 (×2): 5 [IU] via SUBCUTANEOUS
  Administered 2023-07-28: 3 [IU] via SUBCUTANEOUS
  Administered 2023-07-28 (×2): 5 [IU] via SUBCUTANEOUS
  Administered 2023-07-28 (×2): 3 [IU] via SUBCUTANEOUS
  Administered 2023-07-29: 5 [IU] via SUBCUTANEOUS
  Administered 2023-07-29 (×2): 3 [IU] via SUBCUTANEOUS
  Administered 2023-07-29: 5 [IU] via SUBCUTANEOUS
  Administered 2023-07-29: 2 [IU] via SUBCUTANEOUS
  Administered 2023-07-29 – 2023-07-30 (×3): 3 [IU] via SUBCUTANEOUS
  Administered 2023-07-30: 2 [IU] via SUBCUTANEOUS
  Administered 2023-07-30: 3 [IU] via SUBCUTANEOUS
  Administered 2023-07-30: 2 [IU] via SUBCUTANEOUS
  Administered 2023-07-31 (×2): 3 [IU] via SUBCUTANEOUS
  Administered 2023-07-31: 2 [IU] via SUBCUTANEOUS
  Administered 2023-07-31: 3 [IU] via SUBCUTANEOUS
  Administered 2023-07-31 – 2023-08-01 (×4): 2 [IU] via SUBCUTANEOUS
  Administered 2023-08-01: 1 [IU] via SUBCUTANEOUS
  Administered 2023-08-02 (×6): 2 [IU] via SUBCUTANEOUS
  Administered 2023-08-03 – 2023-08-04 (×5): 3 [IU] via SUBCUTANEOUS
  Administered 2023-08-04: 2 [IU] via SUBCUTANEOUS
  Administered 2023-08-05: 3 [IU] via SUBCUTANEOUS
  Administered 2023-08-05 – 2023-08-06 (×3): 2 [IU] via SUBCUTANEOUS
  Administered 2023-08-06: 3 [IU] via SUBCUTANEOUS
  Administered 2023-08-07 – 2023-08-08 (×5): 2 [IU] via SUBCUTANEOUS
  Administered 2023-08-08: 3 [IU] via SUBCUTANEOUS
  Administered 2023-08-08 (×2): 2 [IU] via SUBCUTANEOUS

## 2023-07-25 MED ORDER — QUETIAPINE FUMARATE 25 MG PO TABS
25.0000 mg | ORAL_TABLET | Freq: Every day | ORAL | Status: DC
  Administered 2023-07-25 – 2023-07-26 (×2): 25 mg
  Filled 2023-07-25 (×2): qty 1

## 2023-07-25 MED ORDER — INSULIN ASPART 100 UNIT/ML IJ SOLN: 2.0000 [IU] | INTRAMUSCULAR | Status: DC

## 2023-07-25 MED ORDER — INSULIN ASPART 100 UNIT/ML IJ SOLN
2.0000 [IU] | INTRAMUSCULAR | Status: DC
  Administered 2023-07-25 – 2023-07-27 (×14): 2 [IU] via SUBCUTANEOUS

## 2023-07-25 MED ORDER — BETHANECHOL CHLORIDE 10 MG PO TABS
10.0000 mg | ORAL_TABLET | Freq: Three times a day (TID) | ORAL | Status: DC
  Administered 2023-07-25 – 2023-07-28 (×10): 10 mg
  Filled 2023-07-25 (×10): qty 1

## 2023-07-25 MED ORDER — SENNOSIDES 8.8 MG/5ML PO SYRP
5.0000 mL | ORAL_SOLUTION | Freq: Every day | ORAL | Status: DC
  Administered 2023-07-25 – 2023-07-26 (×2): 5 mL via ORAL
  Filled 2023-07-25 (×2): qty 5

## 2023-07-25 MED ORDER — METOPROLOL TARTRATE 25 MG PO TABS
12.5000 mg | ORAL_TABLET | Freq: Two times a day (BID) | ORAL | Status: DC
  Administered 2023-07-25 – 2023-07-29 (×9): 12.5 mg
  Filled 2023-07-25 (×9): qty 1

## 2023-07-25 MED ORDER — LEVETIRACETAM 500 MG PO TABS
500.0000 mg | ORAL_TABLET | Freq: Two times a day (BID) | ORAL | Status: AC
  Administered 2023-07-25 – 2023-07-28 (×8): 500 mg
  Filled 2023-07-25 (×8): qty 1

## 2023-07-25 NOTE — TOC Progression Note (Signed)
 Transition of Care [facility]) - Progression Note    Patient Details  Name: Jack White MRN: [id number] Date of Birth: 10/22/1940  [facility]) CM/SW Contact  Jannice Mends, LCSW Phone Number: 07/25/2023, 9:32 AM  Clinical Narrative:    TOC continuing to follow for disposition needs.    Expected Discharge Plan:  (TBD) Barriers to Discharge: Continued Medical Work up  Expected Discharge Plan and Services   Discharge Planning Services: CM Consult   Living arrangements for the past 2 months: Mobile Home                                       Social Determinants of Health (SDOH) Interventions SDOH Screenings   Food Insecurity: No Food Insecurity (07/22/2023)  Housing: Low Risk  (07/22/2023)  Transportation Needs: No Transportation Needs (07/22/2023)  Utilities: Not At Risk (07/22/2023)  Financial Resource Strain: Patient Declined (02/07/2023)   Received from [facility]  Social Connections: Moderately Integrated (07/24/2023)  Tobacco Use: Low Risk  (07/22/2023)  Recent Concern: Tobacco Use - Medium Risk (07/17/2023)   Received from [facility]    Readmission Risk Interventions     No data to display

## 2023-07-25 NOTE — Progress Notes (Signed)
 NAME:  Jack White, MRN:  [id number], DOB:  10-17-1940, LOS: 3 ADMISSION DATE:  07/21/2023, CONSULTATION DATE:  5/27 REFERRING MD:  Dr. Harless Lien, CHIEF COMPLAINT:  SDH   History of Present Illness:  Patient is an 83 yo M w/ pertinent PMH HTN, HLD, DMT2, parkinson disease, trigeminal neuralgia, CKD 3a presents to [facility] on 5/26 w/ SDH.  On 5/26 patient fell around 9 pm. Daughter states he was initially okay just having HA. About 2 hours later having N/V and became altered. EMS came finding patient w/ left arm/leg weakness. Brought to [facility] as code stroke. On arrival normotensive and afebrile. Cbg 144. CT showing large right holohemispheric acute SDH; subfacline herniation and impending uncal herniation. NSG consulted w/ plans to take to OR for emergent craniotomy. Started on cleviprex  for SBP <140. Given Keppra  1000. Patient intubated for airway protection. PCCM consulted for vent/medical management.  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 Intubated for airway protection. NSG taking to OR. Pccm consulted > extubated by afternoon 5/28 intermittent episodes of agitation/confusion with difficulty sleeping overnight 5/29 difficulty with restlessness overnight, sleeping poorly on a.m. assessment 5/30 repeat CT H   Interim History / Subjective:  CBGs >200s overnight   Finishing up with therapies   Objective    Blood pressure (!) 127/54, pulse (!) 59, temperature 99.4 F  (37.4 C), temperature source Axillary, resp. rate 12, height 6' (1.829 m), weight 104.8 kg, SpO2 96%.        Intake/Output Summary (Last 24 hours) at 07/25/2023 1128 Last data filed at 07/25/2023 1100 Gross per 24 hour  Intake 1279.17 ml  Output 700 ml  Net 579.17 ml   Filed Weights   07/21/23 2300 07/24/23 0500 07/25/23 0702  Weight: 105 kg 104.8 kg 104.8 kg    Examination: General: Acutely and chronically ill elderly M NAD  HEENT: well approximated, R cranial surgical staples intact. Cortrak  Neuro: Drowsy awakens and weakly follows commands  CV: rr s1s2  PULM: Even unlabored on RA  GI: soft ndnt  Extremities: no acute joint deformity  Skin: c/d. Surgical site as above    Resolved problem list  Acute respiratory failure  Assessment and Plan    Traumatic SDH w brain compression S/p crani  Hypoactive delirium Sleep disturbance  Possible parkinsons TN Peripheral neuropathy  P: -for repeat CT H per NSGY 5/30 -keppra  -SBP goal < 140  -holding home ASA  -may be nearing ability to add chem vte ppx pending CT stability  -will change timing of melatonin. Low dose at bedtime seroquel   -sinemet , tegretol , gabapentin  -- home meds. Home cymbalta  on hold until taking PO meds  -PT/OT/SLP  HTN HLD P: -metop, PRN hydral PRN metop -atorvastatin   CKD 3a P: -PRN BMP   DM2 w hyperglycemia  P: - incr insulin  coverage to SSI + novolog  2u q4   BPH P: -urecholine , doxazosin  (home doxazosin  and flomax , but flomax  can't go per tube)  -cont foley for now  L heel ulcer POA -seen by podiatry at [facility] on 5/22 s/p debridement P: -WOC note appreciated  DNR status -DNR, but pre arrest interventions including intubation ok. Confirmed again 5/30   Dispo: Possible txf out of ICU 5/30 Think ultimately would benefit from rehab vs SNF  Best Practice (right click and "Reselect all SmartList Selections" daily)   Diet/type: NPO w/ meds via tube DVT prophylaxis SCD Pressure  ulcer(s): present on admission  GI prophylaxis: H2B Lines: N/A Foley:  N/A Code Status:  limited -- DNR. Intubation ok  Last date of multidisciplinary goals of care discussion 5/29   CCT na   High MDM    Eston Hence MSN, AGACNP-BC [facility] Medicine Amion for pager  07/25/2023, 11:28 AM

## 2023-07-25 NOTE — Progress Notes (Signed)
 Occupational Therapy Treatment Patient Details Name: Jack White MRN: [id number] DOB: 21-Feb-1941 Today's Date: 07/25/2023   History of present illness Patient is an 83 y.o. male who presented to the ED via EMS 07/22/2023 as a code stroke after becoming unresponsive at home. He was intubated upon arrival. CT head showed large right holohemispheric acute SDH. Neurosurgery was consulted and patient underwent a  right craniotomy on 07/22/23. 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   OT comments  Pt supine in bed, daughter at side. Pts daughter reports pt having days and nights mixed up, pt lethargic during session.  Pt able to state his name "Jack White" once upright at EOB, but perseverates on this when asked other orientation questions.  He requires +2 total assist for bed mobility, max assist +2 to stand at EOB; fatigues sitting EOB and demonstrates L lateral lean.  Able to scan towards R with auditory stimuli but not towards L; also tends to use R UE more functionally than L side (? Inattention to L).  Will follow acutely and continue to recommend >3hrs/day inpatient setting.        If plan is discharge home, recommend the following:  Two people to help with walking and/or transfers;Two people to help with bathing/dressing/bathroom;Other (comment)   Equipment Recommendations  Wheelchair (measurements OT);Wheelchair cushion (measurements OT);BSC/3in1    Recommendations for Other Services      Precautions / Restrictions Precautions Precautions: Fall Recall of Precautions/Restrictions: Impaired Restrictions Weight Bearing Restrictions Per Provider Order: No       Mobility Bed Mobility Overal bed mobility: Needs Assistance Bed Mobility: Supine to Sit, Sit to Supine     Supine to sit: +2 for physical assistance, Total assist Sit to supine: +2 for physical assistance, Total assist   General bed mobility comments: Decreased initiation by pt     Transfers Overall transfer level: Needs assistance Equipment used: Rolling walker (2 wheels) Transfers: Sit to/from Stand Sit to Stand: Max assist, +2 physical assistance           General transfer comment: Hand over hand guidance to grip onto handles of walker. Pt daughter blocking feet anteriorly to prevent slide. MaxA + 2 to power up to standing with pt bracing heavily on bed with LE's     Balance Overall balance assessment: Needs assistance Sitting-balance support: Feet supported Sitting balance-Leahy Scale: Poor Sitting balance - Comments: Initially CGA, but regressing to minA with fatigue with left lateral lean Postural control: Left lateral lean                                 ADL either performed or assessed with clinical judgement   ADL Overall ADL's : Needs assistance/impaired     Grooming: Wash/dry face;Total assistance;Sitting Grooming Details (indicate cue type and reason): EOB total assist HOH to wash face                               General ADL Comments: Requires total A either at bed level or sitting EOB to complete BADL tasks.    Extremity/Trunk Assessment Upper Extremity Assessment Upper Extremity Assessment: Right hand dominant;LUE deficits/detail;RUE deficits/detail RUE Deficits / Details: squeeze hand, able to reach up to give high five.  Resisting movement towards face to wash face with hand over hand. RUE Coordination: decreased gross motor;decreased fine motor LUE Deficits / Details:  squeezes hand with increased time and cueing, some inattention? LUE Coordination: decreased fine motor;decreased gross motor            Vision   Vision Assessment?: Vision impaired- to be further tested in functional context Additional Comments: blinks to threat on both sides, able to scan towards R side to auditory stimuli but does not scan past midline towards L side. difficult to assess.   Perception     Praxis      Communication Communication Communication: Impaired Factors Affecting Communication: Hearing impaired   Cognition Arousal: Lethargic Behavior During Therapy: Flat affect Cognition: Cognition impaired, Difficult to assess Difficult to assess due to: Impaired communication, Level of arousal           OT - Cognition Comments: pt able to state name but tends to perseverate on this.  Following some 1 s tep commands with increased time.                 Following commands: Impaired Following commands impaired: Follows one step commands inconsistently      Cueing   Cueing Techniques: Verbal cues  Exercises Exercises: Other exercises Other Exercises Other Exercises: Sitting EOB: lateral lean to R, visual tracking to R/L (pt unable to go past midline towards left)    Shoulder Instructions       General Comments VSS on RA    Pertinent Vitals/ Pain       Pain Assessment Pain Assessment: Faces Faces Pain Scale: Hurts little more Pain Location: head Pain Descriptors / Indicators: Moaning Pain Intervention(s): Limited activity within patient's tolerance, Monitored during session, Repositioned  Home Living                                          Prior Functioning/Environment              Frequency  Min 3X/week        Progress Toward Goals  OT Goals(current goals can now be found in the care plan section)  Progress towards OT goals: Progressing toward goals (slowly)  Acute Rehab OT Goals Patient Stated Goal: none stated OT Goal Formulation: Patient unable to participate in goal setting Time For Goal Achievement: 08/06/23 Potential to Achieve Goals: Fair  Plan      Co-evaluation    PT/OT/SLP Co-Evaluation/Treatment: Yes Reason for Co-Treatment: Complexity of the patient's impairments (multi-system involvement);Necessary to address cognition/behavior during functional activity;For patient/therapist safety;To address functional/ADL  transfers PT goals addressed during session: Mobility/safety with mobility OT goals addressed during session: ADL's and self-care      AM-PAC OT "6 Clicks" Daily Activity     Outcome Measure   Help from another person eating meals?: Total Help from another person taking care of personal grooming?: Total Help from another person toileting, which includes using toliet, bedpan, or urinal?: Total Help from another person bathing (including washing, rinsing, drying)?: Total Help from another person to put on and taking off regular upper body clothing?: Total Help from another person to put on and taking off regular lower body clothing?: Total 6 Click Score: 6    End of Session Equipment Utilized During Treatment: Gait belt;Rolling walker (2 wheels)  OT Visit Diagnosis: Unsteadiness on feet (R26.81);Muscle weakness (generalized) (M62.81);History of falling (Z91.81);Other symptoms and signs involving cognitive function   Activity Tolerance Patient limited by fatigue   Patient Left in bed;with call bell/phone within reach;with  bed alarm set;with family/visitor present   Nurse Communication Mobility status        Time: [phone] OT Time Calculation (min): 30 min  Charges: OT General Charges $OT Visit: 1 Visit OT Treatments $Self Care/Home Management : 8-22 mins  Bary Boss, OT Acute Rehabilitation Services Office [phone] Secure Chat Preferred    [name] 07/25/2023, 1:17 PM

## 2023-07-25 NOTE — Progress Notes (Signed)
  NEUROSURGERY PROGRESS NOTE   Pt seen and examined. No issues overnight.   EXAM: Temp:  [98.9 F (37.2 C)-99.6 F (37.6 C)] 99.4 F (37.4 C) (05/30 0800) Pulse Rate:  [65-98] 67 (05/30 0800) Resp:  [12-20] 16 (05/30 0800) BP: (109-146)/(45-66) 145/54 (05/30 0800) SpO2:  [92 %-96 %] 95 % (05/30 0800) Weight:  [104.8 kg] 104.8 kg (05/30 0702) Intake/Output      05/29 0701 05/30 0700 05/30 0701 05/31 0700   I.V. (mL/kg)     NG/GT 839.2 400   IV Piggyback 5 5   Total Intake(mL/kg) 844.2 (8.1) 405 (3.9)   Urine (mL/kg/hr) 700 (0.3)    Drains     Total Output 700    Net +144.2 +405         Easily arouses Not conversant this morning, mostly moaning to stimulus Perhaps following commands, wiggles toes BLE. Moves BUE purposefully Wound c/d/i  LABS: Lab Results  Component Value Date   CREATININE 1.52 (H) 07/23/2023   BUN 24 (H) 07/23/2023   NA 140 07/23/2023   K 4.9 07/23/2023   CL 109 07/23/2023   CO2 24 07/23/2023   Lab Results  Component Value Date   WBC 5.0 07/22/2023   HGB 8.7 (L) 07/22/2023   HCT 27.2 (L) 07/22/2023   MCV 96.8 07/22/2023   PLT 141 (L) 07/22/2023    IMPRESSION: - 83 y.o. male POD#3 right crani for acute SDH, neurologically stable, but remains somewhat obtunded.  PLAN: - Will get CTH this am - Check CBC/lytes today - If above is stable/unremarkable can consider transfer to stepdown - Appreciate PMR input, will wait on adding recommended meds until above   Augusto Blonder, MD [facility]

## 2023-07-25 NOTE — Progress Notes (Signed)
  Inpatient Rehabilitation Admissions Coordinator   Met at bedside with family friend. Daughter has stepped out. Patient currently not at a level to pursue Cir admit. I asked to have daughter to contact met to discuss rehab venue options, clarify caregiver supports, etc.  Jeannetta Millman, RN, MSN Rehab Admissions Coordinator [phone] 07/25/2023 12:34 PM

## 2023-07-25 NOTE — Progress Notes (Addendum)
 Physical Therapy Treatment Patient Details Name: Jack White MRN: [id number] DOB: Jul 24, 1940 Today's Date: 07/25/2023   History of Present Illness Patient is an 83 y.o. male who presented to the ED via EMS 07/22/2023 as a code stroke after becoming unresponsive at home. He was intubated upon arrival. CT head showed large right holohemispheric acute SDH. Neurosurgery was consulted and patient underwent a  right craniotomy on 07/22/23. 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    PT Comments  Pt seen in conjunction with OT to promote arousal and attention. Pt remains lethargic with intermittent eye opening (pt daughter reports he "has his days and nights mixed up"). Pt able to state name, "Jack White," but otherwise does not follow commands. Pt requiring two person assist for bed mobility. Able to sit up unsupported, but with fatigue, has left lateral lean. Stood from edge of bed to walker with maxA + 2; pt demonstrating retropulsion. Deferred transfer to chair as pt with pending CTH. Will continue to progress as tolerated.     If plan is discharge home, recommend the following: Two people to help with walking and/or transfers;Two people to help with bathing/dressing/bathroom   Can travel by private vehicle        Equipment Recommendations  Other (comment) (defer)    Recommendations for Other Services Rehab consult     Precautions / Restrictions Precautions Precautions: Fall Recall of Precautions/Restrictions: Impaired Restrictions Weight Bearing Restrictions Per Provider Order: No     Mobility  Bed Mobility Overal bed mobility: Needs Assistance Bed Mobility: Supine to Sit, Sit to Supine     Supine to sit: +2 for physical assistance, Total assist Sit to supine: +2 for physical assistance, Total assist   General bed mobility comments: Decreased initiation by pt    Transfers Overall transfer level: Needs assistance Equipment used:  Rolling walker (2 wheels) Transfers: Sit to/from Stand Sit to Stand: Max assist, +2 physical assistance           General transfer comment: Hand over hand guidance to grip onto handles of walker. Pt daughter blocking feet anteriorly to prevent slide. MaxA + 2 to power up to standing with pt bracing heavily on bed with LE's    Ambulation/Gait               General Gait Details: unable   Stairs             Wheelchair Mobility     Tilt Bed    Modified Rankin (Stroke Patients Only) Modified Rankin (Stroke Patients Only) Pre-Morbid Rankin Score: Moderate disability Modified Rankin: Severe disability     Balance Overall balance assessment: Needs assistance Sitting-balance support: Feet supported Sitting balance-Leahy Scale: Poor Sitting balance - Comments: Initially CGA, but regressing to minA with fatigue with left lateral lean                                    Communication Communication Communication: Impaired Factors Affecting Communication: Hearing impaired  Cognition Arousal: Lethargic Behavior During Therapy: Flat affect   PT - Cognitive impairments: Orientation, Awareness, Memory, Attention, Initiation, Sequencing, Problem solving, Safety/Judgement                       PT - Cognition Comments: Pt continues with lethargy and intermittent eye opening to verbal stimulation. Pt able to state name "Jack White," but otherwise no verbalizations, often moaning and  does not follow commands Following commands: Impaired Following commands impaired: Follows one step commands inconsistently    Cueing Cueing Techniques: Verbal cues  Exercises Other Exercises Other Exercises: Sitting EOB: lateral lean to R, visual tracking to R/L (pt unable to go past midline towards left)    General Comments        Pertinent Vitals/Pain Pain Assessment Pain Assessment: Faces Faces Pain Scale: Hurts little more Pain Location: head Pain Descriptors  / Indicators: Moaning Pain Intervention(s): Limited activity within patient's tolerance, Monitored during session, Premedicated before session    Home Living                          Prior Function            PT Goals (current goals can now be found in the care plan section) Acute Rehab PT Goals Patient Stated Goal: pt daughter agreeable to rehab PT Goal Formulation: With patient/family Time For Goal Achievement: 08/06/23 Potential to Achieve Goals: Fair Progress towards PT goals: Progressing toward goals    Frequency    Min 3X/week      PT Plan      Co-evaluation PT/OT/SLP Co-Evaluation/Treatment: Yes Reason for Co-Treatment: Complexity of the patient's impairments (multi-system involvement);Necessary to address cognition/behavior during functional activity;For patient/therapist safety;To address functional/ADL transfers PT goals addressed during session: Mobility/safety with mobility        AM-PAC PT "6 Clicks" Mobility   Outcome Measure  Help needed turning from your back to your side while in a flat bed without using bedrails?: Total Help needed moving from lying on your back to sitting on the side of a flat bed without using bedrails?: Total Help needed moving to and from a bed to a chair (including a wheelchair)?: Total Help needed standing up from a chair using your arms (e.g., wheelchair or bedside chair)?: Total Help needed to walk in hospital room?: Total Help needed climbing 3-5 steps with a railing? : Total 6 Click Score: 6    End of Session Equipment Utilized During Treatment: Gait belt Activity Tolerance: Patient tolerated treatment well Patient left: in bed;with call bell/phone within reach;with bed alarm set;with family/visitor present Nurse Communication: Mobility status PT Visit Diagnosis: Unsteadiness on feet (R26.81);History of falling (Z91.81);Difficulty in walking, not elsewhere classified (R26.2);Pain Pain - part of body:  (head)      Time: 4098-1191 PT Time Calculation (min) (ACUTE ONLY): 30 min  Charges:    $Therapeutic Activity: 8-22 mins PT General Charges $$ ACUTE PT VISIT: 1 Visit                     Verdia Glad, PT, DPT Acute Rehabilitation Services Office [phone]    Claria Crofts 07/25/2023, 11:16 AM

## 2023-07-26 ENCOUNTER — Inpatient Hospital Stay (HOSPITAL_COMMUNITY)

## 2023-07-26 LAB — GLUCOSE, CAPILLARY
Glucose-Capillary: 201 mg/dL — ABNORMAL HIGH (ref 70–99)
Glucose-Capillary: 264 mg/dL — ABNORMAL HIGH (ref 70–99)
Glucose-Capillary: 236 mg/dL — ABNORMAL HIGH (ref 70–99)
Glucose-Capillary: 186 mg/dL — ABNORMAL HIGH (ref 70–99)
Glucose-Capillary: 184 mg/dL — ABNORMAL HIGH (ref 70–99)
Glucose-Capillary: 209 mg/dL — ABNORMAL HIGH (ref 70–99)

## 2023-07-26 LAB — PHOSPHORUS: Phosphorus: 3.2 mg/dL (ref 2.5–4.6)

## 2023-07-26 LAB — MAGNESIUM: Magnesium: 2.5 mg/dL — ABNORMAL HIGH (ref 1.7–2.4)

## 2023-07-26 MED ORDER — SENNOSIDES 8.8 MG/5ML PO SYRP
5.0000 mL | ORAL_SOLUTION | Freq: Every day | ORAL | Status: DC
  Administered 2023-07-27 – 2023-08-09 (×8): 5 mL
  Filled 2023-07-26 (×16): qty 5

## 2023-07-26 NOTE — Progress Notes (Signed)
 NEUROSURGERY PROGRESS NOTE  Doing ok, no acute changes overnight.   Temp:  [96.9 F (36.1 C)-98 F (36.7 C)] 97.2 F (36.2 C) (05/31 0320) Pulse Rate:  [56-107] 97 (05/31 0400) Resp:  [11-20] 20 (05/31 0400) BP: (117-160)/(43-78) 159/77 (05/31 0400) SpO2:  [91 %-97 %] 91 % (05/31 0400) Weight:  [108.9 kg] 108.9 kg (05/31 0330)  Plan: S/p crani for SDH, postop day 4. Still obtunded. Continue supportive care.   Jeannette Mills, NP 07/26/2023 8:28 AM

## 2023-07-26 NOTE — Progress Notes (Signed)
   07/26/23 2030  Assess: MEWS Score  Temp 98.3 F (36.8 C)  BP (!) 149/66  MAP (mmHg) 87  Pulse Rate 100  ECG Heart Rate (!) 101  Resp (!) 23  SpO2 92 %  O2 Device Room Air  Assess: MEWS Score  MEWS Temp 0  MEWS Systolic 0  MEWS Pulse 1  MEWS RR 1  MEWS LOC 2  MEWS Score 4  MEWS Score Color Red  Assess: if the MEWS score is Yellow or Red  Were vital signs accurate and taken at a resting state? Yes  Does the patient meet 2 or more of the SIRS criteria? No  MEWS guidelines implemented  Yes, red  Treat  MEWS Interventions Considered administering scheduled or prn medications/treatments as ordered  Take Vital Signs  Increase Vital Sign Frequency  Red: Q1hr x2, continue Q4hrs until patient remains green for 12hrs  Escalate  MEWS: Escalate Red: Discuss with charge nurse and notify provider. Consider notifying RRT. If remains red for 2 hours consider need for higher level of care  Notify: Charge Nurse/RN  Name of Charge Nurse/RN Notified Gladys, RN  Assess: SIRS CRITERIA  SIRS Temperature  0  SIRS Respirations  1  SIRS Pulse 1  SIRS WBC 0  SIRS Score Sum  2

## 2023-07-26 NOTE — Plan of Care (Signed)
  Problem: Activity: Goal: Ability to tolerate increased activity will improve Outcome: Progressing   Problem: Respiratory: Goal: Ability to maintain a clear airway and adequate ventilation will improve Outcome: Progressing   

## 2023-07-27 ENCOUNTER — Inpatient Hospital Stay (HOSPITAL_COMMUNITY)

## 2023-07-27 DIAGNOSIS — J9601 Acute respiratory failure with hypoxia: Secondary | ICD-10-CM

## 2023-07-27 DIAGNOSIS — A419 Sepsis, unspecified organism: Secondary | ICD-10-CM

## 2023-07-27 DIAGNOSIS — N1831 Chronic kidney disease, stage 3a: Secondary | ICD-10-CM | POA: Diagnosis not present

## 2023-07-27 DIAGNOSIS — S065XAA Traumatic subdural hemorrhage with loss of consciousness status unknown, initial encounter: Secondary | ICD-10-CM | POA: Diagnosis not present

## 2023-07-27 LAB — PHOSPHORUS: Phosphorus: 3.8 mg/dL (ref 2.5–4.6)

## 2023-07-27 LAB — POCT I-STAT 7, (LYTES, BLD GAS, ICA,H+H)
Acid-Base Excess: 2 mmol/L (ref 0.0–2.0)
Bicarbonate: 25.8 mmol/L (ref 20.0–28.0)
Calcium, Ion: 1.19 mmol/L (ref 1.15–1.40)
HCT: 19 % — ABNORMAL LOW (ref 39.0–52.0)
Hemoglobin: 6.5 g/dL — CL (ref 13.0–17.0)
O2 Saturation: 100 %
Patient temperature: 100.4
Potassium: 4.8 mmol/L (ref 3.5–5.1)
Sodium: 141 mmol/L (ref 135–145)
TCO2: 27 mmol/L (ref 22–32)
pCO2 arterial: 40 mmHg (ref 32–48)
pH, Arterial: 7.422 (ref 7.35–7.45)
pO2, Arterial: 305 mmHg — ABNORMAL HIGH (ref 83–108)

## 2023-07-27 LAB — RENAL FUNCTION PANEL
Albumin: 2.3 g/dL — ABNORMAL LOW (ref 3.5–5.0)
Anion gap: 6 (ref 5–15)
BUN: 52 mg/dL — ABNORMAL HIGH (ref 8–23)
CO2: 26 mmol/L (ref 22–32)
Calcium: 7.9 mg/dL — ABNORMAL LOW (ref 8.9–10.3)
Chloride: 107 mmol/L (ref 98–111)
Creatinine, Ser: 1.37 mg/dL — ABNORMAL HIGH (ref 0.61–1.24)
GFR, Estimated: 51 mL/min — ABNORMAL LOW (ref >60)
Glucose, Bld: 212 mg/dL — ABNORMAL HIGH (ref 70–99)
Phosphorus: 3.9 mg/dL (ref 2.5–4.6)
Potassium: 5 mmol/L (ref 3.5–5.1)
Sodium: 139 mmol/L (ref 135–145)

## 2023-07-27 LAB — CBC
HCT: 24.7 % — ABNORMAL LOW (ref 39.0–52.0)
Hemoglobin: 7.8 g/dL — ABNORMAL LOW (ref 13.0–17.0)
MCH: 30.7 pg (ref 26.0–34.0)
MCHC: 31.6 g/dL (ref 30.0–36.0)
MCV: 97.2 fL (ref 80.0–100.0)
Platelets: 154 10*3/uL (ref 150–400)
RBC: 2.54 MIL/uL — ABNORMAL LOW (ref 4.22–5.81)
RDW: 15 % (ref 11.5–15.5)
WBC: 7.2 10*3/uL (ref 4.0–10.5)
nRBC: 0 % (ref 0.0–0.2)

## 2023-07-27 LAB — URINALYSIS, ROUTINE W REFLEX MICROSCOPIC
Bilirubin Urine: NEGATIVE
Glucose, UA: NEGATIVE mg/dL
Hgb urine dipstick: NEGATIVE
Ketones, ur: NEGATIVE mg/dL
Leukocytes,Ua: NEGATIVE
Nitrite: NEGATIVE
Protein, ur: NEGATIVE mg/dL
Specific Gravity, Urine: 1.019 (ref 1.005–1.030)
pH: 5 (ref 5.0–8.0)

## 2023-07-27 LAB — GLUCOSE, CAPILLARY
Glucose-Capillary: 197 mg/dL — ABNORMAL HIGH (ref 70–99)
Glucose-Capillary: 231 mg/dL — ABNORMAL HIGH (ref 70–99)
Glucose-Capillary: 226 mg/dL — ABNORMAL HIGH (ref 70–99)
Glucose-Capillary: 238 mg/dL — ABNORMAL HIGH (ref 70–99)

## 2023-07-27 LAB — PROCALCITONIN: Procalcitonin: 0.18 ng/mL

## 2023-07-27 MED ORDER — IOHEXOL 350 MG/ML SOLN
75.0000 mL | Freq: Once | INTRAVENOUS | Status: AC | PRN
  Administered 2023-07-27: 75 mL via INTRAVENOUS

## 2023-07-27 MED ORDER — ETOMIDATE 2 MG/ML IV SOLN
INTRAVENOUS | Status: AC
  Filled 2023-07-27: qty 20

## 2023-07-27 MED ORDER — FUROSEMIDE 10 MG/ML IJ SOLN
40.0000 mg | Freq: Once | INTRAMUSCULAR | Status: AC
  Administered 2023-07-27: 40 mg via INTRAVENOUS
  Filled 2023-07-27: qty 4

## 2023-07-27 MED ORDER — SODIUM CHLORIDE 0.9 % IV SOLN
3.0000 g | Freq: Four times a day (QID) | INTRAVENOUS | Status: DC
  Administered 2023-07-27 – 2023-08-01 (×19): 3 g via INTRAVENOUS
  Filled 2023-07-27 (×19): qty 8

## 2023-07-27 MED ORDER — ORAL CARE MOUTH RINSE: 15.0000 mL | OROMUCOSAL | Status: DC | PRN

## 2023-07-27 MED ORDER — ORAL CARE MOUTH RINSE
15.0000 mL | OROMUCOSAL | Status: DC
  Administered 2023-07-27 – 2023-08-14 (×67): 15 mL via OROMUCOSAL

## 2023-07-27 MED ORDER — INSULIN GLARGINE-YFGN 100 UNIT/ML ~~LOC~~ SOLN
5.0000 [IU] | Freq: Two times a day (BID) | SUBCUTANEOUS | Status: DC
  Filled 2023-07-27: qty 0.05

## 2023-07-27 MED ORDER — ROCURONIUM BROMIDE 10 MG/ML (PF) SYRINGE
PREFILLED_SYRINGE | INTRAVENOUS | Status: AC
  Filled 2023-07-27: qty 10

## 2023-07-27 MED ORDER — MIDAZOLAM HCL 2 MG/2ML IJ SOLN
INTRAMUSCULAR | Status: AC
  Filled 2023-07-27: qty 2

## 2023-07-27 MED ORDER — INSULIN ASPART 100 UNIT/ML IJ SOLN
4.0000 [IU] | INTRAMUSCULAR | Status: DC
  Administered 2023-07-27 – 2023-07-31 (×21): 4 [IU] via SUBCUTANEOUS

## 2023-07-27 MED ORDER — FENTANYL CITRATE PF 50 MCG/ML IJ SOSY
PREFILLED_SYRINGE | INTRAMUSCULAR | Status: AC
  Filled 2023-07-27: qty 2

## 2023-07-27 NOTE — Progress Notes (Signed)
 RT note. RT called to patient bedside due to ^ WOB. RN was told to place pt. On NRB and will get scan done. Upon arrival patient on NRB sat 98% with labored breathing, very weak cough, congested. Patient NTS at this time small amount of secretions removed.  RT will continue to monitor.

## 2023-07-27 NOTE — Progress Notes (Signed)
 Paged neurosx for slight changes in work of breathing, less arousable (this morning patient did some eye opening and moaning/responding to voice and then only responsive to pain with small moans), increased tachycardia (from 100s to 110s), and decrease in oxygenation on room air (from 94% to 91%). Also called RT and Rapid RN. New orders for CTA and pt to be placed on non-rebreather. Patient was taken to CT by transport, this nurse, and rapid response RN. Patient transferred to 4N ICU shortly after coming back from CT.

## 2023-07-27 NOTE — Progress Notes (Signed)
 Pharmacy Antibiotic Note  Jack White is a 83 y.o. male admitted on 07/21/2023, pharmacy has been consulted for amp-sulbactam dosing for aspiration pneumonia.  Renal function estimated 47 mL/min, stable with Scr ranging 1.3 - 1.5. WBC WNL and afebrile at this time, patient on 10L non-rebreather. CXR 6/1 significant for CHF findings.  Plan: Amp-Sulbactam 3g q6h F/u duration of therapy, oral deescalation Monitor for changes in renal function  Height: 6' (182.9 cm) Weight: 106.1 kg (233 lb 14.5 oz) IBW/kg (Calculated) : 77.6  Temp (24hrs), Avg:99.2 F (37.3 C), Min:98.3 F (36.8 C), Max:100.4 F (38 C)  Recent Labs  Lab 07/21/23 2311 07/22/23 0459 07/22/23 1730 07/23/23 0449 07/23/23 1828 07/25/23 1011  WBC 7.8 5.0  --   --   --  6.5  CREATININE 1.47* 1.32* 1.40* 1.53* 1.52* 1.45*    Estimated Creatinine Clearance: 47.7 mL/min (A) (by C-G formula based on SCr of 1.45 mg/dL (H)).    Allergies  Allergen Reactions   Citrus    Dilaudid [Hydromorphone Hcl] Other (See Comments)    Respiratory arrest   Tomato Other (See Comments)    GI distress, respiratory distress    Antimicrobials this admission: Amp-Sulbactam 6/1 >>  Microbiology results: 5/27 MRSA: neg  Thank you for allowing pharmacy to be a part of this patient's care.  Heddy Liverpool, PharmD PGY2 Critical Care Pharmacy Resident 07/27/2023 6:01 PM

## 2023-07-27 NOTE — Anesthesia Postprocedure Evaluation (Signed)
 Anesthesia Post Note  Patient: Jack White  Procedure(s) Performed: CRANIOTOMY HEMATOMA EVACUATION SUBDURAL (Right)     Patient location during evaluation: ICU Anesthesia Type: General Level of consciousness: sedated and patient remains intubated per anesthesia plan Pain management: pain level controlled Vital Signs Assessment: post-procedure vital signs reviewed and stable Respiratory status: respiratory function stable and patient remains intubated per anesthesia plan Cardiovascular status: stable Anesthetic complications: no   No notable events documented.                  Lealer Marsland

## 2023-07-27 NOTE — Progress Notes (Signed)
 Patient ID: Jack White, male   DOB: 1940/05/25, 83 y.o.   MRN: [id number] BP (!) 142/77 (BP Location: Left Arm)   Pulse (!) 116   Temp (!) 100.4 F (38 C) (Oral)   Resp (!) 27   Ht 6' (1.829 m)   Wt 106.1 kg   SpO2 94%   BMI 31.72 kg/m  Patient localizing with right hand, non verbal, not following commands Tachypneic, poor oxygenation Working very hard to breathe, using accessory muscles. Ordered a Chest CT to look for a Pulmonary Embolus Will consult pulmonary post scan.

## 2023-07-27 NOTE — Consult Note (Signed)
 NAME:  Jack White, MRN:  [id number], DOB:  10-02-1940, LOS: 5 ADMISSION DATE:  07/21/2023, CONSULTATION DATE:  07/27/23 REFERRING MD:  Dr. Michale Age, CHIEF COMPLAINT:  AMS   History of Present Illness:  Pt encephalopathic, therefore HPI obtained from EMR and from pt's daughter at bedside.   76 yoM with PMH as below, significant for HTN, HLD, DMT2, possible parkinsons disease, trigeminal neuralgia, CKD 3a, and BPH, who presented after mechanical fall on 5/27, not on AC or antiplatelets who developed N/V, AMS, with left sided weakness found to have large right SDH with 1.59m right to left MLS.  Admitted to NSGY and taken for emergent right craniotomy.  Was seen by PCCM while in Neuro ICU, extubated 5/27 evening.  ICU stay complicated by hypoactive delirium, cortrak placed for meds/ nutrition.  pt transferred out of ICU 5/30, CTH unchanged 5/31.  Started to develop increased fever curve.  On 6/1 PCCM re-consulted for development on hypoxia, previously on room air with upper airway congestion.  Daughter reports has not been coughing and clearing secretions today.  Concern for PE therefore sent for CTA PE study which is pending official read.    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 Intubated for airway protection. NSG taking to OR. Pccm consulted > extubated by afternoon 5/28 intermittent episodes of agitation/confusion with difficulty sleeping overnight 5/29 difficulty  with restlessness overnight, sleeping poorly on a.m. assessment 5/30 repeat CTH stable, tx out of ICU 6/1 PCCM re-consult for hypoxia  Interim History / Subjective:  S/p return for CTA PE study.  ABG 7.422/ 40/ 305/ 25.8 on NRB since transitioned to [place]  Objective    Blood pressure 127/60, pulse 95, temperature (!) 100.4 F (38 C), temperature source Oral, resp. rate 20, height 6' (1.829 m), weight 106.1 kg, SpO2 100%.        Intake/Output Summary (Last 24 hours) at 07/27/2023 1754 Last data filed at 07/27/2023 1100 Gross per 24 hour  Intake --  Output 900 ml  Net -900 ml   Filed Weights   07/26/23 0330 07/27/23 0030 07/27/23 0357  Weight: 108.9 kg 106.1 kg 106.1 kg   Examination: General:  ill appearing elderly male sitting upright in bed HEENT: MM pink/moist, pupils 3/r, right frontal crani, cortrak Neuro: intermittently moans, will localize with RUE, flaccid LUE, withdrawals in LE CV: rr, NSR PULM:  audible upper airway congestion s/p suctioning with minimal secretions, no gag or able to get to cough, diminished with rhonchi R> L, diminished in bases GI: soft, bs+, NT, purwick- leaking in bed Extremities: warm/dry, +1LE  edema  Skin: no rashes   Labs> none today.  Hgb noted on ABG 6.5 (previously 7.3 on 5/30)  Resolved problem list   Assessment and Plan   Hypoxia - concern for aspiration PNA, ?PE as no VTE ppx given post op/ SDH - CTA PE study pending, no obvious central PE on review - ABG reassuring at this time, hold on intubation for  now.  Remains high risk for intubation.  Daughter confirms is ok with intubation but remains DNR otherwise - cont supplemental O2 for sat goal > 92%, no significant WOB, if worsening consider HHFNC - lasix x 1 - NPO, cortrak for all meds/ EN - aspiration precautions - NTS prn  - ongoing pulm hygiene - abx as below - minimize sedating meds  Sepsis with concern for aspiration PNA - pending UA, CTA PE - blood cultures - unasyn for  now, MRSA PCR neg - stat CBC to rule out progressive anemia but otherwise BP ok  - BB may be masking further tachycardia - tylenol  prn  Traumatic SDH w brain compression S/p crani 5/27 Hypoactive delirium Sleep disturbance  Possible parkinsons TN Peripheral neuropathy  P: - post op per NSGY - no apparent focal changes from previous neuro trend, cont to monitor closely - further CTH per NSGY - AEDs per NSGY  - cont cardidopa-levodopa  - hold seroquel , melatonin  - neuro protective measures  - PT/ OT/ SLP   HTN HLD P: - remains normotensive - cont metoprolol  12.5mg  BID - prn hydralazine  - atorvastatin    CKD 3a BPH P: - renal panel now and trend - strict I/Os - monitor for urinary retention - trend renal indices  - strict I/Os, daily wts - avoid nephrotoxins, renal dose meds, hemodynamic support as above - cardura     DM2 w hyperglycemia  P: - remains > 200.  Cont CBG q4, prn mSSI, increase TF coverage, may need to add semglee      L heel ulcer POA -seen by podiatry at [facility] on 5/22 s/p debridement P: -WOC  Chronic normocytic anemia  - recheck CBC now and send T&S - transfuse for Hgb < 7   DNR status -DNR, but pre arrest interventions including intubation ok. Confirmed again 6/1    Best Practice (right click and "Reselect all SmartList Selections" daily)   Diet/type: tubefeeds DVT prophylaxis SCD Pressure ulcer(s): pressure ulcer assessment deferred  GI prophylaxis: H2B Lines: N/A Foley:  N/A Code Status:  limited Last date of multidisciplinary goals of care discussion [6/1]  Daughter updated on plan of care  Labs   CBC: Recent Labs  Lab 07/21/23 2309 07/21/23 2311 07/22/23 0459 07/25/23 1011 07/27/23 1733  WBC  --  7.8 5.0 6.5  --   NEUTROABS  --  5.6  --   --   --   HGB 10.9* 10.6* 8.7* 7.3* 6.5*  HCT 32.0* 32.9* 27.2* 23.5* 19.0*  MCV  --  97.6 96.8 97.5  --   PLT  --  140* 141* 117*  --     Basic Metabolic Panel: Recent Labs  Lab  07/22/23 0459 07/22/23 1730 07/23/23 0449 07/23/23 1828 07/24/23 0507 07/25/23 0606 07/25/23 1011 07/26/23 0643 07/27/23 0534 07/27/23 1733  NA 136 137 140 140  --   --  138  --   --  141  K 5.4* 4.9 4.9 4.9  --   --  4.5  --   --  4.8  CL 110 110 109 109  --   --  108  --   --   --   CO2 22 21* 21* 24  --   --  22  --   --   --   GLUCOSE 244* 141* 172* 158*  --   --  272*  --   --   --   BUN 24* 23 24* 24*  --   --  37*  --   --   --   CREATININE 1.32* 1.40* 1.53* 1.52*  --   --  1.45*  --   --   --   CALCIUM  7.4* 7.4* 7.4* 8.0*  --   --  8.0*  --   --   --   MG 1.8  --   --  1.8 1.9 2.4  --  2.5*  --   --   PHOS  --   --   --   --  3.4 3.0  --  3.2 3.8  --    GFR: Estimated Creatinine Clearance: 47.7 mL/min (A) (by C-G formula based on SCr of 1.45 mg/dL (H)). Recent Labs  Lab 07/21/23 2311 07/22/23 0459 07/25/23 1011  WBC 7.8 5.0 6.5    Liver Function Tests: Recent Labs  Lab 07/21/23 2311  AST 25  ALT 6  ALKPHOS 117  BILITOT 0.7  PROT 7.0  ALBUMIN 3.0*   No results for input(s): "LIPASE", "AMYLASE" in the last 168 hours. No results for input(s): "AMMONIA" in the last 168 hours.  ABG    Component Value Date/Time   PHART 7.422 07/27/2023 1733   PCO2ART 40.0 07/27/2023 1733   PO2ART 305 (H) 07/27/2023 1733   HCO3 25.8 07/27/2023 1733   TCO2 27 07/27/2023 1733   O2SAT 100 07/27/2023 1733     Coagulation Profile: Recent Labs  Lab 07/21/23 2327  INR 1.2    Cardiac Enzymes: No results for input(s): "CKTOTAL", "CKMB", "CKMBINDEX", "TROPONINI" in the last 168 hours.  HbA1C: Hgb A1c MFr Bld  Date/Time Value Ref Range Status  07/22/2023 04:59 AM 6.5 (H) 4.8 - 5.6 % Final    Comment:    (NOTE) Pre diabetes:          5.7%-6.4%  Diabetes:              >6.4%  Glycemic control for   <7.0% adults with diabetes     CBG: Recent Labs  Lab 07/26/23 2308 07/27/23 0414 07/27/23 0755 07/27/23 1150 07/27/23 1550  GLUCAP 209* 197* 231* 226* 238*     Review of Systems:   unable  Past Medical History:  He,  has a past medical history of Acute cholecystitis (05/21/14), BP (high blood pressure) (10/14/2014), Chronic kidney disease (CKD), stage III (moderate) (HCC) (02/15/2014), Colon perforation (HCC) (05/28/2014), Diabetes mellitus without complication (HCC), [name]'s neuralgia (10/14/2014), Headache, Hypertension, Intractable nausea and vomiting (04/07/2023), and Multiple gastric ulcers (10/14/2014).   Surgical History:   Past Surgical History:  Procedure Laterality Date   COLOSTOMY  05/28/2014   Dr. Luise Saint   COLOSTOMY TAKEDOWN N/A 10/26/2014   Procedure: COLOSTOMY TAKEDOWN;  Surgeon: Rhina Center III, MD;  Location: [facility] ORS;  Service: General;  Laterality: N/A;   CRANIOTOMY Right 07/21/2023   Procedure: CRANIOTOMY HEMATOMA EVACUATION SUBDURAL;  Surgeon: Augusto Blonder, MD;  Location: [facility] OR;  Service: Neurosurgery;  Laterality: Right;   EXPLORATORY LAPAROTOMY  05/28/2014   Dr. Luise Saint   LAPAROSCOPIC CHOLECYSTECTOMY  05/21/2014   Dr. Amalia Badder     Social History:   reports that he has never smoked. He has never used smokeless tobacco. He reports that he does not drink alcohol and does not use drugs.   Family History:  His family history includes Alzheimer's disease in his mother; Diabetes in his mother and sister; Heart disease in his father; Hypertension in his mother.   Allergies Allergies  Allergen Reactions   Citrus    Dilaudid [Hydromorphone Hcl] Other (See  Comments)    Respiratory arrest   Tomato Other (See Comments)    GI distress, respiratory distress     Home Medications  Prior to Admission medications   Medication Sig Start Date End Date Taking? Authorizing Provider  acetaminophen  (TYLENOL ) 500 MG tablet Take 500-1,000 mg by mouth every 6 (six) hours as needed for moderate pain (pain score 4-6).   Yes [provider]  aspirin  81 MG tablet Take 81 mg by mouth daily.   Yes [provider]   atorvastatin  (LIPITOR) 10 MG tablet Take 5 mg by mouth at bedtime. 10/03/14  Yes [provider]  carbamazepine  (CARBATROL ) 200 MG 12 hr capsule Take 200 mg by mouth 3 (three) times daily. 06/26/23  Yes [provider]  carbidopa -levodopa  (SINEMET  IR) 25-100 MG tablet Take 1 tablet by mouth 3 (three) times daily. 11/13/22 11/13/23 Yes [provider]  clonazePAM (KLONOPIN) 0.5 MG tablet Take 0.25 mg by mouth daily as needed for anxiety.   Yes [provider]  diphenhydrAMINE  (BENADRYL ) 25 MG tablet Take 25-50 mg by mouth at bedtime.   Yes [provider]  DULoxetine  (CYMBALTA ) 60 MG capsule Take 60 mg by mouth daily.   Yes [provider]  ergocalciferol (VITAMIN D2) 1.25 MG (50000 UT) capsule Take 50,000 Units by mouth once a week.   Yes [provider]  fluticasone  (FLONASE ) 50 MCG/ACT nasal spray Place 2 sprays into both nostrils daily as needed for allergies. 12/10/22 12/10/23 Yes [provider]  gabapentin  (NEURONTIN ) 300 MG capsule Take 300 mg by mouth at bedtime. 08/17/14  Yes [provider]  hydrocortisone  cream 1 % Apply topically 3 (three) times daily. Patient taking differently: Apply 1 Application topically 3 (three) times daily as needed for itching. 06/26/14  Yes Lauretta Ponto, MD  MELATONIN PO Take 1 tablet by mouth at bedtime.   Yes [provider]  metFORMIN  (GLUCOPHAGE -XR) 500 MG 24 hr tablet Take 500 mg by mouth daily. 04/14/23 04/13/24 Yes [provider]  metoprolol  succinate (TOPROL -XL) 25 MG 24 hr tablet Take 25 mg by mouth daily. 03/24/23 03/23/24 Yes [provider]  montelukast  (SINGULAIR ) 10 MG tablet Take 10 mg by mouth at bedtime.   Yes [provider]  omeprazole (PRILOSEC) 20 MG capsule Take 20 mg by mouth daily. 08/17/14  Yes [provider]  tamsulosin  (FLOMAX ) 0.4 MG CAPS capsule Take 0.4 mg by mouth daily after breakfast. 03/05/23 03/04/24 Yes [provider]  carbamazepine  (TEGRETOL  XR) 200 MG 12 hr tablet Take 1 tablet (200 mg total) by mouth 3 (three) times daily. Patient not taking: Reported on 07/22/2023 07/16/23   Tat, Von Grumbling, DO  doxazosin  (CARDURA ) 4 MG tablet Take 4 mg by mouth daily. Patient not taking: Reported on 07/22/2023 10/17/14   [provider]  ondansetron  (ZOFRAN ) 4 MG tablet Take 1 tablet (4 mg total) by mouth every 6 (six) hours as needed for nausea. Patient not taking: Reported on 07/22/2023 04/08/23   Tiajuana Fluke, MD  ondansetron  (ZOFRAN -ODT) 8 MG disintegrating tablet Take 8 mg by mouth every 8 (eight) hours as needed. Patient not taking: Reported on 07/22/2023 04/29/23   [provider]     Critical care time: 45 mins        Early Glisson, MSN, AG-ACNP-BC [facility] 07/27/2023, 5:54 PM  See Amion for pager If no response to pager , please call [phone] until 7pm After 7:00 pm call Elink  [phone]

## 2023-07-27 NOTE — Plan of Care (Signed)
  Problem: Activity: Goal: Ability to tolerate increased activity will improve Outcome: Progressing   Problem: Respiratory: Goal: Ability to maintain a clear airway and adequate ventilation will improve Outcome: Progressing   Problem: Role Relationship: Goal: Method of communication will improve Outcome: Progressing   Problem: Education: Goal: Knowledge of General Education information will improve Description: Including pain rating scale, medication(s)/side effects and non-pharmacologic comfort measures Outcome: Progressing   Problem: Health Behavior/Discharge Planning: Goal: Ability to manage health-related needs will improve Outcome: Progressing   Problem: Clinical Measurements: Goal: Ability to maintain clinical measurements within normal limits will improve Outcome: Progressing Goal: Will remain free from infection Outcome: Progressing Goal: Diagnostic test results will improve Outcome: Progressing Goal: Respiratory complications will improve Outcome: Progressing Goal: Cardiovascular complication will be avoided Outcome: Progressing   Problem: Activity: Goal: Risk for activity intolerance will decrease Outcome: Progressing   Problem: Nutrition: Goal: Adequate nutrition will be maintained Outcome: Progressing   Problem: Coping: Goal: Level of anxiety will decrease Outcome: Progressing   Problem: Elimination: Goal: Will not experience complications related to bowel motility Outcome: Progressing Goal: Will not experience complications related to urinary retention Outcome: Progressing   Problem: Pain Managment: Goal: General experience of comfort will improve and/or be controlled Outcome: Progressing   Problem: Safety: Goal: Ability to remain free from injury will improve Outcome: Progressing   Problem: Skin Integrity: Goal: Risk for impaired skin integrity will decrease Outcome: Progressing   Problem: Education: Goal: Ability to describe self-care  measures that may prevent or decrease complications (Diabetes Survival Skills Education) will improve Outcome: Progressing Goal: Individualized Educational Video(s) Outcome: Progressing   Problem: Coping: Goal: Ability to adjust to condition or change in health will improve Outcome: Progressing   Problem: Fluid Volume: Goal: Ability to maintain a balanced intake and output will improve Outcome: Progressing   Problem: Health Behavior/Discharge Planning: Goal: Ability to identify and utilize available resources and services will improve Outcome: Progressing Goal: Ability to manage health-related needs will improve Outcome: Progressing   Problem: Metabolic: Goal: Ability to maintain appropriate glucose levels will improve Outcome: Progressing   Problem: Nutritional: Goal: Maintenance of adequate nutrition will improve Outcome: Progressing Goal: Progress toward achieving an optimal weight will improve Outcome: Progressing   Problem: Skin Integrity: Goal: Risk for impaired skin integrity will decrease Outcome: Progressing   Problem: Tissue Perfusion: Goal: Adequacy of tissue perfusion will improve Outcome: Progressing   Problem: Education: Goal: Knowledge of the prescribed therapeutic regimen will improve Outcome: Progressing   Problem: Clinical Measurements: Goal: Usual level of consciousness will be regained or maintained. Outcome: Progressing Goal: Neurologic status will improve Outcome: Progressing Goal: Ability to maintain intracranial pressure will improve Outcome: Progressing   Problem: Skin Integrity: Goal: Demonstration of wound healing without infection will improve Outcome: Progressing   Problem: Safety: Goal: Non-violent Restraint(s) Outcome: Progressing

## 2023-07-27 NOTE — Progress Notes (Signed)
 NEUROSURGERY PROGRESS NOTE Doing better this morning per daughter. Moving more and opening eyes. CT head yesterday was stable. Continue therapy. Has had low grade fevers, will order a chest xrays and UA  Temp:  [98.3 F (36.8 C)-100.6 F (38.1 C)] 99.1 F (37.3 C) (06/01 0758) Pulse Rate:  [66-101] 92 (06/01 0758) Resp:  [16-29] 26 (06/01 0758) BP: (110-161)/(48-92) 161/92 (06/01 0758) SpO2:  [90 %-94 %] 94 % (06/01 0758) Weight:  [106.1 kg] 106.1 kg (06/01 0357)   Jeannette Mills, NP 07/27/2023 8:14 AM

## 2023-07-27 NOTE — Significant Event (Signed)
 Rapid Response Event Note   Reason for Call :  Second set of eyes. MEWS-tachycardic, increased WOB  Initial Focused Assessment:  Patient lying in bed, ill appearing, unable to follow commands. Extremities minimally withdraw to pain, facial wincing/moaning to pain. Pupils 2-3 round and brisk. Lungs rhonchi, diminished bilaterally. Heart tones normal. No gag present. Skin warm/dry. Some edema noted throughout.   127/60 (80) HR 95 RR 20 O2 100% NRB   Interventions/Plan of Care:  ABG EKG CCM consult  Event Summary:  MD Notified: K. Cabbell MD  Call Time: Arrival Time: 1700 End Time: 1735  Ever Hiss, RN

## 2023-07-27 NOTE — Progress Notes (Signed)
 Patients daughter concerned for mental status change to her, patient remains obtunded same as he was during assessment. MD on call man aware and head CT ordered to monitor for changes.  Laiken Sandy, Cammie Cellar, RN

## 2023-07-28 ENCOUNTER — Telehealth: Payer: Self-pay | Admitting: Neurology

## 2023-07-28 DIAGNOSIS — J90 Pleural effusion, not elsewhere classified: Secondary | ICD-10-CM

## 2023-07-28 DIAGNOSIS — S065XAA Traumatic subdural hemorrhage with loss of consciousness status unknown, initial encounter: Secondary | ICD-10-CM | POA: Diagnosis not present

## 2023-07-28 DIAGNOSIS — A419 Sepsis, unspecified organism: Secondary | ICD-10-CM | POA: Diagnosis not present

## 2023-07-28 LAB — GLUCOSE, CAPILLARY
Glucose-Capillary: 154 mg/dL — ABNORMAL HIGH (ref 70–99)
Glucose-Capillary: 182 mg/dL — ABNORMAL HIGH (ref 70–99)
Glucose-Capillary: 184 mg/dL — ABNORMAL HIGH (ref 70–99)
Glucose-Capillary: 187 mg/dL — ABNORMAL HIGH (ref 70–99)
Glucose-Capillary: 217 mg/dL — ABNORMAL HIGH (ref 70–99)
Glucose-Capillary: 218 mg/dL — ABNORMAL HIGH (ref 70–99)
Glucose-Capillary: 243 mg/dL — ABNORMAL HIGH (ref 70–99)

## 2023-07-28 LAB — CBC
HCT: 25.1 % — ABNORMAL LOW (ref 39.0–52.0)
Hemoglobin: 8.1 g/dL — ABNORMAL LOW (ref 13.0–17.0)
MCH: 31.2 pg (ref 26.0–34.0)
MCHC: 32.3 g/dL (ref 30.0–36.0)
MCV: 96.5 fL (ref 80.0–100.0)
Platelets: 160 10*3/uL (ref 150–400)
RBC: 2.6 MIL/uL — ABNORMAL LOW (ref 4.22–5.81)
RDW: 14.9 % (ref 11.5–15.5)
WBC: 7.1 10*3/uL (ref 4.0–10.5)
nRBC: 0 % (ref 0.0–0.2)

## 2023-07-28 LAB — MAGNESIUM: Magnesium: 2.4 mg/dL (ref 1.7–2.4)

## 2023-07-28 LAB — RENAL FUNCTION PANEL
Albumin: 2.3 g/dL — ABNORMAL LOW (ref 3.5–5.0)
Anion gap: 10 (ref 5–15)
BUN: 57 mg/dL — ABNORMAL HIGH (ref 8–23)
CO2: 26 mmol/L (ref 22–32)
Calcium: 8.1 mg/dL — ABNORMAL LOW (ref 8.9–10.3)
Chloride: 105 mmol/L (ref 98–111)
Creatinine, Ser: 1.5 mg/dL — ABNORMAL HIGH (ref 0.61–1.24)
GFR, Estimated: 46 mL/min — ABNORMAL LOW (ref >60)
Glucose, Bld: 220 mg/dL — ABNORMAL HIGH (ref 70–99)
Phosphorus: 4.6 mg/dL (ref 2.5–4.6)
Potassium: 4.6 mmol/L (ref 3.5–5.1)
Sodium: 141 mmol/L (ref 135–145)

## 2023-07-28 MED ORDER — INSULIN GLARGINE-YFGN 100 UNIT/ML ~~LOC~~ SOLN
10.0000 [IU] | Freq: Every day | SUBCUTANEOUS | Status: DC
  Administered 2023-07-28 – 2023-08-08 (×12): 10 [IU] via SUBCUTANEOUS
  Filled 2023-07-28 (×13): qty 0.1

## 2023-07-28 MED ORDER — SODIUM CHLORIDE 3 % IN NEBU
4.0000 mL | INHALATION_SOLUTION | Freq: Two times a day (BID) | RESPIRATORY_TRACT | Status: DC
  Administered 2023-07-28 – 2023-07-29 (×3): 4 mL via RESPIRATORY_TRACT
  Filled 2023-07-28 (×3): qty 4

## 2023-07-28 MED ORDER — ENOXAPARIN SODIUM 40 MG/0.4ML IJ SOSY
40.0000 mg | PREFILLED_SYRINGE | INTRAMUSCULAR | Status: DC
  Administered 2023-07-28 – 2023-08-14 (×18): 40 mg via SUBCUTANEOUS
  Filled 2023-07-28 (×18): qty 0.4

## 2023-07-28 NOTE — Consult Note (Addendum)
 WOC Nurse Consult Note: Reason for Consult: Requested to assess a skin tear on sacrum. Wound type: Open wound by friction. Located on coccyx fold. Not PI. Pressure Injury POA: No Measurement: 2 cm x 0.1 cm x 0.1 cm Wound bed: 100% pink. Skin not flap. Attach edges, viable.  Drainage (amount, consistency, odor) scant amount, no odor. Periwound: intact, no maceration.  Dressing procedure/placement/frequency: Apply sacral foam dressing, change every 3 days or PRN.  WOC team will not plan to follow further.  Please reconsult if further assistance is needed. Thank-you,  Rachel Budds BSN, RN, [employer], WOC  (Pager: [phone])

## 2023-07-28 NOTE — Progress Notes (Signed)
 SLP Cancellation Note  Patient Details Name: DIRON HADDON MRN: [id number] DOB: [birth year]   Cancelled treatment:       Reason Eval/Treat Not Completed: Fatigue/lethargy limiting ability to participate. Per RN, pt not alert enough to participate with SLP today. He recommends f/u on subsequent date.     Beth Brooke., M.A. CCC-SLP Acute Rehabilitation Services Office: [phone]  Secure chat preferred  07/28/2023, 9:31 AM

## 2023-07-28 NOTE — Progress Notes (Signed)
 Inpatient Rehab Admissions Coordinator:   Stopped by to see patient at bedside.  Somnolent.  Does not open eyes to voice/touch.  Did not provide noxious stim.  No family at bedside.  Note therapy updated recommendations to SNF due to slow progress and I think this is appropriate.  We will sign off for CIR at this time.   Loye Rumble, PT, DPT Admissions Coordinator [phone] 07/28/23  1:37 PM

## 2023-07-28 NOTE — Progress Notes (Addendum)
    Providing Compassionate, Quality Care - Together   NEUROSURGERY PROGRESS NOTE     S: CCM on board for evaluation of hypoxia.    O: EXAM:  BP (!) 140/58 (BP Location: Left Arm)   Pulse 93   Temp 99.1 F (37.3 C) (Axillary)   Resp (!) 21   Ht 6' (1.829 m)   Wt 100.7 kg   SpO2 94%   BMI 30.11 kg/m     Drowsy, arouses w/ unintelligible speech PERRL MAEs Incision c/d/I    ASSESSMENT:  83 y.o. with acute R SDH s/p crani, neurologically stable.     PLAN: -Appreciate CCM eval/mgmt -Ok for Lovenox  for dvt ppx from NSGY standpoint.  -Therapies as tolerated -Call w/ questions/concerns.   [place], [employer]

## 2023-07-28 NOTE — Progress Notes (Signed)
 Occupational Therapy Treatment Patient Details Name: Jack White MRN: [id number] DOB: 10/15/1940 Today's Date: 07/28/2023   History of present illness Patient is an 83 y.o. male who presented to the ED via EMS 07/22/2023 as a code stroke after becoming unresponsive at home. He was intubated upon arrival. CT head showed large right holohemispheric acute SDH. Neurosurgery was consulted and patient underwent a  right craniotomy on 07/22/23. 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   OT comments  Pt lethargic on arrival and maintains throughout session today with very limited progression toward goals. Pt responds with moaning to painful stimuli, but no purposive active movement or following commands this session. Eyes intermittently open once EOB with R downward gaze preference and no notable attempts to track. Attempted to improve arousal with music suggested by daughter, but pt with limited response. Pt ultimately total A for all mobility and ADL today. Updated discharge disposition to inpatient rehab <3 hours/day as pt not yet at a level to tolerate more intensive rehab initially recommended. Will continue to follow.       If plan is discharge home, recommend the following:  Two people to help with walking and/or transfers;Two people to help with bathing/dressing/bathroom;Other (comment)   Equipment Recommendations  Wheelchair (measurements OT);Wheelchair cushion (measurements OT);BSC/3in1;Other (comment) (hoyer)    Recommendations for Other Services      Precautions / Restrictions Precautions Precautions: Fall Recall of Precautions/Restrictions: Impaired Restrictions Weight Bearing Restrictions Per Provider Order: No       Mobility Bed Mobility Overal bed mobility: Needs Assistance Bed Mobility: Supine to Sit, Sit to Supine     Supine to sit: Total assist, +2 for physical assistance Sit to supine: Total assist, +2 for physical  assistance   General bed mobility comments: TotalA+ 2 for all aspects    Transfers                         Balance Overall balance assessment: Needs assistance Sitting-balance support: Feet supported Sitting balance-Leahy Scale: Poor Sitting balance - Comments: Requiring mod-maxA                                   ADL either performed or assessed with clinical judgement   ADL Overall ADL's : Needs assistance/impaired     Grooming: Wash/dry face;Total assistance;Sitting                                 General ADL Comments: Requires total A either at bed level or sitting EOB to complete BADL tasks.    Extremity/Trunk Assessment              Occupational psychologist Communication: Impaired Factors Affecting Communication: Hearing impaired   Cognition Arousal: Lethargic Behavior During Therapy: Flat affect Cognition: Cognition impaired, Difficult to assess Difficult to assess due to: Impaired communication, Level of arousal           OT - Cognition Comments: Pt not following any commands this session or performing self-initiated meaningful movement.                 Following commands: Impaired        Cueing   Cueing Techniques: Verbal cues, Tactile cues, Visual cues  Exercises Exercises: Other exercises Other Exercises Other Exercises: Sitting EOB: lateral lean to R and L; Attempted visual tracking but pt with no initiation to track and not following commands to do so. Cervical stretch provided with pt with max forward head posture noting tightness in levators. Providing back extension as well to promote posture. Pt with limited active participation this session    Shoulder Instructions       General Comments 107/43 (61) supine  117/49 (69) eoB  111/52 (70) eob  107/52 (66)  Hr 64, 92%    Pertinent Vitals/ Pain       Pain Assessment Pain Assessment: Faces Faces  Pain Scale: Hurts a little bit Pain Location: Moaning with cervical rotation Pain Descriptors / Indicators: Moaning Pain Intervention(s): Monitored during session  Home Living                                          Prior Functioning/Environment              Frequency  Min 2X/week        Progress Toward Goals  OT Goals(current goals can now be found in the care plan section)  Progress towards OT goals: Not progressing toward goals - comment (lethargic today)  Acute Rehab OT Goals OT Goal Formulation: Patient unable to participate in goal setting Time For Goal Achievement: 08/06/23 Potential to Achieve Goals: Fair ADL Goals Pt Will Perform Eating: with mod assist;sitting Pt Will Perform Grooming: with mod assist;sitting Pt Will Perform Upper Body Bathing: with mod assist;sitting Pt Will Transfer to Toilet: with mod assist;bedside commode;stand pivot transfer Additional ADL Goal #1: Pt will demonstrate improved cognitive function by accurately orienting to place, time, and situation and follow 2 step commands 50% or more of the time with minimal VC from therapist during structured activity. Additional ADL Goal #2: Pt will demonstrate improved bed mobility by transitioning from supine to sitting EOB with Mod A.  Plan      Co-evaluation      Reason for Co-Treatment: Complexity of the patient's impairments (multi-system involvement);Necessary to address cognition/behavior during functional activity;For patient/therapist safety;To address functional/ADL transfers PT goals addressed during session: Mobility/safety with mobility OT goals addressed during session: ADL's and self-care      AM-PAC OT "6 Clicks" Daily Activity     Outcome Measure   Help from another person eating meals?: Total Help from another person taking care of personal grooming?: Total Help from another person toileting, which includes using toliet, bedpan, or urinal?: Total Help  from another person bathing (including washing, rinsing, drying)?: Total Help from another person to put on and taking off regular upper body clothing?: Total Help from another person to put on and taking off regular lower body clothing?: Total 6 Click Score: 6    End of Session Equipment Utilized During Treatment: Gait belt;Rolling walker (2 wheels)  OT Visit Diagnosis: Unsteadiness on feet (R26.81);Muscle weakness (generalized) (M62.81);History of falling (Z91.81);Other symptoms and signs involving cognitive function   Activity Tolerance Patient limited by lethargy   Patient Left in bed;with call bell/phone within reach;with bed alarm set;with family/visitor present   Nurse Communication Mobility status        Time: 4098-1191 OT Time Calculation (min): 34 min  Charges: OT General Charges $OT Visit: 1 Visit OT Treatments $Therapeutic Activity: 8-22 mins  [name], OTD, OTR/L [facility] Acute Rehabilitation Office: [phone]  Rawley Harju D Walton 07/28/2023, 1:20 PM

## 2023-07-28 NOTE — Plan of Care (Signed)
 Overnight, patient maintained airway, titrated Oxygen down to 2 liters. Neuro assessment still remains minimal. Lethargic, will moan out. Does have a cough and gag to deep throat suction. At one point patient did respond to voice when said his name "Jack White" and opened his eyes and said "Huh". Will not follow commands. More movement noted from extremities. Still minimal movement from left lower leg. Patient noted to have small skin tear on sacrum. Daughter concerned about this and is requesting Aquacel or Silver Nitrate dressing to be applied to this. Prolonged QTC noted. Had a formed BM. Did run a slight temp overnight, ice-packs applied and scheduled tylenol  given.  Daughter updated on status, anxious but hopefull.  ICU Status maintained.    Problem: Clinical Measurements: Goal: Respiratory complications will improve Outcome: Progressing   Outcome: Progressing Problem: Clinical Measurements: Goal: Respiratory complications will improve Outcome: Progressing   Problem: Education: Goal: Knowledge of General Education information will improve Description: Including pain rating scale, medication(s)/side effects and non-pharmacologic comfort measures Outcome: Not Progressing   Problem: Activity: Goal: Risk for activity intolerance will decrease Outcome: Not Progressing

## 2023-07-28 NOTE — Telephone Encounter (Signed)
 Pt. Daughter cld fell on memorial day in ICU with brain bleed and he had surgery(craniotomy 50 plus staples in head), she would like to cncl her fathers DAT scan, cld Daughter back and provided [facility] hospital phone to cncl/resched appt for Datscan. He is in [facility] Neuro ICU

## 2023-07-28 NOTE — Progress Notes (Signed)
 Physical Therapy Treatment Patient Details Name: Jack White MRN: [id number] DOB: 21-Oct-1940 Today's Date: 07/28/2023   History of Present Illness Patient is an 83 y.o. male who presented to the ED via EMS 07/22/2023 as a code stroke after becoming unresponsive at home. He was intubated upon arrival. CT head showed large right holohemispheric acute SDH. Neurosurgery was consulted and patient underwent a  right craniotomy on 07/22/23. 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    PT Comments  Unfortunately, pt does not demonstrate meaningful progress towards physical therapy goals due to ongoing lethargy. Pt will react to painful stimuli, but otherwise does not follow one step commands or open eyes to stimulation. Performed ROM to BLE's in supine and then assisted to edge of bed to promote upright and attempt to elicit arousal. BP supine 107/43 (61), sitting EOB 117/49 (69), sitting EOB x 2 minutes 111/52 (70); SpO2 92% on RA, HR 64. Will continue to follow acutely to progress as tolerated.    If plan is discharge home, recommend the following: Two people to help with walking and/or transfers;Two people to help with bathing/dressing/bathroom   Can travel by private vehicle     No  Equipment Recommendations  Other (comment) (defer)    Recommendations for Other Services       Precautions / Restrictions Precautions Precautions: Fall Recall of Precautions/Restrictions: Impaired Restrictions Weight Bearing Restrictions Per Provider Order: No     Mobility  Bed Mobility Overal bed mobility: Needs Assistance Bed Mobility: Supine to Sit, Sit to Supine     Supine to sit: Total assist, +2 for physical assistance Sit to supine: Total assist, +2 for physical assistance   General bed mobility comments: TotalA+ 2 for all aspects    Transfers                        Ambulation/Gait                   Stairs              Wheelchair Mobility     Tilt Bed    Modified Rankin (Stroke Patients Only)       Balance Overall balance assessment: Needs assistance Sitting-balance support: Feet supported Sitting balance-Leahy Scale: Poor Sitting balance - Comments: Requiring mod-maxA                                    Communication Communication Communication: Impaired Factors Affecting Communication: Hearing impaired  Cognition Arousal: Lethargic Behavior During Therapy: Flat affect   PT - Cognitive impairments: Difficult to assess Difficult to assess due to: Level of arousal                     PT - Cognition Comments: Pt not following any commands or visually tracking Following commands: Impaired      Cueing Cueing Techniques: Verbal cues, Tactile cues, Visual cues  Exercises General Exercises - Lower Extremity Ankle Circles/Pumps: PROM, Both, 5 reps, Supine Heel Slides: PROM, Both, 5 reps, Supine    General Comments        Pertinent Vitals/Pain Pain Assessment Pain Assessment: Faces Faces Pain Scale: Hurts a little bit Pain Location: Moaning with cervical rotation Pain Descriptors / Indicators: Moaning Pain Intervention(s): Monitored during session, Repositioned    Home Living  Prior Function            PT Goals (current goals can now be found in the care plan section) Acute Rehab PT Goals Patient Stated Goal: pt daughter agreeable to rehab PT Goal Formulation: With patient/family Time For Goal Achievement: 08/06/23 Potential to Achieve Goals: Fair Progress towards PT goals: Not progressing toward goals - comment (lethargy)    Frequency    Min 2X/week      PT Plan      Co-evaluation PT/OT/SLP Co-Evaluation/Treatment: Yes Reason for Co-Treatment: Complexity of the patient's impairments (multi-system involvement);Necessary to address cognition/behavior during functional activity;For patient/therapist  safety;To address functional/ADL transfers PT goals addressed during session: Mobility/safety with mobility        AM-PAC PT "6 Clicks" Mobility   Outcome Measure  Help needed turning from your back to your side while in a flat bed without using bedrails?: Total Help needed moving from lying on your back to sitting on the side of a flat bed without using bedrails?: Total Help needed moving to and from a bed to a chair (including a wheelchair)?: Total Help needed standing up from a chair using your arms (e.g., wheelchair or bedside chair)?: Total Help needed to walk in hospital room?: Total Help needed climbing 3-5 steps with a railing? : Total 6 Click Score: 6    End of Session Equipment Utilized During Treatment: Oxygen Activity Tolerance: Patient limited by lethargy Patient left: in bed;with call bell/phone within reach;with bed alarm set;with family/visitor present Nurse Communication: Mobility status PT Visit Diagnosis: Unsteadiness on feet (R26.81);History of falling (Z91.81);Difficulty in walking, not elsewhere classified (R26.2);Pain     Time: 0950-1019 PT Time Calculation (min) (ACUTE ONLY): 29 min  Charges:    $Therapeutic Activity: 8-22 mins PT General Charges $$ ACUTE PT VISIT: 1 Visit                     Verdia Glad, PT, DPT Acute Rehabilitation Services Office [phone]    Claria Crofts 07/28/2023, 12:56 PM

## 2023-07-28 NOTE — Progress Notes (Signed)
 NAME:  Jack White, MRN:  [id number], DOB:  [birth year], LOS: 6a ADMISSION DATE:  07/21/2023, CONSULTATION DATE:  07/27/2023 REFERRING MD: Michale Age - NSGY, CHIEF COMPLAINT:  AMS   History of Present Illness:  Pt encephalopathic, therefore HPI obtained from EMR and from pt's daughter at bedside.   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  Admitted to NSGY and taken for emergent right craniotomy.  Was seen by PCCM while in Neuro ICU, extubated 5/27 evening.  ICU stay complicated by hypoactive delirium, cortrak placed for meds/ nutrition.  pt transferred out of ICU 5/30, CTH unchanged 5/31.  Started to develop increased fever curve.  On 6/1 PCCM re-consulted for development on hypoxia, previously on room air with upper airway congestion.  Daughter reports has not been coughing and clearing secretions today.  Concern for PE therefore sent for CTA PE study which is pending official read.    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 Intubated for airway protection. NSG taking to OR. Pccm consulted > extubated by afternoon 5/28 intermittent episodes of agitation/confusion with difficulty sleeping overnight 5/29  difficulty with restlessness overnight, sleeping poorly on a.m. assessment 5/30 repeat CTH stable, tx out of ICU 6/1 PCCM re-consult for hypoxia  Interim History / Subjective:  Overnight, concern for airway watch - considered intubation Mental status and airway protection improved significantly today Patient will now open eyes/track and follow some commands intermittently Daughter, Genevia Kern, at bedside; concerned about skin, WOC consult placed Added HTS nebs + Chest PT for thick secretions, little improvement post-NTS/oral suctioning PT/OT following  Objective:    Blood pressure (!) 130/53, pulse 86, temperature 97.7 F (36.5 C), temperature source Axillary, resp. rate 19, height 6' (1.829 m), weight 100.7 kg, SpO2 97%.        Intake/Output Summary (Last 24 hours) at 07/28/2023 0755 Last data filed at 07/28/2023 0700 Gross per 24 hour  Intake 3421.25 ml  Output 2775 ml  Net 646.25 ml   Filed Weights   07/27/23 0030 07/27/23 0357 07/28/23 0500  Weight: 106.1 kg 106.1 kg 100.7 kg   Physical Examination: General: Acutely ill-appearing elderly man in NAD. Appears mildly uncomfortable. HEENT: Normocephalic. R crani incision c/d/i with scant dried blood, staple closure. Anicteric sclera, PERRL 3mm, dry oral mucous membranes. Neuro: Awake, drowsy/doses off quickly. Responds to verbal stimuli, tracks examiner. Following commands intermittently. Moves all 4 extremities spontaneously, more readily moves BUE with noxious stimuli.+Corneal, +Cough, and +Gag  CV: RRR, no m/g/r. PULM: Breathing even and mildly labored on 2LNC. Occasional gurgling, clearing with cough. Lung fields coarse bilaterally, R > L. GI: Soft, nontender, nondistended. Normoactive bowel sounds. Extremities: Bilateral symmetric 1+ LE edema noted. Skin: Warm/dry, scattered ecchymosis.  Resolved problem List:  Assessment and Plan:   Hypoxia, improved Concern for aspiration PNA Small bilateral pleural effusions Concern  for aspiration PNA, CT Chest PE Protocol negative for PE, +small bilateral pleural effusion (L > R). - Supplemental O2 support for goal sat > 92% - Consider HHFNC if status worsening - Diuresis as tolerated, monitor I&Os/renal indices - Wean FiO2 for O2 sat > 90% - Pulmonary hygiene - NTS PRN, Chest PT - Hypertonic saline nebs - Continue Unasyn for aspiration coverage - Aspiration precautions  Sepsis with concern for aspiration PNA - Trend WBC, fever curve - F/u Cx data - Continue broad-spectrum antibiotics (Unasyn)  Traumatic SDH w brain compression S/p crani 5/27 Hypoactive delirium Sleep disturbance  Possible parkinsons Peripheral neuropathy  - Postoperative management per NSGY - Repeat imaging per NSGY - AEDs as ordered - Continue Sinemet  - Hold Seroquel , melatonin - Neuroprotective measures: HOB > 30 degrees, normoglycemia, normothermia, electrolytes WNL - PT/OT/SLP  HTN HLD Remains normotensive. - Continue metoprolol  - Hydralazine  PRN - Continue statin   CKD stage 3a BPH - Trend BMP - Replete electrolytes as indicated - Monitor I&Os - Avoid nephrotoxic agents as able - Ensure adequate renal perfusion   DM2 w hyperglycemia  - Basal Semglee 10U daily - TF coverage - SSI - CBGs Q4H - Goal CBG 140-180  At risk for malnutrition - RD/Nutrition consult, appreciate recs - Cortrak in place - Continue TF  L heel ulcer POA Seen by podiatry at [facility] on 5/22 s/p debridement - WOC consult for skin evaluation  Chronic normocytic anemia  - Trend H&H - Monitor for signs of active bleeding - Transfuse for Hgb < 7.0 or hemodynamically significant bleeding  DNR status - DNR, but pre arrest interventions including intubation ok. Confirmed again 6/1   Best Practice: (right click and "Reselect all SmartList Selections" daily)   Diet/type: tubefeeds DVT prophylaxis SCD Pressure ulcer(s): pressure ulcer assessment deferred  GI prophylaxis: H2B Lines: N/A Foley:   N/A Code Status:  limited Last date of multidisciplinary goals of care discussion [6/1]  Critical care time:    The patient is critically ill with multiple organ system failure and requires high complexity decision making for assessment and support, frequent evaluation and titration of therapies, advanced monitoring, review of radiographic studies and interpretation of complex data.   Critical Care Time devoted to patient care services, exclusive of separately billable procedures, described in this note is 38 minutes.  Star East, PA-C [facility] 07/28/23 7:55 AM  Please see [email or web address] for pager details.  From 7A-7P if no response, please call [phone] After hours, please call ELink [phone]

## 2023-07-29 ENCOUNTER — Inpatient Hospital Stay (HOSPITAL_COMMUNITY)

## 2023-07-29 DIAGNOSIS — A419 Sepsis, unspecified organism: Secondary | ICD-10-CM | POA: Diagnosis not present

## 2023-07-29 DIAGNOSIS — S065XAA Traumatic subdural hemorrhage with loss of consciousness status unknown, initial encounter: Secondary | ICD-10-CM | POA: Diagnosis not present

## 2023-07-29 DIAGNOSIS — J90 Pleural effusion, not elsewhere classified: Secondary | ICD-10-CM | POA: Diagnosis not present

## 2023-07-29 LAB — CBC
HCT: 25.7 % — ABNORMAL LOW (ref 39.0–52.0)
Hemoglobin: 8 g/dL — ABNORMAL LOW (ref 13.0–17.0)
MCH: 30.8 pg (ref 26.0–34.0)
MCHC: 31.1 g/dL (ref 30.0–36.0)
MCV: 98.8 fL (ref 80.0–100.0)
Platelets: 180 10*3/uL (ref 150–400)
RBC: 2.6 MIL/uL — ABNORMAL LOW (ref 4.22–5.81)
RDW: 15.1 % (ref 11.5–15.5)
WBC: 7.4 10*3/uL (ref 4.0–10.5)
nRBC: 0 % (ref 0.0–0.2)

## 2023-07-29 LAB — RENAL FUNCTION PANEL
Albumin: 2.3 g/dL — ABNORMAL LOW (ref 3.5–5.0)
Anion gap: 7 (ref 5–15)
BUN: 68 mg/dL — ABNORMAL HIGH (ref 8–23)
CO2: 27 mmol/L (ref 22–32)
Calcium: 7.7 mg/dL — ABNORMAL LOW (ref 8.9–10.3)
Chloride: 109 mmol/L (ref 98–111)
Creatinine, Ser: 1.8 mg/dL — ABNORMAL HIGH (ref 0.61–1.24)
GFR, Estimated: 37 mL/min — ABNORMAL LOW (ref >60)
Glucose, Bld: 201 mg/dL — ABNORMAL HIGH (ref 70–99)
Phosphorus: 5 mg/dL — ABNORMAL HIGH (ref 2.5–4.6)
Potassium: 4.8 mmol/L (ref 3.5–5.1)
Sodium: 143 mmol/L (ref 135–145)

## 2023-07-29 LAB — GLUCOSE, CAPILLARY
Glucose-Capillary: 143 mg/dL — ABNORMAL HIGH (ref 70–99)
Glucose-Capillary: 168 mg/dL — ABNORMAL HIGH (ref 70–99)
Glucose-Capillary: 183 mg/dL — ABNORMAL HIGH (ref 70–99)
Glucose-Capillary: 186 mg/dL — ABNORMAL HIGH (ref 70–99)
Glucose-Capillary: 201 mg/dL — ABNORMAL HIGH (ref 70–99)
Glucose-Capillary: 221 mg/dL — ABNORMAL HIGH (ref 70–99)

## 2023-07-29 LAB — MAGNESIUM: Magnesium: 2.9 mg/dL — ABNORMAL HIGH (ref 1.7–2.4)

## 2023-07-29 MED ORDER — ALBUMIN HUMAN 25 % IV SOLN
25.0000 g | Freq: Once | INTRAVENOUS | Status: AC
  Administered 2023-07-29: 25 g via INTRAVENOUS
  Filled 2023-07-29: qty 100

## 2023-07-29 MED ORDER — SODIUM CHLORIDE 3 % IN NEBU
4.0000 mL | INHALATION_SOLUTION | Freq: Three times a day (TID) | RESPIRATORY_TRACT | Status: AC
  Administered 2023-07-29 – 2023-07-31 (×6): 4 mL via RESPIRATORY_TRACT
  Filled 2023-07-29 (×6): qty 4

## 2023-07-29 MED ORDER — FUROSEMIDE 10 MG/ML IJ SOLN
40.0000 mg | Freq: Once | INTRAMUSCULAR | Status: AC
  Administered 2023-07-29: 40 mg via INTRAVENOUS
  Filled 2023-07-29: qty 4

## 2023-07-29 NOTE — Progress Notes (Signed)
 Nutrition Follow-up  DOCUMENTATION CODES:   Non-severe (moderate) malnutrition in context of chronic illness  INTERVENTION:  Continue tube feeding via Cortrak tube: Osmolite 1.5 at 50 ml/hr (1200 ml per day) ProSource TF20 60 ml BID Provides 1960 kcal, 115 gm protein, 912 ml free water daily  NUTRITION DIAGNOSIS:   Moderate Malnutrition related to chronic illness as evidenced by moderate fat depletion, moderate muscle depletion. - Ongoing, being addressed via TF   GOAL:   Patient will meet greater than or equal to 90% of their needs - Met via TF  MONITOR:   TF tolerance  REASON FOR ASSESSMENT:   Consult Enteral/tube feeding initiation and management  ASSESSMENT:   Pt with PMH of HTN, HLD, DM, Parkinson's dz, trigeminal neuralgia, CKD 3, and L heel ulcer seen by podiatry at [facility] s/p debridement 5/22 now admitted 5/26 after a fall with large R acute SDH.   5/27 - s/p R crani for evacuation of SDH 5/28 - s/p cortrak placement; tip gastric 5/30 - transferred out of ICU 6/01 - transferred back to ICU 6/03 - SLP eval, recommend NPO  Pt laying in bed, no family at bedside. Pt does not acknowledge RD voice or touch. Pt TF infusing at goal via Cortrak. RN off unit at time of RD visit.   Admission Weight: 105 kg (5/26) Current Weight: 104.3 kg (6/03)  Nutrition Related Medications: Colace, Pepcid , Lasix, NovoLog  0-15 units q4h + 4 units q4h, Semglee 10 units daily, Miralax , Senokot, IV antibiotics  Labs: Sodium 143, Potassium 4.8, BUN 68, Creatinine 1.80, Phosphorus 5.0, Magnesium  2.9, GFR 37  CBG: 154-243 mg/dL x 24 hrs   UOP: 6045 mL x 24 hrs   Diet Order:   Diet Order             Diet NPO time specified  Diet effective now                  EDUCATION NEEDS:   Not appropriate for education at this time  Skin:  Skin Assessment: Skin Integrity Issues: Skin Integrity Issues:: Other (Comment) Other: L heel wound followed by [facility]  Last BM:  6/3 - Type  6  Height:  Ht Readings from Last 1 Encounters:  07/21/23 6' (1.829 m)   Weight:  Wt Readings from Last 1 Encounters:  07/29/23 104.3 kg   BMI:  Body mass index is 31.19 kg/m.  Estimated Nutritional Needs:  Kcal:  1900-2100 Protein:  110-125 grams Fluid:  >1.9 L/day   Doneta Furbish RD, LDN Clinical Dietitian

## 2023-07-29 NOTE — Progress Notes (Signed)
 Speech Language Pathology Treatment: Dysphagia  Patient Details Name: Jack White MRN: [id number] DOB: 1940/10/30 Today's Date: 07/29/2023 Time: 4098-1191 SLP Time Calculation (min) (ACUTE ONLY): 20 min  Assessment / Plan / Recommendation Clinical Impression  Pt had been a little more alert this morning per RN and daughter at bedside but now lethargic again. SLP and daughter provided multimodal stimulation to try to increase level of alertness. Pt groaned but did not follow any commands. He did not make any attempt to take ice chips when presented to lips. Recommend that he remain NPO pending further improvements in mentation.    HPI HPI: Patient is an 83 y.o. male with PMH: possible Parkinson's disease, trigeminal neuralgia, DM. He presented to the ED via EMS as a code stroke after becoming unresponsive at home. He was intubated upon arrival. CT head showed large right holohemispheric acute SDH. Neurosurgery was consulted and patient underwent a  right craniotomy on 07/22/23. He was extubated 5/27 at 1136 to 2L via nasal cannula. He failed [facility] swallow screen with RN and SLP swallow evaluation ordered.      SLP Plan  Continue with current plan of care      Recommendations for follow up therapy are one component of a multi-disciplinary discharge planning process, led by the attending physician.  Recommendations may be updated based on patient status, additional functional criteria and insurance authorization.    Recommendations  Diet recommendations: NPO Medication Administration: Via alternative means                  Oral care QID   Frequent or constant Supervision/Assistance Dysphagia, unspecified (R13.10)     Continue with current plan of care     Jack White., M.A. CCC-SLP Acute Rehabilitation Services Office: [phone]  Secure chat preferred   07/29/2023, 1:16 PM

## 2023-07-29 NOTE — TOC Progression Note (Signed)
 Transition of Care [facility]) - Progression Note    Patient Details  Name: Jack White MRN: [id number] Date of Birth: 1940-05-23  Transition of Care [facility]) CM/SW Contact  Jannice Mends, LCSW Phone Number: 07/29/2023, 11:31 AM  Clinical Narrative:    CSW continuing to follow for medical readiness for SNF workup. Patient remains with Cortrak.    Expected Discharge Plan:  (TBD) Barriers to Discharge: Continued Medical Work up  Expected Discharge Plan and Services   Discharge Planning Services: CM Consult   Living arrangements for the past 2 months: Mobile Home                                       Social Determinants of Health (SDOH) Interventions SDOH Screenings   Food Insecurity: No Food Insecurity (07/22/2023)  Housing: Low Risk  (07/22/2023)  Transportation Needs: No Transportation Needs (07/22/2023)  Utilities: Not At Risk (07/22/2023)  Financial Resource Strain: Patient Declined (02/07/2023)   Received from [facility]  Social Connections: Moderately Integrated (07/24/2023)  Tobacco Use: Low Risk  (07/22/2023)  Recent Concern: Tobacco Use - Medium Risk (07/17/2023)   Received from [facility]    Readmission Risk Interventions     No data to display

## 2023-07-29 NOTE — Progress Notes (Signed)
 NAME:  Jack White, MRN:  [id number], DOB:  06/25/1940, LOS: 7a ADMISSION DATE:  07/21/2023, CONSULTATION DATE:  07/27/2023 REFERRING MD: Michale Age - NSGY, CHIEF COMPLAINT:  AMS   History of Present Illness:  Pt encephalopathic, therefore HPI obtained from EMR and from pt's daughter at bedside.   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  Admitted to NSGY and taken for emergent right craniotomy.  Was seen by PCCM while in Neuro ICU, extubated 5/27 evening.  ICU stay complicated by hypoactive delirium, cortrak placed for meds/ nutrition.  pt transferred out of ICU 5/30, CTH unchanged 5/31.  Started to develop increased fever curve.  On 6/1 PCCM re-consulted for development on hypoxia, previously on room air with upper airway congestion.  Daughter reports has not been coughing and clearing secretions today.  Concern for PE therefore sent for CTA PE study which is pending official read.    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 Intubated for airway protection. NSG taking to OR. Pccm consulted > extubated by afternoon 5/28 intermittent episodes of agitation/confusion with difficulty sleeping overnight 5/29  difficulty with restlessness overnight, sleeping poorly on a.m. assessment 5/30 repeat CTH stable, tx out of ICU 6/1 PCCM re-consult for hypoxia  Interim History / Subjective:  No significant events overnight Improved alertness this AM, following commands for RN (thumbs up, wiggling toes, stating name) Fatigues quickly, but more awake than prior CXR with low lung volumes, +bronchovascular crowding, diffuse interstitial/patchy opacities (edema vs. Multifocal PNA) BPs borderline, MAPs low-mid 70s Planning for albumin 25%, gentle diuresis with Lasix x 1 and additional albumin Daughter, Genevia Kern, at bedside (updated)  Objective:    Blood pressure 126/65, pulse 80, temperature 99.1 F (37.3 C), temperature source Axillary, resp. rate 16, height 6' (1.829 m), weight 104.3 kg, SpO2 99%.        Intake/Output Summary (Last 24 hours) at 07/29/2023 0733 Last data filed at 07/29/2023 0700 Gross per 24 hour  Intake 878.73 ml  Output 1150 ml  Net -271.27 ml   Filed Weights   07/27/23 0357 07/28/23 0500 07/29/23 0500  Weight: 106.1 kg 100.7 kg 104.3 kg   Physical Examination: General: Acutely ill-appearing elderly man in NAD. Appears comfortable. HEENT: [place]/AT, anicteric sclera, PERRL 3mm, moist mucous membranes. Cortrak in place. Neuro: Awake, intermittently responsive versus drowsy. Responds to verbal stimuli. Following commands intermittently. Moves all 4 extremities spontaneously. Noted weakness of LUE/LLE versus R. +Cough and +Gag  CV: RRR, no m/g/r. PULM: Breathing even and unlabored on 2LNC. Lung fields coarse bilaterally, R > L but improved from prior exam. Diminished at bilateral bases. GI: Soft, nontender, nondistended. Normoactive bowel sounds. Extremities: Bilateral symmetric 1+ UE/LE edema noted. Skin: Warm/dry, scattered ecchymosis of bilateral  forearms.  Resolved problem List:    Assessment and Plan:   Hypoxia, improved Concern for aspiration PNA Small bilateral pleural  effusions Concern for aspiration PNA, CT Chest PE Protocol negative for PE, +small bilateral pleural effusion (L > R). - Supplemental O2 support for goal sat > 92% - Consider HHFNC if status worsening, not a great BiPAP candidate in the setting of ?aspiration - Diuresis as tolerated, Lasix 40mg  IV x 1 today post-albumin - Monitor I&Os/renal indices - Pulmonary hygiene including HTS nebs TID, chest PT, IS/flutter (when able), NTS PRN - Unasyn x 5-day course for aspiration coverage -  Aspiration precautions - Intermittent CXR  Sepsis with concern for aspiration PNA - Trend WBC, fever curve - F/u Cx data - Continue broad-spectrum antibiotics (Unasyn)  Traumatic SDH w brain compression S/p crani 5/27 Hypoactive delirium Sleep disturbance  Possible parkinsons Peripheral neuropathy  - Postoperative management per NSGY - Repeat imaging per Neuro - AEDs as ordered (Tegretol ) - Continue Sinemet  - Hold Seroquel /melatonin - Neuroprotective measures: HOB > 30 degrees, normoglycemia, normothermia, electrolytes WNL - PT/OT/SLP  HTN HLD Remains normotensive. - Continue metoprolol , hold parameters placed - Hydralazine  PRN - Continue statin   CKD stage 3a BPH - Trend BMP - Albumin 25% x 2 doses today, 6/3 with Lasix 40mg  IV x 1 - Replete electrolytes as indicated - Monitor I&Os - Avoid nephrotoxic agents as able - Ensure adequate renal perfusion   DM2 w hyperglycemia  - Basal Semglee 10U daily - TF coverage + SSI - Goal CBG 140-180  At risk for malnutrition - RD/Nutrition consult, appreciate recommendations - Cortrak in place - Continue TF for now  L heel ulcer POA Seen by podiatry at [facility] on 5/22 s/p debridement - WOC consulted, appreciate recs - Pressure-relieving devices as indicated  Chronic normocytic anemia  - Trend H&H - Monitor for signs of active bleeding - Transfuse for Hgb < 7.0 or hemodynamically significant bleeding  DNR status - DNR, but pre arrest  interventions including intubation ok. Confirmed again 6/1   Best Practice: (right click and "Reselect all SmartList Selections" daily)   Diet/type: tubefeeds DVT prophylaxis SCD Pressure ulcer(s): pressure ulcer assessment deferred  GI prophylaxis: H2B Lines: N/A Foley:  N/A Code Status:  limited Last date of multidisciplinary goals of care discussion [6/1]  Signature:   Genoveva Kidney [facility] 07/29/23 7:33 AM  Please see [email or web address] for pager details.  From 7A-7P if no response, please call [phone] After hours, please call ELink [phone]

## 2023-07-30 DIAGNOSIS — E878 Other disorders of electrolyte and fluid balance, not elsewhere classified: Secondary | ICD-10-CM | POA: Diagnosis not present

## 2023-07-30 DIAGNOSIS — J90 Pleural effusion, not elsewhere classified: Secondary | ICD-10-CM | POA: Diagnosis not present

## 2023-07-30 DIAGNOSIS — J189 Pneumonia, unspecified organism: Secondary | ICD-10-CM | POA: Diagnosis not present

## 2023-07-30 DIAGNOSIS — E1165 Type 2 diabetes mellitus with hyperglycemia: Secondary | ICD-10-CM

## 2023-07-30 LAB — MAGNESIUM: Magnesium: 2.9 mg/dL — ABNORMAL HIGH (ref 1.7–2.4)

## 2023-07-30 LAB — CBC
HCT: 22.3 % — ABNORMAL LOW (ref 39.0–52.0)
Hemoglobin: 6.9 g/dL — CL (ref 13.0–17.0)
MCH: 30.7 pg (ref 26.0–34.0)
MCHC: 30.9 g/dL (ref 30.0–36.0)
MCV: 99.1 fL (ref 80.0–100.0)
Platelets: 169 10*3/uL (ref 150–400)
RBC: 2.25 MIL/uL — ABNORMAL LOW (ref 4.22–5.81)
RDW: 15 % (ref 11.5–15.5)
WBC: 5.4 10*3/uL (ref 4.0–10.5)
nRBC: 0 % (ref 0.0–0.2)

## 2023-07-30 LAB — CARBAMAZEPINE LEVEL, TOTAL: Carbamazepine Lvl: 5.7 ug/mL (ref 4.0–12.0)

## 2023-07-30 LAB — GLUCOSE, CAPILLARY
Glucose-Capillary: 119 mg/dL — ABNORMAL HIGH (ref 70–99)
Glucose-Capillary: 180 mg/dL — ABNORMAL HIGH (ref 70–99)
Glucose-Capillary: 192 mg/dL — ABNORMAL HIGH (ref 70–99)
Glucose-Capillary: 133 mg/dL — ABNORMAL HIGH (ref 70–99)
Glucose-Capillary: 140 mg/dL — ABNORMAL HIGH (ref 70–99)

## 2023-07-30 LAB — BASIC METABOLIC PANEL WITH GFR
Anion gap: 12 (ref 5–15)
BUN: 85 mg/dL — ABNORMAL HIGH (ref 8–23)
CO2: 28 mmol/L (ref 22–32)
Calcium: 7.9 mg/dL — ABNORMAL LOW (ref 8.9–10.3)
Chloride: 106 mmol/L (ref 98–111)
Creatinine, Ser: 1.99 mg/dL — ABNORMAL HIGH (ref 0.61–1.24)
GFR, Estimated: 32 mL/min — ABNORMAL LOW (ref >60)
Glucose, Bld: 153 mg/dL — ABNORMAL HIGH (ref 70–99)
Potassium: 4.8 mmol/L (ref 3.5–5.1)
Sodium: 146 mmol/L — ABNORMAL HIGH (ref 135–145)

## 2023-07-30 LAB — HEMOGLOBIN AND HEMATOCRIT, BLOOD
HCT: 25.8 % — ABNORMAL LOW (ref 39.0–52.0)
Hemoglobin: 8.1 g/dL — ABNORMAL LOW (ref 13.0–17.0)

## 2023-07-30 LAB — PHOSPHORUS: Phosphorus: 5.5 mg/dL — ABNORMAL HIGH (ref 2.5–4.6)

## 2023-07-30 LAB — ABO/RH: ABO/RH(D): O POS

## 2023-07-30 LAB — PREPARE RBC (CROSSMATCH)

## 2023-07-30 MED ORDER — SODIUM CHLORIDE 0.9% IV SOLUTION: Freq: Once | INTRAVENOUS | Status: DC

## 2023-07-30 MED ORDER — FREE WATER
200.0000 mL | Freq: Four times a day (QID) | Status: DC
  Administered 2023-07-30 – 2023-08-01 (×8): 200 mL

## 2023-07-30 MED ORDER — MIDODRINE HCL 5 MG PO TABS
5.0000 mg | ORAL_TABLET | Freq: Three times a day (TID) | ORAL | Status: DC
  Administered 2023-07-30 – 2023-08-02 (×10): 5 mg
  Filled 2023-07-30 (×10): qty 1

## 2023-07-30 MED ORDER — MIDODRINE HCL 5 MG PO TABS: 5.0000 mg | ORAL_TABLET | Freq: Three times a day (TID) | ORAL | Status: DC

## 2023-07-30 NOTE — Progress Notes (Signed)
 Date and time results received: 07/30/23 0545 (use smartphrase ".now" to insert current time)  Test: Hemoglobin Critical Value: 6.9  Name of Provider Notified: CCM MD  Orders Received? Or Actions Taken?: Awaiting orders.    Care ongoing.   Sonna Dus, RN

## 2023-07-30 NOTE — Progress Notes (Signed)
 eLink Physician-Brief Progress Note Patient Name: Jack White DOB: [birth year] MRN: [id number]   Date of Service  07/30/2023  HPI/Events of Note  Hemoglobin 6.9, bleeding, progressive downtrend  eICU Interventions  Transfuse 1 unit PRBC     Intervention Category Intermediate Interventions: Bleeding - evaluation and treatment with blood products  [name] 07/30/2023, 5:55 AM

## 2023-07-30 NOTE — Progress Notes (Addendum)
 Physical Therapy Treatment Patient Details Name: Jack White MRN: [id number] DOB: 1940/12/10 Today's Date: 07/30/2023   History of Present Illness Patient is an 83 y.o. male who presented to the ED via EMS 07/22/2023 as a code stroke after becoming unresponsive at home. He was intubated upon arrival. CT head showed large right holohemispheric acute SDH. Neurosurgery was consulted and patient underwent a  right craniotomy on 07/22/23. 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    PT Comments  Pt demonstrating slight improvement in arousal and command following in comparison to 6/2. Pt opens eyes intermittently to stimulation, stating "cold," and following instruction to wash face. Pt dependent for transition to edge of bed. Session focused on static sitting balance, truncal/scapular activation, and transfer training. Performed sit to stands x 3 with maxA + 2 with heavy posterior lean. "Jack White," hits played for pt engagement and pt moving right side to rhythm. Patient will benefit from continued inpatient follow up therapy, <3 hours/day.   If plan is discharge home, recommend the following: Two people to help with walking and/or transfers;Two people to help with bathing/dressing/bathroom   Can travel by private vehicle     No  Equipment Recommendations  Other (comment) (defer)    Recommendations for Other Services       Precautions / Restrictions Precautions Precautions: Fall Recall of Precautions/Restrictions: Impaired Restrictions Weight Bearing Restrictions Per Provider Order: No     Mobility  Bed Mobility Overal bed mobility: Needs Assistance Bed Mobility: Supine to Sit     Supine to sit: Total assist, +2 for physical assistance Sit to supine: Total assist, +2 for physical assistance   General bed mobility comments: TotalA+ 2 for all aspects    Transfers Overall transfer level: Needs assistance Equipment used: Rolling walker (2  wheels) Transfers: Sit to/from Stand Sit to Stand: Max assist, +2 physical assistance           General transfer comment: Hand over hand guidance to locate handles of walker, anterior foot block provided, maxA + 2 to power up to standing. Heavy posterior lean, pt pulling up on walker rather than pushing down    Ambulation/Gait                   Stairs             Wheelchair Mobility     Tilt Bed    Modified Rankin (Stroke Patients Only)       Balance Overall balance assessment: Needs assistance Sitting-balance support: Feet supported Sitting balance-Leahy Scale: Poor Sitting balance - Comments: Requiring min-maxA                                    Communication Communication Communication: Impaired Factors Affecting Communication: Hearing impaired  Cognition Arousal: Lethargic Behavior During Therapy: Flat affect   PT - Cognitive impairments: Difficult to assess Difficult to assess due to: Level of arousal                     PT - Cognition Comments: Pt able to follow a few one step commands i.e. washing his face Following commands: Impaired      Cueing Cueing Techniques: Verbal cues, Tactile cues, Visual cues  Exercises Other Exercises Other Exercises: Sitting EOB: lateral leans to L, truncal rotation to R/L    General Comments        Pertinent Vitals/Pain  Pain Assessment Pain Assessment: Faces Faces Pain Scale: Hurts a little bit Pain Location: Moaning with positional changes Pain Descriptors / Indicators: Moaning Pain Intervention(s): Monitored during session    Home Living                          Prior Function            PT Goals (current goals can now be found in the care plan section) Acute Rehab PT Goals Patient Stated Goal: pt daughter agreeable to rehab Potential to Achieve Goals: Fair Progress towards PT goals: Progressing toward goals    Frequency    Min 2X/week      PT  Plan      Co-evaluation PT/OT/SLP Co-Evaluation/Treatment: Yes Reason for Co-Treatment: Complexity of the patient's impairments (multi-system involvement);Necessary to address cognition/behavior during functional activity;For patient/therapist safety;To address functional/ADL transfers PT goals addressed during session: Mobility/safety with mobility;Balance        AM-PAC PT "6 Clicks" Mobility   Outcome Measure  Help needed turning from your back to your side while in a flat bed without using bedrails?: Total Help needed moving from lying on your back to sitting on the side of a flat bed without using bedrails?: Total Help needed moving to and from a bed to a chair (including a wheelchair)?: Total Help needed standing up from a chair using your arms (e.g., wheelchair or bedside chair)?: Total Help needed to walk in hospital room?: Total Help needed climbing 3-5 steps with a railing? : Total 6 Click Score: 6    End of Session   Activity Tolerance: Patient tolerated treatment well Patient left: in bed;with call bell/phone within reach Nurse Communication: Mobility status PT Visit Diagnosis: Unsteadiness on feet (R26.81);History of falling (Z91.81);Difficulty in walking, not elsewhere classified (R26.2);Pain     Time: 1326-1400 PT Time Calculation (min) (ACUTE ONLY): 34 min  Charges:    $Therapeutic Activity: 8-22 mins PT General Charges $$ ACUTE PT VISIT: 1 Visit                     Jack White, PT, DPT Acute Rehabilitation Services Office [phone]    Jack White 07/30/2023, 3:32 PM

## 2023-07-30 NOTE — Progress Notes (Addendum)
 NAME:  Jack White, MRN:  [id number], DOB:  10-Sep-1940, LOS: 8a ADMISSION DATE:  07/21/2023, CONSULTATION DATE:  07/27/2023 REFERRING MD: Michale Age - NSGY, CHIEF COMPLAINT:  AMS   History of Present Illness:  Pt encephalopathic, therefore HPI obtained from EMR and from pt's daughter at bedside.   16 yoM with PMH as below, significant for HTN, HLD, DMT2, possible parkinsons disease, trigeminal neuralgia, CKD 3a, and BPH, who presented after mechanical fall on 5/27, not on AC or antiplatelets who developed N/V, AMS, with left sided weakness found to have large right SDH with 1.64m right to left MLS.  Admitted to NSGY and taken for emergent right craniotomy.  Was seen by PCCM while in Neuro ICU, extubated 5/27 evening.  ICU stay complicated by hypoactive delirium, cortrak placed for meds/ nutrition.  pt transferred out of ICU 5/30, CTH unchanged 5/31.  Started to develop increased fever curve.  On 6/1 PCCM re-consulted for development on hypoxia, previously on room air with upper airway congestion.  Daughter reports has not been coughing and clearing secretions today.  Concern for PE therefore sent for CTA PE study which is pending official read.    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 Intubated for airway protection. NSG taking to OR. Pccm consulted > extubated by afternoon 5/28 intermittent episodes of agitation/confusion with difficulty sleeping overnight 5/29  difficulty with restlessness overnight, sleeping poorly on a.m. assessment 5/30 repeat CTH stable, tx out of ICU 6/1 PCCM re-consult for hypoxia 6/3 mproved alertness this AM, following commands for RN (thumbs up, wiggling toes, stating name)Fatigues quickly, but more awake than prior CXR with low lung volumes, +bronchovascular crowding, diffuse interstitial/patchy opacities (edema vs. Multifocal PNA)BPs borderline, MAPs low-mid 70s 6/4 hgb down 6.9, 1 unit blood. Na and scr up after lasix day prior.   Interim History / Subjective:  No distress. Seems to be indicating he has a HA. Just got APAP   Objective:    Blood pressure (!) 101/49, pulse 86, temperature 98.5 F (36.9 C), temperature source Axillary, resp. rate 17, height 6' (1.829 m), weight 105.1 kg, SpO2 98%.    FiO2 (%):  [28 %] 28 %   Intake/Output Summary (Last 24 hours) at 07/30/2023 0721 Last data filed at 07/30/2023 0600 Gross per 24 hour  Intake 1557.03 ml  Output 1750 ml  Net -192.97 ml   Filed Weights   07/28/23 0500 07/29/23 0500 07/30/23 0431  Weight: 100.7 kg 104.3 kg 105.1 kg   Physical Examination: General this is a 83 year old male. He is laying in bed. No distress today but is moaning intermittently w/ right hand on head HENT right surgical staples well approx. PERRL MMM Pulm now on room air no accessory use. Crackles faint noted posterior right side Card rrr w/ soft systolic HM best heard over tricuspid region Abd soft has tubefeeds Ext scattered areas of ecchymosis. Dependent edema. Pulses palp. Has pressure relief dressing in place over gluteal fold.  Neuro awake,  does not currently follow commands. Moves spontaneously and purposefully w/ the right side. Left side weak. W/d to pain. Cannot get him to verbalize Gu cl yellow  Resolved problem List:  Acute hypoxic resp failure  Sepsis  Assessment and Plan:  aspiration PNA Small bilateral pleural effusions Concern for aspiration PNA, CT Chest PE Protocol  negative for PE, +small bilateral pleural effusion (L > R). Plan Cont pulse ox goal sats > 92% Aspiration precautions  HHFNC if status worsening, not a great BiPAP candidate in the setting of aspiration (currently on room air)  Monitor I&Os/renal indices Pulmonary hygiene including HTS nebs TID, chest PT, IS/flutter (when able), NTS PRN Cbc am  F/u Cx data Day 4 Unasyn, will get CXR am. Likely complete 5d rx   Traumatic SDH w brain compression S/p crani 5/27 Hypoactive delirium Sleep disturbance  Possible parkinsons Peripheral neuropathy  Plan Postoperative management per NSGY AEDs as ordered (Tegretol ); checking level today given dec'd MS  Continue Sinemet  Holding Seroquel /melatonin Neuroprotective measures: HOB > 30 degrees, normoglycemia, normothermia, electrolytes WNL PT/OT/SLP  H/o HTN and HLD BP boarderline low. W/ renal  Plan Hold lopressor  for now  Hydralazine  PRN Continue statin   CKD stage 3a BPH - got lasix and albumin 6/3, scr bumped a little. Still w/ overall net +1.8 liters Plan Ensure MAP > 65 Renal dose meds Strict I&O Hold off on diuresis today  Am chem   Fluid and Electrolyte imbalance: hypernatremia following diuresis  Plan Free water replacement via tube Am chem   DM2 w hyperglycemia  - excellent control currently  Plan Cont Basal Semglee 10U daily TF coverage + SSI for Goal CBG 140-180  At risk for malnutrition - RD/Nutrition consulted - Cortrak in place Plan tubefeeds  L heel ulcer POA Seen by podiatry at [facility] on 5/22 s/p debridement - WOC consulted, 6/2 here Plan Apply sacral foam dressing, change every 3 days or PRN.   Chronic normocytic anemia  - hgb down 6.9. was 8, no obvious bleeding Plan Transfusion 6/4 Trigger remains <7 or for active bleed and hemodynamic compromise  Ck FOB from stool If hgb low again tomorrow will need to stop LMWH, if FOB is positive change H2B to PPI   Best Practice: (right click and "Reselect  all SmartList Selections" daily)   Diet/type: tubefeeds DVT prophylaxis SCD and LMWH Pressure ulcer(s): pressure ulcer assessment deferred  GI prophylaxis: H2B Lines: N/A Foley:  N/A Code Status:  limited DNR status - DNR, but pre arrest interventions including intubation ok. Confirmed again 6/1 Last date of multidisciplinary goals of care discussion [6/1]  Signature:  My time 60  min   Armstead Bertrand, NP [facility] 07/30/23 7:21 AM

## 2023-07-30 NOTE — Progress Notes (Signed)
 OT Cancellation Note  Patient Details Name: Jack White MRN: [id number] DOB: 09-29-1940   Cancelled Treatment:    Reason Eval/Treat Not Completed: Medical issues which prohibited therapy. Hgb 6.9. Will follow up after rounds.   Karilyn Ouch, OTR/L [facility] Acute Rehabilitation Office: [phone]   [name] 07/30/2023, 7:50 AM

## 2023-07-30 NOTE — Progress Notes (Signed)
 PT Cancellation Note  Patient Details Name: Jack White MRN: [id number] DOB: [birth year]   Cancelled Treatment:    Reason Eval/Treat Not Completed: Medical issues which prohibited therapy (hgb 6.9)  Verdia Glad, PT, DPT Acute Rehabilitation Services Office [phone]    Claria Crofts 07/30/2023, 7:46 AM

## 2023-07-30 NOTE — Progress Notes (Signed)
 Occupational Therapy Treatment Patient Details Name: Jack White MRN: [id number] DOB: 04/08/1940 Today's Date: 07/30/2023   History of present illness Patient is an 83 y.o. male who presented to the ED via EMS 07/22/2023 as a code stroke after becoming unresponsive at home. He was intubated upon arrival. CT head showed large right holohemispheric acute SDH. Neurosurgery was consulted and patient underwent a  right craniotomy on 07/22/23. 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   OT comments  Pt progressing toward established OT goals with improved but not consistent arousal to alert state this session. Pt able to come to EOB with max A +2 with fair initiation with cues but BUE very weak. Worked on sitting balance EOB, truncal rotation, and lateral leans onto elbows. Pt needing min-max A for sitting balance. Facilitating arousal with music of pt preference as reported by family and pt with ~3 attempts to sing portions of songs. Able to answer basic questions with one word inconsistently. Pt needing max A +2 for STS with RW with BLE block and assist to place LUE on walker this session. Patient will benefit from continued inpatient follow up therapy, <3 hours/day       If plan is discharge home, recommend the following:  Two people to help with walking and/or transfers;Two people to help with bathing/dressing/bathroom;Other (comment)   Equipment Recommendations  Wheelchair (measurements OT);Wheelchair cushion (measurements OT);BSC/3in1;Hoyer lift    Recommendations for Other Services      Precautions / Restrictions Precautions Precautions: Fall Recall of Precautions/Restrictions: Impaired Restrictions Weight Bearing Restrictions Per Provider Order: No       Mobility Bed Mobility Overal bed mobility: Needs Assistance Bed Mobility: Supine to Sit     Supine to sit: Total assist, +2 for physical assistance Sit to supine: Total assist, +2 for  physical assistance   General bed mobility comments: TotalA+ 2 for all aspects    Transfers Overall transfer level: Needs assistance Equipment used: Rolling walker (2 wheels) Transfers: Sit to/from Stand Sit to Stand: Max assist, +2 physical assistance           General transfer comment: Hand over hand guidance to locate handles of walker, anterior foot block provided, maxA + 2 to power up to standing. Heavy posterior lean, pt pulling up on walker rather than pushing down     Balance Overall balance assessment: Needs assistance Sitting-balance support: Feet supported Sitting balance-Leahy Scale: Poor Sitting balance - Comments: Requiring min-maxA. Poor extension of neck in sitting with difficulty looking up due to inability to lift head to neutral   Standing balance support: Bilateral upper extremity supported, Reliant on assistive device for balance Standing balance-Leahy Scale: Zero Standing balance comment: RW and external support of therapists. Pt with posterior bias and increased difficulty achieving an upright posture.                           ADL either performed or assessed with clinical judgement   ADL Overall ADL's : Needs assistance/impaired     Grooming: Wash/dry face;Minimal assistance;Sitting Grooming Details (indicate cue type and reason): Min A for sitting balance during washing face after working on balance at EOB for several minutes                 Toilet Transfer: Maximal assistance;+2 for physical assistance;+2 for safety/equipment Toilet Transfer Details (indicate cue type and reason): for STS  Extremity/Trunk Assessment Upper Extremity Assessment Upper Extremity Assessment: RUE deficits/detail;LUE deficits/detail (pt reports L dominant but unsure accuracy of report as chart says R dominant) RUE Deficits / Details: squeeze hand, able to reach up to give high five with ~60 degrees shoulder flexion, wash face LUE  Deficits / Details: squeezes hand with increased time and cueing, some inattention?. Poor shoulder and elbow AROM but passively elbow WFL and shoulder at least to 90 this session LUE Coordination: decreased fine motor;decreased gross motor   Lower Extremity Assessment Lower Extremity Assessment: Defer to PT evaluation        Vision   Vision Assessment?: Vision impaired- to be further tested in functional context Additional Comments: greater visual attention to R side than L but able to scan to L with increased time. continue to assess   Perception     Praxis     Communication Communication Communication: Impaired Factors Affecting Communication: Hearing impaired   Cognition Arousal: Lethargic Behavior During Therapy: Flat affect Cognition: Cognition impaired     Awareness: Intellectual awareness impaired, Online awareness impaired Memory impairment (select all impairments): Short-term memory, Working memory, Non-declarative long-term memory, Geneticist, molecular long-term memory Attention impairment (select first level of impairment): Sustained attention Executive functioning impairment (select all impairments): Initiation, Organization, Sequencing, Reasoning, Problem solving OT - Cognition Comments: Follows one step commands bilaterally but R more consistent than L although limited by physical impairment on L as well. Pt oriented to palce and self. Much more aroused with eyes open this session                 Following commands: Impaired Following commands impaired: Follows one step commands inconsistently      Cueing   Cueing Techniques: Verbal cues, Tactile cues, Visual cues  Exercises Exercises: Other exercises Other Exercises Other Exercises: Sitting EOB: lateral leans to L, truncal rotation to R/L    Shoulder Instructions       General Comments VSS.    Pertinent Vitals/ Pain       Pain Assessment Pain Assessment: Faces Faces Pain Scale: Hurts a little bit Pain  Location: Moaning with positional changes Pain Descriptors / Indicators: Moaning Pain Intervention(s): Limited activity within patient's tolerance, Monitored during session  Home Living                                          Prior Functioning/Environment              Frequency  Min 2X/week        Progress Toward Goals  OT Goals(current goals can now be found in the care plan section)  Progress towards OT goals: Progressing toward goals  Acute Rehab OT Goals OT Goal Formulation: Patient unable to participate in goal setting Time For Goal Achievement: 08/06/23 Potential to Achieve Goals: Fair ADL Goals Pt Will Perform Eating: with mod assist;sitting Pt Will Perform Grooming: with mod assist;sitting Pt Will Perform Upper Body Bathing: with mod assist;sitting Pt Will Transfer to Toilet: with mod assist;bedside commode;stand pivot transfer Additional ADL Goal #1: Pt will demonstrate improved cognitive function by accurately orienting to place, time, and situation and follow 2 step commands 50% or more of the time with minimal VC from therapist during structured activity. Additional ADL Goal #2: Pt will demonstrate improved bed mobility by transitioning from supine to sitting EOB with Mod A.  Plan      Co-evaluation  PT/OT/SLP Co-Evaluation/Treatment: Yes Reason for Co-Treatment: Complexity of the patient's impairments (multi-system involvement);Necessary to address cognition/behavior during functional activity;For patient/therapist safety;To address functional/ADL transfers PT goals addressed during session: Mobility/safety with mobility;Balance OT goals addressed during session: ADL's and self-care      AM-PAC OT "6 Clicks" Daily Activity     Outcome Measure   Help from another person eating meals?: Total Help from another person taking care of personal grooming?: A Lot Help from another person toileting, which includes using toliet, bedpan, or  urinal?: Total Help from another person bathing (including washing, rinsing, drying)?: A Lot Help from another person to put on and taking off regular upper body clothing?: A Lot Help from another person to put on and taking off regular lower body clothing?: Total 6 Click Score: 9    End of Session Equipment Utilized During Treatment: Gait belt;Rolling walker (2 wheels)  OT Visit Diagnosis: Unsteadiness on feet (R26.81);Muscle weakness (generalized) (M62.81);History of falling (Z91.81);Other symptoms and signs involving cognitive function   Activity Tolerance Patient limited by lethargy   Patient Left in bed;with call bell/phone within reach;with bed alarm set;with family/visitor present   Nurse Communication Mobility status        Time: 9528-4132 OT Time Calculation (min): 32 min  Charges: OT General Charges $OT Visit: 1 Visit OT Treatments $Self Care/Home Management : 8-22 mins  Karilyn Ouch, OTR/L [facility] Acute Rehabilitation Office: [phone]   [name] 07/30/2023, 4:40 PM

## 2023-07-30 NOTE — Progress Notes (Signed)
  NEUROSURGERY PROGRESS NOTE   Pt seen and examined. No issues overnight.   EXAM: Temp:  [98.2 F (36.8 C)-99.3 F (37.4 C)] 98.2 F (36.8 C) (06/04 1151) Pulse Rate:  [79-108] 79 (06/04 1600) Resp:  [17-39] 26 (06/04 1600) BP: (86-134)/(44-65) 110/57 (06/04 1600) SpO2:  [92 %-100 %] 95 % (06/04 1600) FiO2 (%):  [28 %] 28 % (06/04 0720) Weight:  [105.1 kg] 105.1 kg (06/04 0431) Intake/Output      06/03 0701 06/04 0700 06/04 0701 06/05 0700   Blood  315   NG/GT 1150 700   IV Piggyback 407 200   Total Intake(mL/kg) 1557 (14.8) 1215 (11.6)   Urine (mL/kg/hr) 1750 (0.7)    Stool     Total Output 1750    Net -193 +1215        Stool Occurrence  1 x    Easily arouses Not conversant for me, says name/hospital earlier Occasionally FC LUE/LLE, less movement RUE/RLE Wound c/d/i   LABS: Lab Results  Component Value Date   CREATININE 1.99 (H) 07/30/2023   BUN 85 (H) 07/30/2023   NA 146 (H) 07/30/2023   K 4.8 07/30/2023   CL 106 07/30/2023   CO2 28 07/30/2023   Lab Results  Component Value Date   WBC 5.4 07/30/2023   HGB 8.1 (L) 07/30/2023   HCT 25.8 (L) 07/30/2023   MCV 99.1 07/30/2023   PLT 169 07/30/2023    IMPRESSION: - 83 y.o. male s/p right crani for acute SDH, neurologically stable - Aspiration PNA  PLAN: - Cont mgmt per PCCM - Staples out next week   Augusto Blonder, MD [facility]

## 2023-07-31 ENCOUNTER — Inpatient Hospital Stay (HOSPITAL_COMMUNITY)

## 2023-07-31 DIAGNOSIS — E878 Other disorders of electrolyte and fluid balance, not elsewhere classified: Secondary | ICD-10-CM | POA: Diagnosis not present

## 2023-07-31 DIAGNOSIS — J90 Pleural effusion, not elsewhere classified: Secondary | ICD-10-CM | POA: Diagnosis not present

## 2023-07-31 DIAGNOSIS — E1165 Type 2 diabetes mellitus with hyperglycemia: Secondary | ICD-10-CM | POA: Diagnosis not present

## 2023-07-31 DIAGNOSIS — J189 Pneumonia, unspecified organism: Secondary | ICD-10-CM | POA: Diagnosis not present

## 2023-07-31 LAB — BASIC METABOLIC PANEL WITH GFR
Anion gap: 8 (ref 5–15)
BUN: 88 mg/dL — ABNORMAL HIGH (ref 8–23)
CO2: 27 mmol/L (ref 22–32)
Calcium: 7.8 mg/dL — ABNORMAL LOW (ref 8.9–10.3)
Chloride: 109 mmol/L (ref 98–111)
Creatinine, Ser: 2.17 mg/dL — ABNORMAL HIGH (ref 0.61–1.24)
GFR, Estimated: 29 mL/min — ABNORMAL LOW (ref >60)
Glucose, Bld: 201 mg/dL — ABNORMAL HIGH (ref 70–99)
Potassium: 4.7 mmol/L (ref 3.5–5.1)
Sodium: 144 mmol/L (ref 135–145)

## 2023-07-31 LAB — CBC
HCT: 25.2 % — ABNORMAL LOW (ref 39.0–52.0)
Hemoglobin: 7.9 g/dL — ABNORMAL LOW (ref 13.0–17.0)
MCH: 30.4 pg (ref 26.0–34.0)
MCHC: 31.3 g/dL (ref 30.0–36.0)
MCV: 96.9 fL (ref 80.0–100.0)
Platelets: 179 10*3/uL (ref 150–400)
RBC: 2.6 MIL/uL — ABNORMAL LOW (ref 4.22–5.81)
RDW: 16.1 % — ABNORMAL HIGH (ref 11.5–15.5)
WBC: 5 10*3/uL (ref 4.0–10.5)
nRBC: 0 % (ref 0.0–0.2)

## 2023-07-31 LAB — TYPE AND SCREEN
ABO/RH(D): O POS
Antibody Screen: NEGATIVE
Unit division: 0

## 2023-07-31 LAB — GLUCOSE, CAPILLARY
Glucose-Capillary: 107 mg/dL — ABNORMAL HIGH (ref 70–99)
Glucose-Capillary: 110 mg/dL — ABNORMAL HIGH (ref 70–99)
Glucose-Capillary: 127 mg/dL — ABNORMAL HIGH (ref 70–99)
Glucose-Capillary: 140 mg/dL — ABNORMAL HIGH (ref 70–99)
Glucose-Capillary: 162 mg/dL — ABNORMAL HIGH (ref 70–99)
Glucose-Capillary: 165 mg/dL — ABNORMAL HIGH (ref 70–99)
Glucose-Capillary: 192 mg/dL — ABNORMAL HIGH (ref 70–99)

## 2023-07-31 LAB — SODIUM, URINE, RANDOM: Sodium, Ur: 85 mmol/L

## 2023-07-31 LAB — BPAM RBC
Blood Product Expiration Date: 202506252359
ISSUE DATE / TIME: 202506040746
Unit Type and Rh: 5100

## 2023-07-31 LAB — CREATININE, URINE, RANDOM: Creatinine, Urine: 59 mg/dL

## 2023-07-31 MED ORDER — LACTATED RINGERS IV BOLUS
750.0000 mL | Freq: Once | INTRAVENOUS | Status: AC
  Administered 2023-07-31: 750 mL via INTRAVENOUS

## 2023-07-31 MED ORDER — ALBUTEROL SULFATE (2.5 MG/3ML) 0.083% IN NEBU
2.5000 mg | INHALATION_SOLUTION | RESPIRATORY_TRACT | Status: DC | PRN
  Administered 2023-07-31 – 2023-08-13 (×6): 2.5 mg via RESPIRATORY_TRACT
  Filled 2023-07-31 (×6): qty 3

## 2023-07-31 MED ORDER — INSULIN ASPART 100 UNIT/ML IJ SOLN
6.0000 [IU] | INTRAMUSCULAR | Status: DC
  Administered 2023-07-31 – 2023-08-01 (×7): 6 [IU] via SUBCUTANEOUS

## 2023-07-31 MED ORDER — LACTATED RINGERS IV SOLN: INTRAVENOUS | Status: DC

## 2023-07-31 MED ORDER — SODIUM CHLORIDE 3 % IN NEBU
INHALATION_SOLUTION | RESPIRATORY_TRACT | Status: AC
  Filled 2023-07-31: qty 4

## 2023-07-31 NOTE — Progress Notes (Signed)
 NAME:  Jack White, MRN:  [id number], DOB:  [birth year], LOS: 9a ADMISSION DATE:  07/21/2023, CONSULTATION DATE:  07/27/2023 REFERRING MD: Michale Age - NSGY, CHIEF COMPLAINT:  AMS   History of Present Illness:  Pt encephalopathic, therefore HPI obtained from EMR and from pt's daughter at bedside.   21 yoM with PMH as below, significant for HTN, HLD, DMT2, possible parkinsons disease, trigeminal neuralgia, CKD 3a, and BPH, who presented after mechanical fall on 5/27, not on AC or antiplatelets who developed N/V, AMS, with left sided weakness found to have large right SDH with 1.13m right to left MLS.  Admitted to NSGY and taken for emergent right craniotomy.  Was seen by PCCM while in Neuro ICU, extubated 5/27 evening.  ICU stay complicated by hypoactive delirium, cortrak placed for meds/ nutrition.  pt transferred out of ICU 5/30, CTH unchanged 5/31.  Started to develop increased fever curve.  On 6/1 PCCM re-consulted for development on hypoxia, previously on room air with upper airway congestion.  Daughter reports has not been coughing and clearing secretions today.  Concern for PE therefore sent for CTA PE study which is pending official read.    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 Intubated for airway protection. NSG taking to OR. Pccm consulted > extubated by afternoon 5/28 intermittent episodes of agitation/confusion with difficulty sleeping overnight 5/29  difficulty with restlessness overnight, sleeping poorly on a.m. assessment 5/30 repeat CTH stable, tx out of ICU 6/1 PCCM re-consult for hypoxia 6/3 mproved alertness this AM, following commands for RN (thumbs up, wiggling toes, stating name)Fatigues quickly, but more awake than prior CXR with low lung volumes, +bronchovascular crowding, diffuse interstitial/patchy opacities (edema vs. Multifocal PNA)BPs borderline, MAPs low-mid 70s 6/4 hgb down 6.9, 1 unit blood. Na and scr up after lasix day prior.  6/5 scr still rising. Na improved w/ Free water. IVC on POCUS collapsible w/ marked resp variation so gave IVFs  Interim History / Subjective:  Looks about the same Objective:    Blood pressure 134/70, pulse 90, temperature 98.6 F (37 C), temperature source Axillary, resp. rate (!) 24, height 6' (1.829 m), weight 104.3 kg, SpO2 99%.        Intake/Output Summary (Last 24 hours) at 07/31/2023 0720 Last data filed at 07/31/2023 0600 Gross per 24 hour  Intake 2515 ml  Output 1525 ml  Net 990 ml   Filed Weights   07/29/23 0500 07/30/23 0431 07/31/23 0500  Weight: 104.3 kg 105.1 kg 104.3 kg   Physical Examination: General 83 year old male. Laying in bed no distress HENT the staples are intact from right crani. MM are dry. Left sided facial droop Pulm clear. No accessory use. He is back on [place] BUT sats 100%  Pcxr personally reviewed shows improved aeration Card RRR w/ soft systolic HM best heard over tricuspid region POCUS shows collapsible IVC w/ marked resp variation.  Abd soft old surgical scar healed. Tol TFs Ext dependent edema scattered areas of  ecchymosis GU cl yellow Neuro more spont awake this am. Not following commands for me but purposefully moves right and left remains weaker req painful stim or at times will spont move. Overall exam about same as yesterday   Resolved problem List:  Acute hypoxic resp failure  Sepsis  Assessment and Plan:  aspiration PNA Small bilateral  pleural effusions Concern for aspiration PNA, CT Chest PE Protocol negative for PE, +small bilateral pleural effusion (L > R). CXR today much improved  Plan Cont pulse ox goal sats > 92% Aspiration precautions  Monitor I&Os/renal indices Pulmonary hygiene including HTS nebs TID, chest PT, IS/flutter (when able), NTS PRN F/u Cx data Day 5 Unasyn,dc after today   Traumatic SDH w brain compression S/p crani 5/27 Hypoactive delirium Sleep disturbance  Possible parkinsons Peripheral neuropathy  The hypoactive delirium is driving factor however rising BUN not helping Plan Postoperative management per NSGY AEDs as ordered (Tegretol ) Continue Sinemet  Holding Seroquel , was on melatonin this was also stopped  Neuroprotective measures: HOB > 30 degrees, normoglycemia, normothermia, electrolytes WNL PT/OT/SLP  H/o HTN and HLD BP boarderline low. W/ renal  Plan Hold lopressor  to ensure adequate MAP Cont midodrine (started 6/4) Hydralazine  PRN Continue statin   CKD stage 3a BPH - got lasix and albumin 6/3, scr bumped a little and continues to climb. MAP appears to have been at Goal but IVC collapses  Plan Ensure MAP > 65 Sending urine Na and Creatinine IV LR at 75 ml x 24 hrs Renal dose meds Strict I&O Hold off on diuresis today  Am chem   Fluid and Electrolyte imbalance: hypernatremia following diuresis  Improved w/ free water Plan No change in current FWR schedule ( q 6) Am chem   DM2 w hyperglycemia  - running a little higher overnight  Plan Cont Basal Semglee 10U daily TF coverage (inc to 6 units q 4) + SSI for Goal CBG 140-180  Moderate Protein Calorie Malnutrition 2/2 critical illness - RD/Nutrition consulted - Cortrak in place Plan tubefeeds  L heel ulcer POA Seen by podiatry at [facility] on 5/22 s/p debridement - WOC consulted, 6/2 here Plan Apply sacral foam dressing, change every 3 days or PRN.   Chronic normocytic anemia  - hgb down 6.9 on 6/4, got a  unit of blood. Post xfusion 8.1 stable at 7.9 this am  Plan Trigger remains <7 or for active bleed and hemodynamic compromise  Ck FOB from stool If hgb low again tomorrow will need to stop LMWH, if FOB is positive change H2B to PPI   Best Practice: (right click and "Reselect all SmartList Selections" daily)   Diet/type: tubefeeds DVT prophylaxis SCD and LMWH Pressure ulcer(s): pressure ulcer assessment deferred  GI prophylaxis: H2B Lines: N/A Foley:  N/A Code Status:  limited DNR status - DNR, but pre arrest interventions including intubation ok. Confirmed again 6/1 Last date of multidisciplinary goals of care discussion [6/1]  Signature:  Hadley Leu ACNP-BC [facility] Pulmonary/Critical Care Pager # [phone] OR # [phone] if no answer  My cct 34 min  Armstead Bertrand, NP [facility] 07/31/23 7:20 AM

## 2023-07-31 NOTE — Progress Notes (Signed)
 CPT held at this time. IV team at bedside. RT will continue to monitor and be available as needed

## 2023-07-31 NOTE — Progress Notes (Signed)
 Speech Language Pathology Treatment: Dysphagia  Patient Details Name: Jack White MRN: [id number] DOB: 1940-11-09 Today's Date: 07/31/2023 Time: 0821-0853 SLP Time Calculation (min) (ACUTE ONLY): 32 min  Assessment / Plan / Recommendation Clinical Impression  Pt more alert this morning per RN, and had eyes open upon SLP arrival. He made verbal request for "water" and did initiate drinking via straw, needing multiple attempts to get liquid all the way up into his mouth. He consistently elicited a swallow with thin liquids, although signs of potential pharyngeal dysphagia include repeated swallows per bolus and intermittent throat clearing. Orally, he did best with thin liquids via straw though, as he was less responsive to spoonfuls, requiring more passive administration, and with anterior loss observed. He swallowed a small ice chip whole and he orally held a small bite of pudding. His level of alertness decreased across the session and per his daughter Jack White, he is still fluctuating a lot during the day. Recommend that he resume small sips of thin liquids when alert and requesting, although in light of PNA, would perform oral care first and limit thin liquids to water specifically. Education about rationale and strategies was given to Jack White who was appreciative of him getting something PO. Will continue to follow up and determine need for additional instrumental testing as level of alertness improves.    HPI HPI: Patient is an 83 y.o. male with PMH: possible Parkinson's disease, trigeminal neuralgia, DM. He presented to the ED via EMS as a code stroke after becoming unresponsive at home. He was intubated upon arrival. CT head showed large right holohemispheric acute SDH. Neurosurgery was consulted and patient underwent a  right craniotomy on 07/22/23. He was extubated 5/27 at 1136 to 2L via nasal cannula. He failed [facility] swallow screen with RN and SLP swallow evaluation ordered.      SLP Plan   Continue with current plan of care      Recommendations for follow up therapy are one component of a multi-disciplinary discharge planning process, led by the attending physician.  Recommendations may be updated based on patient status, additional functional criteria and insurance authorization.    Recommendations  Diet recommendations: NPO;Other(comment) (small sips of water after oral care if alert and asking) Medication Administration: Via alternative means                  Oral care QID   None Dysphagia, unspecified (R13.10)     Continue with current plan of care     Beth Brooke., M.A. CCC-SLP Acute Rehabilitation Services Office: [phone]  Secure chat preferred   07/31/2023, 10:00 AM

## 2023-07-31 NOTE — TOC Progression Note (Addendum)
 Transition of Care [facility]) - Progression Note    Patient Details  Name: Jack White MRN: [id number] Date of Birth: 11/27/1940  [facility] CM/SW Contact  Jannice Mends, LCSW Phone Number: 07/31/2023, 8:48 AM  Clinical Narrative:    8:48 AM-CSW left voicemail for patient's daughter to answer questions about SNF. Referrals will not be sent until Cortrak is removed.   9:16 AM-Daughter returned call and CSW answered questions about SNF (versus CIR as they do not feel patient is appropriate at this point) and encouraged daughter to apply for Medicaid for patient's future needs. CSW went over Medicare benefit for SNF and requirement of a pre-authorization. Their family is familiar with several SNFs in [facility] where they prefer for patient to end up. CSW will continue to follow for medical stability.    Expected Discharge Plan: Skilled Nursing Facility Barriers to Discharge: Continued Medical Work up, English as a second language teacher, SNF Pending bed offer  Expected Discharge Plan and Services In-house Referral: Clinical Social Work Discharge Planning Services: [employer] Consult Post Acute Care Choice: Skilled Nursing Facility Living arrangements for the past 2 months: Mobile Home                                       Social Determinants of Health (SDOH) Interventions SDOH Screenings   Food Insecurity: No Food Insecurity (07/22/2023)  Housing: Low Risk  (07/22/2023)  Transportation Needs: No Transportation Needs (07/22/2023)  Utilities: Not At Risk (07/22/2023)  Financial Resource Strain: Patient Declined (02/07/2023)   Received from [facility]  Social Connections: Moderately Integrated (07/24/2023)  Tobacco Use: Low Risk  (07/22/2023)  Recent Concern: Tobacco Use - Medium Risk (07/17/2023)   Received from [facility]    Readmission Risk Interventions     No data to display

## 2023-08-01 ENCOUNTER — Inpatient Hospital Stay (HOSPITAL_COMMUNITY)

## 2023-08-01 DIAGNOSIS — E878 Other disorders of electrolyte and fluid balance, not elsewhere classified: Secondary | ICD-10-CM | POA: Diagnosis not present

## 2023-08-01 DIAGNOSIS — E1165 Type 2 diabetes mellitus with hyperglycemia: Secondary | ICD-10-CM | POA: Diagnosis not present

## 2023-08-01 DIAGNOSIS — J90 Pleural effusion, not elsewhere classified: Secondary | ICD-10-CM | POA: Diagnosis not present

## 2023-08-01 DIAGNOSIS — J189 Pneumonia, unspecified organism: Secondary | ICD-10-CM | POA: Diagnosis not present

## 2023-08-01 LAB — CULTURE, BLOOD (ROUTINE X 2)
Culture: NO GROWTH
Culture: NO GROWTH

## 2023-08-01 LAB — GLUCOSE, CAPILLARY
Glucose-Capillary: 103 mg/dL — ABNORMAL HIGH (ref 70–99)
Glucose-Capillary: 128 mg/dL — ABNORMAL HIGH (ref 70–99)
Glucose-Capillary: 132 mg/dL — ABNORMAL HIGH (ref 70–99)
Glucose-Capillary: 137 mg/dL — ABNORMAL HIGH (ref 70–99)
Glucose-Capillary: 138 mg/dL — ABNORMAL HIGH (ref 70–99)
Glucose-Capillary: 140 mg/dL — ABNORMAL HIGH (ref 70–99)

## 2023-08-01 LAB — BASIC METABOLIC PANEL WITH GFR
Anion gap: 9 (ref 5–15)
BUN: 82 mg/dL — ABNORMAL HIGH (ref 8–23)
CO2: 26 mmol/L (ref 22–32)
Calcium: 8.3 mg/dL — ABNORMAL LOW (ref 8.9–10.3)
Chloride: 112 mmol/L — ABNORMAL HIGH (ref 98–111)
Creatinine, Ser: 1.84 mg/dL — ABNORMAL HIGH (ref 0.61–1.24)
GFR, Estimated: 36 mL/min — ABNORMAL LOW (ref >60)
Glucose, Bld: 99 mg/dL (ref 70–99)
Potassium: 4.8 mmol/L (ref 3.5–5.1)
Sodium: 147 mmol/L — ABNORMAL HIGH (ref 135–145)

## 2023-08-01 LAB — CBC
HCT: 27.6 % — ABNORMAL LOW (ref 39.0–52.0)
Hemoglobin: 8.5 g/dL — ABNORMAL LOW (ref 13.0–17.0)
MCH: 30.5 pg (ref 26.0–34.0)
MCHC: 30.8 g/dL (ref 30.0–36.0)
MCV: 98.9 fL (ref 80.0–100.0)
Platelets: 206 10*3/uL (ref 150–400)
RBC: 2.79 MIL/uL — ABNORMAL LOW (ref 4.22–5.81)
RDW: 15.4 % (ref 11.5–15.5)
WBC: 6.1 10*3/uL (ref 4.0–10.5)
nRBC: 0 % (ref 0.0–0.2)

## 2023-08-01 MED ORDER — MODAFINIL 100 MG PO TABS
100.0000 mg | ORAL_TABLET | Freq: Every day | ORAL | Status: DC
  Administered 2023-08-01 – 2023-08-04 (×4): 100 mg
  Filled 2023-08-01 (×4): qty 1

## 2023-08-01 MED ORDER — FREE WATER
200.0000 mL | Status: DC
  Administered 2023-08-01 – 2023-08-06 (×31): 200 mL

## 2023-08-01 MED ORDER — INSULIN ASPART 100 UNIT/ML IJ SOLN
5.0000 [IU] | INTRAMUSCULAR | Status: DC
  Administered 2023-08-01 – 2023-08-07 (×32): 5 [IU] via SUBCUTANEOUS

## 2023-08-01 NOTE — Progress Notes (Addendum)
 NAME:  Jack White, MRN:  [id number], DOB:  Jul 03, 1940, LOS: 10a ADMISSION DATE:  07/21/2023, CONSULTATION DATE:  07/27/2023 REFERRING MD: Michale Age - NSGY, CHIEF COMPLAINT:  AMS   History of Present Illness:  Pt encephalopathic, therefore HPI obtained from EMR and from pt's daughter at bedside.   83 yoM with PMH as below, significant for HTN, HLD, DMT2, possible parkinsons disease, trigeminal neuralgia, CKD 3a, and BPH, who presented after mechanical fall on 5/27, not on AC or antiplatelets who developed N/V, AMS, with left sided weakness found to have large right SDH with 1.1m right to left MLS.  Admitted to NSGY and taken for emergent right craniotomy.  Was seen by PCCM while in Neuro ICU, extubated 5/27 evening.  ICU stay complicated by hypoactive delirium, cortrak placed for meds/ nutrition.  pt transferred out of ICU 5/30, CTH unchanged 5/31.  Started to develop increased fever curve.  On 6/1 PCCM re-consulted for development on hypoxia, previously on room air with upper airway congestion.  Daughter reports has not been coughing and clearing secretions today.  Concern for PE therefore sent for CTA PE study which is pending official read.    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 Intubated for airway protection. NSG taking to OR. Pccm consulted > extubated by afternoon 5/28 intermittent episodes of agitation/confusion with difficulty sleeping overnight 5/29  difficulty with restlessness overnight, sleeping poorly on a.m. assessment 5/30 repeat CTH stable, tx out of ICU 6/1 PCCM re-consult for hypoxia 6/3 mproved alertness this AM, following commands for RN (thumbs up, wiggling toes, stating name)Fatigues quickly, but more awake than prior CXR with low lung volumes, +bronchovascular crowding, diffuse interstitial/patchy opacities (edema vs. Multifocal PNA)BPs borderline, MAPs low-mid 70s 6/4 hgb down 6.9, 1 unit blood. Na and scr up after lasix day prior.  6/5 scr still rising. Na improved w/ Free water. IVC on POCUS collapsible w/ marked resp variation so gave IVFs.  Concerned about possible aspiration event following swallow eval.  During morning exam quite good developed worsening wheezing about an hour after p.o. intake 6/6: Creatinine improved following volume resuscitation.  Still on 2 L.  Mental status still waxing and waning.  IV fluids stopped.  Starting Provigil  Interim History / Subjective:  Looks about the same Objective:    Blood pressure (!) 142/75, pulse 97, temperature 98.1 F (36.7 C), temperature source Axillary, resp. rate 16, height 6' (1.829 m), weight 106.7 kg, SpO2 96%.        Intake/Output Summary (Last 24 hours) at 08/01/2023 0823 Last data filed at 08/01/2023 0600 Gross per 24 hour  Intake 4231.9 ml  Output 1450 ml  Net 2781.9 ml   Filed Weights   07/30/23 0431 07/31/23 0500 08/01/23 0500  Weight: 105.1 kg 104.3 kg 106.7 kg   Physical Examination: General 83 year old male patient lying in bed no acute distress this morning HEENT normocephalic atraumatic mucous membranes are moist crani staples are clean dry and intact pupils equal reactive Pulmonary  clear this morning, occasionally demonstrating upper airway wheezing, on my exam improved when compared to yesterday currently 98% on 2 L nasal cannula no accessory use Cardiac: Regular rate and rhythm Abdomen: Soft nontender Extremities: Warm dry brisk cap refill,  multiple areas of ecchymosis with mild dependent edema Neuro slow to arouse, he typically waxes and wanes.  This morning he was more interactive prior to my assessment however on my assessment required some painful stimulus in order to localize with the right.  The left remains weak, he will moan, and make sounds but I cannot understand his words GU clear yellow Resolved problem List:  Acute hypoxic resp failure  Sepsis  Assessment and Plan:  aspiration PNA Small bilateral pleural effusions Cxr improved as of 5/5. Completed unasyn 5/5. Remains high risk for aspiration  Plan Cont pulse ox goal sats > 92% Aspiration precautions (would keep NPO) He has a swallow eval scheduled for this afternoon if he is the sleepy we should abort I have discussed this with the nursing staff Monitor I&Os/renal indices Pulmonary hygiene including HTS nebs TID, chest PT, IS/flutter (when able), NTS PRN   Traumatic SDH w brain compression S/p crani 5/27 Hypoactive delirium Sleep disturbance  Possible parkinsons Peripheral neuropathy  The hypoactive delirium is driving factor however rising BUN not helping, also think fatigue plays a role Plan Postoperative management per NSGY AEDs as ordered (Tegretol ) Continue Sinemet  Holding Seroquel , was on melatonin this was also stopped  Neuroprotective measures: HOB > 30 degrees, normoglycemia, normothermia, electrolytes WNL PT/OT/SLP Will try some low-dose Provigil during daytime hours to see if this improves his alertness  H/o HTN and HLD BP had been boarderline low.  Plan Hold lopressor  to ensure adequate MAP Cont midodrine (started 6/4), if SBP remaining over 120 on average over next 24 hrs dc Continue statin   CKD stage 3a BPH - got lasix and albumin 6/3, scr bumped a little and continued to climb as of 6/5. Gave fluid challenge and IVFs yesterday and overnight scr better. BUN down some Plan Ensure MAP > 65 Ok to dc IVFs but need to keep close eye on  I&Os Renal dose meds Am chem  Would be careful about diuresis    Fluid and Electrolyte imbalance: hypernatremia following diuresis  -Na up a little. Plan Change FWR to 200 q4 Am chem   DM2 w hyperglycemia  - better glycemic control after adjustment of TF coverage  Plan Cont Basal Semglee 10U daily TF coverage (5 units q 4) + SSI for Goal CBG 140-180  Moderate Protein Calorie Malnutrition 2/2 critical illness - RD/Nutrition consulted - Cortrak in place, may have had aspiration event 6/5 w/ speech.  Plan tubefeeds Would continue NPO status  L heel ulcer POA Seen by podiatry at [facility] on 5/22 s/p debridement - WOC consulted, 6/2 here Plan Apply sacral foam dressing, change every 3 days or PRN.   Chronic normocytic anemia  - hgb down 6.9 on 6/4, got a unit of blood. Post xfusion 8.1 stable at 7.9 this am  Plan Trigger remains <7 or for active bleed and hemodynamic compromise  Ck FOB from stool if pos would change to PPI   Best Practice: (right click and "Reselect all SmartList Selections" daily)   Diet/type: tubefeeds DVT prophylaxis SCD and LMWH Pressure ulcer(s): pressure ulcer assessment deferred  GI prophylaxis: H2B Lines: N/A Foley:  N/A Code Status:  limited DNR status - DNR, but pre arrest interventions including intubation ok. Confirmed again 6/1 Last date of  multidisciplinary goals of care discussion [6/1]  My time is 38 minutes Signature:  Hadley Leu ACNP-BC [facility] Pulmonary/Critical Care Pager # [phone] OR # [phone] if no answer  My cct 34 min  Armstead Bertrand, NP [facility] 08/01/23 8:23 AM

## 2023-08-01 NOTE — Progress Notes (Signed)
 Physical Therapy Treatment Patient Details Name: Jack White MRN: [id number] DOB: [birth year] Today's Date: 08/01/2023   History of Present Illness Patient is an 83 y.o. male who presented to the ED via EMS 07/22/2023 as a code stroke after becoming unresponsive at home. He was intubated upon arrival. CT head showed large right holohemispheric acute SDH. Neurosurgery was consulted and patient underwent a  right craniotomy on 07/22/23. 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    PT Comments  Notified by RN that pt more alert this AM, however, more lethargic during PT session. Pt with intermittent eye opening to name call, but not following any commands. Pt requiring totalA for bed mobility (rolling to R/L). Maxisky tranfser performed from bed to chair to promote upright, increase arousal and improve respiratory function. BP sitting in chair 101/46 (63). Will continue to progress as tolerated.    If plan is discharge home, recommend the following: Two people to help with walking and/or transfers;Two people to help with bathing/dressing/bathroom   Can travel by private vehicle     No  Equipment Recommendations  Other (comment) (defer)    Recommendations for Other Services       Precautions / Restrictions Precautions Precautions: Fall Recall of Precautions/Restrictions: Impaired Restrictions Weight Bearing Restrictions Per Provider Order: No     Mobility  Bed Mobility Overal bed mobility: Needs Assistance Bed Mobility: Rolling Rolling: Total assist         General bed mobility comments: TotalA for rolling to R/L for peri care and placement of maxi sky lift pad    Transfers Overall transfer level: Needs assistance Equipment used: Ambulation equipment used Transfers: Bed to chair/wheelchair/BSC             General transfer comment: Maxisky bed to chair    Ambulation/Gait                   Stairs              Wheelchair Mobility     Tilt Bed    Modified Rankin (Stroke Patients Only)       Balance                                            Communication Communication Communication: Impaired Factors Affecting Communication: Hearing impaired  Cognition Arousal: Lethargic Behavior During Therapy: Flat affect   PT - Cognitive impairments: Difficult to assess Difficult to assess due to: Level of arousal                     PT - Cognition Comments: Pt opening eyes intermittently to name call, but otherwise no command following Following commands: Impaired      Cueing Cueing Techniques: Verbal cues, Tactile cues, Visual cues  Exercises General Exercises - Lower Extremity Ankle Circles/Pumps: PROM, Both, 10 reps, Seated Long Arc Quad: PROM, Both, 10 reps, Seated Other Exercises Other Exercises: Sitting: cervical extension, rotation stretches    General Comments        Pertinent Vitals/Pain Pain Assessment Pain Assessment: Faces Faces Pain Scale: Hurts a little bit Pain Location: Moaning with positional changes Pain Descriptors / Indicators: Moaning Pain Intervention(s): Monitored during session    Home Living  Prior Function            PT Goals (current goals can now be found in the care plan section) Acute Rehab PT Goals Patient Stated Goal: pt daughter agreeable to rehab Potential to Achieve Goals: Fair Progress towards PT goals: Not progressing toward goals - comment    Frequency    Min 2X/week      PT Plan      Co-evaluation              AM-PAC PT "6 Clicks" Mobility   Outcome Measure  Help needed turning from your back to your side while in a flat bed without using bedrails?: Total Help needed moving from lying on your back to sitting on the side of a flat bed without using bedrails?: Total Help needed moving to and from a bed to a chair (including a wheelchair)?: Total Help  needed standing up from a chair using your arms (e.g., wheelchair or bedside chair)?: Total Help needed to walk in hospital room?: Total Help needed climbing 3-5 steps with a railing? : Total 6 Click Score: 6    End of Session   Activity Tolerance: Patient tolerated treatment well Patient left: with call bell/phone within reach;in chair Nurse Communication: Mobility status;Need for lift equipment PT Visit Diagnosis: Unsteadiness on feet (R26.81);History of falling (Z91.81);Difficulty in walking, not elsewhere classified (R26.2);Pain     Time: 1610-9604 PT Time Calculation (min) (ACUTE ONLY): 33 min  Charges:    $Therapeutic Activity: 23-37 mins PT General Charges $$ ACUTE PT VISIT: 1 Visit                     Verdia Glad, PT, DPT Acute Rehabilitation Services Office [phone]    Claria Crofts 08/01/2023, 9:45 AM

## 2023-08-01 NOTE — Progress Notes (Signed)
 SLP Cancellation Note  Patient Details Name: Jack White MRN: [id number] DOB: 01-May-1940   Cancelled treatment:       Reason Eval/Treat Not Completed: Fatigue/lethargy limiting ability to participate. MBS had been scheduled today, attempting to time it around medications and other therapy to try to evaluate when most alert. Unfortunately, pt was still too lethargic so he was not sent for the study. Until level of alertness improves more consistently, it is likely to be hard to complete a study but that also makes it less likely that he is ready to take many POs. Will continue to follow and coordinate with nursing to get a better evaluation as able.     Beth Brooke., M.A. CCC-SLP Acute Rehabilitation Services Office: [phone]  Secure chat preferred  08/01/2023, 12:20 PM

## 2023-08-01 NOTE — Progress Notes (Signed)
 Speech Language Pathology Note:     Messaged received from RN that pt had some wheezing last night after taking a few sips of water, asking for SLP to reassess. Threshold is low for further testing given PNA, overall respiratory status, and decreased functional reserve. Would hold on further POs pending readiness to participate in instrumental swallow study.  RN says that pt is more alert this morning and able to come to radiology for MBS. Would keep NPO and plan for this, tentatively scheduled for later today with radiology if he can maintain level of alertness.     Beth Brooke., M.A. CCC-SLP Acute Rehabilitation Services Office: [phone]  Secure chat preferred  08/01/2023, 8:53 AM

## 2023-08-02 DIAGNOSIS — J690 Pneumonitis due to inhalation of food and vomit: Secondary | ICD-10-CM

## 2023-08-02 DIAGNOSIS — J90 Pleural effusion, not elsewhere classified: Secondary | ICD-10-CM | POA: Diagnosis not present

## 2023-08-02 DIAGNOSIS — N1831 Chronic kidney disease, stage 3a: Secondary | ICD-10-CM | POA: Diagnosis not present

## 2023-08-02 DIAGNOSIS — R1310 Dysphagia, unspecified: Secondary | ICD-10-CM | POA: Diagnosis not present

## 2023-08-02 LAB — BASIC METABOLIC PANEL WITH GFR
Anion gap: 6 (ref 5–15)
BUN: 79 mg/dL — ABNORMAL HIGH (ref 8–23)
CO2: 26 mmol/L (ref 22–32)
Calcium: 7.7 mg/dL — ABNORMAL LOW (ref 8.9–10.3)
Chloride: 109 mmol/L (ref 98–111)
Creatinine, Ser: 1.88 mg/dL — ABNORMAL HIGH (ref 0.61–1.24)
GFR, Estimated: 35 mL/min — ABNORMAL LOW (ref >60)
Glucose, Bld: 163 mg/dL — ABNORMAL HIGH (ref 70–99)
Potassium: 5.1 mmol/L (ref 3.5–5.1)
Sodium: 141 mmol/L (ref 135–145)

## 2023-08-02 LAB — GLUCOSE, CAPILLARY
Glucose-Capillary: 113 mg/dL — ABNORMAL HIGH (ref 70–99)
Glucose-Capillary: 127 mg/dL — ABNORMAL HIGH (ref 70–99)
Glucose-Capillary: 139 mg/dL — ABNORMAL HIGH (ref 70–99)
Glucose-Capillary: 143 mg/dL — ABNORMAL HIGH (ref 70–99)
Glucose-Capillary: 145 mg/dL — ABNORMAL HIGH (ref 70–99)
Glucose-Capillary: 152 mg/dL — ABNORMAL HIGH (ref 70–99)

## 2023-08-02 MED ORDER — MELATONIN 5 MG PO TABS
5.0000 mg | ORAL_TABLET | Freq: Every day | ORAL | Status: DC
  Administered 2023-08-02 – 2023-08-03 (×2): 5 mg via ORAL
  Filled 2023-08-02 (×2): qty 1

## 2023-08-02 NOTE — Progress Notes (Signed)
 eLink Physician-Brief Progress Note Patient Name: Jack White DOB: 09-Jan-1941 MRN: [id number]   Date of Service  08/02/2023  HPI/Events of Note  Elink hand off: airway watch.  Camera: Resting.NG tube. HR 87, sats 97%.   eICU Interventions  Continue care     Intervention Category Intermediate Interventions: Other:  Rexann Catalan 08/02/2023, 10:20 PM

## 2023-08-02 NOTE — Progress Notes (Addendum)
 NAME:  Jack White, MRN:  [id number], DOB:  1940/08/20, LOS: 11a ADMISSION DATE:  07/21/2023, CONSULTATION DATE:  07/27/2023 REFERRING MD: Michale Age - NSGY, CHIEF COMPLAINT:  AMS   History of Present Illness:  Pt encephalopathic, therefore HPI obtained from EMR and from pt's daughter at bedside.   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  Admitted to NSGY and taken for emergent right craniotomy.  Was seen by PCCM while in Neuro ICU, extubated 5/27 evening.  ICU stay complicated by hypoactive delirium, cortrak placed for meds/ nutrition.  pt transferred out of ICU 5/30, CTH unchanged 5/31.  Started to develop increased fever curve.  On 6/1 PCCM re-consulted for development on hypoxia, previously on room air with upper airway congestion.  Daughter reports has not been coughing and clearing secretions. 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 Intubated for airway protection. NSG taking to OR. Pccm consulted > extubated by afternoon 5/28 intermittent episodes of agitation/confusion with difficulty sleeping overnight 5/29 difficulty with restlessness overnight, sleeping poorly on a.m. assessment 5/30  repeat CTH stable, tx out of ICU 6/1 PCCM re-consult for hypoxia 6/3 mproved alertness this AM, following commands for RN (thumbs up, wiggling toes, stating name)Fatigues quickly, but more awake than prior CXR with low lung volumes, +bronchovascular crowding, diffuse interstitial/patchy opacities (edema vs. Multifocal PNA)BPs borderline, MAPs low-mid 70s 6/4 hgb down 6.9, 1 unit blood. Na and scr up after lasix  day prior.  6/5 scr still rising. Na improved w/ Free water . IVC on POCUS collapsible w/ marked resp variation so gave IVFs.  Concerned about possible aspiration event following swallow eval.  During morning exam quite good developed worsening wheezing about an hour after p.o. intake 6/6: Creatinine improved following volume resuscitation.  Still on 2 L.  Mental status still waxing and waning.  IV fluids stopped.  Starting Provigil   Interim History / Subjective:   Offers no complaints. Daughter at bedside Afebrile On room air   Objective:    Blood pressure (!) 155/61, pulse 89, temperature 98.3 F (36.8 C), temperature source Axillary, resp. rate (!) 22, height 6' (1.829 m), weight 107.9 kg, SpO2 99%.        Intake/Output Summary (Last 24 hours) at 08/02/2023 1123 Last data filed at 08/02/2023 1100 Gross per 24 hour  Intake 2800 ml  Output 2150 ml  Net 650 ml   Filed Weights   07/31/23 0500 08/01/23 0500 08/02/23 0407  Weight: 104.3 kg 106.7 kg 107.9 kg   Physical Examination: General elderly man, out of bed to chair HEENT normocephalic atraumatic mucous membranes are moist crani staples are clean dry and intact pupils equal reactive Pulmonary decreased breath sounds on right, no accessory muscle  use Cardiac: Regular rate and rhythm Abdomen: Soft nontender Extremities: Warm dry brisk cap refill, multiple areas of ecchymosis with mild dependent edema Neuro slow to arouse, he typically waxes and wanes.  Weak on left, poor daughter was more interactive earlier GU clear  yellow  Labs show mild hyperkalemia 4.1, BUN/creatinine 79/1.9 Resolved problem List:  Acute hypoxic resp failure  Sepsis  Assessment and Plan:  aspiration PNA Small bilateral pleural effusions Dysphagia Completed unasyn  5/5. Remains high risk for aspiration  Plan Cont pulse ox goal sats > 92% Aspiration precautions (would keep NPO) May have to defer swallow evaluation until he is more awake Pulmonary hygiene including HTS nebs TID, chest PT, IS/flutter (when able), NTS PRN   Traumatic SDH w brain compression S/p crani 5/27   Plan Postoperative management per NSGY AEDs as ordered (Tegretol ) Neuroprotective measures: HOB > 30 degrees, normoglycemia, normothermia, electrolytes WNL PT/OT/SLP  Hypoactive delirium Sleep disturbance  Possible parkinsons Peripheral neuropathy  Continue Sinemet  Holding Seroquel ,  Resume melatonin to improve circadian rhythm Trying low-dose Provigil  Frequent reorientation, PT as tolerated  H/o HTN and HLD  Plan Hold lopressor  to ensure adequate MAP DC midodrine  Continue statin   CKD stage 3a BPH - got lasix  and albumin  6/3, scr bumped a little and continued to climb as of 6/5. Gave fluid challenge and IVFs yesterday and overnight scr better. BUN down some Plan Recheck for hyperkalemia tomorrow Renal dose meds      DM2 w hyperglycemia  - better glycemic control after adjustment of TF coverage  Plan Cont Basal Semglee  10U daily TF coverage (5 units q 4) + SSI for Goal CBG 140-180  Moderate Protein Calorie Malnutrition 2/2 critical illness - RD/Nutrition consulted - Cortrak in place, may have had aspiration event 6/5 w/ speech.  Plan tubefeeds Would continue NPO status  L heel ulcer POA Seen by podiatry at [facility] on 5/22 s/p debridement - WOC consulted, 6/2 here Plan Apply sacral foam dressing, change every 3 days or PRN.   Chronic normocytic anemia  - hgb down 6.9 on 6/4, got a unit of blood. Post xfusion 8.1 stable at 7.9  this am  Plan Trigger remains <7 or for active bleed and hemodynamic compromise  Ck FOB from stool if pos would change to PPI  Daughter updated at bedside   Best Practice: (right click and "Reselect all SmartList Selections" daily)   Diet/type: tubefeeds DVT prophylaxis SCD and LMWH Pressure ulcer(s): pressure ulcer assessment deferred  GI prophylaxis: H2B Lines: N/A Foley:  N/A Code Status:  limited DNR status - DNR, but pre arrest interventions including intubation ok. Confirmed again 6/1 Last date of multidisciplinary goals of care discussion [6/1]    Signature:    My independent critical care time was 31 minutes  Mazi Schuff V. Villa Greaser, MD [facility] 08/02/23 11:23 AM

## 2023-08-02 NOTE — Progress Notes (Signed)
 Speech Language Pathology Treatment: Dysphagia  Patient Details Name: Jack White MRN: [id number] DOB: 01/09/1941 Today's Date: 08/02/2023 Time: 3474-2595 SLP Time Calculation (min) (ACUTE ONLY): 35 min  Assessment / Plan / Recommendation Clinical Impression  SLP followed up for ongoing dysphagia management. Pts family eager for swallowing intervention. Pt upright in chair with daughter present. SLP provided education on importance of pt oral care and risk factors for aspiration PNA. PTs daughter very supportive. Pt with extensive dried saliva dispersed throughout oral cavity (palatal, hypopharynx, lingual areas, and buccal regions). SLP provided diligent oral care removing dried secretions. Trialed single ice chips. Pt required verbal cues for more timely oral propulsion of bolus and exhibited suspected swallow initiation per palpation which appeared delayed. Vocal quality remained clear during PO trials when SLP cued for pt to vocalize. Substantial benefits noted in oral hygiene with improved moisture and cleanliness of mouth post oral care and ice chips. Recommend continue single ice chips following diligent oral care with trained caregiver and staff when pt mentation allows. Will plan for future instrumental swallow assessment when pt is able to show adequate attention for longer periods of time.    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  Admitted to NSGY and taken for emergent right craniotomy.  Was seen by PCCM while in Neuro ICU, extubated 5/27 evening.  ICU stay complicated by hypoactive delirium, cortrak placed for meds/ nutrition.  pt transferred out of ICU 5/30, CTH unchanged 5/31.  Started to develop increased fever  curve.  On 6/1 PCCM re-consulted for development on hypoxia, previously on room air with upper airway congestion.  Daughter reports has not been coughing and clearing secretion.      SLP Plan  Continue with current plan of care          Recommendations  Diet recommendations: NPO (single ice chips as tolerated following diligent oral care with staff or trained caregiver) Liquids provided via: Teaspoon Medication Administration: Via alternative means Supervision: Full supervision/cueing for compensatory strategies;Trained caregiver to feed patient;Staff to assist with self feeding Compensations: Slow rate;Small sips/bites Postural Changes and/or Swallow Maneuvers: Seated upright 90 degrees                  Oral care QID;Oral care prior to ice chip/H20   None Dysphagia, unspecified (R13.10)     Continue with current plan of care     Herschell Virani H. MA, CCC-SLP Acute Rehabilitation Services    08/02/2023, 12:00 PM

## 2023-08-03 DIAGNOSIS — J90 Pleural effusion, not elsewhere classified: Secondary | ICD-10-CM | POA: Diagnosis not present

## 2023-08-03 DIAGNOSIS — J690 Pneumonitis due to inhalation of food and vomit: Secondary | ICD-10-CM | POA: Diagnosis not present

## 2023-08-03 DIAGNOSIS — R1310 Dysphagia, unspecified: Secondary | ICD-10-CM | POA: Diagnosis not present

## 2023-08-03 DIAGNOSIS — N1831 Chronic kidney disease, stage 3a: Secondary | ICD-10-CM | POA: Diagnosis not present

## 2023-08-03 LAB — GLUCOSE, CAPILLARY
Glucose-Capillary: 113 mg/dL — ABNORMAL HIGH (ref 70–99)
Glucose-Capillary: 151 mg/dL — ABNORMAL HIGH (ref 70–99)
Glucose-Capillary: 157 mg/dL — ABNORMAL HIGH (ref 70–99)
Glucose-Capillary: 157 mg/dL — ABNORMAL HIGH (ref 70–99)
Glucose-Capillary: 198 mg/dL — ABNORMAL HIGH (ref 70–99)
Glucose-Capillary: 86 mg/dL (ref 70–99)

## 2023-08-03 LAB — CBC WITH DIFFERENTIAL/PLATELET
Abs Immature Granulocytes: 0.02 10*3/uL (ref 0.00–0.07)
Basophils Absolute: 0 10*3/uL (ref 0.0–0.1)
Basophils Relative: 0 %
Eosinophils Absolute: 0.6 10*3/uL — ABNORMAL HIGH (ref 0.0–0.5)
Eosinophils Relative: 10 %
HCT: 26.2 % — ABNORMAL LOW (ref 39.0–52.0)
Hemoglobin: 8.1 g/dL — ABNORMAL LOW (ref 13.0–17.0)
Immature Granulocytes: 0 %
Lymphocytes Relative: 16 %
Lymphs Abs: 0.9 10*3/uL (ref 0.7–4.0)
MCH: 30.7 pg (ref 26.0–34.0)
MCHC: 30.9 g/dL (ref 30.0–36.0)
MCV: 99.2 fL (ref 80.0–100.0)
Monocytes Absolute: 0.6 10*3/uL (ref 0.1–1.0)
Monocytes Relative: 10 %
Neutro Abs: 3.7 10*3/uL (ref 1.7–7.7)
Neutrophils Relative %: 64 %
Platelets: 197 10*3/uL (ref 150–400)
RBC: 2.64 MIL/uL — ABNORMAL LOW (ref 4.22–5.81)
RDW: 14.9 % (ref 11.5–15.5)
WBC: 5.8 10*3/uL (ref 4.0–10.5)
nRBC: 0 % (ref 0.0–0.2)

## 2023-08-03 LAB — BASIC METABOLIC PANEL WITH GFR
Anion gap: 5 (ref 5–15)
BUN: 77 mg/dL — ABNORMAL HIGH (ref 8–23)
CO2: 26 mmol/L (ref 22–32)
Calcium: 8.1 mg/dL — ABNORMAL LOW (ref 8.9–10.3)
Chloride: 111 mmol/L (ref 98–111)
Creatinine, Ser: 1.73 mg/dL — ABNORMAL HIGH (ref 0.61–1.24)
GFR, Estimated: 38 mL/min — ABNORMAL LOW (ref >60)
Glucose, Bld: 124 mg/dL — ABNORMAL HIGH (ref 70–99)
Potassium: 5.2 mmol/L — ABNORMAL HIGH (ref 3.5–5.1)
Sodium: 142 mmol/L (ref 135–145)

## 2023-08-03 MED ORDER — MORPHINE SULFATE (PF) 2 MG/ML IV SOLN
2.0000 mg | INTRAVENOUS | Status: AC | PRN
  Administered 2023-08-03 – 2023-08-07 (×2): 2 mg via INTRAVENOUS
  Filled 2023-08-03 (×2): qty 1

## 2023-08-03 MED ORDER — FUROSEMIDE 10 MG/ML IJ SOLN
40.0000 mg | Freq: Once | INTRAMUSCULAR | Status: AC
  Administered 2023-08-03: 40 mg via INTRAVENOUS
  Filled 2023-08-03: qty 4

## 2023-08-03 NOTE — Progress Notes (Signed)
 eLink Physician-Brief Progress Note Patient Name: Jack White DOB: 1940/05/07 MRN: [id number]   Date of Service  08/03/2023  HPI/Events of Note  Whining and moaning in pain. All over. Only has scheduled tylenol . Meds per tube  eICU Interventions  Ordered Morphine  2 mg IV x 2 doses Hopefully once pain addressed and patient gets some rest his delirium will improve as well        Turner Gains 08/03/2023, 11:53 PM

## 2023-08-03 NOTE — Progress Notes (Signed)
 NAME:  ELLINGTON GREENSLADE, MRN:  [id number], DOB:  [birth year], LOS: 12a ADMISSION DATE:  07/21/2023, CONSULTATION DATE:  07/27/2023 REFERRING MD: Michale Age - NSGY, CHIEF COMPLAINT:  AMS   History of Present Illness:  Pt encephalopathic, therefore HPI obtained from EMR and from pt's daughter at bedside.   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  Admitted to NSGY and taken for emergent right craniotomy.  Was seen by PCCM while in Neuro ICU, extubated 5/27 evening.  ICU stay complicated by hypoactive delirium, cortrak placed for meds/ nutrition.  pt transferred out of ICU 5/30, CTH unchanged 5/31.  Started to develop increased fever curve.  On 6/1 PCCM re-consulted for development on hypoxia, previously on room air with upper airway congestion.  Daughter reports has not been coughing and clearing secretions. 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 Intubated for airway protection. NSG taking to OR. Pccm consulted > extubated by afternoon 5/28 intermittent episodes of agitation/confusion with difficulty sleeping overnight 5/29 difficulty with restlessness overnight, sleeping poorly on a.m. assessment 5/30  repeat CTH stable, tx out of ICU 6/1 PCCM re-consult for hypoxia 6/3 mproved alertness this AM, following commands for RN (thumbs up, wiggling toes, stating name)Fatigues quickly, but more awake than prior CXR with low lung volumes, +bronchovascular crowding, diffuse interstitial/patchy opacities (edema vs. Multifocal PNA)BPs borderline, MAPs low-mid 70s 6/4 hgb down 6.9, 1 unit blood. Na and scr up after lasix  day prior.  6/5 scr still rising. Na improved w/ Free water . IVC on POCUS collapsible w/ marked resp variation so gave IVFs.  Concerned about possible aspiration event following swallow eval.  During morning exam quite good developed worsening wheezing about an hour after p.o. intake 6/6: Creatinine improved following volume resuscitation.  Still on 2 L.  Mental status still waxing and waning.  IV fluids stopped.  Starting Provigil   Interim History / Subjective:   More awake this morning Denies chest pain or dyspnea Afebrile   Objective:    Blood pressure (!) 123/49, pulse (!) 170, temperature 98 F (36.7 C), temperature source Axillary, resp. rate 20, height 6' (1.829 m), weight 102.5 kg, SpO2 (!) 74%.        Intake/Output Summary (Last 24 hours) at 08/03/2023 1216 Last data filed at 08/03/2023 1000 Gross per 24 hour  Intake 2100 ml  Output 1250 ml  Net 850 ml   Filed Weights   08/01/23 0500 08/02/23 0407 08/03/23 0500  Weight: 106.7 kg 107.9 kg 102.5 kg   Physical Examination: General elderly man, out of bed to chair HEENT normocephalic atraumatic mucous membranes are moist crani staples are clean dry and intact pupils equal reactive Pulmonary faint expiratory rhonchi, no accessory muscle use  Cardiac: Regular rate and rhythm Abdomen: Soft nontender Extremities: Warm dry brisk cap refill, multiple areas of ecchymosis with mild dependent edema Neuro more awake, follows commands, shakes my hand and able to interact GU clear yellow  Labs show mild hyperkalemia 4.1,  BUN/creatinine 77/1.7, no leukocytosis, stable anemia Resolved problem List:  Acute hypoxic resp failure  Sepsis  Assessment and Plan:  aspiration PNA Small bilateral pleural effusions Dysphagia Completed unasyn  5/5. Remains high risk for aspiration  Plan Cont pulse ox goal sats > 92% Aspiration precautions (would keep NPO) Pulmonary hygiene including HTS nebs TID, chest PT, IS/flutter (when able), NTS PRN   Traumatic SDH w brain compression S/p crani 5/27   Plan Postoperative management per NSGY AEDs as ordered (Tegretol ) Neuroprotective measures: HOB > 30 degrees, normoglycemia, normothermia, electrolytes WNL PT/OT/SLP  Hypoactive delirium Sleep disturbance  Possible parkinsons Peripheral neuropathy  Continue Sinemet  Holding Seroquel ,  Resume melatonin to improve circadian rhythm Trying low-dose Provigil  Frequent reorientation, PT as tolerated  H/o HTN and HLD  Plan Hold lopressor  to ensure adequate MAP DC midodrine  Continue statin   CKD stage 3a BPH - got lasix  and albumin  6/3, scr bumped a little and continued to climb as of 6/5. Gave fluid challenge and IVFs yesterday and overnight scr better. BUN down some Plan Recheck for hyperkalemia tomorrow Renal dose meds Lasix  40 x 1      DM2 w hyperglycemia  - better glycemic control after adjustment of TF coverage  Plan Cont Basal Semglee  10U daily TF coverage (5 units q 4) + SSI for Goal CBG 140-180  Moderate Protein Calorie Malnutrition 2/2 critical illness - RD/Nutrition consulted - Cortrak in place, may have had aspiration event 6/5 w/ speech.  Plan Tubefeeds Proceed with swallow evaluation   L heel ulcer POA Seen by podiatry at [facility] on 5/22 s/p debridement - WOC consulted, 6/2 here Plan Apply sacral foam dressing, change every 3 days or PRN.   Chronic normocytic anemia  - hgb down 6.9 on 6/4, got a unit of blood.  Plan Trigger remains <7 or for active bleed and hemodynamic compromise     Daughter updated at bedside, at her insistence we will keep him in the ICU 1 more day, he is gradually improving and she is thankful for excellent nursing care   Best Practice: (right click and "Reselect all SmartList Selections" daily)   Diet/type: tubefeeds DVT prophylaxis SCD and LMWH Pressure ulcer(s): pressure ulcer assessment deferred  GI prophylaxis: H2B Lines: N/A Foley:  N/A Code Status:  limited DNR status - DNR, but pre arrest interventions including intubation ok. Confirmed again 6/1 Last date of multidisciplinary goals of care discussion [6/1]    Signature:      Richardo Popoff V. Villa Greaser, MD [facility] 08/03/23 12:16 PM

## 2023-08-04 DIAGNOSIS — Z9889 Other specified postprocedural states: Secondary | ICD-10-CM | POA: Diagnosis not present

## 2023-08-04 LAB — BASIC METABOLIC PANEL WITH GFR
Anion gap: 7 (ref 5–15)
BUN: 82 mg/dL — ABNORMAL HIGH (ref 8–23)
CO2: 20 mmol/L — ABNORMAL LOW (ref 22–32)
Calcium: 8.3 mg/dL — ABNORMAL LOW (ref 8.9–10.3)
Chloride: 116 mmol/L — ABNORMAL HIGH (ref 98–111)
Creatinine, Ser: 1.94 mg/dL — ABNORMAL HIGH (ref 0.61–1.24)
GFR, Estimated: 34 mL/min — ABNORMAL LOW (ref >60)
Glucose, Bld: 126 mg/dL — ABNORMAL HIGH (ref 70–99)
Potassium: 5.7 mmol/L — ABNORMAL HIGH (ref 3.5–5.1)
Sodium: 143 mmol/L (ref 135–145)

## 2023-08-04 LAB — GLUCOSE, CAPILLARY
Glucose-Capillary: 110 mg/dL — ABNORMAL HIGH (ref 70–99)
Glucose-Capillary: 110 mg/dL — ABNORMAL HIGH (ref 70–99)
Glucose-Capillary: 118 mg/dL — ABNORMAL HIGH (ref 70–99)
Glucose-Capillary: 121 mg/dL — ABNORMAL HIGH (ref 70–99)
Glucose-Capillary: 121 mg/dL — ABNORMAL HIGH (ref 70–99)
Glucose-Capillary: 163 mg/dL — ABNORMAL HIGH (ref 70–99)

## 2023-08-04 LAB — CBC WITH DIFFERENTIAL/PLATELET
Abs Immature Granulocytes: 0.02 10*3/uL (ref 0.00–0.07)
Basophils Absolute: 0 10*3/uL (ref 0.0–0.1)
Basophils Relative: 1 %
Eosinophils Absolute: 0.6 10*3/uL — ABNORMAL HIGH (ref 0.0–0.5)
Eosinophils Relative: 10 %
HCT: 26.3 % — ABNORMAL LOW (ref 39.0–52.0)
Hemoglobin: 8.1 g/dL — ABNORMAL LOW (ref 13.0–17.0)
Immature Granulocytes: 0 %
Lymphocytes Relative: 19 %
Lymphs Abs: 1.2 10*3/uL (ref 0.7–4.0)
MCH: 31.4 pg (ref 26.0–34.0)
MCHC: 30.8 g/dL (ref 30.0–36.0)
MCV: 101.9 fL — ABNORMAL HIGH (ref 80.0–100.0)
Monocytes Absolute: 0.5 10*3/uL (ref 0.1–1.0)
Monocytes Relative: 9 %
Neutro Abs: 3.7 10*3/uL (ref 1.7–7.7)
Neutrophils Relative %: 61 %
Platelets: 220 10*3/uL (ref 150–400)
RBC: 2.58 MIL/uL — ABNORMAL LOW (ref 4.22–5.81)
RDW: 14.8 % (ref 11.5–15.5)
WBC: 6 10*3/uL (ref 4.0–10.5)
nRBC: 0.3 % — ABNORMAL HIGH (ref 0.0–0.2)

## 2023-08-04 LAB — MAGNESIUM: Magnesium: 3.1 mg/dL — ABNORMAL HIGH (ref 1.7–2.4)

## 2023-08-04 LAB — PHOSPHORUS: Phosphorus: 4.7 mg/dL — ABNORMAL HIGH (ref 2.5–4.6)

## 2023-08-04 MED ORDER — MELATONIN 5 MG PO TABS
10.0000 mg | ORAL_TABLET | Freq: Every day | ORAL | Status: DC
  Administered 2023-08-04 – 2023-08-13 (×9): 10 mg via ORAL
  Filled 2023-08-04 (×10): qty 2

## 2023-08-04 MED ORDER — ALUM & MAG HYDROXIDE-SIMETH 200-200-20 MG/5ML PO SUSP
30.0000 mL | Freq: Four times a day (QID) | ORAL | Status: DC | PRN
  Administered 2023-08-06: 30 mL
  Filled 2023-08-04 (×2): qty 30

## 2023-08-04 MED ORDER — MODAFINIL 100 MG PO TABS
100.0000 mg | ORAL_TABLET | Freq: Every day | ORAL | Status: DC
  Administered 2023-08-05 – 2023-08-10 (×6): 100 mg
  Filled 2023-08-04 (×7): qty 1

## 2023-08-04 MED ORDER — SODIUM ZIRCONIUM CYCLOSILICATE 10 G PO PACK
10.0000 g | PACK | Freq: Once | ORAL | Status: AC
  Administered 2023-08-04: 10 g
  Filled 2023-08-04: qty 1

## 2023-08-04 MED ORDER — TRAMADOL HCL 50 MG PO TABS
50.0000 mg | ORAL_TABLET | Freq: Three times a day (TID) | ORAL | Status: DC | PRN
  Administered 2023-08-04 – 2023-08-09 (×6): 50 mg
  Filled 2023-08-04 (×8): qty 1

## 2023-08-04 NOTE — Progress Notes (Addendum)
 Physical Therapy Treatment Patient Details Name: Jack White MRN: [id number] DOB: 1940/06/26 Today's Date: 08/04/2023   History of Present Illness Patient is an 83 y.o. male who presented to the ED via EMS 07/22/2023 as a code stroke after becoming unresponsive at home. He was intubated upon arrival. CT head showed large right holohemispheric acute SDH. Neurosurgery was consulted and patient underwent a  right craniotomy on 07/22/23. 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    PT Comments  Pt able to sustain alertness with verbal stimulation for about 20 minutes this session, which is a significant improvement. Still demonstrates decreased initiation with motor tasks and following commands. Pt requiring maximal assist for bed mobility and utilized Stedy for pre transfer training, but pt with significant difficulty achieving upright posture. Recommend maxi sky lift with nursing staff. Will continue to progress as tolerated.   If plan is discharge home, recommend the following: Two people to help with walking and/or transfers;Two people to help with bathing/dressing/bathroom   Can travel by private vehicle     No  Equipment Recommendations  Other (comment) (defer)    Recommendations for Other Services       Precautions / Restrictions Precautions Precautions: Fall Recall of Precautions/Restrictions: Impaired Restrictions Weight Bearing Restrictions Per Provider Order: No     Mobility  Bed Mobility Overal bed mobility: Needs Assistance Bed Mobility: Supine to Sit     Supine to sit: Max assist     General bed mobility comments: Assist to initiate bringing BLE's off edge of bed, trunk to upright, use of bed pad to scoot R hip out to edge of bed    Transfers Overall transfer level: Needs assistance Equipment used: Ambulation equipment used Transfers: Sit to/from Stand Sit to Stand: Max assist, +2 physical assistance            General transfer comment: Heavy maxA + 2 for sit to stand with Stedy, hand over hand guidance for reaching with bar with LUE, feet tend to slide anteriorly. Difficulty achieving upright posture despite max multimodal cueing Transfer via Lift Equipment: Stedy  Ambulation/Gait                   Stairs             Wheelchair Mobility     Tilt Bed    Modified Rankin (Stroke Patients Only) Modified Rankin (Stroke Patients Only) Pre-Morbid Rankin Score: Moderate disability Modified Rankin: Severe disability     Balance Overall balance assessment: Needs assistance Sitting-balance support: Feet supported Sitting balance-Leahy Scale: Poor Sitting balance - Comments: Reliant on RUE support, L lateral lean                                    Communication Communication Communication: Impaired Factors Affecting Communication: Hearing impaired  Cognition Arousal: Alert Behavior During Therapy: Flat affect   PT - Cognitive impairments: Orientation, Memory, Awareness, Attention, Initiation, Sequencing, Problem solving, Safety/Judgement                         Following commands: Impaired Following commands impaired: Follows one step commands inconsistently    Cueing Cueing Techniques: Verbal cues, Tactile cues, Visual cues  Exercises      General Comments        Pertinent Vitals/Pain Pain Assessment Pain Assessment: Faces Faces Pain Scale: Hurts little more Pain Location:  Moans with BLE movement Pain Descriptors / Indicators: Moaning Pain Intervention(s): Monitored during session    Home Living                          Prior Function            PT Goals (current goals can now be found in the care plan section) Acute Rehab PT Goals Patient Stated Goal: pt daughter agreeable to rehab Potential to Achieve Goals: Fair Progress towards PT goals: Progressing toward goals    Frequency    Min 2X/week      PT Plan       Co-evaluation              AM-PAC PT "6 Clicks" Mobility   Outcome Measure  Help needed turning from your back to your side while in a flat bed without using bedrails?: A Lot Help needed moving from lying on your back to sitting on the side of a flat bed without using bedrails?: A Lot Help needed moving to and from a bed to a chair (including a wheelchair)?: Total Help needed standing up from a chair using your arms (e.g., wheelchair or bedside chair)?: Total Help needed to walk in hospital room?: Total Help needed climbing 3-5 steps with a railing? : Total 6 Click Score: 8    End of Session Equipment Utilized During Treatment: Gait belt Activity Tolerance: Patient tolerated treatment well Patient left: in chair;with call bell/phone within reach;with chair alarm set Nurse Communication: Mobility status;Need for lift equipment PT Visit Diagnosis: Unsteadiness on feet (R26.81);History of falling (Z91.81);Difficulty in walking, not elsewhere classified (R26.2);Pain     Time: 1435-1459 PT Time Calculation (min) (ACUTE ONLY): 24 min  Charges:    $Therapeutic Activity: 23-37 mins PT General Charges $$ ACUTE PT VISIT: 1 Visit                     Verdia Glad, PT, DPT Acute Rehabilitation Services Office [phone]    Claria Crofts 08/04/2023, 4:20 PM

## 2023-08-04 NOTE — Progress Notes (Signed)
 Nutrition Follow-up  DOCUMENTATION CODES:   Non-severe (moderate) malnutrition in context of chronic illness  INTERVENTION:  Continue tube feeding via Cortrak tube: Osmolite 1.5 at 50 ml/hr (1200 ml per day) ProSource TF20 60 ml BID Provides 1960 kcal, 115 gm protein, 912 ml free water  daily 200 ml free water  every 4 hours Total free water : 2112 ml   NUTRITION DIAGNOSIS:   Moderate Malnutrition related to chronic illness as evidenced by moderate fat depletion, moderate muscle depletion. - Ongoing, being addressed via TF   GOAL:   Patient will meet greater than or equal to 90% of their needs - Met via TF  MONITOR:   TF tolerance  REASON FOR ASSESSMENT:   Consult Enteral/tube feeding initiation and management  ASSESSMENT:   Pt with PMH of HTN, HLD, DM, Parkinson's dz, trigeminal neuralgia, CKD 3, and L heel ulcer seen by podiatry at [facility] s/p debridement 5/22 now admitted 5/26 after a fall with large R acute SDH.   Pt discussed during ICU rounds and with RN and MD.  Eldora Greet with daughter at bedside. SLP had just completed their evaluation, remains NPO but plan for MBS 6/10.  Per MD notes plan for SNF at discharge.  Noted pt with hypernatremia following diuresis, FWF added   Pt more awake during my visit than previously.   5/27 - s/p R crani for evacuation of SDH 5/28 - s/p cortrak placement; tip gastric 5/30 - transferred out of ICU 6/01 - transferred back to ICU 6/03 - SLP eval, recommend NPO    Admission Weight: 105 kg (5/26) Current Weight: 101 kg (6/09)  Nutrition Related Medications: Sinemet  TID, Colace, Pepcid , Lasix , NovoLog  0-15 units q4h + 5 units q4h, Semglee  10 units daily, Miralax , Senokot, lokelma  x 1  Labs:  K 5.7 BUN 26 (07/21/23) ->77 -> 82 Cr 1.94 Phos 4.7 Mag 3.1 A1C 6.5  CBG: 110-163 mg/dL x 24 hrs   UOP: 161 mL x 24 hrs  Diet Order:   Diet Order             Diet NPO time specified Except for: Ice Chips  Diet effective now                   EDUCATION NEEDS:   Not appropriate for education at this time  Skin:  Skin Assessment: Skin Integrity Issues: Skin Integrity Issues:: Other (Comment) Other: L heel wound followed by [facility]  Last BM:  6/8 x 2 type 6  Height:  Ht Readings from Last 1 Encounters:  07/21/23 6' (1.829 m)   Weight:  Wt Readings from Last 1 Encounters:  08/04/23 101 kg   BMI:  Body mass index is 30.2 kg/m.  Estimated Nutritional Needs:  Kcal:  1900-2100 Protein:  110-125 grams Fluid:  >1.9 L/day  Randine Butcher., RD, LDN, CNSC See AMiON for contact information

## 2023-08-04 NOTE — Progress Notes (Addendum)
 PROGRESS NOTE    Jack White  UEA:[id number] DOB: 1940-09-06 DOA: 07/21/2023 PCP: Nestor Banter, MD    Brief Narrative:   Jack White is a 83 y.o. male [employer] veteran with past medical history significant for HTN, HLD, DM2, CKD stage IIIa, possible Parkinson's disease, trigeminal neuralgia, BPH who presented to [facility] on 07/22/2023 from home via EMS after mechanical fall with associated headache, left-sided weakness, nausea/vomiting.  Code stroke was initiated.  Last known normal 2130 same day as admission.  Not on anticoagulation or antiplatelets outpatient.  Workup in the ED was notable for a large right subdural hematoma with 1.6 mm right-to-left midline shift.  Neurosurgery was consulted and taken for emergent right craniotomy; and admitted to the neuro ICU under the neurosurgical service with PCCM consulting.  Significant hospital events: 5/27: admit by neurosurgery for SDH s/p craniotomy, PCCM consulted; extubated 5/28: Intermittent agitation/confusion, difficulty sleeping 5/29: Restlessness overnight, sleeping poorly on a.m. assessment 5/30: Repeat CT head stable, transferred out of ICU; Eval by CIR>does not meet level of care 6/1: CCM reconsulted for hypoxia, CT angiogram chest negative for PE, small bilateral pleural effusions, multifocal groundglass opacities bilateral upper lobes, right middle lobe concerning for infectious process 6/2: re-eval by CIR> continues to not meet level of care and signed off 6/3: Encephalopathy improved, following commands for RN (thumbs up, wiggling toes, stating name)Fatigues quickly, but more awake than prior  6/4: Hemoglobin down to 6.9, 1 unit PRBC ordered 6/5: Creatinine rising, sodium improved with free water , IVC on POCUS collapsible with marked respiratory variation, IV fluids given; concern for possible aspiration event following swallow evaluation 6/6: Creatinine improved, stopped IV fluids; mental status waxing/waning; started on  Provigil  6/9: Transferred to TRH; pain overnight, given dose of morphine  by PCCM coverage; awaiting MBS by SLP  Assessment & Plan:   Traumatic subdural hematoma with midline shift s/p craniotomy Patient presenting to ED following mechanical fall with associated left-sided weakness, nausea/vomiting and confusion.  CT head with findings of a large right subdural hematoma with 1.6 mm right-to-left midline shift.  Neurosurgery was consulted and patient was taken emergently for right craniotomy.  Repeat CT head 5/28 with decreased right SDH, intracranial mass effect and left word midline shift, right subdural drain in place.  CT head 5/30 mild additional regression right SDH, decreasing leftward midline shift now 5 mm, right subdural drain removed.  CT head 5/31 residual subdural hematoma 1.5 cm in thickness with mass effect right cerebral hemisphere 5 to 6 mm right-to-left shift, smaller left-sided SDH without mass effect, not significant change from prior. -- Neuroprotective measures: HOB >30 degrees, normoglycemia, normothermia -- Continue therapy efforts; currently recommending SNF; TOC following -- Further per neurosurgery  Hyperkalemia Potassium 5.7 this morning. -- Lokelma  10 g per tube x 1 today -- Monitor on telemetry -- CMP in a.m.  Dysphagia -- SLP following, pending MBS -- Continue tube feeding via Cortrak tube: Osmolite 1.5 at 50 ml/hr (1200 ml per day), ProSource TF20 60 ml BID, 912 ml free water  daily -- Aspiration precautions -- If does not progress, will need PEG tube placement  Acute renal failure on CKD stage IIIa -- Cr 1.50>>1.37>>1.99>2.17>>1.73>1.94 (baseline 1.3 - 1.5) -- Avoid nephrotoxins, renal dose all medications  Hypoactive delirium -- Started on Provigil  by PCCM -- Delirium precautions -- Get up during the day (chair) -- Encourage a familiar face to remain present throughout the day -- Keep blinds open and lights on during daylight hours -- Minimize the use  of  opioids/benzodiazepines  Aspiration pneumonia On 07/27/2023, patient became hypoxic.  CT angiogram chest negative for PE but with small bilateral pleural effusions and multifocal groundglass opacities bilateral upper lobes, right middle lobe concerning for infectious process, likely aspiration.  Completed 5-day course of Unasyn  on 08/01/2023.  Oxygen has been weaned off. -- Aspirate precautions  Anemia -- Hgb 10.9>>6.5>>8.1>>6.9>>8.5>8.1>8.1, stable -- transfused 1u pRBC on 07/30/2023  HTN On metoprolol  succinate 25 mg p.o. daily at baseline. --BP 113/70, HR 75 today -- Hold home metoprolol  for now -- Continue BP closely  HLD -- Atorvastatin  10 mg per tube daily  DM2 Hemoglobin A1c 6.5%. -- Semglee  10 units [place] daily -- NovoLog  5 units [place] every 4 hours -- Moderate SSI for coverage -- CBG every 4 hours while on tube feeds  Parkinson's disease -- Sinemet  1 tablet 3 times daily  Trigeminal neuralgia Follow-up with neurology outpatient, Dr. Winferd Hatter; last seen 07/16/2023. -- Tegretol  level 5.7 on 07/30/2023 -- Continue Tegretol  200 mg per tube TiD  BPH -- Doxazosin  4 mg per tube daily  Insomnia Reports patient takes 10 mg of melatonin and 50 mg of Benadryl  nightly at home. -- Increase melatonin to 10 mg nightly today -- Hold Benadryl  for now  Left heel ulcer, POA Sacral/coccyx skin tear Seen by podiatry at [facility], underwent debridement on 5/22. -- Cleanse L heel wound with NS, apply Bacitracin  ointment to wound bed daily, cover with Telfa nonstick dressing and secure with silicone foam or Kerlix roll gauze whichever is preferred.  -- Apply sacral foam dressing, change every 3 days or PRN   Weakness/debility/deconditioning: -- Continue PT/OT efforts -- Will need SNF placement  DVT prophylaxis: enoxaparin  (LOVENOX ) injection 40 mg Start: 07/28/23 1445 SCDs Start: 07/22/23 0217 Place and maintain sequential compression device Start: 07/22/23 0059    Code Status: Do not attempt  resuscitation (DNR) PRE-ARREST INTERVENTIONS DESIRED Family Communication: Stated daughter extensively at bedside this morning  Disposition Plan:  Level of care: ICU Status is: Inpatient Remains inpatient appropriate because: Pending MBS, if does not progress will need PEG tube placement, will need SNF placement, TOC following    Consultants:  Neurosurgery PCCM: Signed off 6/9  Procedures:  Right frontal parietal craniotomy, evacuation SDH, Dr. Nat Badger 5/27 Extubation 5/27 Cortrak placement 5/28  Antimicrobials:  Unasyn  6/1 - 6/6   Subjective: Patient seen examined bedside, lying in bed.  Daughter present at bedside.  RN present.  Overnight patient received IV morphine  from PCCM coverage due to apparent "pain".  Daughter and RN unable to describe where his pain was at the time.  Patient has been more somnolent over the last few days per daughter, hopeful that his mental status will improve today to undergo MBS.  Discussed with daughter extensively that he may need PEG tube placement if does not progress as he has had a feeding tube via the nose and since 5/28.  Continues with poor mobility, requiring total assist for bed mobility.  Has been declined by CIR for admission on 2 occasions so far.  Will need SNF placement.  Daughter concerned regarding his respiratory status, although he is oxygen 100% on room air and not tachypneic.  Daughter also upset by bed not being at 30 degrees and patient not being able to reach his call bell overnight, although he apparently has been able to pass the call bell several times overnight.  Patient poorly interactive and unable to obtain much ROS from him this morning, but denies chest pain and no headache currently.  No  other acute concerns overnight per nursing staff.  Objective: Vitals:   08/04/23 0500 08/04/23 0617 08/04/23 0800 08/04/23 1000  BP:  113/70 (!) 119/50 (!) 102/41  Pulse:  75 75 78  Resp:  17 (!) 21 15  Temp:   98.2 F (36.8 C)    TempSrc:   Axillary   SpO2:  100% 100% 99%  Weight: 101 kg     Height:        Intake/Output Summary (Last 24 hours) at 08/04/2023 1057 Last data filed at 08/04/2023 1000 Gross per 24 hour  Intake 1400 ml  Output 900 ml  Net 500 ml   Filed Weights   08/02/23 0407 08/03/23 0500 08/04/23 0500  Weight: 107.9 kg 102.5 kg 101 kg    Examination:  Physical Exam: GEN: NAD, alert, chronically ill/weak in appearance HEENT: Right frontal craniotomy incision site noted with pupils 2 mm, equal round and reactive to light, EOMI, sclera clear, dry mucous membranes PULM: Auditory upper respiratory founds noted, otherwise lungs CTAB w/o wheezes/crackles, normal respiratory effort on room air CV: RRR w/o M/G/R GI: abd soft, NTND, + BS MSK: Trace-1+ bilateral lower extremity peripheral edema PSYCH: normal mood/affect Integumentary: Surgical craniotomy incision site noted with staples in place, well-approximated multiple areas of ecchymosis noted in various stages of healing, left heel wound, sacral/coccyx skin tear, otherwise no other concerning rashes/lesions/wounds noted on exposed skin surfaces.      Data Reviewed: I have personally reviewed following labs and imaging studies  CBC: Recent Labs  Lab 07/30/23 0507 07/30/23 1027 07/31/23 0548 08/01/23 0728 08/03/23 0454 08/04/23 0541  WBC 5.4  --  5.0 6.1 5.8 6.0  NEUTROABS  --   --   --   --  3.7 3.7  HGB 6.9* 8.1* 7.9* 8.5* 8.1* 8.1*  HCT 22.3* 25.8* 25.2* 27.6* 26.2* 26.3*  MCV 99.1  --  96.9 98.9 99.2 101.9*  PLT 169  --  179 206 197 220   Basic Metabolic Panel: Recent Labs  Lab 07/29/23 0759 07/30/23 0507 07/31/23 0548 08/01/23 0728 08/02/23 0438 08/03/23 0454 08/04/23 0541  NA 143 146* 144 147* 141 142 143  K 4.8 4.8 4.7 4.8 5.1 5.2* 5.7*  CL 109 106 109 112* 109 111 116*  CO2 27 28 27 26 26 26  20*  GLUCOSE 201* 153* 201* 99 163* 124* 126*  BUN 68* 85* 88* 82* 79* 77* 82*  CREATININE 1.80* 1.99* 2.17* 1.84* 1.88*  1.73* 1.94*  CALCIUM  7.7* 7.9* 7.8* 8.3* 7.7* 8.1* 8.3*  MG 2.9* 2.9*  --   --   --   --  3.1*  PHOS 5.0* 5.5*  --   --   --   --  4.7*   GFR: Estimated Creatinine Clearance: 34.9 mL/min (A) (by C-G formula based on SCr of 1.94 mg/dL (H)). Liver Function Tests: Recent Labs  Lab 07/29/23 0759  ALBUMIN  2.3*   No results for input(s): "LIPASE", "AMYLASE" in the last 168 hours. No results for input(s): "AMMONIA" in the last 168 hours. Coagulation Profile: No results for input(s): "INR", "PROTIME" in the last 168 hours. Cardiac Enzymes: No results for input(s): "CKTOTAL", "CKMB", "CKMBINDEX", "TROPONINI" in the last 168 hours. BNP (last 3 results) No results for input(s): "PROBNP" in the last 8760 hours. HbA1C: No results for input(s): "HGBA1C" in the last 72 hours. CBG: Recent Labs  Lab 08/03/23 1526 08/03/23 2003 08/03/23 2355 08/04/23 0348 08/04/23 0756  GLUCAP 157* 86 157* 121* 110*   Lipid Profile: No  results for input(s): "CHOL", "HDL", "LDLCALC", "TRIG", "CHOLHDL", "LDLDIRECT" in the last 72 hours. Thyroid  Function Tests: No results for input(s): "TSH", "T4TOTAL", "FREET4", "T3FREE", "THYROIDAB" in the last 72 hours. Anemia Panel: No results for input(s): "VITAMINB12", "FOLATE", "FERRITIN", "TIBC", "IRON", "RETICCTPCT" in the last 72 hours. Sepsis Labs: No results for input(s): "PROCALCITON", "LATICACIDVEN" in the last 168 hours.  Recent Results (from the past 240 hours)  Culture, blood (Routine X 2) w Reflex to ID Panel     Status: None   Collection Time: 07/27/23  6:52 PM   Specimen: BLOOD  Result Value Ref Range Status   Specimen Description BLOOD BLOOD LEFT ARM  Final   Special Requests   Final    AEROBIC BOTTLE ONLY Blood Culture results may not be optimal due to an inadequate volume of blood received in culture bottles   Culture   Final    NO GROWTH 5 DAYS Performed at [facility], [address] [place], [place] [address]    Report Status  08/01/2023 FINAL  Final  Culture, blood (Routine X 2) w Reflex to ID Panel     Status: None   Collection Time: 07/27/23  6:54 PM   Specimen: BLOOD  Result Value Ref Range Status   Specimen Description BLOOD BLOOD LEFT HAND  Final   Special Requests   Final    AEROBIC BOTTLE ONLY Blood Culture results may not be optimal due to an inadequate volume of blood received in culture bottles   Culture   Final    NO GROWTH 5 DAYS Performed at [facility], [address], [place], [place] [address]    Report Status 08/01/2023 FINAL  Final         Radiology Studies: No results found.      Scheduled Meds:  acetaminophen   650 mg Per Tube Q6H   atorvastatin   10 mg Per Tube QHS   bacitracin    Topical Daily   carbamazepine   200 mg Per Tube TID   carbidopa -levodopa   1 tablet Per Tube TID   Chlorhexidine  Gluconate Cloth  6 each Topical Daily   docusate  100 mg Per Tube BID   doxazosin   4 mg Per Tube Daily   enoxaparin  (LOVENOX ) injection  40 mg Subcutaneous Q24H   famotidine   20 mg Per Tube Daily   feeding supplement (PROSource TF20)  60 mL Per Tube BID   free water   200 mL Per Tube Q4H   insulin  aspart  0-15 Units Subcutaneous Q4H   insulin  aspart  5 Units Subcutaneous Q4H   insulin  glargine-yfgn  10 Units Subcutaneous Daily   melatonin  5 mg Oral QHS   modafinil   100 mg Per Tube Daily   montelukast   10 mg Per Tube QHS   mouth rinse  15 mL Mouth Rinse 4 times per day   polyethylene glycol  17 g Per Tube Daily   sennosides  5 mL Per Tube QHS   sodium zirconium cyclosilicate   10 g Per Tube Once   Continuous Infusions:  feeding supplement (OSMOLITE 1.5 CAL) 50 mL/hr at 08/04/23 1000     LOS: 13 days    Time spent: 56 minutes spent on 08/04/2023 caring for this patient face-to-face including chart review, ordering labs/tests, documenting, discussion with nursing staff, consultants, updating family and interview/physical exam    Rema [facility] [place], DO [facility] Available via Epic secure chat 7am-7pm After these hours, please refer to coverage provider listed on [email or web address] 08/04/2023,  10:57 AM

## 2023-08-04 NOTE — TOC Progression Note (Signed)
 Transition of Care [facility]) - Progression Note    Patient Details  Name: Jack White MRN: [id number] Date of Birth: 1940/03/16  Transition of Care [facility] CM/SW Contact  Carmon Christen, LCSWA Phone Number: 08/04/2023, 1:44 PM  Clinical Narrative:     CSW continues to follow. CSW will fax out for SNF closer to patient being medically stable.  Expected Discharge Plan: Skilled Nursing Facility Barriers to Discharge: Continued Medical Work up, English as a second language teacher, SNF Pending bed offer  Expected Discharge Plan and Services In-house Referral: Clinical Social Work Discharge Planning Services: [employer] Consult Post Acute Care Choice: Skilled Nursing Facility Living arrangements for the past 2 months: Mobile Home                                       Social Determinants of Health (SDOH) Interventions SDOH Screenings   Food Insecurity: No Food Insecurity (07/22/2023)  Housing: Low Risk  (07/22/2023)  Transportation Needs: No Transportation Needs (07/22/2023)  Utilities: Not At Risk (07/22/2023)  Financial Resource Strain: Patient Declined (02/07/2023)   Received from [facility]  Social Connections: Moderately Integrated (07/24/2023)  Tobacco Use: Low Risk  (07/22/2023)  Recent Concern: Tobacco Use - Medium Risk (07/17/2023)   Received from [facility]    Readmission Risk Interventions     No data to display

## 2023-08-04 NOTE — Progress Notes (Signed)
 Speech Language Pathology Treatment: Dysphagia  Patient Details Name: Jack White MRN: [id number] DOB: 06/18/1940 Today's Date: 08/04/2023 Time: 2841-3244 SLP Time Calculation (min) (ACUTE ONLY): 46 min  Assessment / Plan / Recommendation Clinical Impression  Pt is more alert this morning than he has been for previous SLP visits, still with fluctuating mentation that wanes as he fatigues but he was responding more to cueing from SLP and daughter, Jack White, to facilitate alertness and attention. He is opening his eyes a little more though, and trying to sing along to some music. When alert, he accepts POs and swallows them readily. No oral residue noted with small bites of puree, although he did need cueing to accept puree off the spoon at times. There was immediate throat clearing after a straw sip of water  but no overt coughing.   Education was given to Jack White about current level of function and that he now seems more appropriate for MBS if we can time it during a period of alertness. We discussed the timing of this, and given that he had gotten some morphine  overnight, and will have PT this afternoon, will aim to try for MBS on next date. Jack White plans to come to try to help with alertness and engagement. Until this can be completed, would still allow for ice chips after oral care and when alert, cueing him to ensure he chews before swallowing.    HPI HPI: 29 yoM with PMH as below, significant for HTN, HLD, DMT2, possible parkinsons disease, trigeminal neuralgia, CKD 3a, and BPH, who presented after mechanical fall on 5/27, not on AC or antiplatelets who developed N/V, AMS, with left sided weakness found to have large right SDH with 1.8m right to left MLS.  Admitted to NSGY and taken for emergent right craniotomy.  Was seen by PCCM while in Neuro ICU, extubated 5/27 evening.  ICU stay complicated by hypoactive delirium, cortrak placed for meds/ nutrition.  pt transferred out of ICU 5/30, CTH unchanged  5/31.  Started to develop increased fever curve.  On 6/1 PCCM re-consulted for development on hypoxia, previously on room air with upper airway congestion.  Daughter reports has not been coughing and clearing secretion.      SLP Plan  MBS          Recommendations  Diet recommendations: NPO;Other(comment) (ice chips after oral care when alert) Medication Administration: Via alternative means                  Oral care QID;Oral care prior to ice chip/H20     Dysphagia, unspecified (R13.10)     MBS     Beth Brooke., M.A. CCC-SLP Acute Rehabilitation Services Office: [phone]  Secure chat preferred   08/04/2023, 12:01 PM

## 2023-08-05 ENCOUNTER — Inpatient Hospital Stay (HOSPITAL_COMMUNITY)

## 2023-08-05 DIAGNOSIS — Z9889 Other specified postprocedural states: Secondary | ICD-10-CM | POA: Diagnosis not present

## 2023-08-05 LAB — COMPREHENSIVE METABOLIC PANEL WITH GFR
ALT: 6 U/L (ref 0–44)
AST: 17 U/L (ref 15–41)
Albumin: 2.4 g/dL — ABNORMAL LOW (ref 3.5–5.0)
Alkaline Phosphatase: 105 U/L (ref 38–126)
Anion gap: 8 (ref 5–15)
BUN: 77 mg/dL — ABNORMAL HIGH (ref 8–23)
CO2: 24 mmol/L (ref 22–32)
Calcium: 8 mg/dL — ABNORMAL LOW (ref 8.9–10.3)
Chloride: 108 mmol/L (ref 98–111)
Creatinine, Ser: 1.79 mg/dL — ABNORMAL HIGH (ref 0.61–1.24)
GFR, Estimated: 37 mL/min — ABNORMAL LOW (ref >60)
Glucose, Bld: 137 mg/dL — ABNORMAL HIGH (ref 70–99)
Potassium: 5.4 mmol/L — ABNORMAL HIGH (ref 3.5–5.1)
Sodium: 140 mmol/L (ref 135–145)
Total Bilirubin: 0.5 mg/dL (ref 0.0–1.2)
Total Protein: 5.9 g/dL — ABNORMAL LOW (ref 6.5–8.1)

## 2023-08-05 LAB — GLUCOSE, CAPILLARY
Glucose-Capillary: 109 mg/dL — ABNORMAL HIGH (ref 70–99)
Glucose-Capillary: 114 mg/dL — ABNORMAL HIGH (ref 70–99)
Glucose-Capillary: 120 mg/dL — ABNORMAL HIGH (ref 70–99)
Glucose-Capillary: 130 mg/dL — ABNORMAL HIGH (ref 70–99)
Glucose-Capillary: 130 mg/dL — ABNORMAL HIGH (ref 70–99)
Glucose-Capillary: 154 mg/dL — ABNORMAL HIGH (ref 70–99)

## 2023-08-05 LAB — CBC
HCT: 26.3 % — ABNORMAL LOW (ref 39.0–52.0)
Hemoglobin: 7.9 g/dL — ABNORMAL LOW (ref 13.0–17.0)
MCH: 30.4 pg (ref 26.0–34.0)
MCHC: 30 g/dL (ref 30.0–36.0)
MCV: 101.2 fL — ABNORMAL HIGH (ref 80.0–100.0)
Platelets: 210 10*3/uL (ref 150–400)
RBC: 2.6 MIL/uL — ABNORMAL LOW (ref 4.22–5.81)
RDW: 14.8 % (ref 11.5–15.5)
WBC: 5.9 10*3/uL (ref 4.0–10.5)
nRBC: 0 % (ref 0.0–0.2)

## 2023-08-05 LAB — MAGNESIUM: Magnesium: 3.1 mg/dL — ABNORMAL HIGH (ref 1.7–2.4)

## 2023-08-05 LAB — PHOSPHORUS: Phosphorus: 5.3 mg/dL — ABNORMAL HIGH (ref 2.5–4.6)

## 2023-08-05 MED ORDER — SODIUM ZIRCONIUM CYCLOSILICATE 10 G PO PACK
10.0000 g | PACK | Freq: Once | ORAL | Status: AC
  Administered 2023-08-05: 10 g
  Filled 2023-08-05: qty 1

## 2023-08-05 MED ORDER — DIPHENHYDRAMINE HCL 25 MG PO CAPS
25.0000 mg | ORAL_CAPSULE | Freq: Every day | ORAL | Status: DC
  Administered 2023-08-05 – 2023-08-07 (×3): 25 mg via ORAL
  Filled 2023-08-05 (×4): qty 1

## 2023-08-05 NOTE — Progress Notes (Signed)
 OT Cancellation Note  Patient Details Name: [name] MRN: [id number] DOB: 07/22/1940   Cancelled Treatment:    Reason Eval/Treat Not Completed: Other (comment) Pt currently receiving nursing care at bedside and is unable to participate in skilled OT session at this time. Will continue to follow up with pt at a later time and complete OT session when able.   Carollee Circle, OTR/L,CBIS  Supplemental OT - [facility] and [facility] Secure Chat Preferred   08/05/2023, 3:11 PM

## 2023-08-05 NOTE — Evaluation (Signed)
 Modified Barium Swallow Study  Patient Details  Name: Jack White MRN: [id number] Date of Birth: Jul 01, 1940  Today's Date: 08/05/2023  Modified Barium Swallow completed.  Full report located under Chart Review in the Imaging Section.  History of Present Illness 53 yoM with PMH as below, significant for HTN, HLD, DMT2, possible parkinsons disease, trigeminal neuralgia, CKD 3a, and BPH, who presented after mechanical fall on 5/27, not on AC or antiplatelets who developed N/V, AMS, with left sided weakness found to have large right SDH with 1.19m right to left MLS.  Admitted to NSGY and taken for emergent right craniotomy.  Was seen by PCCM while in Neuro ICU, extubated 5/27 evening.  ICU stay complicated by hypoactive delirium, cortrak placed for meds/ nutrition.  pt transferred out of ICU 5/30, CTH unchanged 5/31.  Started to develop increased fever curve.  On 6/1 PCCM re-consulted for development on hypoxia, previously on room air with upper airway congestion.  Daughter reports has not been coughing and clearing secretion.   Clinical Impression Pt has an oral dysphagia with esophageal component, but his pharyngeal phase is generally intact. Oral phase is marked by reduced labial seal and disorganized lingual transit. There is anterior loss, intermittent premature spillage, and oral residue that is mostly anteriorly located on the floor of his mouth and his bilateral buccal cavities. Mastication is slow with rest breaks (also question potential impact from loosely fitting lower dentures). Pharyngeal function was appropriate across all consistencies tested with no aspiration and no pharyngeal residue until the barium tablet, which was transiently lodged in the valleculae and then the UES. Once it entered into the esophagus it remained in the proximal portion of the esophagus, not clearing with multiple trials of purees and thin liquids. It ultimately moved down to his distal esophagus when given sips  of warm water . Note that other barium was present in the esophagus underneath the pill with some retrograde flow observed, but it did appear to clear more by the time the pill had descended more.   Upon completion of the study, after being transferred back to bed, pt did have regurgitation of the barium he had just consumed. Question if esophageal component could have contributed although his esophagus had been mostly cleared prior to laying him back. His daughter was present for the study and notes that he had been reporting nausea this morning. Recommend starting slowly with clear liquid diet (thin liquids). If he can tolerate this well, anticipate that he will be able to advance up to Dys 2 (finely chopped) diet with use of aspiration and esophageal precautions. Would crush PO medications. RN and MD both updated on recommendations as well.  Factors that may increase risk of adverse event in presence of aspiration Roderick Civatte & Jessy Morocco 2021): Respiratory or GI disease;Reduced cognitive function;Limited mobility;Dependence for feeding and/or oral hygiene  Swallow Evaluation Recommendations Recommendations: PO diet PO Diet Recommendation: Clear liquid diet;Thin liquids (Level 0) Liquid Administration via: Cup;Straw Medication Administration: Crushed with puree Supervision: Staff to assist with self-feeding;Full supervision/cueing for swallowing strategies Swallowing strategies  : Slow rate;Small bites/sips;Check for anterior loss;Follow solids with liquids Postural changes: Position pt fully upright for meals;Stay upright 30-60 min after meals Oral care recommendations: Oral care BID (2x/day) Caregiver Recommendations: Have oral suction available      Beth Brooke., M.A. CCC-SLP Acute Rehabilitation Services Office: [phone]  Secure chat preferred  08/05/2023,3:14 PM

## 2023-08-05 NOTE — Plan of Care (Signed)
  Problem: Activity: Goal: Ability to tolerate increased activity will improve Outcome: Progressing   Problem: Respiratory: Goal: Ability to maintain a clear airway and adequate ventilation will improve Outcome: Progressing   Problem: Role Relationship: Goal: Method of communication will improve Outcome: Progressing   Problem: Education: Goal: Knowledge of General Education information will improve Description: Including pain rating scale, medication(s)/side effects and non-pharmacologic comfort measures Outcome: Progressing   Problem: Health Behavior/Discharge Planning: Goal: Ability to manage health-related needs will improve Outcome: Progressing   Problem: Clinical Measurements: Goal: Ability to maintain clinical measurements within normal limits will improve Outcome: Progressing Goal: Will remain free from infection Outcome: Progressing Goal: Diagnostic test results will improve Outcome: Progressing Goal: Respiratory complications will improve Outcome: Progressing Goal: Cardiovascular complication will be avoided Outcome: Progressing   Problem: Activity: Goal: Risk for activity intolerance will decrease Outcome: Progressing   Problem: Nutrition: Goal: Adequate nutrition will be maintained Outcome: Progressing   Problem: Coping: Goal: Level of anxiety will decrease Outcome: Progressing   Problem: Elimination: Goal: Will not experience complications related to bowel motility Outcome: Progressing Goal: Will not experience complications related to urinary retention Outcome: Progressing   Problem: Pain Managment: Goal: General experience of comfort will improve and/or be controlled Outcome: Progressing   Problem: Safety: Goal: Ability to remain free from injury will improve Outcome: Progressing   Problem: Skin Integrity: Goal: Risk for impaired skin integrity will decrease Outcome: Progressing   Problem: Education: Goal: Ability to describe self-care  measures that may prevent or decrease complications (Diabetes Survival Skills Education) will improve Outcome: Progressing Goal: Individualized Educational Video(s) Outcome: Progressing   Problem: Coping: Goal: Ability to adjust to condition or change in health will improve Outcome: Progressing   Problem: Fluid Volume: Goal: Ability to maintain a balanced intake and output will improve Outcome: Progressing   Problem: Health Behavior/Discharge Planning: Goal: Ability to identify and utilize available resources and services will improve Outcome: Progressing Goal: Ability to manage health-related needs will improve Outcome: Progressing   Problem: Metabolic: Goal: Ability to maintain appropriate glucose levels will improve Outcome: Progressing   Problem: Nutritional: Goal: Maintenance of adequate nutrition will improve Outcome: Progressing Goal: Progress toward achieving an optimal weight will improve Outcome: Progressing   Problem: Skin Integrity: Goal: Risk for impaired skin integrity will decrease Outcome: Progressing   Problem: Tissue Perfusion: Goal: Adequacy of tissue perfusion will improve Outcome: Progressing   Problem: Education: Goal: Knowledge of the prescribed therapeutic regimen will improve Outcome: Progressing   Problem: Clinical Measurements: Goal: Usual level of consciousness will be regained or maintained. Outcome: Progressing Goal: Neurologic status will improve Outcome: Progressing Goal: Ability to maintain intracranial pressure will improve Outcome: Progressing   Problem: Skin Integrity: Goal: Demonstration of wound healing without infection will improve Outcome: Progressing

## 2023-08-05 NOTE — Progress Notes (Signed)
 PROGRESS NOTE    Jack White  BJS:[id number] DOB: March 09, 1940 DOA: 07/21/2023 PCP: Nestor Banter, MD    Brief Narrative:   Jack White is a 83 y.o. male [employer] veteran with past medical history significant for HTN, HLD, DM2, CKD stage IIIa, possible Parkinson's disease, trigeminal neuralgia, BPH who presented to [facility] ED on 07/22/2023 from home via EMS after mechanical fall with associated headache, left-sided weakness, nausea/vomiting.  Code stroke was initiated.  Last known normal 2130 same day as admission.  Not on anticoagulation or antiplatelets outpatient.  Workup in the ED was notable for a large right subdural hematoma with 1.6 mm right-to-left midline shift.  Neurosurgery was consulted and taken for emergent right craniotomy; and admitted to the neuro ICU under the neurosurgical service with PCCM consulting.  Significant hospital events: 5/27: admit by neurosurgery for SDH s/p craniotomy, PCCM consulted; extubated 5/28: Intermittent agitation/confusion, difficulty sleeping 5/29: Restlessness overnight, sleeping poorly on a.m. assessment 5/30: Repeat CT head stable, transferred out of ICU; Eval by CIR>does not meet level of care 6/1: CCM reconsulted for hypoxia, CT angiogram chest negative for PE, small bilateral pleural effusions, multifocal groundglass opacities bilateral upper lobes, right middle lobe concerning for infectious process 6/2: re-eval by CIR> continues to not meet level of care and signed off 6/3: Encephalopathy improved, following commands for RN (thumbs up, wiggling toes, stating name)Fatigues quickly, but more awake than prior  6/4: Hemoglobin down to 6.9, 1 unit PRBC ordered 6/5: Creatinine rising, sodium improved with free water , IVC on POCUS collapsible with marked respiratory variation, IV fluids given; concern for possible aspiration event following swallow evaluation 6/6: Creatinine improved, stopped IV fluids; mental status waxing/waning; started on  Provigil  6/9: Transferred to [facility]; pain overnight, given dose of morphine  by PCCM coverage; awaiting MBS by SLP 6/10: MBS planned for today  Assessment & Plan:   Traumatic subdural hematoma with midline shift s/p craniotomy Patient presenting to ED following mechanical fall with associated left-sided weakness, nausea/vomiting and confusion.  CT head with findings of a large right subdural hematoma with 1.6 mm right-to-left midline shift.  Neurosurgery was consulted and patient was taken emergently for right craniotomy.  Repeat CT head 5/28 with decreased right SDH, intracranial mass effect and left word midline shift, right subdural drain in place.  CT head 5/30 mild additional regression right SDH, decreasing leftward midline shift now 5 mm, right subdural drain removed.  CT head 5/31 residual subdural hematoma 1.5 cm in thickness with mass effect right cerebral hemisphere 5 to 6 mm right-to-left shift, smaller left-sided SDH without mass effect, not significant change from prior. -- Neuroprotective measures: HOB >30 degrees, normoglycemia, normothermia -- Continue therapy efforts; currently recommending SNF; TOC following -- Further per neurosurgery  Hyperkalemia Potassium 5.4 this morning. -- Repeat Lokelma  10 g per tube x 1 today -- Monitor on telemetry -- CMP in a.m.  Dysphagia -- Continue tube feeding via Cortrak tube: Osmolite 1.5 at 50 ml/hr (1200 ml per day), ProSource TF20 60 ml BID, 912 ml free water  daily -- Aspiration precautions -- MBS planned by SLP today; if does not progress, will need PEG tube placement  Acute renal failure on CKD stage IIIa -- Cr 1.50>>1.37>>1.99>2.17>>1.73>1.94>1.79 (baseline 1.3 - 1.5) -- Avoid nephrotoxins, renal dose all medications  Hypoactive delirium -- Started on Provigil  by PCCM -- Delirium precautions -- Get up during the day (chair) -- Encourage a familiar face to remain present throughout the day -- Keep blinds open and lights on during  daylight  hours -- Minimize the use of opioids/benzodiazepines  Aspiration pneumonia On 07/27/2023, patient became hypoxic.  CT angiogram chest negative for PE but with small bilateral pleural effusions and multifocal groundglass opacities bilateral upper lobes, right middle lobe concerning for infectious process, likely aspiration.  Completed 5-day course of Unasyn  on 08/01/2023.  Oxygen has been weaned off. -- Aspirate precautions  Anemia -- Hgb 10.9>>6.5>>8.1>>6.9>>8.5>8.1>8.1, stable -- transfused 1u pRBC on 07/30/2023  HTN On metoprolol  succinate 25 mg p.o. daily at baseline. --BP 113/70, HR 75 today -- Hold home metoprolol  for now -- Continue BP closely  HLD -- Atorvastatin  10 mg per tube daily  DM2 Hemoglobin A1c 6.5%. -- Semglee  10 units [place] daily -- NovoLog  5 units Uncertain every 4 hours -- Moderate SSI for coverage -- CBG every 4 hours while on tube feeds  Parkinson's disease -- Sinemet  1 tablet 3 times daily  Trigeminal neuralgia Follow-up with neurology outpatient, Dr. Winferd Hatter; last seen 07/16/2023. -- Tegretol  level 5.7 on 07/30/2023 -- Continue Tegretol  200 mg per tube TiD  BPH -- Doxazosin  4 mg per tube daily  Insomnia Reports patient takes 10 mg of melatonin and 50 mg of Benadryl  nightly at home. -- Increase melatonin to 10 mg nightly -- Benadryl  25 mg p.o. nightly today  Left heel ulcer, POA Sacral/coccyx skin tear Seen by podiatry at [facility], underwent debridement on 5/22. -- Cleanse L heel wound with NS, apply Bacitracin  ointment to wound bed daily, cover with Telfa nonstick dressing and secure with silicone foam or Kerlix roll gauze whichever is preferred.  -- Apply sacral foam dressing, change every 3 days or PRN   Weakness/debility/deconditioning: -- Continue PT/OT efforts -- Will need SNF placement  DVT prophylaxis: enoxaparin  (LOVENOX ) injection 40 mg Start: 07/28/23 1445 SCDs Start: 07/22/23 0217 Place and maintain sequential compression device Start: 07/22/23  0059    Code Status: Do not attempt resuscitation (DNR) PRE-ARREST INTERVENTIONS DESIRED Family Communication: Stated daughter extensively at bedside this morning  Disposition Plan:  Level of care: ICU Status is: Inpatient Remains inpatient appropriate because: Pending MBS, if does not progress will need PEG tube placement, will need SNF placement, TOC following    Consultants:  Neurosurgery PCCM: Signed off 6/9  Procedures:  Right frontal parietal craniotomy, evacuation SDH, Dr. Nat Badger 5/27 Extubation 5/27 Cortrak placement 5/28  Antimicrobials:  Unasyn  6/1 - 6/6   Subjective: Patient seen examined bedside, lying in bed.  Awake but falls asleep quickly.  RN and daughter present at bedside.  Patient complaining of headache, nausea.  Pending MBS this afternoon, discussed with daughter once again if continues to fail swallow evaluation and will need PEG tube placement.  Patient denies shortness of breath, no chest pain, no abdominal pain.  No acute concerns overnight per nursing staff.  Objective: Vitals:   08/05/23 0400 08/05/23 0500 08/05/23 0600 08/05/23 0700  BP: 116/60  (!) 130/58   Pulse: 73 70 79   Resp: 16 14 17    Temp: (!) 97 F (36.1 C)   (!) 97.5 F (36.4 C)  TempSrc: Axillary   Axillary  SpO2: 98% 97% 98%   Weight:  103 kg    Height:        Intake/Output Summary (Last 24 hours) at 08/05/2023 0943 Last data filed at 08/05/2023 0641 Gross per 24 hour  Intake 1637.14 ml  Output 1250 ml  Net 387.14 ml   Filed Weights   08/03/23 0500 08/04/23 0500 08/05/23 0500  Weight: 102.5 kg 101 kg 103 kg  Examination:  Physical Exam: GEN: NAD, alert, chronically ill/weak in appearance HEENT: Right frontal craniotomy incision site noted with pupils 2 mm, equal round and reactive to light, EOMI, sclera clear, dry mucous membranes PULM: Auditory upper respiratory founds noted, otherwise lungs CTAB w/o wheezes/crackles, normal respiratory effort on room air with  SpO2 98% at rest CV: RRR w/o M/G/R GI: abd soft, NTND, + BS MSK: Trace-1+ bilateral lower extremity peripheral edema PSYCH: normal mood/affect Integumentary: Surgical craniotomy incision site noted with staples in place, well-approximated multiple areas of ecchymosis noted in various stages of healing, left heel wound, sacral/coccyx skin tear, otherwise no other concerning rashes/lesions/wounds noted on exposed skin surfaces.      Data Reviewed: I have personally reviewed following labs and imaging studies  CBC: Recent Labs  Lab 07/31/23 0548 08/01/23 0728 08/03/23 0454 08/04/23 0541 08/05/23 0523  WBC 5.0 6.1 5.8 6.0 5.9  NEUTROABS  --   --  3.7 3.7  --   HGB 7.9* 8.5* 8.1* 8.1* 7.9*  HCT 25.2* 27.6* 26.2* 26.3* 26.3*  MCV 96.9 98.9 99.2 101.9* 101.2*  PLT 179 206 197 220 210   Basic Metabolic Panel: Recent Labs  Lab 07/30/23 0507 07/31/23 0548 08/01/23 0728 08/02/23 0438 08/03/23 0454 08/04/23 0541 08/05/23 0523  NA 146*   < > 147* 141 142 143 140  K 4.8   < > 4.8 5.1 5.2* 5.7* 5.4*  CL 106   < > 112* 109 111 116* 108  CO2 28   < > 26 26 26  20* 24  GLUCOSE 153*   < > 99 163* 124* 126* 137*  BUN 85*   < > 82* 79* 77* 82* 77*  CREATININE 1.99*   < > 1.84* 1.88* 1.73* 1.94* 1.79*  CALCIUM  7.9*   < > 8.3* 7.7* 8.1* 8.3* 8.0*  MG 2.9*  --   --   --   --  3.1* 3.1*  PHOS 5.5*  --   --   --   --  4.7* 5.3*   < > = values in this interval not displayed.   GFR: Estimated Creatinine Clearance: 38.2 mL/min (A) (by C-G formula based on SCr of 1.79 mg/dL (H)). Liver Function Tests: Recent Labs  Lab 08/05/23 0523  AST 17  ALT 6  ALKPHOS 105  BILITOT 0.5  PROT 5.9*  ALBUMIN  2.4*   No results for input(s): "LIPASE", "AMYLASE" in the last 168 hours. No results for input(s): "AMMONIA" in the last 168 hours. Coagulation Profile: No results for input(s): "INR", "PROTIME" in the last 168 hours. Cardiac Enzymes: No results for input(s): "CKTOTAL", "CKMB", "CKMBINDEX",  "TROPONINI" in the last 168 hours. BNP (last 3 results) No results for input(s): "PROBNP" in the last 8760 hours. HbA1C: No results for input(s): "HGBA1C" in the last 72 hours. CBG: Recent Labs  Lab 08/04/23 1532 08/04/23 1950 08/04/23 2342 08/05/23 0359 08/05/23 0755  GLUCAP 110* 121* 118* 114* 120*   Lipid Profile: No results for input(s): "CHOL", "HDL", "LDLCALC", "TRIG", "CHOLHDL", "LDLDIRECT" in the last 72 hours. Thyroid  Function Tests: No results for input(s): "TSH", "T4TOTAL", "FREET4", "T3FREE", "THYROIDAB" in the last 72 hours. Anemia Panel: No results for input(s): "VITAMINB12", "FOLATE", "FERRITIN", "TIBC", "IRON", "RETICCTPCT" in the last 72 hours. Sepsis Labs: No results for input(s): "PROCALCITON", "LATICACIDVEN" in the last 168 hours.  Recent Results (from the past 240 hours)  Culture, blood (Routine X 2) w Reflex to ID Panel     Status: None   Collection Time: 07/27/23  6:52 PM   Specimen: BLOOD  Result Value Ref Range Status   Specimen Description BLOOD BLOOD LEFT ARM  Final   Special Requests   Final    AEROBIC BOTTLE ONLY Blood Culture results may not be optimal due to an inadequate volume of blood received in culture bottles   Culture   Final    NO GROWTH 5 DAYS Performed at [facility], [address], [place], [place] [address]    Report Status 08/01/2023 FINAL  Final  Culture, blood (Routine X 2) w Reflex to ID Panel     Status: None   Collection Time: 07/27/23  6:54 PM   Specimen: BLOOD  Result Value Ref Range Status   Specimen Description BLOOD BLOOD LEFT HAND  Final   Special Requests   Final    AEROBIC BOTTLE ONLY Blood Culture results may not be optimal due to an inadequate volume of blood received in culture bottles   Culture   Final    NO GROWTH 5 DAYS Performed at [facility], [address] [place], [place] [address]    Report Status 08/01/2023 FINAL  Final         Radiology Studies: No results  found.      Scheduled Meds:  acetaminophen   650 mg Per Tube Q6H   atorvastatin   10 mg Per Tube QHS   bacitracin    Topical Daily   carbamazepine   200 mg Per Tube TID   carbidopa -levodopa   1 tablet Per Tube TID   Chlorhexidine  Gluconate Cloth  6 each Topical Daily   diphenhydrAMINE   25 mg Oral QHS   docusate  100 mg Per Tube BID   doxazosin   4 mg Per Tube Daily   enoxaparin  (LOVENOX ) injection  40 mg Subcutaneous Q24H   famotidine   20 mg Per Tube Daily   feeding supplement (PROSource TF20)  60 mL Per Tube BID   free water   200 mL Per Tube Q4H   insulin  aspart  0-15 Units Subcutaneous Q4H   insulin  aspart  5 Units Subcutaneous Q4H   insulin  glargine-yfgn  10 Units Subcutaneous Daily   melatonin  10 mg Oral QHS   modafinil   100 mg Per Tube Daily   montelukast   10 mg Per Tube QHS   mouth rinse  15 mL Mouth Rinse 4 times per day   polyethylene glycol  17 g Per Tube Daily   sennosides  5 mL Per Tube QHS   Continuous Infusions:  feeding supplement (OSMOLITE 1.5 CAL) 50 mL/hr at 08/05/23 0641     LOS: 14 days    Time spent: 48 minutes spent on 08/05/2023 caring for this patient face-to-face including chart review, ordering labs/tests, documenting, discussion with nursing staff, consultants, updating family and interview/physical exam    Rema Care [place], DO [facility] Available via Epic secure chat 7am-7pm After these hours, please refer to coverage provider listed on [email or web address] 08/05/2023, 9:43 AM

## 2023-08-06 ENCOUNTER — Other Ambulatory Visit (HOSPITAL_COMMUNITY)

## 2023-08-06 ENCOUNTER — Encounter (HOSPITAL_COMMUNITY): Admission: RE | Admit: 2023-08-06 | Source: Ambulatory Visit

## 2023-08-06 DIAGNOSIS — Z9889 Other specified postprocedural states: Secondary | ICD-10-CM | POA: Diagnosis not present

## 2023-08-06 LAB — BASIC METABOLIC PANEL WITH GFR
Anion gap: 9 (ref 5–15)
BUN: 76 mg/dL — ABNORMAL HIGH (ref 8–23)
CO2: 23 mmol/L (ref 22–32)
Calcium: 8.1 mg/dL — ABNORMAL LOW (ref 8.9–10.3)
Chloride: 107 mmol/L (ref 98–111)
Creatinine, Ser: 1.66 mg/dL — ABNORMAL HIGH (ref 0.61–1.24)
GFR, Estimated: 40 mL/min — ABNORMAL LOW (ref >60)
Glucose, Bld: 114 mg/dL — ABNORMAL HIGH (ref 70–99)
Potassium: 5.6 mmol/L — ABNORMAL HIGH (ref 3.5–5.1)
Sodium: 139 mmol/L (ref 135–145)

## 2023-08-06 LAB — CBC
HCT: 25 % — ABNORMAL LOW (ref 39.0–52.0)
Hemoglobin: 7.7 g/dL — ABNORMAL LOW (ref 13.0–17.0)
MCH: 30.4 pg (ref 26.0–34.0)
MCHC: 30.8 g/dL (ref 30.0–36.0)
MCV: 98.8 fL (ref 80.0–100.0)
Platelets: 212 10*3/uL (ref 150–400)
RBC: 2.53 MIL/uL — ABNORMAL LOW (ref 4.22–5.81)
RDW: 14.8 % (ref 11.5–15.5)
WBC: 6.4 10*3/uL (ref 4.0–10.5)
nRBC: 0 % (ref 0.0–0.2)

## 2023-08-06 LAB — MAGNESIUM: Magnesium: 3 mg/dL — ABNORMAL HIGH (ref 1.7–2.4)

## 2023-08-06 LAB — GLUCOSE, CAPILLARY
Glucose-Capillary: 133 mg/dL — ABNORMAL HIGH (ref 70–99)
Glucose-Capillary: 146 mg/dL — ABNORMAL HIGH (ref 70–99)
Glucose-Capillary: 151 mg/dL — ABNORMAL HIGH (ref 70–99)
Glucose-Capillary: 72 mg/dL (ref 70–99)
Glucose-Capillary: 85 mg/dL (ref 70–99)
Glucose-Capillary: 92 mg/dL (ref 70–99)

## 2023-08-06 LAB — POTASSIUM: Potassium: 5.4 mmol/L — ABNORMAL HIGH (ref 3.5–5.1)

## 2023-08-06 LAB — PHOSPHORUS: Phosphorus: 5.5 mg/dL — ABNORMAL HIGH (ref 2.5–4.6)

## 2023-08-06 MED ORDER — ENSURE PLUS HIGH PROTEIN PO LIQD
237.0000 mL | Freq: Two times a day (BID) | ORAL | Status: DC
  Administered 2023-08-06 – 2023-08-10 (×7): 237 mL via ORAL

## 2023-08-06 MED ORDER — OSMOLITE 1.5 CAL PO LIQD
996.0000 mL | ORAL | Status: DC
  Administered 2023-08-06 – 2023-08-07 (×2): 996 mL
  Administered 2023-08-08: 1000 mL
  Filled 2023-08-06 (×3): qty 1000

## 2023-08-06 MED ORDER — FREE WATER
100.0000 mL | Status: DC
  Administered 2023-08-06 – 2023-08-08 (×14): 100 mL

## 2023-08-06 MED ORDER — SODIUM ZIRCONIUM CYCLOSILICATE 10 G PO PACK
10.0000 g | PACK | Freq: Once | ORAL | Status: AC
  Administered 2023-08-06: 10 g via ORAL
  Filled 2023-08-06: qty 1

## 2023-08-06 MED ORDER — SODIUM ZIRCONIUM CYCLOSILICATE 10 G PO PACK: 10.0000 g | PACK | Freq: Every day | ORAL | Status: DC

## 2023-08-06 MED ORDER — PROSOURCE TF20 ENFIT COMPATIBL EN LIQD
60.0000 mL | Freq: Every day | ENTERAL | Status: DC
  Administered 2023-08-07 – 2023-08-08 (×2): 60 mL
  Filled 2023-08-06 (×3): qty 60

## 2023-08-06 NOTE — Progress Notes (Signed)
 Nutrition Follow-up  DOCUMENTATION CODES:   Non-severe (moderate) malnutrition in context of chronic illness  INTERVENTION:  Change tube feeding to nocturnal via Cortrak tube: Osmolite 1.5 at 83 ml/hr (996 ml per day) ProSource TF20 60 ml Daily Provides 1574 kcal, (82% of needs) 80 gm protein (72% of needs), 912 ml free water  daily Decrease free water  100 ml free water  every 4 hours Total free water : 2112 ml  Discussed menu options with daughter. We discussed goal is to transition to PO diet only and remove cortrak as PO intake increases.   Ensure Plus High Protein po BID, each supplement provides 350 kcal and 20 grams of protein.  Magic cup BID with Lunch and Dinner, each supplement provides 290 kcal and 9 grams of protein  NUTRITION DIAGNOSIS:   Moderate Malnutrition related to chronic illness as evidenced by moderate fat depletion, moderate muscle depletion. - Ongoing, being addressed via TF   GOAL:   Patient will meet greater than or equal to 90% of their needs - Met via TF  MONITOR:   TF tolerance  REASON FOR ASSESSMENT:   Consult Enteral/tube feeding initiation and management  ASSESSMENT:   Pt with PMH of HTN, HLD, DM, Parkinson's dz, trigeminal neuralgia, CKD 3, and L heel ulcer seen by podiatry at [facility] s/p debridement 5/22 now admitted 5/26 after a fall with large R acute SDH.   Pt discussed during ICU rounds and with RN and MD.  Eldora Greet with daughter at bedside. SLP has advanced diet to Dysphagia 2 with thin liquids.  Pt willing to try ensure and magic cup.   Pt more awake during my visit than previously. Pt sitting in chair. He had a few bites of his soup, chicken salad, and ice cream. Daughter feeding pt and plans to schedule dinner for 6 pm when she will be back.   5/27 - s/p R crani for evacuation of SDH 5/28 - s/p cortrak placement; tip gastric 5/30 - transferred out of ICU 6/01 - transferred back to ICU 6/03 - SLP eval, recommend NPO 6/11 - diet  advanced to Dysphagia 2 with thin liquids, ate 10% of lunch   Admission Weight: 105 kg (5/26) Current Weight: 101 kg (6/09)  Nutrition Related Medications: Sinemet  TID, Colace, Pepcid , Lasix , NovoLog  0-15 units q4h + 5 units q4h, Semglee  10 units daily, Miralax , Senokot, lokelma  x 1  Labs:  K 5.7 BUN 26 (07/21/23) ->77 -> 82 Cr 1.94 Phos 4.7 Mag 3.1 A1C 6.5  CBG: 110-163 mg/dL x 24 hrs   UOP: 161 mL x 24 hrs  Diet Order:   Diet Order             DIET DYS 2 Room service appropriate? Yes; Fluid consistency: Thin  Diet effective now                  EDUCATION NEEDS:   Not appropriate for education at this time  Skin:  Skin Assessment: Skin Integrity Issues: Skin Integrity Issues:: Other (Comment) Other: L heel wound followed by [facility]  Last BM:  6/8 x 2 type 6  Height:  Ht Readings from Last 1 Encounters:  07/21/23 6' (1.829 m)   Weight:  Wt Readings from Last 1 Encounters:  08/06/23 103.3 kg   BMI:  Body mass index is 30.89 kg/m.  Estimated Nutritional Needs:  Kcal:  1900-2100 Protein:  110-125 grams Fluid:  >1.9 L/day  Randine Butcher., RD, LDN, CNSC See AMiON for contact information

## 2023-08-06 NOTE — Progress Notes (Signed)
 Occupational Therapy Treatment Patient Details Name: Jack White MRN: [id number] DOB: January 23, 1941 Today's Date: 08/06/2023   History of present illness Patient is an 83 y.o. male who presented to the ED via EMS 07/22/2023 as a code stroke after becoming unresponsive at home. He was intubated upon arrival. CT head showed large right holohemispheric acute SDH. Neurosurgery was consulted and patient underwent a  right craniotomy on 07/22/23. 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   OT comments  Pt progressing toward established goals with 2/6 goals upgraded today per progress update. Pt currently requiring mod-max A for seated bil grooming tasks and max-total A +2 for STS transfers. Pt with much improved arousal this session. Pt needing dense cues for problem solving in light of LUE weakness during bil tasks. Daughter present and receptive to education for UE ROM and strengthening as well as providing opportunities for self grooming at feeding. Patient will benefit from continued inpatient follow up therapy, <3 hours/day       If plan is discharge home, recommend the following:  Two people to help with walking and/or transfers;Two people to help with bathing/dressing/bathroom;Other (comment)   Equipment Recommendations  Wheelchair (measurements OT);Wheelchair cushion (measurements OT);BSC/3in1;Hoyer lift    Recommendations for Other Services      Precautions / Restrictions Precautions Precautions: Fall Recall of Precautions/Restrictions: Impaired Restrictions Weight Bearing Restrictions Per Provider Order: No       Mobility Bed Mobility Overal bed mobility: Needs Assistance Bed Mobility: Supine to Sit     Supine to sit: Max assist, +2 for physical assistance     General bed mobility comments: Assist to initiate bringing BLE's off edge of bed, trunk to upright, use of bed pad to scoot R hip out to edge of bed    Transfers Overall  transfer level: Needs assistance Equipment used: Ambulation equipment used, Rolling walker (2 wheels), 2 person hand held assist Transfers: Sit to/from Stand, Bed to chair/wheelchair/BSC Sit to Stand: Max assist, +2 physical assistance           General transfer comment: Trialed multiple methods for transfer to standing including RW, 2 person HHA and Stedy. Provided anterior foot block and use of bed pad as sling to lift hips. Pt unable to achieve upright posture, fear of falling. Maxi sky transfer bed to chair Transfer via Lift Equipment: Sarita Curd   Balance Overall balance assessment: Needs assistance Sitting-balance support: Feet supported Sitting balance-Leahy Scale: Poor Sitting balance - Comments: Reliant on RUE support, R lateral lean with fatigue                                   ADL either performed or assessed with clinical judgement   ADL Overall ADL's : Needs assistance/impaired Eating/Feeding: Set up;Sitting Eating/Feeding Details (indicate cue type and reason): in supportive chair; pt needing set-up to bring cup to mouth with increased time for problem solving, bumping cup on L hand on pillow in lap bringing off tray table. Grooming: Oral care;Moderate assistance;Sitting;Maximal assistance Grooming Details (indicate cue type and reason): dense cues for problem solviing which UE to place items in and working around L weakness. Pt needing hand over hand to optimize grasp on toothpaste tube to unscrew with RUE and cues for this set up. Needing set up and assisit to place toothpaste on bursh. Brushed teeth for ~30 seconds before becoming internally distracted by BLE pain and would not  return to task                 Toilet Transfer: Maximal assistance;+2 for physical assistance;+2 for safety/equipment Toilet Transfer Details (indicate cue type and reason): for STS with poor hip extension                Extremity/Trunk Assessment Upper Extremity  Assessment Upper Extremity Assessment: RUE deficits/detail;LUE deficits/detail RUE Deficits / Details: uses functionally with increased time. generally weak. Difficulty with coordination/navigating around obstacles when reaching for cup, bumped cup into his L hand supported on a pillow in lap RUE Coordination: decreased gross motor;decreased fine motor LUE Deficits / Details: sime inattention to this UE; guards this UE at times. 3/5 grip strength able to squeeze green foam block, limited in elbow extension and shoulder flexion/external rotation. decr initiation as compared to R but improving, attempts to reach for items LUE Coordination: decreased fine motor;decreased gross motor   Lower Extremity Assessment Lower Extremity Assessment: Defer to PT evaluation        Vision   Vision Assessment?: Vision impaired- to be further tested in functional context Additional Comments: Pt reaching for items on tray table, per daughter vision not great at baseline   Perception     Praxis     Communication Communication Communication: Impaired Factors Affecting Communication: Hearing impaired   Cognition Arousal: Alert Behavior During Therapy: Flat affect Cognition: Cognition impaired Difficult to assess due to: Impaired communication   Awareness: Intellectual awareness impaired, Online awareness impaired Memory impairment (select all impairments): Short-term memory, Working memory, Non-declarative long-term memory, Geneticist, molecular long-term memory Attention impairment (select first level of impairment): Sustained attention, Focused attention (internally distracted by pain needing cues to attend to ADL taks) Executive functioning impairment (select all impairments): Initiation, Organization, Sequencing, Reasoning, Problem solving OT - Cognition Comments: Follows one step commands bilaterally but R more consistent than L although limited by physical impairment on L as well. able to maintain alert state  this session. needing external cues for sequencing oral care task of applying toothpaste to toothbrush etc as well as cues for sustaining task becoming internally distracted by pain.                 Following commands: Impaired Following commands impaired: Follows one step commands inconsistently      Cueing   Cueing Techniques: Verbal cues, Tactile cues, Visual cues  Exercises Exercises: General Upper Extremity General Exercises - Upper Extremity Shoulder Flexion: AAROM, Left, 5 reps Elbow Flexion: AROM, 10 reps, Left Elbow Extension: AAROM, 10 reps, Left Digit Composite Flexion: AROM, Left, 10 reps, Squeeze ball Composite Extension: AROM, Left, 10 reps    Shoulder Instructions       General Comments      Pertinent Vitals/ Pain       Pain Assessment Pain Assessment: Faces Faces Pain Scale: Hurts even more Pain Location: moans with BLE movement Pain Descriptors / Indicators: Moaning, Sore Pain Intervention(s): Limited activity within patient's tolerance, Monitored during session  Home Living                                          Prior Functioning/Environment              Frequency  Min 2X/week        Progress Toward Goals  OT Goals(current goals can now be found in the care plan section)  Progress  towards OT goals: Progressing toward goals  Acute Rehab OT Goals Patient Stated Goal: none stated OT Goal Formulation: Patient unable to participate in goal setting Time For Goal Achievement: 08/20/23 Potential to Achieve Goals: Fair ADL Goals Pt Will Perform Eating: with contact guard assist;sitting;with adaptive utensils Pt Will Perform Grooming: with min assist;sitting Pt Will Perform Upper Body Bathing: with min assist;sitting  Plan      Co-evaluation    PT/OT/SLP Co-Evaluation/Treatment: Yes Reason for Co-Treatment: Complexity of the patient's impairments (multi-system involvement);Necessary to address cognition/behavior  during functional activity;For patient/therapist safety;To address functional/ADL transfers PT goals addressed during session: Mobility/safety with mobility;Balance OT goals addressed during session: ADL's and self-care      AM-PAC OT 6 Clicks Daily Activity     Outcome Measure   Help from another person eating meals?: A Lot Help from another person taking care of personal grooming?: A Lot Help from another person toileting, which includes using toliet, bedpan, or urinal?: Total Help from another person bathing (including washing, rinsing, drying)?: A Lot Help from another person to put on and taking off regular upper body clothing?: A Lot Help from another person to put on and taking off regular lower body clothing?: Total 6 Click Score: 10    End of Session Equipment Utilized During Treatment: Gait belt;Rolling walker (2 wheels) (hoyer, stedy)  OT Visit Diagnosis: Unsteadiness on feet (R26.81);Muscle weakness (generalized) (M62.81);History of falling (Z91.81);Other symptoms and signs involving cognitive function   Activity Tolerance Patient limited by lethargy   Patient Left in bed;with call bell/phone within reach;with bed alarm set;with family/visitor present   Nurse Communication Mobility status        Time: 4034-7425 OT Time Calculation (min): 55 min  Charges: OT General Charges $OT Visit: 1 Visit OT Treatments $Self Care/Home Management : 8-22 mins $Therapeutic Exercise: 8-22 mins  [name], OTD, OTR/L [facility] Acute Rehabilitation Office: [phone]   [name] 08/06/2023, 1:12 PM

## 2023-08-06 NOTE — Progress Notes (Signed)
 Physical Therapy Treatment Patient Details Name: Jack White MRN: [id number] DOB: 02/10/1941 Today's Date: 08/06/2023   History of Present Illness Patient is an 83 y.o. male who presented to the ED via EMS 07/22/2023 as a code stroke after becoming unresponsive at home. He was intubated upon arrival. CT head showed large right holohemispheric acute SDH. Neurosurgery was consulted and patient underwent a  right craniotomy on 07/22/23. 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    PT Comments  Pt maintains alertness for entirety of session, waving hello to therapist on entry, but still demonstrates decreased command following and initiation for motor tasks. Pt moaning with BLE movement. Requiring two person maximal assist for functional mobility. Worked on sitting balance, ADL task with OT, and transfer training with RW vs HHA vs Stedy. Pt with fear of falling and difficulty achieving upright posture. Patient will benefit from continued inpatient follow up therapy, <3 hours/day.    If plan is discharge home, recommend the following: Two people to help with walking and/or transfers;Two people to help with bathing/dressing/bathroom   Can travel by private vehicle     No  Equipment Recommendations  Other (comment) (defer)    Recommendations for Other Services       Precautions / Restrictions Precautions Precautions: Fall Recall of Precautions/Restrictions: Impaired Restrictions Weight Bearing Restrictions Per Provider Order: No     Mobility  Bed Mobility Overal bed mobility: Needs Assistance Bed Mobility: Supine to Sit     Supine to sit: Max assist, +2 for physical assistance     General bed mobility comments: Assist to initiate bringing BLE's off edge of bed, trunk to upright, use of bed pad to scoot R hip out to edge of bed    Transfers Overall transfer level: Needs assistance Equipment used: Ambulation equipment used, Rolling walker  (2 wheels), 2 person hand held assist Transfers: Sit to/from Stand, Bed to chair/wheelchair/BSC Sit to Stand: Max assist, +2 physical assistance           General transfer comment: Trialed multiple methods for transfer to standing including RW, 2 person HHA and Stedy. Provided anterior foot block and use of bed pad as sling to lift hips. Pt unable to achieve upright posture, fear of falling. Maxi sky transfer bed to chair Transfer via Lift Equipment: Stedy  Ambulation/Gait                   Stairs             Wheelchair Mobility     Tilt Bed    Modified Rankin (Stroke Patients Only) Modified Rankin (Stroke Patients Only) Pre-Morbid Rankin Score: Moderate disability Modified Rankin: Severe disability     Balance Overall balance assessment: Needs assistance Sitting-balance support: Feet supported Sitting balance-Leahy Scale: Poor Sitting balance - Comments: Reliant on RUE support, R lateral lean with fatigue                                    Communication Communication Communication: Impaired Factors Affecting Communication: Hearing impaired  Cognition Arousal: Alert Behavior During Therapy: Flat affect   PT - Cognitive impairments: Orientation, Memory, Awareness, Attention, Initiation, Sequencing, Problem solving, Safety/Judgement                         Following commands: Impaired Following commands impaired: Follows one step commands inconsistently  Cueing Cueing Techniques: Verbal cues, Tactile cues, Visual cues  Exercises      General Comments        Pertinent Vitals/Pain Pain Assessment Pain Assessment: Faces Faces Pain Scale: Hurts little more Pain Location: Moans with BLE movement Pain Descriptors / Indicators: Moaning, Sore Pain Intervention(s): Monitored during session    Home Living                          Prior Function            PT Goals (current goals can now be found in the care  plan section) Acute Rehab PT Goals Patient Stated Goal: pt daughter agreeable to rehab Time For Goal Achievement: 08/20/23 Potential to Achieve Goals: Fair    Frequency    Min 2X/week      PT Plan      Co-evaluation PT/OT/SLP Co-Evaluation/Treatment: Yes Reason for Co-Treatment: Complexity of the patient's impairments (multi-system involvement);Necessary to address cognition/behavior during functional activity;For patient/therapist safety;To address functional/ADL transfers PT goals addressed during session: Mobility/safety with mobility;Balance        AM-PAC PT 6 Clicks Mobility   Outcome Measure  Help needed turning from your back to your side while in a flat bed without using bedrails?: A Lot Help needed moving from lying on your back to sitting on the side of a flat bed without using bedrails?: A Lot Help needed moving to and from a bed to a chair (including a wheelchair)?: Total Help needed standing up from a chair using your arms (e.g., wheelchair or bedside chair)?: Total Help needed to walk in hospital room?: Total Help needed climbing 3-5 steps with a railing? : Total 6 Click Score: 8    End of Session Equipment Utilized During Treatment: Gait belt Activity Tolerance: Patient tolerated treatment well Patient left: in chair;with call bell/phone within reach;with chair alarm set Nurse Communication: Mobility status;Need for lift equipment PT Visit Diagnosis: Unsteadiness on feet (R26.81);History of falling (Z91.81);Difficulty in walking, not elsewhere classified (R26.2);Pain     Time: 0272-5366 PT Time Calculation (min) (ACUTE ONLY): 49 min  Charges:    $Therapeutic Activity: 8-22 mins PT General Charges $$ ACUTE PT VISIT: 1 Visit                     Verdia Glad, PT, DPT Acute Rehabilitation Services Office [phone]    Claria Crofts 08/06/2023, 10:10 AM

## 2023-08-06 NOTE — TOC Progression Note (Signed)
 Transition of Care [facility]) - Progression Note    Patient Details  Name: KOUPER SPINELLA MRN: [id number] Date of Birth: 1940-05-28  [facility] CM/SW Contact  Jannice Mends, LCSW Phone Number: 08/06/2023, 10:12 AM  Clinical Narrative:    CSW continuing to follow. Will send out SNF referral once cortrak is removed.    Expected Discharge Plan: Skilled Nursing Facility Barriers to Discharge: Continued Medical Work up, English as a second language teacher, SNF Pending bed offer  Expected Discharge Plan and Services In-house Referral: Clinical Social Work Discharge Planning Services: [employer] Consult Post Acute Care Choice: Skilled Nursing Facility Living arrangements for the past 2 months: Mobile Home                                       Social Determinants of Health (SDOH) Interventions SDOH Screenings   Food Insecurity: No Food Insecurity (07/22/2023)  Housing: Low Risk  (07/22/2023)  Transportation Needs: No Transportation Needs (07/22/2023)  Utilities: Not At Risk (07/22/2023)  Financial Resource Strain: Patient Declined (02/07/2023)   [facility]  Social Connections: Moderately Integrated (07/24/2023)  Tobacco Use: Low Risk  (07/22/2023)  Recent Concern: Tobacco Use - Medium Risk (07/17/2023)   Received from [facility]    Readmission Risk Interventions     No data to display

## 2023-08-06 NOTE — Progress Notes (Signed)
 PROGRESS NOTE    Jack White  IHK:[id number] DOB: 02-16-1941 DOA: 07/21/2023 PCP: Nestor Banter, MD   Brief Narrative:  Jack White is a 83 y.o. male [employer] veteran with past medical history significant for HTN, HLD, DM2, CKD stage IIIa, possible Parkinson's disease, trigeminal neuralgia, BPH who presented to [facility] ED on 07/22/2023 from home via EMS after mechanical fall with associated headache, left-sided weakness, nausea/vomiting.  Code stroke was initiated.  Last known normal 2130 same day as admission.  Not on anticoagulation or antiplatelets outpatient.  Workup in the ED was notable for a large right subdural hematoma with 1.6 mm right-to-left midline shift.  Neurosurgery was consulted and taken for emergent right craniotomy; and admitted to the neuro ICU under the neurosurgical service with PCCM consulting.   Significant hospital events: 5/27: admit by neurosurgery for SDH s/p craniotomy, PCCM consulted; extubated 5/28: Intermittent agitation/confusion, difficulty sleeping 5/29: Restlessness overnight, sleeping poorly on a.m. assessment 5/30: Repeat CT head stable, transferred out of ICU; Eval by CIR>does not meet level of care 6/1: CCM reconsulted for hypoxia, CT angiogram chest negative for PE, small bilateral pleural effusions, multifocal groundglass opacities bilateral upper lobes, right middle lobe concerning for infectious process 6/2: re-eval by CIR> continues to not meet level of care and signed off 6/3: Encephalopathy improved, following commands for RN (thumbs up, wiggling toes, stating name)Fatigues quickly, but more awake than prior  6/4: Hemoglobin down to 6.9, 1 unit PRBC ordered 6/5: Creatinine rising, sodium improved with free water , IVC on POCUS collapsible with marked respiratory variation, IV fluids given; concern for possible aspiration event following swallow evaluation 6/6: Creatinine improved, stopped IV fluids; mental status waxing/waning; started on  Provigil  6/9: Transferred to [facility]; pain overnight, given dose of morphine  by PCCM coverage; awaiting MBS by SLP 6/10: MBS completed, started on clear liquid diet.  Assessment & Plan:   Principal Problem:   Status post surgery Active Problems:   Subdural hematoma (HCC)   Malnutrition of moderate degree  Traumatic subdural hematoma with midline shift s/p craniotomy Patient presenting to ED following mechanical fall with associated left-sided weakness, nausea/vomiting and confusion.  CT head with findings of a large right subdural hematoma with 1.6 mm right-to-left midline shift.  Neurosurgery was consulted and patient was taken emergently for right craniotomy.  Repeat CT head 5/28 with decreased right SDH, intracranial mass effect and left word midline shift, right subdural drain in place.  CT head 5/30 mild additional regression right SDH, decreasing leftward midline shift now 5 mm, right subdural drain removed.  CT head 5/31 residual subdural hematoma 1.5 cm in thickness with mass effect right cerebral hemisphere 5 to 6 mm right-to-left shift, smaller left-sided SDH without mass effect, not significant change from prior. -- Neuroprotective measures: HOB >30 degrees, normoglycemia, normothermia -- Continue therapy efforts; currently recommending SNF; TOC following -- Further per neurosurgery   Hyperkalemia Potassium higher to 5.7 today.  Start on Lokelma  10 mg daily.  Repeat potassium later.  Monitor on telemetry.   Dysphagia -- Continue tube feeding via Cortrak tube:  -- Aspiration precautions -- MBS completed 16 2025.  Started on clear liquid diet same day.  SLP is hopeful that he might improve and PEG tube may not be needed.  Will need to stay in the hospital for daily SLP evaluation as long as he is on tube feeds.   Acute renal failure on CKD stage IIIa -- Cr 1.50>>1.37>>1.99>2.17>>1.73>1.94>1.79> 1.66  (baseline 1.3 - 1.5) -- Avoid nephrotoxins, renal dose all  medications    Hypoactive delirium -- Started on Provigil  by PCCM -- Delirium precautions -- Get up during the day (chair) -- Encourage a familiar face to remain present throughout the day -- Keep blinds open and lights on during daylight hours -- Minimize the use of opioids/benzodiazepines   Aspiration pneumonia On 07/27/2023, patient became hypoxic.  CT angiogram chest negative for PE but with small bilateral pleural effusions and multifocal groundglass opacities bilateral upper lobes, right middle lobe concerning for infectious process, likely aspiration.  Completed 5-day course of Unasyn  on 08/01/2023.  Oxygen has been weaned off. -- Aspirate precautions   Anemia -- Hgb 10.9>>6.5>>8.1>>6.9>>8.5>8.1>8.1, stable -- transfused 1u pRBC on 07/30/2023   HTN On metoprolol  succinate 25 mg p.o. daily at baseline which is on hold.  Blood pressure controlled.   HLD -- Atorvastatin  10 mg per tube daily   DM2 Hemoglobin A1c 6.5%. -- Semglee  10 units Fisher daily -- NovoLog  5 units [place] every 4 hours -- Moderate SSI for coverage -- CBG every 4 hours while on tube feeds   Parkinson's disease -- Sinemet  1 tablet 3 times daily   Trigeminal neuralgia Follow-up with neurology outpatient, Dr. Winferd Hatter; last seen 07/16/2023. -- Tegretol  level 5.7 on 07/30/2023 -- Continue Tegretol  200 mg per tube TiD   BPH -- Doxazosin  4 mg per tube daily   Insomnia Reports patient takes 10 mg of melatonin and 50 mg of Benadryl  nightly at home. -- Increased melatonin to 10 mg nightly   Left heel ulcer, POA Sacral/coccyx skin tear Seen by [facility]iatry at [facility], underwent debridement on 5/22. -- Cleanse L heel wound with NS, apply Bacitracin  ointment to wound bed daily, cover with Telfa nonstick dressing and secure with silicone foam or Kerlix roll gauze whichever is preferred.  -- Apply sacral foam dressing, change every 3 days or PRN    Weakness/debility/deconditioning: -- Continue PT/OT efforts -- Will need SNF placement  DVT  prophylaxis: enoxaparin  (LOVENOX ) injection 40 mg Start: 07/28/23 1445 SCDs Start: 07/22/23 0217 Place and maintain sequential compression device Start: 07/22/23 0059   Code Status: Do not attempt resuscitation (DNR) PRE-ARREST INTERVENTIONS DESIRED  Family Communication:  None present at bedside.  Status is: Inpatient Remains inpatient appropriate because: Patient medically stable however failing swallow evaluation, requiring tube feeds, SNF will not take as long as he is on tube feeds.   Estimated body mass index is 30.89 kg/m as calculated from the following:   Height as of this encounter: 6' (1.829 m).   Weight as of this encounter: 103.3 kg.    Nutritional Assessment: Body mass index is 30.89 kg/m.Aaron Aas Seen by dietician.  I agree with the assessment and plan as outlined below: Nutrition Status: Nutrition Problem: Moderate Malnutrition Etiology: chronic illness Signs/Symptoms: moderate fat depletion, moderate muscle depletion Interventions: Tube feeding, Prostat, MVI  . Skin Assessment: I have examined the patient's skin and I agree with the wound assessment as performed by the wound care RN as outlined below:    Consultants:  Neurosurgery PCCM: Signed off 6/9  Procedures:  Right frontal parietal craniotomy, evacuation SDH, Dr. Nat Badger 5/27 Extubation 5/27 Cortrak placement 5/28  Antimicrobials:  Anti-infectives (From admission, onward)    Start     Dose/Rate Route Frequency Ordered Stop   07/27/23 1900  Ampicillin -Sulbactam (UNASYN ) 3 g in sodium chloride  0.9 % 100 mL IVPB  Status:  Discontinued        3 g 200 mL/hr over 30 Minutes Intravenous Every 6 hours 07/27/23 1801 08/01/23 0831  07/22/23 0600  ceFAZolin  (ANCEF ) IVPB 2g/100 mL premix        2 g 200 mL/hr over 30 Minutes Intravenous Every 8 hours 07/22/23 0217 07/22/23 1334         Subjective: Patient seen and examined, sitting in the recliner.  Has no complaints.  Appears to be alert but oriented to  self only.  Not talking much.  Objective: Vitals:   08/06/23 0500 08/06/23 0600 08/06/23 0700 08/06/23 0800  BP: (!) 107/53 119/62 122/68   Pulse: 82 86 88   Resp: 20 19 17    Temp:    (!) 97.1 F (36.2 C)  TempSrc:    Axillary  SpO2: 95% 95% 95%   Weight: 103.3 kg     Height:        Intake/Output Summary (Last 24 hours) at 08/06/2023 0844 Last data filed at 08/06/2023 0557 Gross per 24 hour  Intake 630 ml  Output 700 ml  Net -70 ml   Filed Weights   08/04/23 0500 08/05/23 0500 08/06/23 0500  Weight: 101 kg 103 kg 103.3 kg    Examination:  General exam: Appears calm and comfortable, appears chronically ill and weak. HEENT: Right frontal craniotomy incision site noted with no signs of infection. Respiratory system: Clear to auscultation. Respiratory effort normal. Cardiovascular system: S1 & S2 heard, RRR. No JVD, murmurs, rubs, gallops or clicks. No pedal edema. Gastrointestinal system: Abdomen is nondistended, soft and nontender. No organomegaly or masses felt. Normal bowel sounds heard. Central nervous system: Alert and oriented x 1-2.  Tracks.  Lifting right upper extremity but he is not lifting left upper extremity or bilateral lower extremities.  Unsure if he is understanding my commands.  He is fully alert though.  Data Reviewed: I have personally reviewed following labs and imaging studies  CBC: Recent Labs  Lab 08/01/23 0728 08/03/23 0454 08/04/23 0541 08/05/23 0523 08/06/23 0504  WBC 6.1 5.8 6.0 5.9 6.4  NEUTROABS  --  3.7 3.7  --   --   HGB 8.5* 8.1* 8.1* 7.9* 7.7*  HCT 27.6* 26.2* 26.3* 26.3* 25.0*  MCV 98.9 99.2 101.9* 101.2* 98.8  PLT 206 197 220 210 212   Basic Metabolic Panel: Recent Labs  Lab 08/02/23 0438 08/03/23 0454 08/04/23 0541 08/05/23 0523 08/06/23 0504  NA 141 142 143 140 139  K 5.1 5.2* 5.7* 5.4* 5.6*  CL 109 111 116* 108 107  CO2 26 26 20* 24 23  GLUCOSE 163* 124* 126* 137* 114*  BUN 79* 77* 82* 77* 76*  CREATININE 1.88*  1.73* 1.94* 1.79* 1.66*  CALCIUM  7.7* 8.1* 8.3* 8.0* 8.1*  MG  --   --  3.1* 3.1* 3.0*  PHOS  --   --  4.7* 5.3* 5.5*   GFR: Estimated Creatinine Clearance: 41.2 mL/min (A) (by C-G formula based on SCr of 1.66 mg/dL (H)). Liver Function Tests: Recent Labs  Lab 08/05/23 0523  AST 17  ALT 6  ALKPHOS 105  BILITOT 0.5  PROT 5.9*  ALBUMIN  2.4*   No results for input(s): LIPASE, AMYLASE in the last 168 hours. No results for input(s): AMMONIA in the last 168 hours. Coagulation Profile: No results for input(s): INR, PROTIME in the last 168 hours. Cardiac Enzymes: No results for input(s): CKTOTAL, CKMB, CKMBINDEX, TROPONINI in the last 168 hours. BNP (last 3 results) No results for input(s): PROBNP in the last 8760 hours. HbA1C: No results for input(s): HGBA1C in the last 72 hours. CBG: Recent Labs  Lab 08/05/23  1532 08/05/23 1943 08/05/23 2353 08/06/23 0358 08/06/23 0815  GLUCAP 109* 154* 130* 85 151*   Lipid Profile: No results for input(s): CHOL, HDL, LDLCALC, TRIG, CHOLHDL, LDLDIRECT in the last 72 hours. Thyroid  Function Tests: No results for input(s): TSH, T4TOTAL, FREET4, T3FREE, THYROIDAB in the last 72 hours. Anemia Panel: No results for input(s): VITAMINB12, FOLATE, FERRITIN, TIBC, IRON, RETICCTPCT in the last 72 hours. Sepsis Labs: No results for input(s): PROCALCITON, LATICACIDVEN in the last 168 hours.  Recent Results (from the past 240 hours)  Culture, blood (Routine X 2) w Reflex to ID Panel     Status: None   Collection Time: 07/27/23  6:52 PM   Specimen: BLOOD  Result Value Ref Range Status   Specimen Description BLOOD BLOOD LEFT ARM  Final   Special Requests   Final    AEROBIC BOTTLE ONLY Blood Culture results may not be optimal due to an inadequate volume of blood received in culture bottles   Culture   Final    NO GROWTH 5 DAYS Performed at [facility], [address]  [place], [place] [address]    Report Status 08/01/2023 FINAL  Final  Culture, blood (Routine X 2) w Reflex to ID Panel     Status: None   Collection Time: 07/27/23  6:54 PM   Specimen: BLOOD  Result Value Ref Range Status   Specimen Description BLOOD BLOOD LEFT HAND  Final   Special Requests   Final    AEROBIC BOTTLE ONLY Blood Culture results may not be optimal due to an inadequate volume of blood received in culture bottles   Culture   Final    NO GROWTH 5 DAYS Performed at [facility], [address], [place], [place] [address]    Report Status 08/01/2023 FINAL  Final     Radiology Studies: DG Swallowing Func-Speech Pathology Result Date: 08/05/2023 Table formatting from the original result was not included. Modified Barium Swallow Study Patient Details Name: Jack White MRN: [id number] Date of Birth: 1940-12-29 Today's Date: 08/05/2023 HPI/PMH: HPI: 34 yoM with PMH as below, significant for HTN, HLD, DMT2, possible parkinsons disease, trigeminal neuralgia, CKD 3a, and BPH, who presented after mechanical fall on 5/27, not on AC or antiplatelets who developed N/V, AMS, with left sided weakness found to have large right SDH with 1.36m right to left MLS.  Admitted to NSGY and taken for emergent right craniotomy.  Was seen by PCCM while in Neuro ICU, extubated 5/27 evening.  ICU stay complicated by hypoactive delirium, cortrak placed for meds/ nutrition.  pt transferred out of ICU 5/30, CTH unchanged 5/31.  Started to develop increased fever curve.  On 6/1 PCCM re-consulted for development on hypoxia, previously on room air with upper airway congestion.  Daughter reports has not been coughing and clearing secretion. Clinical Impression: Clinical Impression: Pt has an oral dysphagia with esophageal component, but his pharyngeal phase is generally intact. Oral phase is marked by reduced labial seal and disorganized lingual transit. There is anterior loss, intermittent premature spillage, and oral  residue that is mostly anteriorly located on the floor of his mouth and his bilateral buccal cavities. Mastication is slow with rest breaks (also question potential impact from loosely fitting lower dentures). Pharyngeal function was appropriate across all consistencies tested with no aspiration and no pharyngeal residue until the barium tablet, which was transiently lodged in the valleculae and then the UES. Once it entered into the esophagus it remained in the proximal  portion of the esophagus, not clearing with multiple trials of purees and thin liquids. It ultimately moved down to his distal esophagus when given sips of warm water . Note that other barium was present in the esophagus underneath the pill with some retrograde flow observed, but it did appear to clear more by the time the pill had descended more. Upon completion of the study, after being transferred back to bed, pt did have regurgitation of the barium he had just consumed. Question if esophageal component could have contributed although his esophagus had been mostly cleared prior to laying him back. His daughter was present for the study and notes that he had been reporting nausea this morning. Recommend starting slowly with clear liquid diet (thin liquids). If he can tolerate this well, anticipate that he will be able to advance up to Dys 2 (finely chopped) diet with use of aspiration and esophageal precautions. Would crush PO medications. RN and MD both updated on recommendations as well. Factors that may increase risk of adverse event in presence of aspiration Roderick Civatte & Jessy Morocco 2021): Factors that may increase risk of adverse event in presence of aspiration Roderick Civatte & Jessy Morocco 2021): Respiratory or GI disease; Reduced cognitive function; Limited mobility; Dependence for feeding and/or oral hygiene Recommendations/Plan: Swallowing Evaluation Recommendations Swallowing Evaluation Recommendations Recommendations: PO diet PO Diet Recommendation: Clear  liquid diet; Thin liquids (Level 0) Liquid Administration via: Cup; Straw Medication Administration: Crushed with puree Supervision: Staff to assist with self-feeding; Full supervision/cueing for swallowing strategies Swallowing strategies  : Slow rate; Small bites/sips; Check for anterior loss; Follow solids with liquids Postural changes: Position pt fully upright for meals; Stay upright 30-60 min after meals Oral care recommendations: Oral care BID (2x/day) Caregiver Recommendations: Have oral suction available Treatment Plan Treatment Plan Treatment recommendations: Therapy as outlined in treatment plan below Follow-up recommendations: Skilled nursing-short term rehab (<3 hours/day) Functional status assessment: Patient has had a recent decline in their functional status and demonstrates the ability to make significant improvements in function in a reasonable and predictable amount of time. Treatment frequency: Min 2x/week Treatment duration: 2 weeks Interventions: Aspiration precaution training; Compensatory techniques; Patient/family education; Trials of upgraded texture/liquids; Diet toleration management by SLP Recommendations Recommendations for follow up therapy are one component of a multi-disciplinary discharge planning process, led by the attending physician.  Recommendations may be updated based on patient status, additional functional criteria and insurance authorization. Assessment: Orofacial Exam: Orofacial Exam Oral Cavity - Dentition: Dentures, top; Dentures, bottom (bottom dentures loose) Anatomy: Anatomy: Suspected cervical osteophytes; Other (Comment) (naturally positioned in more of a chin tucked position) Boluses Administered: Boluses Administered Boluses Administered: Thin liquids (Level 0); Mildly thick liquids (Level 2, nectar thick); Puree; Solid  Oral Impairment Domain: Oral Impairment Domain Lip Closure: Escape beyond mid-chin Tongue control during bolus hold: Posterior escape of greater  than half of bolus Bolus preparation/mastication: Slow prolonged chewing/mashing with complete recollection Bolus transport/lingual motion: Repetitive/disorganized tongue motion Oral residue: Residue collection on oral structures Location of oral residue : Floor of mouth; Tongue; Lateral sulci Initiation of pharyngeal swallow : Pyriform sinuses (but mostly above the valleculae)  Pharyngeal Impairment Domain: Pharyngeal Impairment Domain Soft palate elevation: No bolus between soft palate (SP)/pharyngeal wall (PW) Laryngeal elevation: Complete superior movement of thyroid  cartilage with complete approximation of arytenoids to epiglottic petiole Anterior hyoid excursion: Complete anterior movement Epiglottic movement: Complete inversion Laryngeal vestibule closure: Complete, no air/contrast in laryngeal vestibule Pharyngeal stripping wave : Present - complete Pharyngeal contraction (A/P view only): N/A Pharyngoesophageal  segment opening: Complete distension and complete duration, no obstruction of flow Tongue base retraction: No contrast between tongue base and posterior pharyngeal wall (PPW) Pharyngeal residue: Complete pharyngeal clearance Location of pharyngeal residue: N/A  Esophageal Impairment Domain: Esophageal Impairment Domain Esophageal clearance upright position: Esophageal retention with retrograde flow through the PES Pill: Pill Consistency administered: Thin liquids (Level 0); Puree Thin liquids (Level 0): Impaired (see clinical impressions) Puree: Impaired (see clinical impressions) Penetration/Aspiration Scale Score: Penetration/Aspiration Scale Score 1.  Material does not enter airway: Solid; Puree; Pill 2.  Material enters airway, remains ABOVE vocal cords then ejected out: Thin liquids (Level 0); Mildly thick liquids (Level 2, nectar thick) Compensatory Strategies: Compensatory Strategies Compensatory strategies: No   General Information: Caregiver present: Yes (dtr, Sharon)  Diet Prior to this  Study: NPO; Cortrak/Small bore NG tube   Temperature : Normal   Respiratory Status: WFL   Supplemental O2: None (Room air)   History of Recent Intubation: Yes  Behavior/Cognition: Alert; Cooperative; Pleasant mood; Requires cueing Self-Feeding Abilities: Dependent for feeding Baseline vocal quality/speech: Normal No data recorded No data recorded Exam Limitations: No limitations Goal Planning: Prognosis for improved oropharyngeal function: Good Barriers to Reach Goals: Cognitive deficits No data recorded Patient/Family Stated Goal: family asking for POs for patient Consulted and agree with results and recommendations: Patient; Family member/caregiver; Physician; Nurse (dtr, Genevia Kern, present for study) Pain: Pain Assessment Pain Assessment: Faces Faces Pain Scale: 0 Facial Expression: 0 Body Movements: 0 Muscle Tension: 0 Compliance with ventilator (intubated pts.): N/A Vocalization (extubated pts.): 0 CPOT Total: 0 Pain Location: Moans with BLE movement Pain Descriptors / Indicators: Moaning Pain Intervention(s): Monitored during session End of Session: Start Time:SLP Start Time (ACUTE ONLY): 1344 Stop Time: SLP Stop Time (ACUTE ONLY): 1430 Time Calculation:SLP Time Calculation (min) (ACUTE ONLY): 46 min Charges: SLP Evaluations $ SLP Speech Visit: 1 Visit SLP Evaluations $MBS Swallow: 1 Procedure $Swallowing Treatment: 1 Procedure SLP visit diagnosis: SLP Visit Diagnosis: Dysphagia, unspecified (R13.10) Past Medical History: Past Medical History: Diagnosis Date  Acute cholecystitis 05/21/14  BP (high blood pressure) 10/14/2014  Chronic kidney disease (CKD), stage III (moderate) (HCC) 02/15/2014  Colon perforation (HCC) 05/28/2014  Diabetes mellitus without complication (HCC)   [name]'s neuralgia 10/14/2014  Headache   Hypertension   Intractable nausea and vomiting 04/07/2023  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 Luise Saint   COLOSTOMY TAKEDOWN N/A 10/26/2014  Procedure: COLOSTOMY TAKEDOWN;  Surgeon: Rhina Center III, MD;  Location: [facility] ORS;  Service: General;  Laterality: N/A;  CRANIOTOMY Right 07/21/2023  Procedure: CRANIOTOMY HEMATOMA EVACUATION SUBDURAL;  Surgeon: Augusto Blonder, MD;  Location: [facility] OR;  Service: Neurosurgery;  Laterality: Right;  EXPLORATORY LAPAROTOMY  05/28/2014  Dr. Luise Saint  LAPAROSCOPIC CHOLECYSTECTOMY  05/21/2014  Dr. Arthor Billet., M.A. CCC-SLP Acute Rehabilitation Services Office: [phone] Secure chat preferred 08/05/2023, 3:30 PM   Scheduled Meds:  acetaminophen   650 mg Per Tube Q6H   atorvastatin   10 mg Per Tube QHS   bacitracin    Topical Daily   carbamazepine   200 mg Per Tube TID   carbidopa -levodopa   1 tablet Per Tube TID   Chlorhexidine  Gluconate Cloth  6 each Topical Daily   diphenhydrAMINE   25 mg Oral QHS   docusate  100 mg Per Tube BID   doxazosin   4 mg Per Tube Daily   enoxaparin  (LOVENOX ) injection  40 mg Subcutaneous Q24H   famotidine   20 mg Per Tube Daily  feeding supplement (PROSource TF20)  60 mL Per Tube BID   free water   200 mL Per Tube Q4H   insulin  aspart  0-15 Units Subcutaneous Q4H   insulin  aspart  5 Units Subcutaneous Q4H   insulin  glargine-yfgn  10 Units Subcutaneous Daily   melatonin  10 mg Oral QHS   modafinil   100 mg Per Tube Daily   montelukast   10 mg Per Tube QHS   mouth rinse  15 mL Mouth Rinse 4 times per day   polyethylene glycol  17 g Per Tube Daily   sennosides  5 mL Per Tube QHS   sodium zirconium cyclosilicate   10 g Oral Daily   Continuous Infusions:  feeding supplement (OSMOLITE 1.5 CAL) 50 mL/hr at 08/06/23 0400     LOS: 15 days   Modena Andes, MD [facility]  08/06/2023, 8:44 AM   *Please note that this is a verbal dictation therefore any spelling or grammatical errors are due to the Dragon Medical One system interpretation.  Please page via Amion and do not message via secure chat for urgent patient care matters. Secure  chat can be used for non urgent patient care matters.  How to contact the [facility] Attending or Consulting provider 7A - 7P or covering provider during after hours 7P -7A, for this patient?  Check the care team in [facility] and look for a) attending/consulting [facility] provider listed and b) the [facility] team listed. Page or secure chat 7A-7P. Log into [email or web address] and use [facility]'s universal password to access. If you do not have the password, please contact the hospital operator. Locate the [facility] provider you are looking for under [facility] and page to a number that you can be directly reached. If you still have difficulty reaching the provider, please page the [facility] (Director on Call) for the Hospitalists listed on amion for assistance.

## 2023-08-06 NOTE — Progress Notes (Signed)
 Speech Language Pathology Treatment: Dysphagia  Patient Details Name: Jack White MRN: [id number] DOB: 1941/01/22 Today's Date: 08/06/2023 Time: 0935-1010 SLP Time Calculation (min) (ACUTE ONLY): 35 min  Assessment / Plan / Recommendation Clinical Impression  Patient seen by SLP for skilled treatment session focused on dysphagia goals. Daughter was present in room during session. Patient had just finished with PT/OT co-treatment session and patient was moved to recliner chair. He remained sitting up in recliner chair for duration of SLP session. He was initially very alert but started to get more lethargic during session. SLP observed him with PO's of thin liquids, including from a popsicle, and pudding with daughter feeding patient. No overt s/s aspiration observed during or after PO intake with overall amount being approximately a spoonful of pudding and a few sips of liquids. He did exhibit instances of delayed belching which daughter reported she has noticed for at least the last five days. She did state that prior to hospitalization, patient would just grab the Mylanta bottle when needed but he didn't take anything regularly for GERD. After discussion with patient's daughter and brief discussion with his RN, decision made to advance him to Dys 2 (minced) solids, thin liquids as per recommendations from yesterday's MBS. Daughter plans to start with items that are closer to pudding textures initially. Daughter did inquire about initiating interventions that would focus on patient's speech production as well. SLP will continue to follow. SLP is recommending: Consideration of trial of PPI for potential GERD. Reflux precautions should be followed including: sitting upright during meals and for 30-45 minutes after meals, keeping HOB elevated at least 30 degrees at all times.    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  Admitted to NSGY and taken for emergent right craniotomy.  Was seen by PCCM while in Neuro ICU, extubated 5/27 evening.  ICU stay complicated by hypoactive delirium, cortrak placed for meds/ nutrition.  pt transferred out of ICU 5/30, CTH unchanged 5/31.  Started to develop increased fever curve.  On 6/1 PCCM re-consulted for development on hypoxia, previously on room air with upper airway congestion.  Daughter reports has not been coughing and clearing secretion.      SLP Plan  Continue with current plan of care          Recommendations  Diet recommendations: Dysphagia 2 (fine chop);Thin liquid Liquids provided via: Cup;Straw Medication Administration: Crushed with puree Supervision: Full supervision/cueing for compensatory strategies;Trained caregiver to feed patient;Staff to assist with self feeding Compensations: Slow rate;Small sips/bites Postural Changes and/or Swallow Maneuvers: Seated upright 90 degrees                  Oral care before and after PO;Oral care BID     Dysphagia, unspecified (R13.10)     Continue with current plan of care     Jacqualine Mater, MA, CCC-SLP Speech Therapy

## 2023-08-07 DIAGNOSIS — Z9889 Other specified postprocedural states: Secondary | ICD-10-CM | POA: Diagnosis not present

## 2023-08-07 LAB — GLUCOSE, CAPILLARY
Glucose-Capillary: 113 mg/dL — ABNORMAL HIGH (ref 70–99)
Glucose-Capillary: 132 mg/dL — ABNORMAL HIGH (ref 70–99)
Glucose-Capillary: 136 mg/dL — ABNORMAL HIGH (ref 70–99)
Glucose-Capillary: 165 mg/dL — ABNORMAL HIGH (ref 70–99)
Glucose-Capillary: 76 mg/dL (ref 70–99)
Glucose-Capillary: 83 mg/dL (ref 70–99)

## 2023-08-07 MED ORDER — INSULIN ASPART 100 UNIT/ML IJ SOLN
5.0000 [IU] | INTRAMUSCULAR | Status: DC
  Administered 2023-08-07 – 2023-08-08 (×4): 5 [IU] via SUBCUTANEOUS

## 2023-08-07 NOTE — Progress Notes (Signed)
 Physical Therapy Treatment Patient Details Name: Jack White MRN: [id number] DOB: Sep 24, 1940 Today's Date: 08/07/2023   History of Present Illness Patient is an 83 y.o. male who presented to the ED via EMS 07/22/2023 as a code stroke after becoming unresponsive at home. He was intubated upon arrival. CT head showed large right holohemispheric acute SDH. Neurosurgery was consulted and patient underwent a  right craniotomy on 07/22/23. 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    PT Comments  Pt able to sustain alertness throughout duration of session; still with decreased command following and initiation of motor tasks. Pt developing hypertonicity of LUE. PT provided stretching prior to mobilization and encouraged daughter to assist with maintaining LUE in neutral positioning throughout the day. Pt requiring +2 assist for bed mobility. Utilized maxi sky from bed to chair and worked on sit to stands from chair. Pt continues with difficulty achieving upright posture and retropulsion and flexed posture. Patient will benefit from continued inpatient follow up therapy, <3 hours/day.   If plan is discharge home, recommend the following: Two people to help with walking and/or transfers;Two people to help with bathing/dressing/bathroom   Can travel by private vehicle     No  Equipment Recommendations  Other (comment) (defer)    Recommendations for Other Services       Precautions / Restrictions Precautions Precautions: Fall Recall of Precautions/Restrictions: Impaired Restrictions Weight Bearing Restrictions Per Provider Order: No     Mobility  Bed Mobility Overal bed mobility: Needs Assistance Bed Mobility: Rolling, Sidelying to Sit Rolling: Max assist, +2 for physical assistance Sidelying to sit: Max assist, +2 for physical assistance       General bed mobility comments: Decreased initiation by pt, despite max multimodal cueing. Management of  BLE and trunk, use of bed pad to slide hips out    Transfers Overall transfer level: Needs assistance Equipment used: Rolling walker (2 wheels) Transfers: Sit to/from Stand Sit to Stand: Max assist, +2 physical assistance           General transfer comment: Pt holding onto walker with R hand, provided anterior foot block, and use of bed pad as slip to boost hips up to standing position. Pt with heavy posterior lean and decreased ability to achieve upright posture despite max cueing and tactile facilitation    Ambulation/Gait               General Gait Details: unable   Stairs             Wheelchair Mobility     Tilt Bed    Modified Rankin (Stroke Patients Only) Modified Rankin (Stroke Patients Only) Pre-Morbid Rankin Score: Moderate disability Modified Rankin: Severe disability     Balance Overall balance assessment: Needs assistance Sitting-balance support: Feet supported Sitting balance-Leahy Scale: Poor Sitting balance - Comments: Left lateral lean, requiring minA   Standing balance support: Bilateral upper extremity supported Standing balance-Leahy Scale: Zero                              Communication Communication Communication: Impaired Factors Affecting Communication: Hearing impaired  Cognition Arousal: Alert Behavior During Therapy: Flat affect   PT - Cognitive impairments: Orientation, Memory, Awareness, Attention, Initiation, Sequencing, Problem solving, Safety/Judgement                         Following commands: Impaired Following commands impaired: Follows one  step commands inconsistently    Cueing Cueing Techniques: Verbal cues, Tactile cues, Visual cues  Exercises Other Exercises Other Exercises: Supine: bridging x 5 Other Exercises: Supine: LUE stretching to neutral    General Comments        Pertinent Vitals/Pain Pain Assessment Pain Assessment: Faces Faces Pain Scale: Hurts little more Pain  Location: moans with BLE movement Pain Descriptors / Indicators: Moaning, Sore Pain Intervention(s): Monitored during session    Home Living                          Prior Function            PT Goals (current goals can now be found in the care plan section) Acute Rehab PT Goals Patient Stated Goal: pt daughter agreeable to rehab Potential to Achieve Goals: Fair    Frequency    Min 2X/week      PT Plan      Co-evaluation              AM-PAC PT 6 Clicks Mobility   Outcome Measure  Help needed turning from your back to your side while in a flat bed without using bedrails?: Total Help needed moving from lying on your back to sitting on the side of a flat bed without using bedrails?: Total Help needed moving to and from a bed to a chair (including a wheelchair)?: Total Help needed standing up from a chair using your arms (e.g., wheelchair or bedside chair)?: Total Help needed to walk in hospital room?: Total Help needed climbing 3-5 steps with a railing? : Total 6 Click Score: 6    End of Session Equipment Utilized During Treatment: Gait belt Activity Tolerance: Patient tolerated treatment well Patient left: in chair;with call bell/phone within reach;with chair alarm set Nurse Communication: Mobility status;Need for lift equipment PT Visit Diagnosis: Unsteadiness on feet (R26.81);History of falling (Z91.81);Difficulty in walking, not elsewhere classified (R26.2);Pain     Time: 0981-1914 PT Time Calculation (min) (ACUTE ONLY): 34 min  Charges:    $Therapeutic Activity: 23-37 mins PT General Charges $$ ACUTE PT VISIT: 1 Visit                     Verdia Glad, PT, DPT Acute Rehabilitation Services Office [phone]    Claria Crofts 08/07/2023, 9:23 AM

## 2023-08-07 NOTE — Progress Notes (Addendum)
 PROGRESS NOTE    Jack White  ZOX:[id number] DOB: 08-02-1940 DOA: 07/21/2023 PCP: Nestor Banter, MD   Brief Narrative:  Jack White is a 83 y.o. male [employer] veteran with past medical history significant for HTN, HLD, DM2, CKD stage IIIa, possible Parkinson's disease, trigeminal neuralgia, BPH who presented to [facility] ED on 07/22/2023 from home via EMS after mechanical fall with associated headache, left-sided weakness, nausea/vomiting.  Code stroke was initiated.  Last known normal 2130 same day as admission.  Not on anticoagulation or antiplatelets outpatient.  Workup in the ED was notable for a large right subdural hematoma with 1.6 mm right-to-left midline shift.  Neurosurgery was consulted and taken for emergent right craniotomy; and admitted to the neuro ICU under the neurosurgical service with PCCM consulting.   Significant hospital events: 5/27: admit by neurosurgery for SDH s/p craniotomy, PCCM consulted; extubated 5/28: Intermittent agitation/confusion, difficulty sleeping 5/29: Restlessness overnight, sleeping poorly on a.m. assessment 5/30: Repeat CT head stable, transferred out of ICU; Eval by CIR>does not meet level of care 6/1: CCM reconsulted for hypoxia, CT angiogram chest negative for PE, small bilateral pleural effusions, multifocal groundglass opacities bilateral upper lobes, right middle lobe concerning for infectious process 6/2: re-eval by CIR> continues to not meet level of care and signed off 6/3: Encephalopathy improved, following commands for RN (thumbs up, wiggling toes, stating name)Fatigues quickly, but more awake than prior  6/4: Hemoglobin down to 6.9, 1 unit PRBC ordered 6/5: Creatinine rising, sodium improved with free water , IVC on POCUS collapsible with marked respiratory variation, IV fluids given; concern for possible aspiration event following swallow evaluation 6/6: Creatinine improved, stopped IV fluids; mental status waxing/waning; started on  Provigil  6/9: Transferred to [facility]; pain overnight, given dose of morphine  by PCCM coverage; awaiting MBS by SLP 6/10: MBS completed, started on clear liquid diet. 6/11: Advanced to dysphagia 2 diet.  Staples removed by neurosurgery.  Assessment & Plan:   Principal Problem:   Status post surgery Active Problems:   Subdural hematoma (HCC)   Malnutrition of moderate degree  Traumatic subdural hematoma with midline shift s/p craniotomy Patient presenting to ED following mechanical fall with associated left-sided weakness, nausea/vomiting and confusion.  CT head with findings of a large right subdural hematoma with 1.6 mm right-to-left midline shift.  Neurosurgery was consulted and patient was taken emergently for right craniotomy.  Repeat CT head 5/28 with decreased right SDH, intracranial mass effect and left word midline shift, right subdural drain in place.  CT head 5/30 mild additional regression right SDH, decreasing leftward midline shift now 5 mm, right subdural drain removed.  CT head 5/31 residual subdural hematoma 1.5 cm in thickness with mass effect right cerebral hemisphere 5 to 6 mm right-to-left shift, smaller left-sided SDH without mass effect, not significant change from prior. -- Neuroprotective measures: HOB >30 degrees, normoglycemia, normothermia -- Continue therapy efforts; currently recommending SNF; TOC following -- Staples removed by neurosurgery 08/07/2023.   Hyperkalemia Recheck later today.   Dysphagia -- Continue tube feeding via Cortrak tube:  -- Aspiration precautions -- MBS completed 16 2025.  Started on clear liquid diet same day.  SLP is hopeful that he might improve and PEG tube may not be needed.  Will need to stay in the hospital for daily SLP evaluation as long as he is on tube feeds.  Diet advanced to dysphagia 2 diet on 08/06/2023.   Acute renal failure on CKD stage IIIa -- Cr 1.50>>1.37>>1.99>2.17>>1.73>1.94>1.79> 1.66  (baseline 1.3 - 1.5) --  Avoid  nephrotoxins, renal dose all medications   Hypoactive delirium -- Started on Provigil  by PCCM -- Delirium precautions -- Get up during the day (chair) -- Encourage a familiar face to remain present throughout the day -- Keep blinds open and lights on during daylight hours -- Minimize the use of opioids/benzodiazepines   Aspiration pneumonia On 07/27/2023, patient became hypoxic.  CT angiogram chest negative for PE but with small bilateral pleural effusions and multifocal groundglass opacities bilateral upper lobes, right middle lobe concerning for infectious process, likely aspiration.  Completed 5-day course of Unasyn  on 08/01/2023.  Oxygen has been weaned off. -- Aspirate precautions   Anemia -- Hgb 10.9>>6.5>>8.1>>6.9>>8.5>8.1>8.1, stable -- transfused 1u pRBC on 07/30/2023   HTN On metoprolol  succinate 25 mg p.o. daily at baseline which is on hold.  Blood pressure controlled.   HLD -- Atorvastatin  10 mg per tube daily   DM2 Hemoglobin A1c 6.5%. -- Semglee  10 units [place] daily -- NovoLog  5 units [place] every 4 hours -- Moderate SSI for coverage -- CBG every 4 hours while on tube feeds   Parkinson's disease -- Sinemet  1 tablet 3 times daily   Trigeminal neuralgia Follow-up with neurology outpatient, Dr. Winferd Hatter; last seen 07/16/2023. -- Tegretol  level 5.7 on 07/30/2023 -- Continue Tegretol  200 mg per tube TiD   BPH -- Doxazosin  4 mg per tube daily   Insomnia Reports patient takes 10 mg of melatonin and 50 mg of Benadryl  nightly at home. -- Increased melatonin to 10 mg nightly   Left heel ulcer, POA Sacral/coccyx skin tear Seen by podiatry at [facility], underwent debridement on 5/22. -- Cleanse L heel wound with NS, apply Bacitracin  ointment to wound bed daily, cover with Telfa nonstick dressing and secure with silicone foam or Kerlix roll gauze whichever is preferred.  -- Apply sacral foam dressing, change every 3 days or PRN    Weakness/debility/deconditioning: -- Continue PT/OT  efforts -- Will need SNF placement  DVT prophylaxis: enoxaparin  (LOVENOX ) injection 40 mg Start: 07/28/23 1445 SCDs Start: 07/22/23 0217 Place and maintain sequential compression device Start: 07/22/23 0059   Code Status: Do not attempt resuscitation (DNR) PRE-ARREST INTERVENTIONS DESIRED  Family Communication:  None present at bedside.  Status is: Inpatient Remains inpatient appropriate because: Patient medically stable however failing swallow evaluation, requiring tube feeds, SNF will not take as long as he is on tube feeds.   Estimated body mass index is 31.87 kg/m as calculated from the following:   Height as of this encounter: 6' (1.829 m).   Weight as of this encounter: 106.6 kg.    Nutritional Assessment: Body mass index is 31.87 kg/m.Jack White Seen by dietician.  I agree with the assessment and plan as outlined below: Nutrition Status: Nutrition Problem: Moderate Malnutrition Etiology: chronic illness Signs/Symptoms: moderate fat depletion, moderate muscle depletion Interventions: Tube feeding, Prostat, MVI  . Skin Assessment: I have examined the patient's skin and I agree with the wound assessment as performed by the wound care RN as outlined below:    Consultants:  Neurosurgery PCCM: Signed off 6/9  Procedures:  Right frontal parietal craniotomy, evacuation SDH, Dr. Nat Badger 5/27 Extubation 5/27 Cortrak placement 5/28  Antimicrobials:  Anti-infectives (From admission, onward)    Start     Dose/Rate Route Frequency Ordered Stop   07/27/23 1900  Ampicillin -Sulbactam (UNASYN ) 3 g in sodium chloride  0.9 % 100 mL IVPB  Status:  Discontinued        3 g 200 mL/hr over 30 Minutes Intravenous Every 6 hours  07/27/23 1801 08/01/23 0831   07/22/23 0600  ceFAZolin  (ANCEF ) IVPB 2g/100 mL premix        2 g 200 mL/hr over 30 Minutes Intravenous Every 8 hours 07/22/23 0217 07/22/23 1334         Subjective: Patient seen and examined, patient very lethargic, nurse and  patient's daughter at the bedside, per nurse due to neurosurgery removing staples, patient was given morphine  about an hour before I saw him and that is why he was obtunded.  Otherwise reportedly patient was doing well per the nurse and the daughter.  Lengthy discussion with the daughter about the plan of care also the fact that patient does not require ICU level of care.  She was okay with moving him to the next-door and downgrade him to telemetry as long as somebody watches him more carefully than normal telemetry patient is typically voiced because she is concerned about recent bad experience with patient had event of aspiration and daughter believes that was because of lack of attention by the nursing staff.  Objective: Vitals:   08/07/23 0500 08/07/23 0525 08/07/23 0600 08/07/23 0700  BP:  (!) 138/59 120/63   Pulse: 86 76 77   Resp: (!) 23 17 17  (!) 21  Temp:      TempSrc:      SpO2: 95% 97% 98%   Weight:   106.6 kg   Height:        Intake/Output Summary (Last 24 hours) at 08/07/2023 0758 Last data filed at 08/07/2023 0700 Gross per 24 hour  Intake 3753 ml  Output 1350 ml  Net 2403 ml   Filed Weights   08/05/23 0500 08/06/23 0500 08/07/23 0600  Weight: 103 kg 103.3 kg 106.6 kg    Examination:  General exam: Appears lethargic but comfortable. Respiratory system: Diminished breath sounds due to poor inspiratory effort. Cardiovascular system: S1 & S2 heard, RRR. No JVD, murmurs, rubs, gallops or clicks. No pedal edema. Gastrointestinal system: Abdomen is nondistended, soft and nontender. No organomegaly or masses felt. Normal bowel sounds heard. Central nervous system: Lethargic.  Data Reviewed: I have personally reviewed following labs and imaging studies  CBC: Recent Labs  Lab 08/01/23 0728 08/03/23 0454 08/04/23 0541 08/05/23 0523 08/06/23 0504  WBC 6.1 5.8 6.0 5.9 6.4  NEUTROABS  --  3.7 3.7  --   --   HGB 8.5* 8.1* 8.1* 7.9* 7.7*  HCT 27.6* 26.2* 26.3* 26.3* 25.0*   MCV 98.9 99.2 101.9* 101.2* 98.8  PLT 206 197 220 210 212   Basic Metabolic Panel: Recent Labs  Lab 08/02/23 0438 08/03/23 0454 08/04/23 0541 08/05/23 0523 08/06/23 0504 08/06/23 1544  NA 141 142 143 140 139  --   K 5.1 5.2* 5.7* 5.4* 5.6* 5.4*  CL 109 111 116* 108 107  --   CO2 26 26 20* 24 23  --   GLUCOSE 163* 124* 126* 137* 114*  --   BUN 79* 77* 82* 77* 76*  --   CREATININE 1.88* 1.73* 1.94* 1.79* 1.66*  --   CALCIUM  7.7* 8.1* 8.3* 8.0* 8.1*  --   MG  --   --  3.1* 3.1* 3.0*  --   PHOS  --   --  4.7* 5.3* 5.5*  --    GFR: Estimated Creatinine Clearance: 41.8 mL/min (A) (by C-G formula based on SCr of 1.66 mg/dL (H)). Liver Function Tests: Recent Labs  Lab 08/05/23 0523  AST 17  ALT 6  ALKPHOS 105  BILITOT  0.5  PROT 5.9*  ALBUMIN  2.4*   No results for input(s): LIPASE, AMYLASE in the last 168 hours. No results for input(s): AMMONIA in the last 168 hours. Coagulation Profile: No results for input(s): INR, PROTIME in the last 168 hours. Cardiac Enzymes: No results for input(s): CKTOTAL, CKMB, CKMBINDEX, TROPONINI in the last 168 hours. BNP (last 3 results) No results for input(s): PROBNP in the last 8760 hours. HbA1C: No results for input(s): HGBA1C in the last 72 hours. CBG: Recent Labs  Lab 08/06/23 1136 08/06/23 1533 08/06/23 1953 08/06/23 2338 08/07/23 0338  GLUCAP 133* 72 92 146* 132*   Lipid Profile: No results for input(s): CHOL, HDL, LDLCALC, TRIG, CHOLHDL, LDLDIRECT in the last 72 hours. Thyroid  Function Tests: No results for input(s): TSH, T4TOTAL, FREET4, T3FREE, THYROIDAB in the last 72 hours. Anemia Panel: No results for input(s): VITAMINB12, FOLATE, FERRITIN, TIBC, IRON, RETICCTPCT in the last 72 hours. Sepsis Labs: No results for input(s): PROCALCITON, LATICACIDVEN in the last 168 hours.  No results found for this or any previous visit (from the past 240 hours).     Radiology Studies: DG Swallowing Func-Speech Pathology Result Date: 08/05/2023 Table formatting from the original result was not included. Modified Barium Swallow Study Patient Details Name: Jack White MRN: [id number] Date of Birth: 1940/06/07 Today's Date: 08/05/2023 HPI/PMH: HPI: 57 yoM with PMH as below, significant for HTN, HLD, DMT2, possible parkinsons disease, trigeminal neuralgia, CKD 3a, and BPH, who presented after mechanical fall on 5/27, not on AC or antiplatelets who developed N/V, AMS, with left sided weakness found to have large right SDH with 1.24m right to left MLS.  Admitted to NSGY and taken for emergent right craniotomy.  Was seen by PCCM while in Neuro ICU, extubated 5/27 evening.  ICU stay complicated by hypoactive delirium, cortrak placed for meds/ nutrition.  pt transferred out of ICU 5/30, CTH unchanged 5/31.  Started to develop increased fever curve.  On 6/1 PCCM re-consulted for development on hypoxia, previously on room air with upper airway congestion.  Daughter reports has not been coughing and clearing secretion. Clinical Impression: Clinical Impression: Pt has an oral dysphagia with esophageal component, but his pharyngeal phase is generally intact. Oral phase is marked by reduced labial seal and disorganized lingual transit. There is anterior loss, intermittent premature spillage, and oral residue that is mostly anteriorly located on the floor of his mouth and his bilateral buccal cavities. Mastication is slow with rest breaks (also question potential impact from loosely fitting lower dentures). Pharyngeal function was appropriate across all consistencies tested with no aspiration and no pharyngeal residue until the barium tablet, which was transiently lodged in the valleculae and then the UES. Once it entered into the esophagus it remained in the proximal portion of the esophagus, not clearing with multiple trials of purees and thin liquids. It ultimately moved down to his  distal esophagus when given sips of warm water . Note that other barium was present in the esophagus underneath the pill with some retrograde flow observed, but it did appear to clear more by the time the pill had descended more. Upon completion of the study, after being transferred back to bed, pt did have regurgitation of the barium he had just consumed. Question if esophageal component could have contributed although his esophagus had been mostly cleared prior to laying him back. His daughter was present for the study and notes that he had been reporting nausea this morning. Recommend starting slowly with clear liquid diet (thin liquids).  If he can tolerate this well, anticipate that he will be able to advance up to Dys 2 (finely chopped) diet with use of aspiration and esophageal precautions. Would crush PO medications. RN and MD both updated on recommendations as well. Factors that may increase risk of adverse event in presence of aspiration Roderick Civatte & Jessy Morocco 2021): Factors that may increase risk of adverse event in presence of aspiration Roderick Civatte & Jessy Morocco 2021): Respiratory or GI disease; Reduced cognitive function; Limited mobility; Dependence for feeding and/or oral hygiene Recommendations/Plan: Swallowing Evaluation Recommendations Swallowing Evaluation Recommendations Recommendations: PO diet PO Diet Recommendation: Clear liquid diet; Thin liquids (Level 0) Liquid Administration via: Cup; Straw Medication Administration: Crushed with puree Supervision: Staff to assist with self-feeding; Full supervision/cueing for swallowing strategies Swallowing strategies  : Slow rate; Small bites/sips; Check for anterior loss; Follow solids with liquids Postural changes: Position pt fully upright for meals; Stay upright 30-60 min after meals Oral care recommendations: Oral care BID (2x/day) Caregiver Recommendations: Have oral suction available Treatment Plan Treatment Plan Treatment recommendations: Therapy as outlined  in treatment plan below Follow-up recommendations: Skilled nursing-short term rehab (<3 hours/day) Functional status assessment: Patient has had a recent decline in their functional status and demonstrates the ability to make significant improvements in function in a reasonable and predictable amount of time. Treatment frequency: Min 2x/week Treatment duration: 2 weeks Interventions: Aspiration precaution training; Compensatory techniques; Patient/family education; Trials of upgraded texture/liquids; Diet toleration management by SLP Recommendations Recommendations for follow up therapy are one component of a multi-disciplinary discharge planning process, led by the attending physician.  Recommendations may be updated based on patient status, additional functional criteria and insurance authorization. Assessment: Orofacial Exam: Orofacial Exam Oral Cavity - Dentition: Dentures, top; Dentures, bottom (bottom dentures loose) Anatomy: Anatomy: Suspected cervical osteophytes; Other (Comment) (naturally positioned in more of a chin tucked position) Boluses Administered: Boluses Administered Boluses Administered: Thin liquids (Level 0); Mildly thick liquids (Level 2, nectar thick); Puree; Solid  Oral Impairment Domain: Oral Impairment Domain Lip Closure: Escape beyond mid-chin Tongue control during bolus hold: Posterior escape of greater than half of bolus Bolus preparation/mastication: Slow prolonged chewing/mashing with complete recollection Bolus transport/lingual motion: Repetitive/disorganized tongue motion Oral residue: Residue collection on oral structures Location of oral residue : Floor of mouth; Tongue; Lateral sulci Initiation of pharyngeal swallow : Pyriform sinuses (but mostly above the valleculae)  Pharyngeal Impairment Domain: Pharyngeal Impairment Domain Soft palate elevation: No bolus between soft palate (SP)/pharyngeal wall (PW) Laryngeal elevation: Complete superior movement of thyroid  cartilage with  complete approximation of arytenoids to epiglottic petiole Anterior hyoid excursion: Complete anterior movement Epiglottic movement: Complete inversion Laryngeal vestibule closure: Complete, no air/contrast in laryngeal vestibule Pharyngeal stripping wave : Present - complete Pharyngeal contraction (A/P view only): N/A Pharyngoesophageal segment opening: Complete distension and complete duration, no obstruction of flow Tongue base retraction: No contrast between tongue base and posterior pharyngeal wall (PPW) Pharyngeal residue: Complete pharyngeal clearance Location of pharyngeal residue: N/A  Esophageal Impairment Domain: Esophageal Impairment Domain Esophageal clearance upright position: Esophageal retention with retrograde flow through the PES Pill: Pill Consistency administered: Thin liquids (Level 0); Puree Thin liquids (Level 0): Impaired (see clinical impressions) Puree: Impaired (see clinical impressions) Penetration/Aspiration Scale Score: Penetration/Aspiration Scale Score 1.  Material does not enter airway: Solid; Puree; Pill 2.  Material enters airway, remains ABOVE vocal cords then ejected out: Thin liquids (Level 0); Mildly thick liquids (Level 2, nectar thick) Compensatory Strategies: Compensatory Strategies Compensatory strategies: No   General Information: Caregiver present:  Yes (dtr, Genevia Kern)  Diet Prior to this Study: NPO; Cortrak/Small bore NG tube   Temperature : Normal   Respiratory Status: WFL   Supplemental O2: None (Room air)   History of Recent Intubation: Yes  Behavior/Cognition: Alert; Cooperative; Pleasant mood; Requires cueing Self-Feeding Abilities: Dependent for feeding Baseline vocal quality/speech: Normal No data recorded No data recorded Exam Limitations: No limitations Goal Planning: Prognosis for improved oropharyngeal function: Good Barriers to Reach Goals: Cognitive deficits No data recorded Patient/Family Stated Goal: family asking for POs for patient Consulted and agree with  results and recommendations: Patient; Family member/caregiver; Physician; Nurse (dtr, Genevia Kern, present for study) Pain: Pain Assessment Pain Assessment: Faces Faces Pain Scale: 0 Facial Expression: 0 Body Movements: 0 Muscle Tension: 0 Compliance with ventilator (intubated pts.): N/A Vocalization (extubated pts.): 0 CPOT Total: 0 Pain Location: Moans with BLE movement Pain Descriptors / Indicators: Moaning Pain Intervention(s): Monitored during session End of Session: Start Time:SLP Start Time (ACUTE ONLY): 1344 Stop Time: SLP Stop Time (ACUTE ONLY): 1430 Time Calculation:SLP Time Calculation (min) (ACUTE ONLY): 46 min Charges: SLP Evaluations $ SLP Speech Visit: 1 Visit SLP Evaluations $MBS Swallow: 1 Procedure $Swallowing Treatment: 1 Procedure SLP visit diagnosis: SLP Visit Diagnosis: Dysphagia, unspecified (R13.10) Past Medical History: Past Medical History: Diagnosis Date  Acute cholecystitis 05/21/14  BP (high blood pressure) 10/14/2014  Chronic kidney disease (CKD), stage III (moderate) (HCC) 02/15/2014  Colon perforation (HCC) 05/28/2014  Diabetes mellitus without complication (HCC)   [name]'s neuralgia 10/14/2014  Headache   Hypertension   Intractable nausea and vomiting 04/07/2023  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 Luise Saint  COLOSTOMY TAKEDOWN N/A 10/26/2014  Procedure: COLOSTOMY TAKEDOWN;  Surgeon: Rhina Center III, MD;  Location: [facility] ORS;  Service: General;  Laterality: N/A;  CRANIOTOMY Right 07/21/2023  Procedure: CRANIOTOMY HEMATOMA EVACUATION SUBDURAL;  Surgeon: Augusto Blonder, MD;  Location: [facility] OR;  Service: Neurosurgery;  Laterality: Right;  EXPLORATORY LAPAROTOMY  05/28/2014  Dr. Luise Saint  LAPAROSCOPIC CHOLECYSTECTOMY  05/21/2014  Dr. Arthor Billet., M.A. CCC-SLP Acute Rehabilitation Services Office: [phone] Secure chat preferred 08/05/2023, 3:30 PM   Scheduled Meds:  acetaminophen   650 mg Per Tube  Q6H   atorvastatin   10 mg Per Tube QHS   bacitracin    Topical Daily   carbamazepine   200 mg Per Tube TID   carbidopa -levodopa   1 tablet Per Tube TID   Chlorhexidine  Gluconate Cloth  6 each Topical Daily   diphenhydrAMINE   25 mg Oral QHS   docusate  100 mg Per Tube BID   doxazosin   4 mg Per Tube Daily   enoxaparin  (LOVENOX ) injection  40 mg Subcutaneous Q24H   famotidine   20 mg Per Tube Daily   feeding supplement  237 mL Oral BID BM   feeding supplement (PROSource TF20)  60 mL Per Tube Daily   free water   100 mL Per Tube Q4H   insulin  aspart  0-15 Units Subcutaneous Q4H   insulin  aspart  5 Units Subcutaneous Q4H   insulin  glargine-yfgn  10 Units Subcutaneous Daily   melatonin  10 mg Oral QHS   modafinil   100 mg Per Tube Daily   montelukast   10 mg Per Tube QHS   mouth rinse  15 mL Mouth Rinse 4 times per day   polyethylene glycol  17 g Per Tube Daily   sennosides  5 mL Per Tube QHS   Continuous Infusions:  feeding supplement (OSMOLITE 1.5 CAL) Stopped (  08/07/23 0754)     LOS: 16 days   Modena Andes, MD [facility]  08/07/2023, 7:58 AM   *Please note that this is a verbal dictation therefore any spelling or grammatical errors are due to the Dragon Medical One system interpretation.  Please page via Amion and do not message via secure chat for urgent patient care matters. Secure chat can be used for non urgent patient care matters.  How to contact the [facility] Attending or Consulting provider 7A - 7P or covering provider during after hours 7P -7A, for this patient?  Check the care team in [facility] and look for a) attending/consulting [facility] provider listed and b) the [facility] team listed. Page or secure chat 7A-7P. Log into [email or web address] and use [place]'s universal password to access. If you do not have the password, please contact the hospital operator. Locate the [facility] provider you are looking for under [facility] and page to a number that you can be directly reached. If you  still have difficulty reaching the provider, please page the [facility] (Director on Call) for the Hospitalists listed on amion for assistance.

## 2023-08-07 NOTE — TOC Progression Note (Signed)
 Transition of Care [facility]) - Progression Note    Patient Details  Name: Jack White MRN: [id number] Date of Birth: 1940/09/08  Transition of Care [facility]) CM/SW Contact  Jannice Mends, LCSW Phone Number: 08/07/2023, 6:06 PM  Clinical Narrative:    CSW received request to speak with patient's daughter. CSW met with daughter at bedside and explained that SNF search will begin once Cortrak is removed. She requested [facility] and 2-[facility] if possible. Will continue to follow.    Expected Discharge Plan: Skilled Nursing Facility Barriers to Discharge: Continued Medical Work up, English as a second language teacher, SNF Pending bed offer  Expected Discharge Plan and Services In-house Referral: Clinical Social Work Discharge Planning Services: [employer] Consult Post Acute Care Choice: Skilled Nursing Facility Living arrangements for the past 2 months: Mobile Home                                       Social Determinants of Health (SDOH) Interventions SDOH Screenings   Food Insecurity: No Food Insecurity (07/22/2023)  Housing: Low Risk  (07/22/2023)  Transportation Needs: No Transportation Needs (07/22/2023)  Utilities: Not At Risk (07/22/2023)  Financial Resource Strain: Patient Declined (02/07/2023)   Received from [facility]  Social Connections: Moderately Integrated (07/24/2023)  Tobacco Use: Low Risk  (07/22/2023)  Recent Concern: Tobacco Use - Medium Risk (07/17/2023)   Received from [facility]    Readmission Risk Interventions     No data to display

## 2023-08-07 NOTE — Plan of Care (Signed)
  Problem: Activity: Goal: Ability to tolerate increased activity will improve Outcome: Not Progressing   Problem: Education: Goal: Knowledge of General Education information will improve Description: Including pain rating scale, medication(s)/side effects and non-pharmacologic comfort measures Outcome: Not Progressing   Problem: Safety: Goal: Ability to remain free from injury will improve Outcome: Progressing

## 2023-08-08 DIAGNOSIS — Z9889 Other specified postprocedural states: Secondary | ICD-10-CM | POA: Diagnosis not present

## 2023-08-08 LAB — CBC WITH DIFFERENTIAL/PLATELET
Abs Immature Granulocytes: 0.02 10*3/uL (ref 0.00–0.07)
Basophils Absolute: 0 10*3/uL (ref 0.0–0.1)
Basophils Relative: 0 %
Eosinophils Absolute: 0.5 10*3/uL (ref 0.0–0.5)
Eosinophils Relative: 8 %
HCT: 25.9 % — ABNORMAL LOW (ref 39.0–52.0)
Hemoglobin: 8 g/dL — ABNORMAL LOW (ref 13.0–17.0)
Immature Granulocytes: 0 %
Lymphocytes Relative: 16 %
Lymphs Abs: 1 10*3/uL (ref 0.7–4.0)
MCH: 31 pg (ref 26.0–34.0)
MCHC: 30.9 g/dL (ref 30.0–36.0)
MCV: 100.4 fL — ABNORMAL HIGH (ref 80.0–100.0)
Monocytes Absolute: 0.7 10*3/uL (ref 0.1–1.0)
Monocytes Relative: 11 %
Neutro Abs: 4 10*3/uL (ref 1.7–7.7)
Neutrophils Relative %: 65 %
Platelets: 220 10*3/uL (ref 150–400)
RBC: 2.58 MIL/uL — ABNORMAL LOW (ref 4.22–5.81)
RDW: 15.1 % (ref 11.5–15.5)
WBC: 6.2 10*3/uL (ref 4.0–10.5)
nRBC: 0 % (ref 0.0–0.2)

## 2023-08-08 LAB — GLUCOSE, CAPILLARY
Glucose-Capillary: 137 mg/dL — ABNORMAL HIGH (ref 70–99)
Glucose-Capillary: 148 mg/dL — ABNORMAL HIGH (ref 70–99)
Glucose-Capillary: 139 mg/dL — ABNORMAL HIGH (ref 70–99)
Glucose-Capillary: 136 mg/dL — ABNORMAL HIGH (ref 70–99)
Glucose-Capillary: 30 mg/dL — CL (ref 70–99)
Glucose-Capillary: 74 mg/dL (ref 70–99)

## 2023-08-08 LAB — BASIC METABOLIC PANEL WITH GFR
Anion gap: 7 (ref 5–15)
BUN: 68 mg/dL — ABNORMAL HIGH (ref 8–23)
CO2: 25 mmol/L (ref 22–32)
Calcium: 8.3 mg/dL — ABNORMAL LOW (ref 8.9–10.3)
Chloride: 106 mmol/L (ref 98–111)
Creatinine, Ser: 1.62 mg/dL — ABNORMAL HIGH (ref 0.61–1.24)
GFR, Estimated: 42 mL/min — ABNORMAL LOW (ref >60)
Glucose, Bld: 119 mg/dL — ABNORMAL HIGH (ref 70–99)
Potassium: 6 mmol/L — ABNORMAL HIGH (ref 3.5–5.1)
Sodium: 138 mmol/L (ref 135–145)

## 2023-08-08 LAB — POTASSIUM: Potassium: 5.3 mmol/L — ABNORMAL HIGH (ref 3.5–5.1)

## 2023-08-08 LAB — MAGNESIUM: Magnesium: 2.9 mg/dL — ABNORMAL HIGH (ref 1.7–2.4)

## 2023-08-08 MED ORDER — DEXTROSE 50 % IV SOLN
1.0000 | Freq: Once | INTRAVENOUS | Status: AC
  Administered 2023-08-08: 50 mL via INTRAVENOUS
  Filled 2023-08-08: qty 50

## 2023-08-08 MED ORDER — SODIUM ZIRCONIUM CYCLOSILICATE 10 G PO PACK
10.0000 g | PACK | Freq: Two times a day (BID) | ORAL | Status: DC
  Administered 2023-08-08: 10 g via ORAL
  Filled 2023-08-08 (×2): qty 1

## 2023-08-08 MED ORDER — DEXTROSE 50 % IV SOLN
25.0000 g | INTRAVENOUS | Status: AC
  Administered 2023-08-08: 25 g via INTRAVENOUS

## 2023-08-08 MED ORDER — INSULIN ASPART 100 UNIT/ML IV SOLN
10.0000 [IU] | Freq: Once | INTRAVENOUS | Status: AC
  Administered 2023-08-08: 10 [IU] via INTRAVENOUS

## 2023-08-08 MED ORDER — DEXTROSE 50 % IV SOLN
INTRAVENOUS | Status: AC
  Filled 2023-08-08: qty 50

## 2023-08-08 NOTE — Progress Notes (Signed)
 Speech Language Pathology Treatment: Dysphagia  Patient Details Name: Jack White MRN: [id number] DOB: 19-Feb-1941 Today's Date: 08/08/2023 Time: 4098-1191 SLP Time Calculation (min) (ACUTE ONLY): 30 min  Assessment / Plan / Recommendation Clinical Impression  Pt is sleepy this morning and showing signs of possible reflux. He will wake up to take small amounts of POs (spoonful of potato soup, a little water  and ice) and although cueing is needed more from a cognitive standpoint, he does not have overt signs of dysphagia or aspiration while swallowing. He does have frequent eructation, some facial grimacing, and has been asking for Maalox, per daughter, Genevia Kern. Question if his reflux is being optimally managed? This does appear to limit his desire for POs. Esophageal precautions are posted at [facility] and Genevia Kern is aware of them. SLP also reached out to RD about if any potential modifications can be made to current TF regimen to try to encourage more PO intake.   Genevia Kern also had several questions about current diet and has been running into some trouble ordering certain things from the cafeteria. Will plan to bring additional education to her about current diet so that if she wants to bring in foods that she knows Bill normally likes, she will have guidance on what to bring and how to modify it appropriately. Will leave on current diet for now and continue to follow.    HPI HPI: 19 yoM with PMH as below, significant for HTN, HLD, DMT2, possible parkinsons disease, trigeminal neuralgia, CKD 3a, and BPH, who presented after mechanical fall on 5/27, not on AC or antiplatelets who developed N/V, AMS, with left sided weakness found to have large right SDH with 1.81m right to left MLS.  Admitted to NSGY and taken for emergent right craniotomy.  Was seen by PCCM while in Neuro ICU, extubated 5/27 evening.  ICU stay complicated by hypoactive delirium, cortrak placed for meds/ nutrition.  pt transferred out of ICU  5/30, CTH unchanged 5/31.  Started to develop increased fever curve.  On 6/1 PCCM re-consulted for development on hypoxia, previously on room air with upper airway congestion.  Daughter reports has not been coughing and clearing secretion.      SLP Plan  Continue with current plan of care          Recommendations  Diet recommendations: Dysphagia 2 (fine chop);Thin liquid Liquids provided via: Cup;Straw Medication Administration: Crushed with puree (or can continue via tube) Supervision: Full supervision/cueing for compensatory strategies;Trained caregiver to feed patient;Staff to assist with self feeding Compensations: Slow rate;Small sips/bites Postural Changes and/or Swallow Maneuvers: Seated upright 90 degrees;Upright 30-60 min after meal                  Oral care before and after PO;Oral care BID     Dysphagia, unspecified (R13.10)     Continue with current plan of care     Beth Brooke., M.A. CCC-SLP Acute Rehabilitation Services Office: [phone]  Secure chat preferred   08/08/2023, 1:37 PM

## 2023-08-08 NOTE — Progress Notes (Signed)
 Occupational Therapy Treatment Patient Details Name: Jack White MRN: [id number] DOB: 03/22/1940 Today's Date: 08/08/2023   History of present illness Patient is an 83 y.o. male who presented to the ED via EMS 07/22/2023 as a code stroke after becoming unresponsive at home. He was intubated upon arrival. CT head showed large right holohemispheric acute SDH. Neurosurgery was consulted and patient underwent a  right craniotomy on 07/22/23. 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   OT comments  Pt up in recliner upon therapy arrival with eyes closed. Briefly opened eyes to verbal and tactile stimuli although unable to maintain conversation, provide feedback on presence of pain or make choice between staying in recliner or returning to bed. Due to increased lethargy and inability to follow commands during session, pt was unable to trial any AE that may assist with self feeding. Recommend attempting at a later session when pt is more alert and able to participate more. Session focused on direction following, sitting balance, and UB ROM. Patient will benefit from continued inpatient follow up therapy, <3 hours/day.        If plan is discharge home, recommend the following:  Other (comment) (total assist/care)         Precautions / Restrictions Precautions Precautions: Fall Recall of Precautions/Restrictions: Impaired Precaution/Restrictions Comments: JP drain Restrictions Weight Bearing Restrictions Per Provider Order: No          Balance Overall balance assessment: Needs assistance Sitting-balance support: Feet supported Sitting balance-Leahy Scale: Poor Sitting balance - Comments: Completed seated lateral leans right and left while seated in recliner with total assist. Postural control: Left lateral lean        ADL either performed or assessed with clinical judgement   ADL       Grooming: Wash/dry face;Total  assistance;Sitting Grooming Details (indicate cue type and reason): Pt would not attempt to grasp washcloth in his right hand and wash face when provided with VC or tactile cues         Extremity/Trunk Assessment Upper Extremity Assessment LUE Deficits / Details: resistive to any movement as OT attempted to complete passive and active assistive ROM. Volitional hypertonicity                     Communication Communication Communication: Impaired Factors Affecting Communication: Hearing impaired;Reduced clarity of speech   Cognition Arousal: Lethargic Behavior During Therapy: Flat affect Cognition: Cognition impaired Difficult to assess due to: Impaired communication Orientation impairments: Place, Time, Situation Awareness: Intellectual awareness impaired, Online awareness impaired Memory impairment (select all impairments): Short-term memory, Working memory, Non-declarative long-term memory, Geneticist, molecular long-term memory Attention impairment (select first level of impairment): Focused attention Executive functioning impairment (select all impairments): Initiation, Organization, Sequencing, Reasoning, Problem solving OT - Cognition Comments: Difficulty following any commands during session. Pt spent majority of session moaning or with eyes closed. Opened eyes briefly during session.  Following commands: Impaired Following commands impaired:  (followed no commands during session)      Cueing   Cueing Techniques: Tactile cues  Exercises General Exercises - Upper Extremity Shoulder Flexion: AAROM, Both, 5 reps, Seated Elbow Flexion: PROM, Left, 5 reps, Seated Elbow Extension: PROM, Left, 5 reps, Seated Wrist Flexion: PROM, Left, 5 reps, Seated Wrist Extension: PROM, Left, 5 reps, Seated            Pertinent Vitals/ Pain       Pain Assessment Pain Assessment: Faces Faces Pain Scale: Hurts little more Pain Location: generalized with  touch and movement of any kind. Unable  to verbalize where pain is located. Pain Descriptors / Indicators: Grimacing, Discomfort, Moaning Pain Intervention(s): Limited activity within patient's tolerance, Monitored during session, Repositioned  Home Living     Available Help at Discharge: Family Type of Home: Mobile home           Frequency  Min 2X/week        Progress Toward Goals  OT Goals(current goals can now be found in the care plan section)  Progress towards OT goals: Not progressing toward goals - comment (limited by theragy and fatgue this session with increased difficulty following commands)            AM-PAC OT 6 Clicks Daily Activity     Outcome Measure   Help from another person eating meals?: Total Help from another person taking care of personal grooming?: Total Help from another person toileting, which includes using toliet, bedpan, or urinal?: Total Help from another person bathing (including washing, rinsing, drying)?: Total Help from another person to put on and taking off regular upper body clothing?: Total Help from another person to put on and taking off regular lower body clothing?: Total 6 Click Score: 6    End of Session    OT Visit Diagnosis: Unsteadiness on feet (R26.81);Muscle weakness (generalized) (M62.81);History of falling (Z91.81);Other symptoms and signs involving cognitive function   Activity Tolerance Patient limited by lethargy;Patient limited by fatigue   Patient Left in chair;with call bell/phone within reach (no chair alarm under pt. Nursing aware)   Nurse Communication Mobility status        Time: 1422-1500 OT Time Calculation (min): 38 min  Charges: OT General Charges $OT Visit: 1 Visit OT Treatments $Therapeutic Activity: 38-52 mins  Jack White, OTR/L,CBIS  Supplemental OT - [facility] and [facility] Secure Chat Preferred    Jack White, Jack White 08/08/2023, 4:22 PM

## 2023-08-08 NOTE — Evaluation (Signed)
 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   S/P colostomy (HCC)    Acute cholecystitis 10/14/2014   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   Colon perforation (HCC) 10/14/2014   H/O colostomy 10/14/2014   Diabetes mellitus, type 2 (HCC) 10/14/2014   BP (high blood pressure) 10/14/2014   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   [name]'s neuralgia 10/14/2014   Gangrenous cholecystitis 06/27/2014   Chronic kidney disease (CKD), stage III (moderate) (HCC) 02/15/2014   Fungal infection of toenail 07/30/2013   Past Medical History:  Past Medical History:  Diagnosis Date   Acute cholecystitis 05/21/14   BP (high blood pressure) 10/14/2014   Chronic kidney disease (CKD), stage III (moderate) (HCC) 02/15/2014   Colon perforation (HCC) 05/28/2014   Diabetes mellitus without complication (HCC)    [name]'s neuralgia 10/14/2014   Headache    Hypertension    Intractable nausea and vomiting 04/07/2023   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 Luise Saint   COLOSTOMY TAKEDOWN N/A 10/26/2014   Procedure: COLOSTOMY TAKEDOWN;  Surgeon: Rhina Center III, MD;  Location: [facility] ORS;  Service: General;  Laterality: N/A;   CRANIOTOMY Right 07/21/2023   Procedure: CRANIOTOMY HEMATOMA EVACUATION SUBDURAL;  Surgeon: Augusto Blonder, MD;  Location[facility] OR;  Service: Neurosurgery;  Laterality: Right;   EXPLORATORY  LAPAROTOMY  05/28/2014   Dr. Luise Saint   LAPAROSCOPIC CHOLECYSTECTOMY  05/21/2014   Dr. Amalia Badder   HPI:  24 yoM with PMH as below, significant for HTN, HLD, DMT2, possible parkinsons disease, trigeminal neuralgia, CKD 3a, and BPH, who presented after mechanical fall on 5/27, not on AC or antiplatelets who developed N/V, AMS, with left sided weakness found to have large right SDH with 1.36m right to left MLS.  Admitted to NSGY and taken for emergent right craniotomy.  Was seen by PCCM while in Neuro ICU, extubated 5/27 evening.  ICU stay complicated by hypoactive delirium, cortrak placed for meds/ nutrition.  pt transferred out of ICU 5/30, CTH unchanged 5/31.  Started to develop increased fever curve.  On 6/1 PCCM re-consulted for development on hypoxia, previously on room air with upper airway congestion.  Daughter reports has not been coughing and clearing secretion.   Assessment / Plan / Recommendation Clinical Impression  Pt is quite sleepy this morning, but can be awakened with stimulation especially when it is familiar to him (favorite music) or noxious. He followed a few one-step commands for me, and sustained attention briefly to familiar functional tasks before he would start to fall asleep or lose focus. He didn't respond to orientation questions, open-ended questions, or divergent naming prompts, but he did communicate more during spontaneous conversation with his daughter. His speech is dysarthric even with words and phrases, but intelligibility increases some when his volume increases or in moments when he is more optimally alert. Given this acute change in cognition and communication, recommend SLP f/u acutely and at next level of care. Will plan for ongoing assessment  as alertness improves.     SLP Assessment  SLP Recommendation/Assessment: Patient needs continued Speech Language Pathology Services SLP Visit Diagnosis: Dysarthria and anarthria (R47.1);Cognitive communication deficit (R41.841)      Assistance Recommended at Discharge  Frequent or constant Supervision/Assistance  Functional Status Assessment Patient has had a recent decline in their functional status and demonstrates the ability to make significant improvements in function in a reasonable and predictable amount of time.  Frequency and Duration min 2x/week  2 weeks      SLP Evaluation Cognition  Overall Cognitive Status: Impaired/Different from baseline Arousal/Alertness: Lethargic Orientation Level: Oriented to person Attention: Sustained Sustained Attention: Impaired Sustained Attention Impairment: Verbal basic;Functional basic       Comprehension  Auditory Comprehension Overall Auditory Comprehension: Impaired Commands: Impaired One Step Basic Commands: 25-49% accurate Interfering Components: Attention;Other (comment) (level of alertness)    Expression Expression Primary Mode of Expression: Verbal Verbal Expression Overall Verbal Expression: Other (comment) (needs more assessment)   Oral / Motor  Motor Speech Overall Motor Speech: Impaired Respiration: Within functional limits Phonation: Normal Resonance: Within functional limits Articulation: Impaired Level of Impairment: Word Intelligibility: Intelligibility reduced Word: 50-74% accurate Phrase: 50-74% accurate Effective Techniques: Increased vocal intensity            Beth Brooke., M.A. CCC-SLP Acute Rehabilitation Services Office: [phone]  Secure chat preferred  08/08/2023, 1:59 PM

## 2023-08-08 NOTE — Care Management Important Message (Signed)
 Important Message  Patient Details  Name: Jack White MRN: [id number] Date of Birth: 03-Dec-1940   Important Message Given:  Yes - Medicare IM     Wynonia Hedges 08/08/2023, 3:15 PM

## 2023-08-08 NOTE — Progress Notes (Signed)
 Pt is now 2 weeks s/p right craniotomy for evacuation of acute SDH. I will arrange for outpatient CT head w/o in about 1-2 weeks and follow-up in my clinic.  Augusto Blonder, MD [facility]

## 2023-08-08 NOTE — Progress Notes (Signed)
 PROGRESS NOTE    COLBURN ASPER  ZOX:[id number] DOB: 1941/01/02 DOA: 07/21/2023 PCP: Nestor Banter, MD   Brief Narrative:  Jack White is a 83 y.o. male [employer] veteran with past medical history significant for HTN, HLD, DM2, CKD stage IIIa, possible Parkinson's disease, trigeminal neuralgia, BPH who presented to [facility] ED on 07/22/2023 from home via EMS after mechanical fall with associated headache, left-sided weakness, nausea/vomiting.  Code stroke was initiated.  Last known normal 2130 same day as admission.  Not on anticoagulation or antiplatelets outpatient.  Workup in the ED was notable for a large right subdural hematoma with 1.6 mm right-to-left midline shift.  Neurosurgery was consulted and taken for emergent right craniotomy; and admitted to the neuro ICU under the neurosurgical service with PCCM consulting.   Significant hospital events: 5/27: admit by neurosurgery for SDH s/p craniotomy, PCCM consulted; extubated 5/28: Intermittent agitation/confusion, difficulty sleeping 5/29: Restlessness overnight, sleeping poorly on a.m. assessment 5/30: Repeat CT head stable, transferred out of ICU; Eval by CIR>does not meet level of care 6/1: CCM reconsulted for hypoxia, CT angiogram chest negative for PE, small bilateral pleural effusions, multifocal groundglass opacities bilateral upper lobes, right middle lobe concerning for infectious process 6/2: re-eval by CIR> continues to not meet level of care and signed off 6/3: Encephalopathy improved, following commands for RN (thumbs up, wiggling toes, stating name)Fatigues quickly, but more awake than prior  6/4: Hemoglobin down to 6.9, 1 unit PRBC ordered 6/5: Creatinine rising, sodium improved with free water , IVC on POCUS collapsible with marked respiratory variation, IV fluids given; concern for possible aspiration event following swallow evaluation 6/6: Creatinine improved, stopped IV fluids; mental status waxing/waning; started on  Provigil  6/9: Transferred to [facility]; pain overnight, given dose of morphine  by PCCM coverage; awaiting MBS by SLP 6/10: MBS completed, started on clear liquid diet. 6/11: Advanced to dysphagia 2 diet.  Staples removed by neurosurgery.  Transitioned to nocturnal feeds only. 6/12.  Transferred to med telemetry floor.  Assessment & Plan:   Principal Problem:   Status post surgery Active Problems:   Subdural hematoma (HCC)   Malnutrition of moderate degree  Traumatic subdural hematoma with midline shift s/p craniotomy Patient presenting to ED following mechanical fall with associated left-sided weakness, nausea/vomiting and confusion.  CT head with findings of a large right subdural hematoma with 1.6 mm right-to-left midline shift.  Neurosurgery was consulted and patient was taken emergently for right craniotomy.  Repeat CT head 5/28 with decreased right SDH, intracranial mass effect and left word midline shift, right subdural drain in place.  CT head 5/30 mild additional regression right SDH, decreasing leftward midline shift now 5 mm, right subdural drain removed.  CT head 5/31 residual subdural hematoma 1.5 cm in thickness with mass effect right cerebral hemisphere 5 to 6 mm right-to-left shift, smaller left-sided SDH without mass effect, not significant change from prior. -- Neuroprotective measures: HOB >30 degrees, normoglycemia, normothermia -- Continue therapy efforts; currently recommending SNF; TOC following -- Staples removed by neurosurgery 08/07/2023.   Hyperkalemia Higher than before, 6.0.  Will give any dextrose  with insulin  now as well as start him on Lokelma  10 mg p.o. twice daily x 2 doses today.  Monitor on telemetry.  Potassium at 6 PM.  Act accordingly.   Dysphagia -- Continue tube feeding via Cortrak tube:  -- Aspiration precautions -- MBS completed 16 2025.  Started on clear liquid diet same day.  SLP is hopeful that he might improve and PEG tube may not  be needed.  Will need  to stay in the hospital for daily SLP evaluation as long as he is on tube feeds.  Diet advanced to dysphagia 2 diet on 08/06/2023.  Transitioned to nocturnal feeds same day.  Daughter has a lot of questions about the feeds, I have conveyed the message to the pharmacy and dietitian to talk to the daughter.   Acute renal failure on CKD stage IIIa -- Cr 1.50>>1.37>>1.99>2.17>>1.73>1.94>1.79> 1.66  (baseline 1.3 - 1.5) -- Avoid nephrotoxins, renal dose all medications   Hypoactive delirium -- Started on Provigil  by PCCM -- Delirium precautions -- Get up during the day (chair) -- Encourage a familiar face to remain present throughout the day -- Keep blinds open and lights on during daylight hours -- Minimize the use of opioids/benzodiazepines   Aspiration pneumonia On 07/27/2023, patient became hypoxic.  CT angiogram chest negative for PE but with small bilateral pleural effusions and multifocal groundglass opacities bilateral upper lobes, right middle lobe concerning for infectious process, likely aspiration.  Completed 5-day course of Unasyn  on 08/01/2023.  Oxygen has been weaned off. -- Aspirate precautions   Anemia -- Hgb 10.9>>6.5>>8.1>>6.9>>8.5> and has been stable ever since -- transfused 1u pRBC on 07/30/2023   HTN On metoprolol  succinate 25 mg p.o. daily at baseline which is on hold.  Blood pressure controlled.   HLD -- Atorvastatin  10 mg per tube daily   DM2 Hemoglobin A1c 6.5%. -- Semglee  10 units [place] daily -- NovoLog  5 units [place] every 4 hours -- Moderate SSI for coverage -- CBG every 4 hours while on tube feeds   Parkinson's disease -- Sinemet  1 tablet 3 times daily   Trigeminal neuralgia Follow-up with neurology outpatient, Dr. Winferd Hatter; last seen 07/16/2023. -- Tegretol  level 5.7 on 07/30/2023 -- Continue Tegretol  200 mg per tube TiD   BPH -- Doxazosin  4 mg per tube daily   Insomnia Reports patient takes 10 mg of melatonin and 50 mg of Benadryl  nightly at home. -- Increased  melatonin to 10 mg nightly.  No issues of insomnia anymore that are happening from.   Left heel ulcer, POA Sacral/coccyx skin tear Seen by podiatry at [facility], underwent debridement on 5/22. -- Cleanse L heel wound with NS, apply Bacitracin  ointment to wound bed daily, cover with Telfa nonstick dressing and secure with silicone foam or Kerlix roll gauze whichever is preferred.  -- Apply sacral foam dressing, change every 3 days or PRN    Weakness/debility/deconditioning: -- Continue PT/OT efforts -- Will need SNF placement  DVT prophylaxis: enoxaparin  (LOVENOX ) injection 40 mg Start: 07/28/23 1445 SCDs Start: 07/22/23 0217 Place and maintain sequential compression device Start: 07/22/23 0059   Code Status: Do not attempt resuscitation (DNR) PRE-ARREST INTERVENTIONS DESIRED  Family Communication: Daughter present at bedside.  Status is: Inpatient Remains inpatient appropriate because: Patient medically stable however requiring tube feeds, SNF will not take as long as he is on tube feeds.   Estimated body mass index is 31.9 kg/m as calculated from the following:   Height as of this encounter: 6' (1.829 m).   Weight as of this encounter: 106.7 kg.    Nutritional Assessment: Body mass index is 31.9 kg/m.Aaron Aas Seen by dietician.  I agree with the assessment and plan as outlined below: Nutrition Status: Nutrition Problem: Moderate Malnutrition Etiology: chronic illness Signs/Symptoms: moderate fat depletion, moderate muscle depletion Interventions: Tube feeding, Prostat, MVI  . Skin Assessment: I have examined the patient's skin and I agree with the wound assessment as performed by  the wound care RN as outlined below:    Consultants:  Neurosurgery PCCM: Signed off 6/9  Procedures:  Right frontal parietal craniotomy, evacuation SDH, Dr. Nat Badger 5/27 Extubation 5/27 Cortrak placement 5/28  Antimicrobials:  Anti-infectives (From admission, onward)    Start     Dose/Rate Route  Frequency Ordered Stop   07/27/23 1900  Ampicillin -Sulbactam (UNASYN ) 3 g in sodium chloride  0.9 % 100 mL IVPB  Status:  Discontinued        3 g 200 mL/hr over 30 Minutes Intravenous Every 6 hours 07/27/23 1801 08/01/23 0831   07/22/23 0600  ceFAZolin  (ANCEF ) IVPB 2g/100 mL premix        2 g 200 mL/hr over 30 Minutes Intravenous Every 8 hours 07/22/23 0217 07/22/23 1334         Subjective: Seen and examined.  Patient is much more alert and oriented today.  He has no complaints.  Daughter at the bedside.  She has a lot of questions and concerns regarding tube feeds.  Objective: Vitals:   08/07/23 2339 08/08/23 0418 08/08/23 0500 08/08/23 0755  BP: (!) 102/51 (!) 95/39  (!) 109/51  Pulse: 66 68  71  Resp: 18 18  19   Temp: 97.6 F (36.4 C)     TempSrc: Oral     SpO2: 99% 98%  97%  Weight:   106.7 kg   Height:        Intake/Output Summary (Last 24 hours) at 08/08/2023 1240 Last data filed at 08/08/2023 0657 Gross per 24 hour  Intake 0 ml  Output 1100 ml  Net -1100 ml   Filed Weights   08/06/23 0500 08/07/23 0600 08/08/23 0500  Weight: 103.3 kg 106.6 kg 106.7 kg    Examination:  General exam: Appears calm and comfortable  Respiratory system: Clear to auscultation. Respiratory effort normal. Cardiovascular system: S1 & S2 heard, RRR. No JVD, murmurs, rubs, gallops or clicks. No pedal edema. Gastrointestinal system: Abdomen is nondistended, soft and nontender. No organomegaly or masses felt. Normal bowel sounds heard. Central nervous system: Alert and oriented.  Left hemiparesis.  Data Reviewed: I have personally reviewed following labs and imaging studies  CBC: Recent Labs  Lab 08/03/23 0454 08/04/23 0541 08/05/23 0523 08/06/23 0504 08/08/23 1100  WBC 5.8 6.0 5.9 6.4 6.2  NEUTROABS 3.7 3.7  --   --  4.0  HGB 8.1* 8.1* 7.9* 7.7* 8.0*  HCT 26.2* 26.3* 26.3* 25.0* 25.9*  MCV 99.2 101.9* 101.2* 98.8 100.4*  PLT 197 220 210 212 220   Basic Metabolic Panel: Recent  Labs  Lab 08/03/23 0454 08/04/23 0541 08/05/23 0523 08/06/23 0504 08/06/23 1544 08/08/23 1100  NA 142 143 140 139  --  138  K 5.2* 5.7* 5.4* 5.6* 5.4* 6.0*  CL 111 116* 108 107  --  106  CO2 26 20* 24 23  --  25  GLUCOSE 124* 126* 137* 114*  --  119*  BUN 77* 82* 77* 76*  --  68*  CREATININE 1.73* 1.94* 1.79* 1.66*  --  1.62*  CALCIUM  8.1* 8.3* 8.0* 8.1*  --  8.3*  MG  --  3.1* 3.1* 3.0*  --  2.9*  PHOS  --  4.7* 5.3* 5.5*  --   --    GFR: Estimated Creatinine Clearance: 42.8 mL/min (A) (by C-G formula based on SCr of 1.62 mg/dL (H)). Liver Function Tests: Recent Labs  Lab 08/05/23 0523  AST 17  ALT 6  ALKPHOS 105  BILITOT 0.5  PROT  5.9*  ALBUMIN  2.4*   No results for input(s): LIPASE, AMYLASE in the last 168 hours. No results for input(s): AMMONIA in the last 168 hours. Coagulation Profile: No results for input(s): INR, PROTIME in the last 168 hours. Cardiac Enzymes: No results for input(s): CKTOTAL, CKMB, CKMBINDEX, TROPONINI in the last 168 hours. BNP (last 3 results) No results for input(s): PROBNP in the last 8760 hours. HbA1C: No results for input(s): HGBA1C in the last 72 hours. CBG: Recent Labs  Lab 08/07/23 1604 08/07/23 1947 08/07/23 2341 08/08/23 0420 08/08/23 0755  GLUCAP 83 113* 165* 137* 148*   Lipid Profile: No results for input(s): CHOL, HDL, LDLCALC, TRIG, CHOLHDL, LDLDIRECT in the last 72 hours. Thyroid  Function Tests: No results for input(s): TSH, T4TOTAL, FREET4, T3FREE, THYROIDAB in the last 72 hours. Anemia Panel: No results for input(s): VITAMINB12, FOLATE, FERRITIN, TIBC, IRON, RETICCTPCT in the last 72 hours. Sepsis Labs: No results for input(s): PROCALCITON, LATICACIDVEN in the last 168 hours.  No results found for this or any previous visit (from the past 240 hours).    Radiology Studies: No results found.   Scheduled Meds:  acetaminophen   650 mg Per Tube Q6H    atorvastatin   10 mg Per Tube QHS   bacitracin    Topical Daily   carbamazepine   200 mg Per Tube TID   carbidopa -levodopa   1 tablet Per Tube TID   Chlorhexidine  Gluconate Cloth  6 each Topical Daily   diphenhydrAMINE   25 mg Oral QHS   docusate  100 mg Per Tube BID   doxazosin   4 mg Per Tube Daily   enoxaparin  (LOVENOX ) injection  40 mg Subcutaneous Q24H   famotidine   20 mg Per Tube Daily   feeding supplement  237 mL Oral BID BM   feeding supplement (PROSource TF20)  60 mL Per Tube Daily   free water   100 mL Per Tube Q4H   insulin  aspart  0-15 Units Subcutaneous Q4H   insulin  aspart  5 Units Subcutaneous 4 times per day   insulin  glargine-yfgn  10 Units Subcutaneous Daily   melatonin  10 mg Oral QHS   modafinil   100 mg Per Tube Daily   montelukast   10 mg Per Tube QHS   mouth rinse  15 mL Mouth Rinse 4 times per day   polyethylene glycol  17 g Per Tube Daily   sennosides  5 mL Per Tube QHS   Continuous Infusions:  feeding supplement (OSMOLITE 1.5 CAL) 83 mL/hr at 08/08/23 0657     LOS: 17 days   Modena Andes, MD [facility]  08/08/2023, 12:40 PM   *Please note that this is a verbal dictation therefore any spelling or grammatical errors are due to the Dragon Medical One system interpretation.  Please page via Amion and do not message via secure chat for urgent patient care matters. Secure chat can be used for non urgent patient care matters.  How to contact the [facility] Attending or Consulting provider 7A - 7P or covering provider during after hours 7P -7A, for this patient?  Check the care team in [facility] and look for a) attending/consulting [facility] provider listed and b) the [facility] team listed. Page or secure chat 7A-7P. Log into [email or web address] and use [place]'s universal password to access. If you do not have the password, please contact the hospital operator. Locate the [facility] provider you are looking for under [facility] and page to a number that you can be directly  reached. If you still have difficulty  reaching the provider, please page the [facility] (Director on Call) for the Hospitalists listed on amion for assistance.

## 2023-08-09 DIAGNOSIS — Z9889 Other specified postprocedural states: Secondary | ICD-10-CM | POA: Diagnosis not present

## 2023-08-09 DIAGNOSIS — E869 Volume depletion, unspecified: Secondary | ICD-10-CM | POA: Diagnosis not present

## 2023-08-09 DIAGNOSIS — I6200 Nontraumatic subdural hemorrhage, unspecified: Secondary | ICD-10-CM | POA: Diagnosis not present

## 2023-08-09 DIAGNOSIS — G9340 Encephalopathy, unspecified: Secondary | ICD-10-CM | POA: Diagnosis not present

## 2023-08-09 DIAGNOSIS — F0280 Dementia in other diseases classified elsewhere without behavioral disturbance: Secondary | ICD-10-CM

## 2023-08-09 DIAGNOSIS — G20A1 Parkinson's disease without dyskinesia, without mention of fluctuations: Secondary | ICD-10-CM

## 2023-08-09 LAB — BASIC METABOLIC PANEL WITH GFR
Anion gap: 8 (ref 5–15)
BUN: 60 mg/dL — ABNORMAL HIGH (ref 8–23)
CO2: 23 mmol/L (ref 22–32)
Calcium: 8.2 mg/dL — ABNORMAL LOW (ref 8.9–10.3)
Chloride: 107 mmol/L (ref 98–111)
Creatinine, Ser: 1.68 mg/dL — ABNORMAL HIGH (ref 0.61–1.24)
GFR, Estimated: 40 mL/min — ABNORMAL LOW (ref >60)
Glucose, Bld: 217 mg/dL — ABNORMAL HIGH (ref 70–99)
Potassium: 5.2 mmol/L — ABNORMAL HIGH (ref 3.5–5.1)
Sodium: 138 mmol/L (ref 135–145)

## 2023-08-09 LAB — CBC
HCT: 24.7 % — ABNORMAL LOW (ref 39.0–52.0)
Hemoglobin: 7.6 g/dL — ABNORMAL LOW (ref 13.0–17.0)
MCH: 30.5 pg (ref 26.0–34.0)
MCHC: 30.8 g/dL (ref 30.0–36.0)
MCV: 99.2 fL (ref 80.0–100.0)
Platelets: 207 10*3/uL (ref 150–400)
RBC: 2.49 MIL/uL — ABNORMAL LOW (ref 4.22–5.81)
RDW: 15.3 % (ref 11.5–15.5)
WBC: 5.7 10*3/uL (ref 4.0–10.5)
nRBC: 0 % (ref 0.0–0.2)

## 2023-08-09 LAB — VITAMIN B12: Vitamin B-12: 685 pg/mL (ref 180–914)

## 2023-08-09 LAB — RETICULOCYTES
Immature Retic Fract: 7.4 % (ref 2.3–15.9)
RBC.: 2.49 MIL/uL — ABNORMAL LOW (ref 4.22–5.81)
Retic Count, Absolute: 69.7 10*3/uL (ref 19.0–186.0)
Retic Ct Pct: 2.8 % (ref 0.4–3.1)

## 2023-08-09 LAB — IRON AND TIBC
Iron: 57 ug/dL (ref 45–182)
Saturation Ratios: 15 % — ABNORMAL LOW (ref 17.9–39.5)
TIBC: 374 ug/dL (ref 250–450)
UIBC: 317 ug/dL

## 2023-08-09 LAB — FERRITIN: Ferritin: 39 ng/mL (ref 24–336)

## 2023-08-09 LAB — GLUCOSE, CAPILLARY
Glucose-Capillary: 109 mg/dL — ABNORMAL HIGH (ref 70–99)
Glucose-Capillary: 178 mg/dL — ABNORMAL HIGH (ref 70–99)
Glucose-Capillary: 227 mg/dL — ABNORMAL HIGH (ref 70–99)
Glucose-Capillary: 97 mg/dL (ref 70–99)

## 2023-08-09 LAB — FOLATE: Folate: 17 ng/mL (ref >5.9)

## 2023-08-09 MED ORDER — SODIUM CHLORIDE 0.9 % IV SOLN: INTRAVENOUS | Status: DC

## 2023-08-09 MED ORDER — FERROUS SULFATE 325 (65 FE) MG PO TABS
325.0000 mg | ORAL_TABLET | Freq: Every day | ORAL | Status: DC
  Administered 2023-08-09 – 2023-08-14 (×6): 325 mg via ORAL
  Filled 2023-08-09 (×7): qty 1

## 2023-08-09 MED ORDER — SODIUM ZIRCONIUM CYCLOSILICATE 10 G PO PACK: 10.0000 g | PACK | Freq: Once | ORAL | Status: DC

## 2023-08-09 MED ORDER — DIPHENHYDRAMINE HCL 25 MG PO CAPS
50.0000 mg | ORAL_CAPSULE | Freq: Every day | ORAL | Status: DC
  Administered 2023-08-09 – 2023-08-13 (×5): 50 mg via ORAL
  Filled 2023-08-09 (×5): qty 2

## 2023-08-09 MED ORDER — FOLIC ACID 1 MG PO TABS
1.0000 mg | ORAL_TABLET | Freq: Every day | ORAL | Status: DC
  Administered 2023-08-09 – 2023-08-14 (×6): 1 mg via ORAL
  Filled 2023-08-09 (×6): qty 1

## 2023-08-09 MED ORDER — ARTIFICIAL TEARS OPHTHALMIC OINT
TOPICAL_OINTMENT | Freq: Four times a day (QID) | OPHTHALMIC | Status: DC
  Administered 2023-08-12: 1 via OPHTHALMIC
  Filled 2023-08-09: qty 3.5

## 2023-08-09 MED ORDER — SODIUM ZIRCONIUM CYCLOSILICATE 10 G PO PACK
10.0000 g | PACK | Freq: Two times a day (BID) | ORAL | Status: AC
  Administered 2023-08-09 (×2): 10 g via ORAL
  Filled 2023-08-09 (×2): qty 1

## 2023-08-09 NOTE — Plan of Care (Signed)
  Problem: Activity: Goal: Ability to tolerate increased activity will improve Outcome: Progressing   Problem: Respiratory: Goal: Ability to maintain a clear airway and adequate ventilation will improve Outcome: Progressing   Problem: Role Relationship: Goal: Method of communication will improve Outcome: Progressing   Problem: Education: Goal: Knowledge of General Education information will improve Description: Including pain rating scale, medication(s)/side effects and non-pharmacologic comfort measures Outcome: Progressing   Problem: Health Behavior/Discharge Planning: Goal: Ability to manage health-related needs will improve Outcome: Progressing   Problem: Clinical Measurements: Goal: Ability to maintain clinical measurements within normal limits will improve Outcome: Progressing Goal: Will remain free from infection Outcome: Progressing Goal: Diagnostic test results will improve Outcome: Progressing Goal: Respiratory complications will improve Outcome: Progressing Goal: Cardiovascular complication will be avoided Outcome: Progressing   Problem: Activity: Goal: Risk for activity intolerance will decrease Outcome: Progressing   Problem: Nutrition: Goal: Adequate nutrition will be maintained Outcome: Progressing   Problem: Coping: Goal: Level of anxiety will decrease Outcome: Progressing   Problem: Elimination: Goal: Will not experience complications related to bowel motility Outcome: Progressing Goal: Will not experience complications related to urinary retention Outcome: Progressing   Problem: Pain Managment: Goal: General experience of comfort will improve and/or be controlled Outcome: Progressing   Problem: Safety: Goal: Ability to remain free from injury will improve Outcome: Progressing   Problem: Skin Integrity: Goal: Risk for impaired skin integrity will decrease Outcome: Progressing   Problem: Education: Goal: Ability to describe self-care  measures that may prevent or decrease complications (Diabetes Survival Skills Education) will improve Outcome: Progressing Goal: Individualized Educational Video(s) Outcome: Progressing   Problem: Coping: Goal: Ability to adjust to condition or change in health will improve Outcome: Progressing   Problem: Fluid Volume: Goal: Ability to maintain a balanced intake and output will improve Outcome: Progressing   Problem: Health Behavior/Discharge Planning: Goal: Ability to identify and utilize available resources and services will improve Outcome: Progressing Goal: Ability to manage health-related needs will improve Outcome: Progressing   Problem: Metabolic: Goal: Ability to maintain appropriate glucose levels will improve Outcome: Progressing   Problem: Nutritional: Goal: Maintenance of adequate nutrition will improve Outcome: Progressing Goal: Progress toward achieving an optimal weight will improve Outcome: Progressing   Problem: Skin Integrity: Goal: Risk for impaired skin integrity will decrease Outcome: Progressing   Problem: Tissue Perfusion: Goal: Adequacy of tissue perfusion will improve Outcome: Progressing   Problem: Education: Goal: Knowledge of the prescribed therapeutic regimen will improve Outcome: Progressing   Problem: Clinical Measurements: Goal: Usual level of consciousness will be regained or maintained. Outcome: Progressing Goal: Neurologic status will improve Outcome: Progressing Goal: Ability to maintain intracranial pressure will improve Outcome: Progressing   Problem: Skin Integrity: Goal: Demonstration of wound healing without infection will improve Outcome: Progressing

## 2023-08-09 NOTE — Progress Notes (Addendum)
 NEUROLOGY CONSULT FOLLOW UP NOTE   Date of service: August 09, 2023 Patient Name: Jack White MRN:  [id number] DOB:  12/30/1940  Interval Hx/subjective  Lethargic.   Vitals   Vitals:   08/09/23 0000 08/09/23 0407 08/09/23 0500 08/09/23 1112  BP: 94/76 135/62  (!) 132/57  Pulse: 94 79  87  Resp: 18 18  18   Temp: (!) 97.4 F (36.3 C) (!) 97.3 F (36.3 C)  98.4 F (36.9 C)  TempSrc: Oral Oral  Oral  SpO2: 92% (!) 76%  96%  Weight:   101.1 kg   Height:         Body mass index is 30.23 kg/m.  Physical Exam   HEENT:  [place]/AT Lungs: Respirations unlabored Extremities: Warm and well perfused.   Neurological Examination Mental Status: Lethargic. Does not correctly answer any of 5 orientation questions. Poor attention. Poor eye contact. Poor situational awareness. Slowed responses to commands and questions. Moderately abulic. Speech is sparse, with 1-3 word answers to questions only. Occasional perseverative answers to questions.  Cranial Nerves: II: Does not reliably blink to threat.   III,IV, VI: No ptosis. EOMI. No nystagmus.  V: Reacts to touch bilaterally VII: Smile symmetric VIII: Hearing intact to some questions.  IX,X: Mildly hypophonic XI: Symmetric XII: Midline tongue extension Motor: Moves all 4 extremities to noxious. Does not follow most commands.  BUE 4+/5 on the right, 3-4/5 on the left BLE withdraw to noxious plantar stimulation after a delay, weaker on the left. No antigravity movement at hips.  Sensory: Reacts to pinch stimuli x 4 Deep Tendon Reflexes: 1+ and symmetric patellae Cerebellar: Not following commands for assessment Gait: Deferred   Medications  Current Facility-Administered Medications:    0.9 %  sodium chloride  infusion, , Intravenous, Continuous, Regalado, Belkys A, MD, Last Rate: 40 mL/hr at 08/09/23 1631, New Bag at 08/09/23 1631   acetaminophen  (TYLENOL ) tablet 650 mg, 650 mg, Per Tube, Q6H, Augusto Blonder, MD, 650 mg at  08/09/23 1408   albuterol  (PROVENTIL ) (2.5 MG/3ML) 0.083% nebulizer solution 2.5 mg, 2.5 mg, Nebulization, Q3H PRN, Hadley Leu, NP, 2.5 mg at 08/05/23 1311   alum & mag hydroxide-simeth (MAALOX/MYLANTA) 200-200-20 MG/5ML suspension 30 mL, 30 mL, Per Tube, Q6H PRN, [place], Sayre Mazor J, DO, 30 mL at 08/06/23 1843   artificial tears (LACRILUBE) ophthalmic ointment, , Both Eyes, Q6H, Regalado, Belkys A, MD, Given at 08/09/23 1300   atorvastatin  (LIPITOR) tablet 10 mg, 10 mg, Per Tube, QHS, Nundkumar, Neelesh, MD, 10 mg at 08/07/23 2305   bacitracin  ointment, , Topical, Daily, Nundkumar, Neelesh, MD, 31.5 Application at 08/09/23 1136   carbamazepine  (TEGRETOL ) tablet 200 mg, 200 mg, Per Tube, TID, Harris, Whitney D, NP, 200 mg at 08/09/23 1640   carbidopa -levodopa  (SINEMET  IR) 25-100 MG per tablet immediate release 1 tablet, 1 tablet, Per Tube, TID, Chavez, Abigail, NP, 1 tablet at 08/09/23 1632   Chlorhexidine  Gluconate Cloth 2 % PADS 6 each, 6 each, Topical, Daily, Nundkumar, Neelesh, MD, 6 each at 08/09/23 1132   diphenhydrAMINE  (BENADRYL ) capsule 50 mg, 50 mg, Oral, QHS, Regalado, Belkys A, MD   docusate (COLACE) 50 MG/5ML liquid 100 mg, 100 mg, Per Tube, BID, Chavez, Abigail, NP, 100 mg at 08/08/23 1029   doxazosin  (CARDURA ) tablet 4 mg, 4 mg, Per Tube, Daily, Nundkumar, Neelesh, MD, 4 mg at 08/09/23 1008   enoxaparin  (LOVENOX ) injection 40 mg, 40 mg, Subcutaneous, Q24H, Easter Golden Caylin, PA-C, 40 mg at 08/09/23 1447   famotidine  (PEPCID ) tablet  20 mg, 20 mg, Per Tube, Daily, Harris, Whitney D, NP, 20 mg at 08/09/23 1031   feeding supplement (ENSURE PLUS HIGH PROTEIN) liquid 237 mL, 237 mL, Oral, BID BM, [place], Brenin Heidelberger J, DO, 237 mL at 08/09/23 1440   ferrous sulfate tablet 325 mg, 325 mg, Oral, Q breakfast, Regalado, Belkys A, MD, 325 mg at 08/09/23 1631   folic acid (FOLVITE) tablet 1 mg, 1 mg, Oral, Daily, Regalado, Belkys A, MD, 1 mg at 08/09/23 1631   melatonin tablet 10 mg, 10 mg, Oral,  QHS, [place], Lajoyce Tamura J, DO, 10 mg at 08/07/23 2304   modafinil  (PROVIGIL ) tablet 100 mg, 100 mg, Per Tube, Daily, [place], Yousra Ivens J, DO, 100 mg at 08/09/23 0800   montelukast  (SINGULAIR ) tablet 10 mg, 10 mg, Per Tube, QHS, Chavez, Abigail, NP, 10 mg at 08/07/23 2305   ondansetron  (ZOFRAN ) tablet 4 mg, 4 mg, Per Tube, Q4H PRN, 4 mg at 08/06/23 0834 **OR** ondansetron  (ZOFRAN ) injection 4 mg, 4 mg, Intravenous, Q4H PRN, Chavez, Abigail, NP, 4 mg at 08/09/23 1610   Oral care mouth rinse, 15 mL, Mouth Rinse, 4 times per day, Audie Bleacher, MD, 15 mL at 08/09/23 1549   Oral care mouth rinse, 15 mL, Mouth Rinse, PRN, Cabbell, Kyle, MD   polyethylene glycol (MIRALAX  / GLYCOLAX ) packet 17 g, 17 g, Per Tube, Daily, Bailey Lesser, NP, 17 g at 08/08/23 1030   sennosides (SENOKOT) 8.8 MG/5ML syrup 5 mL, 5 mL, Per Tube, QHS, Augusto Blonder, MD, 5 mL at 08/05/23 2115   sodium zirconium cyclosilicate  (LOKELMA ) packet 10 g, 10 g, Oral, BID, Regalado, Belkys A, MD, 10 g at 08/09/23 1400   traMADol  (ULTRAM ) tablet 50 mg, 50 mg, Per Tube, Q8H PRN, [place], Rema Care, DO, 50 mg at 08/09/23 1547  Labs and Diagnostic Imaging   CBC:  Recent Labs  Lab 08/04/23 0541 08/05/23 0523 08/08/23 1100 08/09/23 1124  WBC 6.0   < > 6.2 5.7  NEUTROABS 3.7  --  4.0  --   HGB 8.1*   < > 8.0* 7.6*  HCT 26.3*   < > 25.9* 24.7*  MCV 101.9*   < > 100.4* 99.2  PLT 220   < > 220 207   < > = values in this interval not displayed.    Basic Metabolic Panel:  Lab Results  Component Value Date   NA 138 08/09/2023   K 5.2 (H) 08/09/2023   CO2 23 08/09/2023   GLUCOSE 217 (H) 08/09/2023   BUN 60 (H) 08/09/2023   CREATININE 1.68 (H) 08/09/2023   CALCIUM  8.2 (L) 08/09/2023   GFRNONAA 40 (L) 08/09/2023   GFRAA 50 (L) 11/01/2014   Lipid Panel:  Lab Results  Component Value Date   LDLCALC 66 05/18/2014   HgbA1c:  Lab Results  Component Value Date   HGBA1C 6.5 (H) 07/22/2023   Urine Drug Screen: No results found for:  LABOPIA, COCAINSCRNUR, LABBENZ, AMPHETMU, THCU, LABBARB  Alcohol Level     Component Value Date/Time   [facility] <15 07/21/2023 2311   INR  Lab Results  Component Value Date   INR 1.2 07/21/2023   APTT  Lab Results  Component Value Date   APTT 31 07/21/2023     Assessment  83 y.o. male with a PMHx of HTN, CKD3, DM, [name]'s neuralgia, multiple gastric ulcers, Parkinson's disease (seen by Dr. Winferd Hatter at [facility] on 07/16/23 as well as at [facility]), Trigeminal neuralgia (on fairly high-dose carbamazepine ), Diabetic peripheral neuropathy and myoclonus (  thought to be due to Neurontin  in the setting of renal disease), who presented from home on 5/26 with AMS and left hemiplegia after a fall. Neurology has been consulted at family's request to assess for possible need to decrease his Sinemet  dosing.  - Exam reveals a lethargic elderly male who does not correctly answer any of 5 orientation questions. Poor attention. Poor eye contact. Poor situational awareness. Slowed responses to commands and questions. Moderately abulic. Speech is sparse, with 1-3 word answers to questions only. Occasional perseverative answers to questions. Moves all 4 extremities to noxious. Does not follow most commands.  BUE 4+/5 on the right, 3-4/5 on the left. BLE withdraw to noxious plantar stimulation after a delay, weaker on the left. No antigravity movement at hips.  - Postoperative CT head (07/26/23): Motion degraded exam. Postoperative changes from prior right frontal craniotomy for subdural evacuation. Residual subdural hematoma measures up to 1.5 cm in thickness with associated mass effect on the subjacent right cerebral hemisphere and 5-6 mm of right-to-left shift. Smaller left-sided subdural hematoma without significant mass effect. Overall, changes are not significantly changed from prior. No other new acute intracranial abnormality. - Impression: Encephalopathy in the setting of multiple comorbidieis.  Subdural hematoma s/p evacuation is the most likely initial precipitant for his AMS. Possible enlargement of the subdural since last CT scan on 5/31 is a consideration for his current persistent AMS. His elevated BUN:Cr ratio is suggestive of volume depletion, which could be contributing. Intercurrent infection and hospital delirium are also on the DDx. 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   Recommendations  - No indication for reducing his Sinemet  dose. No dyskinesias or psychosis noted.  - Repeat CT head (ordered) - Volume repletion - PT/OT/Speech - Avoid deliriogenic medications - Maintain normal sleep-wake cycle  ______________________________________________________________________   Hope Ly, Shelagh Rayman, MD [facility]

## 2023-08-09 NOTE — Progress Notes (Signed)
 Patient ate 100% of breakfast, 40 % of lunch to include an Ensure and he just finished the day eating 90 % of his dinner. He also drunk some of his evening Ensure.

## 2023-08-09 NOTE — Progress Notes (Addendum)
 PROGRESS NOTE    Jack White  ZOX:[id number] DOB: 03-02-1940 DOA: 07/21/2023 PCP: Nestor Banter, MD   Brief Narrative: 83 year old gentleman with past medical history significant for hypertension, hyperlipidemia, diabetes, CKD stage IIIa, possible Parkinson disease, trigeminal neuralgia, BPH who presents to7/2025 from home via EMS after mechanical fall with associated headache, left-sided weakness, nausea vomiting.  Code stroke was initiated.  No anticoagulation or antiplatelets on outpatient.  Workup in the ED was notable for a large right subdural hematoma with 1.6 mm right-to-left midline shift.  Neurosurgery was consulted and taken for emergent right craniotomy, admitted to the neuro ICU under the neurosurgical service with PCCM consulted.  Significant hospital events: 5/27: Admit by neurosurgery for SDH s/p craniotomy, PCCM consulted; extubated. 5/28: Intermittent agitation/confusion, difficulty sleeping 5/29: Restlessness overnight, sleeping poorly on a.m. assessment 5/30: Repeat CT head stable, transferred out of ICU; Eval by CIR>does not meet level of care 6/1: CCM reconsulted for hypoxia, CT angiogram chest negative for PE, small bilateral pleural effusions, multifocal groundglass opacities bilateral upper lobes, right middle lobe concerning for infectious process. 6/2: re-eval by CIR> continues to not meet level of care and signed off 6/3: Encephalopathy improved, following commands for RN (thumbs up, wiggling toes, stating name)Fatigues quickly, but more awake than prior  6/4: Hemoglobin down to 6.9, 1 unit PRBC ordered 6/5: Creatinine rising, sodium improved with free water , IVC on POCUS collapsible with marked respiratory variation, IV fluids given; concern for possible aspiration event following swallow evaluation. 6/6: Creatinine improved, stopped IV fluids; mental status waxing/waning; started on Provigil  6/9: Transferred to [facility]; pain overnight, given dose of morphine  by  PCCM coverage; awaiting MBS by SLP 6/10: MBS completed, started on clear liquid diet. 6/11: Advanced to dysphagia 2 diet.  Staples removed by neurosurgery.  Transitioned to nocturnal feeds only. 6/12.  Transferred to med telemetry floor.    Assessment & Plan:   Principal Problem:   Status post surgery Active Problems:   Subdural hematoma (HCC)   Malnutrition of moderate degree  Traumatic subdural hematoma with midline shift s/p craniotomy; - Patient presents after mechanical fall with associated left-sided weakness, nausea vomiting and confusion. - CT head showed large right subdural hematoma with 1.6 mm right-to-left midline shift. - Patient was taken emergently for craniotomy by neurosurgery.  - Repeat CT head 5/28 with decreased right subdural hematoma mass effect and left midline shift.  Right subdural drain in place. -CT head 5/31: residual subdural hematoma 1.5 cm in thickness with mass effect right cerebral hemisphere 5 to 6 mm right-to-left shift, smaller left-sided SDH without mass effect, not significant change from prior.  -Staples removed by neurosurgery 08/07/2023 - Patient will need skilled nursing facility for rehab -he is sleepy today, was agitated last night remove core track. Didn't sleep well.   Hyperkalemia - Received insulin  and dextrose .  Status post Lokelma  x 2 doses. -Repeat  potassium level this morning -in setting AKI.  Plan to give Lokelma  times two today.  K down to 5.2  Acute renal failure on CKD stage IIIA;  Patient baseline creatinine 1.3--- 1.5 - Creatinine peak 2.7 Renal function improved Continue to monitor closely  Will start Low rate IV fluids today. He is sleepy, and might not drink much. Also to help with hyperkalemia.   Hypoactive delirium: -On Provigil  by PCCM - Delirium  precautions  Aspiration pneumonia: -6/01: Patient became hypoxic.  -CTA chest: negative for PE but with small bilateral pleural effusions and multifocal groundglass  opacities bilateral upper lobes, right middle  lobe concerning for infectious process, likely aspiration.  -Completed 5 days of Unasyn  on 6/06  Moderate Malnutrition;  He removed Tube feeding.  Discussed with family, plan to see how he does over weekend. He ate 100% breakfast, he needs to be fed.   Anemia Received 1 unit of packed red blood cells 6//2025 Monitor.  Anemia panel. T sat low, will start Iron supplement. Will add folic acid.  Monitor Hb.   Hypertension - Continue metoprolol   Hyperlipidemia -on lipitor.   DM2 Hemoglobin A1c 6.5%. -- Hold Semglee  10 units [place] daily (today, to see how he does. He CBG yesterday at 30.  -Will hold insulin  regimen today. Now if CBG start to increase more will add low dose SSI  Parkinson's disease -- Sinemet  1 tablet 3 times daily  Daughter report, that Dr Tat, was questioning diagnosis for Parkinson. She was asking if his medications can be reduce to help prevent sleepiness.  -Neurology consulted. Dat Scan are not done inpatient.   Trigeminal neuralgia Follow-up with neurology outpatient, Dr. Winferd Hatter; last seen 07/16/2023. -- Tegretol  level 5.7 on 07/30/2023 -- Continue Tegretol  200 mg per tube TiD   BPH -- Doxazosin  4 mg per tube daily   Insomnia Reports patient takes 10 mg of melatonin and 50 mg of Benadryl  nightly at home. -- Increased  6/09 :  melatonin to 10 mg nightly.    having issues at night with sleep. Daughter report he takes 50 mg benadryl  at home, and have been doing it for years.   Left heel ulcer, POA Sacral/coccyx skin tear Seen by podiatry at [facility], underwent debridement on 5/22. -- Cleanse L heel wound with NS, apply Bacitracin  ointment to wound bed daily, cover with Telfa nonstick dressing and secure with silicone foam or Kerlix roll gauze whichever is preferred.  -- Apply sacral foam dressing, change every 3 days or PRN    Weakness/debility/deconditioning: -- Continue PT/OT efforts -- Will need SNF  placement   Nutrition Problem: Moderate Malnutrition Etiology: chronic illness    Signs/Symptoms: moderate fat depletion, moderate muscle depletion    Interventions: Tube feeding, Prostat, MVI  Estimated body mass index is 30.23 kg/m as calculated from the following:   Height as of this encounter: 6' (1.829 m).   Weight as of this encounter: 101.1 kg.   DVT prophylaxis: Lovenox  Code Status: DNR-intervention  Family Communication:Daughter at bedside Disposition Plan:  Status is: Inpatient Remains inpatient appropriate because: management of SDH    Consultants:  Neurosurgery CCM   Procedures:  Craniotomy   Antimicrobials:    Subjective: He is sleepy, would open eyes to voice.  He was able to say his grandson Name, with some difficulty.  Daughter think he is doing ok today. Yesterday she thought he was less coherent.  She think low blood sugar made him remove the core track.  She is worry about conjunctivitis. His eyes on my exam are not significantly red. Will monitor for purulent discharge.  She wants to make sure we do CBG Q 4 hours. I think is reasonable for  today because of hypoglycemia.  Spoke with staff about turning patient every 2 hours.  Discussed with daughter plan to try Zofran  PRN before meals to see if that helps with nausea.    Objective: Vitals:   08/08/23 1940 08/09/23 0000 08/09/23 0407 08/09/23 0500  BP: (!) 84/34 94/76 135/62   Pulse: 74 94 79   Resp: 18 18 18    Temp: (!) 97.4 F (36.3 C) (!) 97.4 F (  36.3 C) (!) 97.3 F (36.3 C)   TempSrc: Oral Oral Oral   SpO2: 97% 92% (!) 76%   Weight:    101.1 kg  Height:        Intake/Output Summary (Last 24 hours) at 08/09/2023 0756 Last data filed at 08/09/2023 0745 Gross per 24 hour  Intake 952 ml  Output 400 ml  Net 552 ml   Filed Weights   08/07/23 0600 08/08/23 0500 08/09/23 0500  Weight: 106.6 kg 106.7 kg 101.1 kg    Examination:  General exam: Appears calm and comfortable   Respiratory system: Clear to auscultation. Respiratory effort normal. Cardiovascular system: S1 & S2 heard, RRR.  Gastrointestinal system: Abdomen is nondistended, soft and nontender. No organomegaly or masses felt. Normal bowel sounds heard. Central nervous system: sleepy  Extremities: no edema  Data Reviewed: I have personally reviewed following labs and imaging studies  CBC: Recent Labs  Lab 08/03/23 0454 08/04/23 0541 08/05/23 0523 08/06/23 0504 08/08/23 1100  WBC 5.8 6.0 5.9 6.4 6.2  NEUTROABS 3.7 3.7  --   --  4.0  HGB 8.1* 8.1* 7.9* 7.7* 8.0*  HCT 26.2* 26.3* 26.3* 25.0* 25.9*  MCV 99.2 101.9* 101.2* 98.8 100.4*  PLT 197 220 210 212 220   Basic Metabolic Panel: Recent Labs  Lab 08/03/23 0454 08/04/23 0541 08/05/23 0523 08/06/23 0504 08/06/23 1544 08/08/23 1100 08/08/23 1822  NA 142 143 140 139  --  138  --   K 5.2* 5.7* 5.4* 5.6* 5.4* 6.0* 5.3*  CL 111 116* 108 107  --  106  --   CO2 26 20* 24 23  --  25  --   GLUCOSE 124* 126* 137* 114*  --  119*  --   BUN 77* 82* 77* 76*  --  68*  --   CREATININE 1.73* 1.94* 1.79* 1.66*  --  1.62*  --   CALCIUM  8.1* 8.3* 8.0* 8.1*  --  8.3*  --   MG  --  3.1* 3.1* 3.0*  --  2.9*  --   PHOS  --  4.7* 5.3* 5.5*  --   --   --    GFR: Estimated Creatinine Clearance: 41.8 mL/min (A) (by C-G formula based on SCr of 1.62 mg/dL (H)). Liver Function Tests: Recent Labs  Lab 08/05/23 0523  AST 17  ALT 6  ALKPHOS 105  BILITOT 0.5  PROT 5.9*  ALBUMIN  2.4*   No results for input(s): LIPASE, AMYLASE in the last 168 hours. No results for input(s): AMMONIA in the last 168 hours. Coagulation Profile: No results for input(s): INR, PROTIME in the last 168 hours. Cardiac Enzymes: No results for input(s): CKTOTAL, CKMB, CKMBINDEX, TROPONINI in the last 168 hours. BNP (last 3 results) No results for input(s): PROBNP in the last 8760 hours. HbA1C: No results for input(s): HGBA1C in the last 72  hours. CBG: Recent Labs  Lab 08/08/23 1616 08/08/23 2006 08/08/23 2027 08/09/23 0009 08/09/23 0349  GLUCAP 136* 30* 74 97 109*   Lipid Profile: No results for input(s): CHOL, HDL, LDLCALC, TRIG, CHOLHDL, LDLDIRECT in the last 72 hours. Thyroid  Function Tests: No results for input(s): TSH, T4TOTAL, FREET4, T3FREE, THYROIDAB in the last 72 hours. Anemia Panel: No results for input(s): VITAMINB12, FOLATE, FERRITIN, TIBC, IRON, RETICCTPCT in the last 72 hours. Sepsis Labs: No results for input(s): PROCALCITON, LATICACIDVEN in the last 168 hours.  No results found for this or any previous visit (from the past 240 hours).  Radiology Studies: No results found.      Scheduled Meds:  acetaminophen   650 mg Per Tube Q6H   atorvastatin   10 mg Per Tube QHS   bacitracin    Topical Daily   carbamazepine   200 mg Per Tube TID   carbidopa -levodopa   1 tablet Per Tube TID   Chlorhexidine  Gluconate Cloth  6 each Topical Daily   diphenhydrAMINE   25 mg Oral QHS   docusate  100 mg Per Tube BID   doxazosin   4 mg Per Tube Daily   enoxaparin  (LOVENOX ) injection  40 mg Subcutaneous Q24H   famotidine   20 mg Per Tube Daily   feeding supplement  237 mL Oral BID BM   feeding supplement (PROSource TF20)  60 mL Per Tube Daily   free water   100 mL Per Tube Q4H   insulin  aspart  0-15 Units Subcutaneous Q4H   insulin  aspart  5 Units Subcutaneous 4 times per day   insulin  glargine-yfgn  10 Units Subcutaneous Daily   melatonin  10 mg Oral QHS   modafinil   100 mg Per Tube Daily   montelukast   10 mg Per Tube QHS   mouth rinse  15 mL Mouth Rinse 4 times per day   polyethylene glycol  17 g Per Tube Daily   sennosides  5 mL Per Tube QHS   sodium zirconium cyclosilicate   10 g Oral BID   Continuous Infusions:  feeding supplement (OSMOLITE 1.5 CAL) Stopped (08/08/23 2255)     LOS: 18 days    Time spent: 35 minutes.    Danette Duos, MD [facility]   If 7PM-7AM, please contact night-coverage [email or web address]  08/09/2023, 7:56 AM

## 2023-08-10 ENCOUNTER — Inpatient Hospital Stay (HOSPITAL_COMMUNITY)

## 2023-08-10 DIAGNOSIS — I6523 Occlusion and stenosis of bilateral carotid arteries: Secondary | ICD-10-CM | POA: Diagnosis not present

## 2023-08-10 DIAGNOSIS — Z9889 Other specified postprocedural states: Secondary | ICD-10-CM | POA: Diagnosis not present

## 2023-08-10 LAB — BASIC METABOLIC PANEL WITH GFR
Anion gap: 9 (ref 5–15)
BUN: 54 mg/dL — ABNORMAL HIGH (ref 8–23)
CO2: 24 mmol/L (ref 22–32)
Calcium: 8.3 mg/dL — ABNORMAL LOW (ref 8.9–10.3)
Chloride: 107 mmol/L (ref 98–111)
Creatinine, Ser: 1.66 mg/dL — ABNORMAL HIGH (ref 0.61–1.24)
GFR, Estimated: 40 mL/min — ABNORMAL LOW (ref >60)
Glucose, Bld: 128 mg/dL — ABNORMAL HIGH (ref 70–99)
Potassium: 4.7 mmol/L (ref 3.5–5.1)
Sodium: 140 mmol/L (ref 135–145)

## 2023-08-10 LAB — GLUCOSE, CAPILLARY
Glucose-Capillary: 110 mg/dL — ABNORMAL HIGH (ref 70–99)
Glucose-Capillary: 134 mg/dL — ABNORMAL HIGH (ref 70–99)
Glucose-Capillary: 144 mg/dL — ABNORMAL HIGH (ref 70–99)
Glucose-Capillary: 145 mg/dL — ABNORMAL HIGH (ref 70–99)
Glucose-Capillary: 178 mg/dL — ABNORMAL HIGH (ref 70–99)
Glucose-Capillary: 198 mg/dL — ABNORMAL HIGH (ref 70–99)

## 2023-08-10 LAB — CBC
HCT: 25.4 % — ABNORMAL LOW (ref 39.0–52.0)
Hemoglobin: 7.8 g/dL — ABNORMAL LOW (ref 13.0–17.0)
MCH: 31.1 pg (ref 26.0–34.0)
MCHC: 30.7 g/dL (ref 30.0–36.0)
MCV: 101.2 fL — ABNORMAL HIGH (ref 80.0–100.0)
Platelets: 198 10*3/uL (ref 150–400)
RBC: 2.51 MIL/uL — ABNORMAL LOW (ref 4.22–5.81)
RDW: 15.6 % — ABNORMAL HIGH (ref 11.5–15.5)
WBC: 4.4 10*3/uL (ref 4.0–10.5)
nRBC: 0 % (ref 0.0–0.2)

## 2023-08-10 MED ORDER — ATORVASTATIN CALCIUM 10 MG PO TABS
10.0000 mg | ORAL_TABLET | Freq: Every day | ORAL | Status: DC
  Administered 2023-08-10 – 2023-08-13 (×4): 10 mg via ORAL
  Filled 2023-08-10 (×4): qty 1

## 2023-08-10 MED ORDER — ONDANSETRON HCL 4 MG PO TABS
4.0000 mg | ORAL_TABLET | ORAL | Status: DC | PRN
  Administered 2023-08-10: 4 mg via ORAL
  Filled 2023-08-10 (×2): qty 1

## 2023-08-10 MED ORDER — MODAFINIL 100 MG PO TABS
100.0000 mg | ORAL_TABLET | Freq: Every day | ORAL | Status: DC
  Administered 2023-08-11 – 2023-08-14 (×4): 100 mg via ORAL
  Filled 2023-08-10 (×4): qty 1

## 2023-08-10 MED ORDER — ONDANSETRON HCL 4 MG/2ML IJ SOLN: 4.0000 mg | INTRAMUSCULAR | Status: DC | PRN

## 2023-08-10 MED ORDER — SENNOSIDES 8.8 MG/5ML PO SYRP
5.0000 mL | ORAL_SOLUTION | Freq: Every day | ORAL | Status: DC
  Administered 2023-08-10 – 2023-08-13 (×4): 5 mL via ORAL
  Filled 2023-08-10 (×4): qty 5

## 2023-08-10 MED ORDER — FAMOTIDINE 20 MG PO TABS
20.0000 mg | ORAL_TABLET | Freq: Every day | ORAL | Status: DC
  Administered 2023-08-11 – 2023-08-14 (×4): 20 mg via ORAL
  Filled 2023-08-10 (×4): qty 1

## 2023-08-10 MED ORDER — TRAMADOL HCL 50 MG PO TABS
50.0000 mg | ORAL_TABLET | Freq: Three times a day (TID) | ORAL | Status: DC | PRN
  Administered 2023-08-13: 50 mg via ORAL
  Filled 2023-08-10 (×4): qty 1

## 2023-08-10 MED ORDER — BOOST / RESOURCE BREEZE PO LIQD CUSTOM
1.0000 | Freq: Three times a day (TID) | ORAL | Status: DC
  Administered 2023-08-10 – 2023-08-12 (×8): 1 via ORAL

## 2023-08-10 MED ORDER — METOPROLOL TARTRATE 12.5 MG HALF TABLET
12.5000 mg | ORAL_TABLET | Freq: Two times a day (BID) | ORAL | Status: DC
  Administered 2023-08-10 – 2023-08-14 (×6): 12.5 mg via ORAL
  Filled 2023-08-10 (×9): qty 1

## 2023-08-10 MED ORDER — DOXAZOSIN MESYLATE 4 MG PO TABS
4.0000 mg | ORAL_TABLET | Freq: Every day | ORAL | Status: DC
  Administered 2023-08-11 – 2023-08-14 (×4): 4 mg via ORAL
  Filled 2023-08-10 (×4): qty 1

## 2023-08-10 MED ORDER — CARBAMAZEPINE 200 MG PO TABS
200.0000 mg | ORAL_TABLET | Freq: Three times a day (TID) | ORAL | Status: DC
  Administered 2023-08-10 – 2023-08-14 (×12): 200 mg via ORAL
  Filled 2023-08-10 (×14): qty 1

## 2023-08-10 MED ORDER — DOCUSATE SODIUM 50 MG/5ML PO LIQD
100.0000 mg | Freq: Two times a day (BID) | ORAL | Status: DC
  Administered 2023-08-10 – 2023-08-14 (×8): 100 mg via ORAL
  Filled 2023-08-10 (×8): qty 10

## 2023-08-10 MED ORDER — ACETAMINOPHEN 325 MG PO TABS
650.0000 mg | ORAL_TABLET | Freq: Four times a day (QID) | ORAL | Status: DC
  Administered 2023-08-10 – 2023-08-14 (×13): 650 mg via ORAL
  Filled 2023-08-10 (×15): qty 2

## 2023-08-10 MED ORDER — CARBIDOPA-LEVODOPA 25-100 MG PO TABS
1.0000 | ORAL_TABLET | Freq: Three times a day (TID) | ORAL | Status: DC
  Administered 2023-08-10 – 2023-08-14 (×13): 1 via ORAL
  Filled 2023-08-10 (×13): qty 1

## 2023-08-10 MED ORDER — POLYETHYLENE GLYCOL 3350 17 G PO PACK
17.0000 g | PACK | Freq: Every day | ORAL | Status: DC
  Administered 2023-08-11 – 2023-08-14 (×4): 17 g via ORAL
  Filled 2023-08-10 (×4): qty 1

## 2023-08-10 MED ORDER — ALUM & MAG HYDROXIDE-SIMETH 200-200-20 MG/5ML PO SUSP
30.0000 mL | Freq: Four times a day (QID) | ORAL | Status: DC | PRN
  Administered 2023-08-11: 30 mL via ORAL
  Filled 2023-08-10: qty 30

## 2023-08-10 NOTE — Plan of Care (Signed)
  Problem: Activity: Goal: Ability to tolerate increased activity will improve Outcome: Progressing   Problem: Respiratory: Goal: Ability to maintain a clear airway and adequate ventilation will improve Outcome: Progressing   Problem: Role Relationship: Goal: Method of communication will improve Outcome: Progressing   Problem: Education: Goal: Knowledge of General Education information will improve Description: Including pain rating scale, medication(s)/side effects and non-pharmacologic comfort measures Outcome: Progressing   Problem: Health Behavior/Discharge Planning: Goal: Ability to manage health-related needs will improve Outcome: Progressing   Problem: Clinical Measurements: Goal: Ability to maintain clinical measurements within normal limits will improve Outcome: Progressing Goal: Will remain free from infection Outcome: Progressing Goal: Diagnostic test results will improve Outcome: Progressing Goal: Respiratory complications will improve Outcome: Progressing Goal: Cardiovascular complication will be avoided Outcome: Progressing   Problem: Activity: Goal: Risk for activity intolerance will decrease Outcome: Progressing   Problem: Nutrition: Goal: Adequate nutrition will be maintained Outcome: Progressing   Problem: Coping: Goal: Level of anxiety will decrease Outcome: Progressing   Problem: Elimination: Goal: Will not experience complications related to bowel motility Outcome: Progressing Goal: Will not experience complications related to urinary retention Outcome: Progressing   Problem: Pain Managment: Goal: General experience of comfort will improve and/or be controlled Outcome: Progressing   Problem: Safety: Goal: Ability to remain free from injury will improve Outcome: Progressing   Problem: Skin Integrity: Goal: Risk for impaired skin integrity will decrease Outcome: Progressing   Problem: Education: Goal: Ability to describe self-care  measures that may prevent or decrease complications (Diabetes Survival Skills Education) will improve Outcome: Progressing Goal: Individualized Educational Video(s) Outcome: Progressing   Problem: Coping: Goal: Ability to adjust to condition or change in health will improve Outcome: Progressing   Problem: Fluid Volume: Goal: Ability to maintain a balanced intake and output will improve Outcome: Progressing   Problem: Health Behavior/Discharge Planning: Goal: Ability to identify and utilize available resources and services will improve Outcome: Progressing Goal: Ability to manage health-related needs will improve Outcome: Progressing   Problem: Metabolic: Goal: Ability to maintain appropriate glucose levels will improve Outcome: Progressing   Problem: Nutritional: Goal: Maintenance of adequate nutrition will improve Outcome: Progressing Goal: Progress toward achieving an optimal weight will improve Outcome: Progressing   Problem: Skin Integrity: Goal: Risk for impaired skin integrity will decrease Outcome: Progressing   Problem: Tissue Perfusion: Goal: Adequacy of tissue perfusion will improve Outcome: Progressing   Problem: Education: Goal: Knowledge of the prescribed therapeutic regimen will improve Outcome: Progressing   Problem: Clinical Measurements: Goal: Usual level of consciousness will be regained or maintained. Outcome: Progressing Goal: Neurologic status will improve Outcome: Progressing Goal: Ability to maintain intracranial pressure will improve Outcome: Progressing   Problem: Skin Integrity: Goal: Demonstration of wound healing without infection will improve Outcome: Progressing

## 2023-08-10 NOTE — Progress Notes (Addendum)
 PROGRESS NOTE    Jack White  ZOX:[id number] DOB: 23-Sep-1940 DOA: 07/21/2023 PCP: Nestor Banter, MD   Brief Narrative: 83 year old gentleman with past medical history significant for hypertension, hyperlipidemia, diabetes, CKD stage IIIa, possible Parkinson disease, trigeminal neuralgia, BPH who presents to7/2025 from home via EMS after mechanical fall with associated headache, left-sided weakness, nausea vomiting.  Code stroke was initiated.  No anticoagulation or antiplatelets on outpatient.  Workup in the ED was notable for a large right subdural hematoma with 1.6 mm right-to-left midline shift.  Neurosurgery was consulted and taken for emergent right craniotomy, admitted to the neuro ICU under the neurosurgical service with PCCM consulted.  Significant hospital events: 5/27: Admit by neurosurgery for SDH s/p craniotomy, PCCM consulted; extubated. 5/28: Intermittent agitation/confusion, difficulty sleeping 5/29: Restlessness overnight, sleeping poorly on a.m. assessment 5/30: Repeat CT head stable, transferred out of ICU; Eval by CIR>does not meet level of care 6/1: CCM reconsulted for hypoxia, CT angiogram chest negative for PE, small bilateral pleural effusions, multifocal groundglass opacities bilateral upper lobes, right middle lobe concerning for infectious process. 6/2: re-eval by CIR> continues to not meet level of care and signed off 6/3: Encephalopathy improved, following commands for RN (thumbs up, wiggling toes, stating name)Fatigues quickly, but more awake than prior  6/4: Hemoglobin down to 6.9, 1 unit PRBC ordered 6/5: Creatinine rising, sodium improved with free water , IVC on POCUS collapsible with marked respiratory variation, IV fluids given; concern for possible aspiration event following swallow evaluation. 6/6: Creatinine improved, stopped IV fluids; mental status waxing/waning; started on Provigil  6/9: Transferred to [facility]; pain overnight, given dose of morphine  by  PCCM coverage; awaiting MBS by SLP 6/10: MBS completed, started on clear liquid diet. 6/11: Advanced to dysphagia 2 diet.  Staples removed by neurosurgery.  Transitioned to nocturnal feeds only. 6/12.  Transferred to med telemetry floor.    Assessment & Plan:   Principal Problem:   Status post surgery Active Problems:   Subdural hematoma (HCC)   Malnutrition of moderate degree  Traumatic subdural hematoma with midline shift s/p craniotomy; - Patient presents after mechanical fall with associated left-sided weakness, nausea vomiting and confusion. - CT head showed large right subdural hematoma with 1.6 mm right-to-left midline shift. - Patient was taken emergently for craniotomy by neurosurgery.  - Repeat CT head 5/28 with decreased right subdural hematoma mass effect and left midline shift.  Right subdural drain in place. -CT head 5/31: residual subdural hematoma 1.5 cm in thickness with mass effect right cerebral hemisphere 5 to 6 mm right-to-left shift, smaller left-sided SDH without mass effect, not significant change from prior.  -Staples removed by neurosurgery 08/07/2023 - Patient will need skilled nursing facility for rehab -he is more alert today, he was able to tell me his name, answer simples questions.  Neurology order CT head, patient was very lethargic yesterday. Will fu.   Hyperkalemia - Received insulin  and dextrose . Status post Lokelma  . -In setting AKI.  -K down to 4.7  Acute renal failure on CKD stage IIIA;  Patient baseline creatinine 1.3--- 1.5 - Creatinine peak 2.7 Renal function improved Continue to monitor closely  Low rate IV fluids. Renal function improving.   Hypoactive delirium: -On Provigil  by PCCM - Delirium  precautions  Aspiration pneumonia: -6/01: Patient became hypoxic.  -CTA chest: negative for PE but with small bilateral pleural effusions and multifocal groundglass opacities bilateral upper lobes, right middle lobe concerning for infectious  process, likely aspiration.  -Completed 5 days of Unasyn  on 6/06  Moderate Malnutrition;  He removed Tube feeding.  Discussed with family, plan to see how he does over weekend. He ate 100% breakfast, he needs to be fed.   Anemia Received 1 unit of packed red blood cells 6//2025 Monitor.  Anemia panel. T sat low, started  Iron supplement and  folic acid.  Monitor Hb. 7.6--7.8  Hypertension - BP increasing. Resume low dose  metoprolol   Hyperlipidemia -on lipitor.   DM2 Hemoglobin A1c 6.5%. -- Hold Semglee  10 units [place] daily (today, to see how he does. He CBG yesterday at 30.  -holding insulin  regimen to avoid hypoglycemia. Monitor oral intake.   Parkinson's disease -- Sinemet  1 tablet 3 times daily  Daughter report, that Dr Tat, was questioning diagnosis for Parkinson. She was asking if his medications can be reduce to help prevent sleepiness.  -Neurology consulted. Dat Scan are not done inpatient.  -neurology recommend to continue current dose of sinemet .   Trigeminal neuralgia Follow-up with neurology outpatient, Dr. Winferd Hatter; last seen 07/16/2023. -- Tegretol  level 5.7 on 07/30/2023 -- Continue Tegretol  200 mg per tube TiD   BPH -- Doxazosin  4 mg per tube daily   Insomnia Reports patient takes 10 mg of melatonin and 50 mg of Benadryl  nightly at home. -- Increased  6/09 :  melatonin to 10 mg nightly.    having issues at night with sleep. Daughter report he takes 50 mg benadryl  at home, and have been doing it for years.  He was able to sleep better last night.   Left heel ulcer, POA Sacral/coccyx skin tear Seen by podiatry at [facility], underwent debridement on 5/22. -- Cleanse L heel wound with NS, apply Bacitracin  ointment to wound bed daily, cover with Telfa nonstick dressing and secure with silicone foam or Kerlix roll gauze whichever is preferred.  -- Apply sacral foam dressing, change every 3 days or PRN    Weakness/debility/deconditioning: -- Continue PT/OT efforts -- Will  need SNF placement   Nutrition Problem: Moderate Malnutrition Etiology: chronic illness    Signs/Symptoms: moderate fat depletion, moderate muscle depletion    Interventions: Tube feeding, Prostat, MVI  Estimated body mass index is 30.23 kg/m as calculated from the following:   Height as of this encounter: 6' (1.829 m).   Weight as of this encounter: 101.1 kg.   DVT prophylaxis: Lovenox  Code Status: DNR-intervention  Family Communication:Daughter at bedside 6/14 Disposition Plan:  Status is: Inpatient Remains inpatient appropriate because: management of SDH    Consultants:  Neurosurgery CCM   Procedures:  Craniotomy   Antimicrobials:    Subjective: He is more alert today, eyes open, follows command, answer  questions. Denies pain.    Objective: Vitals:   08/09/23 2014 08/10/23 0002 08/10/23 0358 08/10/23 0728  BP: 119/60 (!) 122/54 (!) 121/55 (!) 144/56  Pulse: 76 77 81 75  Resp: 16 18 18 16   Temp: 98.9 F (37.2 C) 97.8 F (36.6 C) (!) 97.5 F (36.4 C) (!) 97.4 F (36.3 C)  TempSrc: Oral Oral Oral Oral  SpO2: 97% 97% 97% 99%  Weight:      Height:        Intake/Output Summary (Last 24 hours) at 08/10/2023 1045 Last data filed at 08/10/2023 1016 Gross per 24 hour  Intake 58.76 ml  Output 850 ml  Net -791.24 ml   Filed Weights   08/07/23 0600 08/08/23 0500 08/09/23 0500  Weight: 106.6 kg 106.7 kg 101.1 kg    Examination:  General exam: NAD Respiratory system: CTA Cardiovascular system:  S 1, S 2 RRR Gastrointestinal system: BS present, soft, nt Central nervous system: Awake today, follows command Extremities: no edema  Data Reviewed: I have personally reviewed following labs and imaging studies  CBC: Recent Labs  Lab 08/04/23 0541 08/05/23 0523 08/06/23 0504 08/08/23 1100 08/09/23 1124 08/10/23 0711  WBC 6.0 5.9 6.4 6.2 5.7 4.4  NEUTROABS 3.7  --   --  4.0  --   --   HGB 8.1* 7.9* 7.7* 8.0* 7.6* 7.8*  HCT 26.3* 26.3* 25.0*  25.9* 24.7* 25.4*  MCV 101.9* 101.2* 98.8 100.4* 99.2 101.2*  PLT 220 210 212 220 207 198   Basic Metabolic Panel: Recent Labs  Lab 08/04/23 0541 08/05/23 0523 08/06/23 0504 08/06/23 1544 08/08/23 1100 08/08/23 1822 08/09/23 1124 08/10/23 0711  NA 143 140 139  --  138  --  138 140  K 5.7* 5.4* 5.6* 5.4* 6.0* 5.3* 5.2* 4.7  CL 116* 108 107  --  106  --  107 107  CO2 20* 24 23  --  25  --  23 24  GLUCOSE 126* 137* 114*  --  119*  --  217* 128*  BUN 82* 77* 76*  --  68*  --  60* 54*  CREATININE 1.94* 1.79* 1.66*  --  1.62*  --  1.68* 1.66*  CALCIUM  8.3* 8.0* 8.1*  --  8.3*  --  8.2* 8.3*  MG 3.1* 3.1* 3.0*  --  2.9*  --   --   --   PHOS 4.7* 5.3* 5.5*  --   --   --   --   --    GFR: Estimated Creatinine Clearance: 40.8 mL/min (A) (by C-G formula based on SCr of 1.66 mg/dL (H)). Liver Function Tests: Recent Labs  Lab 08/05/23 0523  AST 17  ALT 6  ALKPHOS 105  BILITOT 0.5  PROT 5.9*  ALBUMIN  2.4*   No results for input(s): LIPASE, AMYLASE in the last 168 hours. No results for input(s): AMMONIA in the last 168 hours. Coagulation Profile: No results for input(s): INR, PROTIME in the last 168 hours. Cardiac Enzymes: No results for input(s): CKTOTAL, CKMB, CKMBINDEX, TROPONINI in the last 168 hours. BNP (last 3 results) No results for input(s): PROBNP in the last 8760 hours. HbA1C: No results for input(s): HGBA1C in the last 72 hours. CBG: Recent Labs  Lab 08/09/23 0349 08/09/23 1119 08/09/23 2015 08/10/23 0003 08/10/23 0729  GLUCAP 109* 227* 178* 110* 134*   Lipid Profile: No results for input(s): CHOL, HDL, LDLCALC, TRIG, CHOLHDL, LDLDIRECT in the last 72 hours. Thyroid  Function Tests: No results for input(s): TSH, T4TOTAL, FREET4, T3FREE, THYROIDAB in the last 72 hours. Anemia Panel: Recent Labs    08/09/23 1124  VITAMINB12 685  FOLATE 17.0  FERRITIN 39  TIBC 374  IRON 57  RETICCTPCT 2.8   Sepsis Labs: No  results for input(s): PROCALCITON, LATICACIDVEN in the last 168 hours.  No results found for this or any previous visit (from the past 240 hours).       Radiology Studies: No results found.      Scheduled Meds:  acetaminophen   650 mg Oral Q6H   artificial tears   Both Eyes Q6H   atorvastatin   10 mg Oral QHS   bacitracin    Topical Daily   carbamazepine   200 mg Oral TID   carbidopa -levodopa   1 tablet Oral TID   Chlorhexidine  Gluconate Cloth  6 each Topical Daily   diphenhydrAMINE   50 mg Oral QHS  docusate  100 mg Oral BID   [START ON 08/11/2023] doxazosin   4 mg Oral Daily   enoxaparin  (LOVENOX ) injection  40 mg Subcutaneous Q24H   [START ON 08/11/2023] famotidine   20 mg Oral Daily   feeding supplement  237 mL Oral BID BM   ferrous sulfate  325 mg Oral Q breakfast   folic acid  1 mg Oral Daily   melatonin  10 mg Oral QHS   [START ON 08/11/2023] modafinil   100 mg Oral Daily   montelukast   10 mg Per Tube QHS   mouth rinse  15 mL Mouth Rinse 4 times per day   [START ON 08/11/2023] polyethylene glycol  17 g Oral Daily   sennosides  5 mL Oral QHS   Continuous Infusions:  sodium chloride  55 mL/hr at 08/10/23 0657     LOS: 19 days    Time spent: 35 minutes.    Danette Duos, MD [facility]   If 7PM-7AM, please contact night-coverage [email or web address]  08/10/2023, 10:45 AM

## 2023-08-11 ENCOUNTER — Inpatient Hospital Stay (HOSPITAL_COMMUNITY)

## 2023-08-11 DIAGNOSIS — Z9889 Other specified postprocedural states: Secondary | ICD-10-CM | POA: Diagnosis not present

## 2023-08-11 LAB — BASIC METABOLIC PANEL WITH GFR
Anion gap: 6 (ref 5–15)
BUN: 45 mg/dL — ABNORMAL HIGH (ref 8–23)
CO2: 24 mmol/L (ref 22–32)
Calcium: 8.3 mg/dL — ABNORMAL LOW (ref 8.9–10.3)
Chloride: 110 mmol/L (ref 98–111)
Creatinine, Ser: 1.61 mg/dL — ABNORMAL HIGH (ref 0.61–1.24)
GFR, Estimated: 42 mL/min — ABNORMAL LOW (ref >60)
Glucose, Bld: 93 mg/dL (ref 70–99)
Potassium: 4.7 mmol/L (ref 3.5–5.1)
Sodium: 140 mmol/L (ref 135–145)

## 2023-08-11 LAB — GLUCOSE, CAPILLARY
Glucose-Capillary: 108 mg/dL — ABNORMAL HIGH (ref 70–99)
Glucose-Capillary: 99 mg/dL (ref 70–99)
Glucose-Capillary: 106 mg/dL — ABNORMAL HIGH (ref 70–99)
Glucose-Capillary: 169 mg/dL — ABNORMAL HIGH (ref 70–99)
Glucose-Capillary: 145 mg/dL — ABNORMAL HIGH (ref 70–99)
Glucose-Capillary: 133 mg/dL — ABNORMAL HIGH (ref 70–99)

## 2023-08-11 LAB — CBC
HCT: 25.6 % — ABNORMAL LOW (ref 39.0–52.0)
Hemoglobin: 7.9 g/dL — ABNORMAL LOW (ref 13.0–17.0)
MCH: 31.1 pg (ref 26.0–34.0)
MCHC: 30.9 g/dL (ref 30.0–36.0)
MCV: 100.8 fL — ABNORMAL HIGH (ref 80.0–100.0)
Platelets: 182 10*3/uL (ref 150–400)
RBC: 2.54 MIL/uL — ABNORMAL LOW (ref 4.22–5.81)
RDW: 15.6 % — ABNORMAL HIGH (ref 11.5–15.5)
WBC: 4.1 10*3/uL (ref 4.0–10.5)
nRBC: 0 % (ref 0.0–0.2)

## 2023-08-11 LAB — URINALYSIS, ROUTINE W REFLEX MICROSCOPIC
Bilirubin Urine: NEGATIVE
Glucose, UA: NEGATIVE mg/dL
Hgb urine dipstick: NEGATIVE
Ketones, ur: NEGATIVE mg/dL
Leukocytes,Ua: NEGATIVE
Nitrite: NEGATIVE
Protein, ur: NEGATIVE mg/dL
Specific Gravity, Urine: 1.017 (ref 1.005–1.030)
pH: 5 (ref 5.0–8.0)

## 2023-08-11 MED ORDER — PANTOPRAZOLE SODIUM 40 MG IV SOLR
40.0000 mg | Freq: Two times a day (BID) | INTRAVENOUS | Status: DC
  Administered 2023-08-11 – 2023-08-12 (×4): 40 mg via INTRAVENOUS
  Filled 2023-08-11 (×4): qty 10

## 2023-08-11 MED ORDER — SIMETHICONE 80 MG PO CHEW
80.0000 mg | CHEWABLE_TABLET | Freq: Four times a day (QID) | ORAL | Status: DC | PRN
  Administered 2023-08-11: 80 mg via ORAL
  Filled 2023-08-11: qty 1

## 2023-08-11 MED ORDER — BUDESONIDE 0.25 MG/2ML IN SUSP
0.2500 mg | Freq: Two times a day (BID) | RESPIRATORY_TRACT | Status: DC
  Administered 2023-08-11 – 2023-08-14 (×6): 0.25 mg via RESPIRATORY_TRACT
  Filled 2023-08-11 (×6): qty 2

## 2023-08-11 MED ORDER — IPRATROPIUM-ALBUTEROL 0.5-2.5 (3) MG/3ML IN SOLN
3.0000 mL | Freq: Four times a day (QID) | RESPIRATORY_TRACT | Status: DC
  Administered 2023-08-11 – 2023-08-14 (×12): 3 mL via RESPIRATORY_TRACT
  Filled 2023-08-11 (×12): qty 3

## 2023-08-11 MED ORDER — OLOPATADINE HCL 0.1 % OP SOLN
1.0000 [drp] | Freq: Two times a day (BID) | OPHTHALMIC | Status: DC
  Administered 2023-08-11 – 2023-08-14 (×7): 1 [drp] via OPHTHALMIC
  Filled 2023-08-11: qty 5

## 2023-08-11 NOTE — Progress Notes (Signed)
 Patient ID: Jack White, male   DOB: 03-27-1940, 83 y.o.   MRN: [id number]  Patient received meal tray and encouraged to eat for more 15 minutes. Would not open mouth except to say he wasn't hungry. Put cover back on food. Will report to next shift.  Genella Kendall, RN

## 2023-08-11 NOTE — Care Management Important Message (Signed)
 Important Message  Patient Details  Name: Jack White MRN: [id number] Date of Birth: 1940/09/07   Important Message Given:  Yes - Medicare IM     Wynonia Hedges 08/11/2023, 4:10 PM

## 2023-08-11 NOTE — Progress Notes (Signed)
 Speech Language Pathology Treatment: Dysphagia;Cognitive-Linguistic  Patient Details Name: Jack White MRN: [id number] DOB: [birth year] Today's Date: 08/11/2023 Time: 9528-4132 SLP Time Calculation (min) (ACUTE ONLY): 32 min  Assessment / Plan / Recommendation Clinical Impression  SLP returned to see pt this afternoon. CXR from earlier today showing improving infiltrates and abdominal films were negative. He is sleepy sitting up in his chair, requiring Max faded to Mod cues to increase level of alertness and sustained attention. Acceptance of POs improved as he became more attentive, and mechanical soft textures were offered as daughter, Genevia Kern, is interested in advancing his diet. Even when at his most alert state, he had prolonged mastication and oral residue of noodles, sometimes in bigger pieces. When considering reduced oral preparation, fluctuating alertness, and esophageal component, it seems a little premature to advance his diet. I expressed my concerns to Genevia Kern and she agreed that it would be too much especially since she cannot be here at every meal to modify his foods if they are not soft enough. SLP had left handout last week about current IDDSI level and Genevia Kern is planning to bring outside food, modified according to current diet. She had a few questions about specific food items, for which I provided education. SLP will continue to follow and advance as able.    HPI HPI: 38 yoM with PMH as below, significant for HTN, HLD, DMT2, possible parkinsons disease, trigeminal neuralgia, CKD 3a, and BPH, who presented after mechanical fall on 5/27, not on AC or antiplatelets who developed N/V, AMS, with left sided weakness found to have large right SDH with 1.72m right to left MLS.  Admitted to NSGY and taken for emergent right craniotomy.  Was seen by PCCM while in Neuro ICU, extubated 5/27 evening.  ICU stay complicated by hypoactive delirium, cortrak placed for meds/ nutrition.  pt transferred  out of ICU 5/30, CTH unchanged 5/31.  Started to develop increased fever curve.  On 6/1 PCCM re-consulted for development on hypoxia, previously on room air with upper airway congestion.  Daughter reports has not been coughing and clearing secretion.      SLP Plan  Continue with current plan of care          Recommendations  Diet recommendations: Dysphagia 2 (fine chop);Thin liquid Liquids provided via: Cup;Straw Medication Administration: Crushed with puree Supervision: Full supervision/cueing for compensatory strategies;Trained caregiver to feed patient;Staff to assist with self feeding Compensations: Slow rate;Small sips/bites Postural Changes and/or Swallow Maneuvers: Seated upright 90 degrees;Upright 30-60 min after meal                  Oral care before and after PO;Oral care BID   Frequent or constant Supervision/Assistance Cognitive communication deficit (R41.841);Dysphagia, unspecified (R13.10)     Continue with current plan of care     Beth Brooke., M.A. CCC-SLP Acute Rehabilitation Services Office: [phone]  Secure chat preferred   08/11/2023, 1:30 PM

## 2023-08-11 NOTE — Progress Notes (Signed)
 PROGRESS NOTE    Jack White  MVH:[id number] DOB: 05/02/1940 DOA: 07/21/2023 PCP: Nestor Banter, MD   Brief Narrative: 83 year old gentleman with past medical history significant for hypertension, hyperlipidemia, diabetes, CKD stage IIIa, possible Parkinson disease, trigeminal neuralgia, BPH who presents to7/2025 from home via EMS after mechanical fall with associated headache, left-sided weakness, nausea vomiting.  Code stroke was initiated.  No anticoagulation or antiplatelets on outpatient.  Workup in the ED was notable for a large right subdural hematoma with 1.6 mm right-to-left midline shift.  Neurosurgery was consulted and taken for emergent right craniotomy, admitted to the neuro ICU under the neurosurgical service with PCCM consulted.  Significant hospital events: 5/27: Admit by neurosurgery for SDH s/p craniotomy, PCCM consulted; extubated. 5/28: Intermittent agitation/confusion, difficulty sleeping 5/29: Restlessness overnight, sleeping poorly on a.m. assessment 5/30: Repeat CT head stable, transferred out of ICU; Eval by CIR>does not meet level of care 6/1: CCM reconsulted for hypoxia, CT angiogram chest negative for PE, small bilateral pleural effusions, multifocal groundglass opacities bilateral upper lobes, right middle lobe concerning for infectious process. 6/2: re-eval by CIR> continues to not meet level of care and signed off 6/3: Encephalopathy improved, following commands for RN (thumbs up, wiggling toes, stating name)Fatigues quickly, but more awake than prior  6/4: Hemoglobin down to 6.9, 1 unit PRBC ordered 6/5: Creatinine rising, sodium improved with free water , IVC on POCUS collapsible with marked respiratory variation, IV fluids given; concern for possible aspiration event following swallow evaluation. 6/6: Creatinine improved, stopped IV fluids; mental status waxing/waning; started on Provigil  6/9: Transferred to [facility]; pain overnight, given dose of morphine  by  PCCM coverage; awaiting MBS by SLP 6/10: MBS completed, started on clear liquid diet. 6/11: Advanced to dysphagia 2 diet.  Staples removed by neurosurgery.  Transitioned to nocturnal feeds only. 6/12.  Transferred to med telemetry floor.    Assessment & Plan:   Principal Problem:   Status post surgery Active Problems:   Subdural hematoma (HCC)   Malnutrition of moderate degree  Traumatic subdural hematoma with midline shift s/p craniotomy; - Patient presents after mechanical fall with associated left-sided weakness, nausea vomiting and confusion. - CT head showed large right subdural hematoma with 1.6 mm right-to-left midline shift. - Patient was taken emergently for craniotomy by neurosurgery.  - Repeat CT head 5/28 with decreased right subdural hematoma mass effect and left midline shift.  Right subdural drain in place. -CT head 5/31: residual subdural hematoma 1.5 cm in thickness with mass effect right cerebral hemisphere 5 to 6 mm right-to-left shift, smaller left-sided SDH without mass effect, not significant change from prior.  -Staples removed by neurosurgery 08/07/2023 - Patient will need skilled nursing facility for rehab -Patient has been more alert, and answering some questions.  CT head 6/15 looks stable.   Abdominal Pain:  Patient is complaining of abdominal pain, tender supra pubic area.  -Bladder scan 250-- monitor.  Check UA if negative for infection will get CT abdomen and pelvis without contrast.  Start IV PPI  BL wheezing:  -Added Albuterol , Pulmicort.  -Chest x ray: Improving pulmonary infiltrates with residual left lower lobe retrocardiac atelectasis.  Hyperkalemia - Received insulin  and dextrose . Status post Lokelma  . -In setting AKI.  -K down to 4.7  Acute renal failure on CKD stage IIIA;  Patient baseline creatinine 1.3--- 1.5 - Creatinine peak 2.7---1.6 Renal function improved   Hypoactive delirium: -On Provigil  by PCCM - Delirium   precautions  Aspiration pneumonia: -6/01: Patient became hypoxic.  -CTA chest:  negative for PE but with small bilateral pleural effusions and multifocal groundglass opacities bilateral upper lobes, right middle lobe concerning for infectious process, likely aspiration.  -Completed 5 days of Unasyn  on 6/06  Moderate Malnutrition;  He removed Tube feeding.  Discussed with family, plan to see how he does over weekend. He ate 100% breakfast, he needs to be fed.   Anemia Received 1 unit of packed red blood cells 6//2025 Monitor.  Anemia panel. T sat low, started  Iron supplement and  folic acid.  Monitor Hb. 7.6--7.8--7.9  Hypertension - BP increasing. Resume low dose  metoprolol   Hyperlipidemia -on lipitor.   DM2 Hemoglobin A1c 6.5%. -- Hold Semglee  10 units [place] daily (today, to see how he does. He CBG yesterday at 30.  -holding insulin  regimen to avoid hypoglycemia. Monitor oral intake.  CBG 93---169  Parkinson's disease -- Sinemet  1 tablet 3 times daily  Daughter report, that Dr Tat, was questioning diagnosis for Parkinson. She was asking if his medications can be reduce to help prevent sleepiness.  -Neurology consulted. Dat Scan are not done inpatient.  -neurology recommend to continue current dose of sinemet .   Trigeminal neuralgia Follow-up with neurology outpatient, Dr. Winferd Hatter; last seen 07/16/2023. -- Tegretol  level 5.7 on 07/30/2023 -- Continue Tegretol  200 mg per tube TiD   BPH -- Doxazosin  4 mg per tube daily   Insomnia Reports patient takes 10 mg of melatonin and 50 mg of Benadryl  nightly at home. -- Increased  6/09 :  melatonin to 10 mg nightly.    having issues at night with sleep. Daughter report he takes 50 mg benadryl  at home, and have been doing it for years.  He was able to sleep better last night.   Left heel ulcer, POA Sacral/coccyx skin tear Se[facility] by [facility] at [facility], underwent debridement on 5/22. -- Cleanse L heel wound with NS, apply Bacitracin  ointment  to wound bed daily, cover with Telfa nonstick dressing and secure with silicone foam or Kerlix roll gauze whichever is preferred.  -- Apply sacral foam dressing, change every 3 days or PRN    Weakness/debility/deconditioning: -- Continue PT/OT efforts -- Will need SNF placement   Nutrition Problem: Moderate Malnutrition Etiology: chronic illness    Signs/Symptoms: moderate fat depletion, moderate muscle depletion    Interventions: Tube feeding, Prostat, MVI  Estimated body mass index is 30.23 kg/m as calculated from the following:   Height as of this encounter: 6' (1.829 m).   Weight as of this encounter: 101.1 kg.   DVT prophylaxis: Lovenox  Code Status: DNR-intervention  Family Communication:Daughter at bedside 6/16 Disposition Plan:  Status is: Inpatient Remains inpatient appropriate because: management of SDH    Consultants:  Neurosurgery CCM   Procedures:  Craniotomy   Antimicrobials:    Subjective: He is alert, answer questions. He appears uncomfortable. He has been having abdominal pain. He is wheezing today as well/   Objective: Vitals:   08/10/23 2338 08/11/23 0753 08/11/23 0900 08/11/23 1150  BP: (!) 108/43 125/85  119/60  Pulse: 66 86 76 79  Resp: 18 19 19 19   Temp: (!) 97.3 F (36.3 C)     TempSrc: Oral     SpO2:  97% 98% 95%  Weight:      Height:        Intake/Output Summary (Last 24 hours) at 08/11/2023 1413 Last data filed at 08/11/2023 0600 Gross per 24 hour  Intake --  Output 950 ml  Net -950 ml   [employer]  08/07/23 0600 08/08/23 0500 08/09/23 0500  Weight: 106.6 kg 106.7 kg 101.1 kg    Examination:  General exam: Uncomfortable.  Respiratory system: BL wheezing.  Cardiovascular system: S 1, S 2 RRR Gastrointestinal system: BS present, soft, supra-pubic tenderness.  Central nervous system: Awake, follows some command Extremities: no edema  Data Reviewed: I have personally reviewed following labs and imaging  studies  CBC: Recent Labs  Lab 08/06/23 0504 08/08/23 1100 08/09/23 1124 08/10/23 0711 08/11/23 0436  WBC 6.4 6.2 5.7 4.4 4.1  NEUTROABS  --  4.0  --   --   --   HGB 7.7* 8.0* 7.6* 7.8* 7.9*  HCT 25.0* 25.9* 24.7* 25.4* 25.6*  MCV 98.8 100.4* 99.2 101.2* 100.8*  PLT 212 220 207 198 182   Basic Metabolic Panel: Recent Labs  Lab 08/05/23 0523 08/06/23 0504 08/06/23 1544 08/08/23 1100 08/08/23 1822 08/09/23 1124 08/10/23 0711 08/11/23 0436  NA 140 139  --  138  --  138 140 140  K 5.4* 5.6*   < > 6.0* 5.3* 5.2* 4.7 4.7  CL 108 107  --  106  --  107 107 110  CO2 24 23  --  25  --  23 24 24   GLUCOSE 137* 114*  --  119*  --  217* 128* 93  BUN 77* 76*  --  68*  --  60* 54* 45*  CREATININE 1.79* 1.66*  --  1.62*  --  1.68* 1.66* 1.61*  CALCIUM  8.0* 8.1*  --  8.3*  --  8.2* 8.3* 8.3*  MG 3.1* 3.0*  --  2.9*  --   --   --   --   PHOS 5.3* 5.5*  --   --   --   --   --   --    < > = values in this interval not displayed.   GFR: Estimated Creatinine Clearance: 42 mL/min (A) (by C-G formula based on SCr of 1.61 mg/dL (H)). Liver Function Tests: Recent Labs  Lab 08/05/23 0523  AST 17  ALT 6  ALKPHOS 105  BILITOT 0.5  PROT 5.9*  ALBUMIN  2.4*   No results for input(s): LIPASE, AMYLASE in the last 168 hours. No results for input(s): AMMONIA in the last 168 hours. Coagulation Profile: No results for input(s): INR, PROTIME in the last 168 hours. Cardiac Enzymes: No results for input(s): CKTOTAL, CKMB, CKMBINDEX, TROPONINI in the last 168 hours. BNP (last 3 results) No results for input(s): PROBNP in the last 8760 hours. HbA1C: No results for input(s): HGBA1C in the last 72 hours. CBG: Recent Labs  Lab 08/10/23 1934 08/10/23 2319 08/11/23 0425 08/11/23 0752 08/11/23 1205  GLUCAP 198* 145* 99 106* 169*   Lipid Profile: No results for input(s): CHOL, HDL, LDLCALC, TRIG, CHOLHDL, LDLDIRECT in the last 72 hours. Thyroid  Function  Tests: No results for input(s): TSH, T4TOTAL, FREET4, T3FREE, THYROIDAB in the last 72 hours. Anemia Panel: Recent Labs    08/09/23 1124  VITAMINB12 685  FOLATE 17.0  FERRITIN 39  TIBC 374  IRON 57  RETICCTPCT 2.8   Sepsis Labs: No results for input(s): PROCALCITON, LATICACIDVEN in the last 168 hours.  No results found for this or any previous visit (from the past 240 hours).       Radiology Studies: DG Abd 1 View Result Date: 08/11/2023 CLINICAL DATA:  Abdominal pain EXAM: ABDOMEN - 1 VIEW COMPARISON:  None Available. FINDINGS: The bowel gas pattern is normal. No radio-opaque calculi or other  significant radiographic abnormality are seen. IMPRESSION: Negative. No obstruction residual contrast throughout the distal colon Electronically Signed   By: [name] M.D.   On: 08/11/2023 11:00   DG CHEST PORT 1 VIEW Result Date: 08/11/2023 CLINICAL DATA:  Wheezing EXAM: PORTABLE CHEST 1 VIEW COMPARISON:  July 31, 2023 FINDINGS: Improving pulmonary infiltrates with residual left lower lobe retrocardiac atelectasis without consolidations or pleural effusions Heart normal size IMPRESSION: Improving pulmonary infiltrates with residual left lower lobe retrocardiac atelectasis. Electronically Signed   By: [name] M.D.   On: 08/11/2023 10:59   CT HEAD WO CONTRAST ( ) Result Date: 08/10/2023 EXAM: CT HEAD WITHOUT CONTRAST 08/10/2023 12:05:10 PM TECHNIQUE: CT of the head was performed without the administration of intravenous contrast. Automated exposure control, iterative reconstruction, and/or weight based adjustment of the mA/kV was utilized to reduce the radiation dose to as low as reasonably achievable. COMPARISON: CT head without contrast 07/26/2023. CT head without contrast 07/25/2023. CLINICAL HISTORY: Assess for subdural hematoma enlargement. FINDINGS: BRAIN AND VENTRICLES: Right frontal craniotomy is again noted. Expected evolution of the extra-axial collection is  noted. Decreased density is present within the collection with some layering posteriorly. Persistent mass effect is present with effacement of the sulci. Midline shift of 6 mm is similar to the prior exam. No new hemorrhage is present. No cortical infarct is present. ORBITS: Bilateral lens replacements are noted. The globes and orbits are otherwise within normal limits. SINUSES: The visualized paranasal sinuses and mastoid air cells demonstrate no acute abnormality. SOFT TISSUES AND SKULL: No acute abnormality of the visualized skull or soft tissues. VASCULATURE: Atherosclerotic changes are present within the cavernous internal carotid arteries bilaterally. No hyperdense vessel is present. IMPRESSION: 1. Expected evolution of the extra-axial collection with decreased density and layering posteriorly. Persistent mass effect with effacement of the sulci and midline shift of 6 mm, similar to the prior exam. 2. No new hemorrhage or cortical infarct. Electronically signed by: Audree Leas MD 08/10/2023 12:15 PM EDT RP Workstation: [id number]        Scheduled Meds:  acetaminophen   650 mg Oral Q6H   artificial tears   Both Eyes Q6H   atorvastatin   10 mg Oral QHS   bacitracin    Topical Daily   budesonide (PULMICORT) nebulizer solution  0.25 mg Nebulization BID   carbamazepine   200 mg Oral TID   carbidopa -levodopa   1 tablet Oral TID   Chlorhexidine  Gluconate Cloth  6 each Topical Daily   diphenhydrAMINE   50 mg Oral QHS   docusate  100 mg Oral BID   doxazosin   4 mg Oral Daily   enoxaparin  (LOVENOX ) injection  40 mg Subcutaneous Q24H   famotidine   20 mg Oral Daily   feeding supplement  1 Container Oral TID BM   ferrous sulfate  325 mg Oral Q breakfast   folic acid  1 mg Oral Daily   ipratropium-albuterol   3 mL Nebulization Q6H   melatonin  10 mg Oral QHS   metoprolol  tartrate  12.5 mg Oral BID   modafinil   100 mg Oral Daily   montelukast   10 mg Per Tube QHS   olopatadine  1 drop Both Eyes  BID   mouth rinse  15 mL Mouth Rinse 4 times per day   pantoprazole  (PROTONIX ) IV  40 mg Intravenous Q12H   polyethylene glycol  17 g Oral Daily   sennosides  5 mL Oral QHS   Continuous Infusions:     LOS: 20 days    Time spent:  35 minutes.    Danette Duos, MD [facility]   If 7PM-7AM, please contact night-coverage [email or web address]  08/11/2023, 2:13 PM

## 2023-08-11 NOTE — Progress Notes (Signed)
 Physical Therapy Treatment Patient Details Name: Jack White MRN: [id number] DOB: 11/11/1940 Today's Date: 08/11/2023   History of Present Illness Patient is an 83 y.o. male who presented to the ED via EMS 07/22/2023 as a code stroke after becoming unresponsive at home. He was intubated upon arrival. CT head showed large right holohemispheric acute SDH. Neurosurgery was consulted and patient underwent a  right craniotomy on 07/22/23. 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    PT Comments  Pt demonstrates incremental progress towards his physical therapy goals. Performed standing trials from edge of bed to RW x 3 with mod-maxA +2. Unable to weight shift to pivot so utilized maximove from bed to chair. Patient will benefit from continued inpatient follow up therapy, <3 hours/day.    If plan is discharge home, recommend the following: Two people to help with walking and/or transfers;Two people to help with bathing/dressing/bathroom   Can travel by private vehicle     No  Equipment Recommendations  Other (comment) (defer)    Recommendations for Other Services       Precautions / Restrictions Precautions Precautions: Fall Recall of Precautions/Restrictions: Impaired Restrictions Weight Bearing Restrictions Per Provider Order: No     Mobility  Bed Mobility Overal bed mobility: Needs Assistance Bed Mobility: Supine to Sit     Supine to sit: Total assist, +2 for physical assistance     General bed mobility comments: Limited by cognition rather than actual strength - no initiation of movement    Transfers Overall transfer level: Needs assistance Equipment used: Rolling walker (2 wheels) Transfers: Sit to/from Stand, Bed to chair/wheelchair/BSC Sit to Stand: Mod assist, Max assist, +2 physical assistance           General transfer comment: MaxA + 2 on first 2 trials to stand, modA + 2 on third trial. Dense cueing for nose over  toes,' for anterior weight shift, pushing down, towards ground and blocking feet to prevent slide. Unable to pivot so utilized maxi move from bed to chair Transfer via Lift Equipment: Maximove  Ambulation/Gait               General Gait Details: unable   Stairs             Wheelchair Mobility     Tilt Bed    Modified Rankin (Stroke Patients Only) Modified Rankin (Stroke Patients Only) Pre-Morbid Rankin Score: Moderate disability Modified Rankin: Severe disability     Balance Overall balance assessment: Needs assistance Sitting-balance support: Feet supported Sitting balance-Leahy Scale: Poor Sitting balance - Comments: Left lateral laen, requiring minA   Standing balance support: Bilateral upper extremity supported Standing balance-Leahy Scale: Zero                              Communication Communication Communication: Impaired Factors Affecting Communication: Hearing impaired;Reduced clarity of speech  Cognition Arousal: Alert, Lethargic (intermittently lethargic) Behavior During Therapy: Flat affect   PT - Cognitive impairments: Memory, Awareness, Attention, Initiation, Sequencing, Problem solving, Safety/Judgement                       PT - Cognition Comments: Pt with decreased initiation of motor tasks, stating, be patient, with direct commands. Asking for back scratch Following commands: Impaired Following commands impaired: Follows one step commands inconsistently    Cueing Cueing Techniques: Verbal cues, Tactile cues, Visual cues  Exercises Other Exercises Other Exercises:  Supine: LUE stretch into elbow extension    General Comments        Pertinent Vitals/Pain Pain Assessment Pain Assessment: Faces Faces Pain Scale: Hurts little more Pain Location: does not specify Pain Descriptors / Indicators: Moaning, Restless Pain Intervention(s): Monitored during session, Limited activity within patient's tolerance     Home Living                          Prior Function            PT Goals (current goals can now be found in the care plan section) Acute Rehab PT Goals Patient Stated Goal: pt daughter agreeable to rehab Potential to Achieve Goals: Fair Progress towards PT goals: Progressing toward goals    Frequency    Min 2X/week      PT Plan      Co-evaluation              AM-PAC PT 6 Clicks Mobility   Outcome Measure  Help needed turning from your back to your side while in a flat bed without using bedrails?: Total Help needed moving from lying on your back to sitting on the side of a flat bed without using bedrails?: Total Help needed moving to and from a bed to a chair (including a wheelchair)?: Total Help needed standing up from a chair using your arms (e.g., wheelchair or bedside chair)?: Total Help needed to walk in hospital room?: Total Help needed climbing 3-5 steps with a railing? : Total 6 Click Score: 6    End of Session Equipment Utilized During Treatment: Gait belt Activity Tolerance: Patient tolerated treatment well Patient left: in chair;with call bell/phone within reach;with chair alarm set Nurse Communication: Mobility status;Need for lift equipment PT Visit Diagnosis: Unsteadiness on feet (R26.81);History of falling (Z91.81);Difficulty in walking, not elsewhere classified (R26.2);Pain     Time: 1117-1150 PT Time Calculation (min) (ACUTE ONLY): 33 min  Charges:    $Therapeutic Activity: 23-37 mins PT General Charges $$ ACUTE PT VISIT: 1 Visit                     Verdia Glad, PT, DPT Acute Rehabilitation Services Office [phone]    Claria Crofts 08/11/2023, 3:46 PM

## 2023-08-11 NOTE — Progress Notes (Signed)
 SLP Cancellation Note  Patient Details Name: Jack White MRN: [id number] DOB: [birth year]   Cancelled treatment:       Reason Eval/Treat Not Completed: Medical issues which prohibited therapy. Attempted to see pt this morning. Pt getting a CXR done at the moment. Per RN, he was alert and appropriate this morning but now is wheezing and reporting difficulty breathing. She asks that we hold SLP at the moment. Will f/u as able.     Beth Brooke., M.A. CCC-SLP Acute Rehabilitation Services Office: [phone]  Secure chat preferred  08/11/2023, 10:40 AM

## 2023-08-11 NOTE — Plan of Care (Signed)
  Problem: Activity: Goal: Ability to tolerate increased activity will improve Outcome: Progressing   Problem: Respiratory: Goal: Ability to maintain a clear airway and adequate ventilation will improve Outcome: Progressing   Problem: Role Relationship: Goal: Method of communication will improve Outcome: Progressing   Problem: Education: Goal: Knowledge of General Education information will improve Description: Including pain rating scale, medication(s)/side effects and non-pharmacologic comfort measures Outcome: Progressing   Problem: Health Behavior/Discharge Planning: Goal: Ability to manage health-related needs will improve Outcome: Progressing   Problem: Clinical Measurements: Goal: Ability to maintain clinical measurements within normal limits will improve Outcome: Progressing Goal: Will remain free from infection Outcome: Progressing Goal: Diagnostic test results will improve Outcome: Progressing Goal: Respiratory complications will improve Outcome: Progressing Goal: Cardiovascular complication will be avoided Outcome: Progressing   Problem: Activity: Goal: Risk for activity intolerance will decrease Outcome: Progressing   Problem: Nutrition: Goal: Adequate nutrition will be maintained Outcome: Progressing   Problem: Coping: Goal: Level of anxiety will decrease Outcome: Progressing   Problem: Elimination: Goal: Will not experience complications related to bowel motility Outcome: Progressing Goal: Will not experience complications related to urinary retention Outcome: Progressing   Problem: Pain Managment: Goal: General experience of comfort will improve and/or be controlled Outcome: Progressing   Problem: Safety: Goal: Ability to remain free from injury will improve Outcome: Progressing   Problem: Skin Integrity: Goal: Risk for impaired skin integrity will decrease Outcome: Progressing   Problem: Education: Goal: Ability to describe self-care  measures that may prevent or decrease complications (Diabetes Survival Skills Education) will improve Outcome: Progressing Goal: Individualized Educational Video(s) Outcome: Progressing   Problem: Coping: Goal: Ability to adjust to condition or change in health will improve Outcome: Progressing   Problem: Fluid Volume: Goal: Ability to maintain a balanced intake and output will improve Outcome: Progressing   Problem: Health Behavior/Discharge Planning: Goal: Ability to identify and utilize available resources and services will improve Outcome: Progressing Goal: Ability to manage health-related needs will improve Outcome: Progressing   Problem: Metabolic: Goal: Ability to maintain appropriate glucose levels will improve Outcome: Progressing   Problem: Nutritional: Goal: Maintenance of adequate nutrition will improve Outcome: Progressing Goal: Progress toward achieving an optimal weight will improve Outcome: Progressing   Problem: Skin Integrity: Goal: Risk for impaired skin integrity will decrease Outcome: Progressing   Problem: Tissue Perfusion: Goal: Adequacy of tissue perfusion will improve Outcome: Progressing   Problem: Education: Goal: Knowledge of the prescribed therapeutic regimen will improve Outcome: Progressing   Problem: Clinical Measurements: Goal: Usual level of consciousness will be regained or maintained. Outcome: Progressing Goal: Neurologic status will improve Outcome: Progressing Goal: Ability to maintain intracranial pressure will improve Outcome: Progressing   Problem: Skin Integrity: Goal: Demonstration of wound healing without infection will improve Outcome: Progressing

## 2023-08-12 DIAGNOSIS — Z9889 Other specified postprocedural states: Secondary | ICD-10-CM | POA: Diagnosis not present

## 2023-08-12 LAB — BASIC METABOLIC PANEL WITH GFR
Anion gap: 10 (ref 5–15)
BUN: 37 mg/dL — ABNORMAL HIGH (ref 8–23)
CO2: 19 mmol/L — ABNORMAL LOW (ref 22–32)
Calcium: 8.4 mg/dL — ABNORMAL LOW (ref 8.9–10.3)
Chloride: 111 mmol/L (ref 98–111)
Creatinine, Ser: 1.48 mg/dL — ABNORMAL HIGH (ref 0.61–1.24)
GFR, Estimated: 46 mL/min — ABNORMAL LOW (ref >60)
Glucose, Bld: 182 mg/dL — ABNORMAL HIGH (ref 70–99)
Potassium: 4.5 mmol/L (ref 3.5–5.1)
Sodium: 140 mmol/L (ref 135–145)

## 2023-08-12 LAB — GLUCOSE, CAPILLARY
Glucose-Capillary: 109 mg/dL — ABNORMAL HIGH (ref 70–99)
Glucose-Capillary: 116 mg/dL — ABNORMAL HIGH (ref 70–99)
Glucose-Capillary: 119 mg/dL — ABNORMAL HIGH (ref 70–99)
Glucose-Capillary: 121 mg/dL — ABNORMAL HIGH (ref 70–99)
Glucose-Capillary: 125 mg/dL — ABNORMAL HIGH (ref 70–99)
Glucose-Capillary: 147 mg/dL — ABNORMAL HIGH (ref 70–99)
Glucose-Capillary: 196 mg/dL — ABNORMAL HIGH (ref 70–99)

## 2023-08-12 LAB — CBC
HCT: 26.5 % — ABNORMAL LOW (ref 39.0–52.0)
Hemoglobin: 8.2 g/dL — ABNORMAL LOW (ref 13.0–17.0)
MCH: 31.7 pg (ref 26.0–34.0)
MCHC: 30.9 g/dL (ref 30.0–36.0)
MCV: 102.3 fL — ABNORMAL HIGH (ref 80.0–100.0)
Platelets: 176 10*3/uL (ref 150–400)
RBC: 2.59 MIL/uL — ABNORMAL LOW (ref 4.22–5.81)
RDW: 15.8 % — ABNORMAL HIGH (ref 11.5–15.5)
WBC: 4.7 10*3/uL (ref 4.0–10.5)
nRBC: 0 % (ref 0.0–0.2)

## 2023-08-12 MED ORDER — BOOST PLUS PO LIQD
237.0000 mL | Freq: Three times a day (TID) | ORAL | Status: DC
  Administered 2023-08-12 – 2023-08-14 (×6): 237 mL via ORAL
  Filled 2023-08-12 (×7): qty 237

## 2023-08-12 NOTE — Plan of Care (Signed)
  Problem: Activity: Goal: Ability to tolerate increased activity will improve Outcome: Progressing   Problem: Respiratory: Goal: Ability to maintain a clear airway and adequate ventilation will improve Outcome: Progressing   Problem: Role Relationship: Goal: Method of communication will improve Outcome: Progressing   Problem: Education: Goal: Knowledge of General Education information will improve Description: Including pain rating scale, medication(s)/side effects and non-pharmacologic comfort measures Outcome: Progressing   Problem: Health Behavior/Discharge Planning: Goal: Ability to manage health-related needs will improve Outcome: Progressing   Problem: Clinical Measurements: Goal: Ability to maintain clinical measurements within normal limits will improve Outcome: Progressing Goal: Will remain free from infection Outcome: Progressing Goal: Diagnostic test results will improve Outcome: Progressing Goal: Respiratory complications will improve Outcome: Progressing Goal: Cardiovascular complication will be avoided Outcome: Progressing   Problem: Activity: Goal: Risk for activity intolerance will decrease Outcome: Progressing   Problem: Nutrition: Goal: Adequate nutrition will be maintained Outcome: Progressing   Problem: Coping: Goal: Level of anxiety will decrease Outcome: Progressing   Problem: Elimination: Goal: Will not experience complications related to bowel motility Outcome: Progressing Goal: Will not experience complications related to urinary retention Outcome: Progressing   Problem: Pain Managment: Goal: General experience of comfort will improve and/or be controlled Outcome: Progressing   Problem: Safety: Goal: Ability to remain free from injury will improve Outcome: Progressing   Problem: Skin Integrity: Goal: Risk for impaired skin integrity will decrease Outcome: Progressing   Problem: Education: Goal: Ability to describe self-care  measures that may prevent or decrease complications (Diabetes Survival Skills Education) will improve Outcome: Progressing Goal: Individualized Educational Video(s) Outcome: Progressing   Problem: Coping: Goal: Ability to adjust to condition or change in health will improve Outcome: Progressing   Problem: Fluid Volume: Goal: Ability to maintain a balanced intake and output will improve Outcome: Progressing   Problem: Health Behavior/Discharge Planning: Goal: Ability to identify and utilize available resources and services will improve Outcome: Progressing Goal: Ability to manage health-related needs will improve Outcome: Progressing   Problem: Metabolic: Goal: Ability to maintain appropriate glucose levels will improve Outcome: Progressing   Problem: Nutritional: Goal: Maintenance of adequate nutrition will improve Outcome: Progressing Goal: Progress toward achieving an optimal weight will improve Outcome: Progressing   Problem: Skin Integrity: Goal: Risk for impaired skin integrity will decrease Outcome: Progressing   Problem: Tissue Perfusion: Goal: Adequacy of tissue perfusion will improve Outcome: Progressing   Problem: Education: Goal: Knowledge of the prescribed therapeutic regimen will improve Outcome: Progressing   Problem: Clinical Measurements: Goal: Usual level of consciousness will be regained or maintained. Outcome: Progressing Goal: Neurologic status will improve Outcome: Progressing Goal: Ability to maintain intracranial pressure will improve Outcome: Progressing   Problem: Skin Integrity: Goal: Demonstration of wound healing without infection will improve Outcome: Progressing

## 2023-08-12 NOTE — Progress Notes (Addendum)
 PROGRESS NOTE    Jack White  WUJ:[id number] DOB: Aug 10, 1940 DOA: 07/21/2023 PCP: Nestor Banter, MD   Brief Narrative: 83 year old gentleman with past medical history significant for hypertension, hyperlipidemia, diabetes, CKD stage IIIa, possible Parkinson disease, trigeminal neuralgia, BPH who presents to7/2025 from home via EMS after mechanical fall with associated headache, left-sided weakness, nausea vomiting.  Code stroke was initiated.  No anticoagulation or antiplatelets on outpatient.  Workup in the ED was notable for a large right subdural hematoma with 1.6 mm right-to-left midline shift.  Neurosurgery was consulted and taken for emergent right craniotomy, admitted to the neuro ICU under the neurosurgical service with PCCM consulted.  Significant hospital events: 5/27: Admit by neurosurgery for SDH s/p craniotomy, PCCM consulted; extubated. 5/28: Intermittent agitation/confusion, difficulty sleeping 5/29: Restlessness overnight, sleeping poorly on a.m. assessment 5/30: Repeat CT head stable, transferred out of ICU; Eval by CIR>does not meet level of care 6/1: CCM reconsulted for hypoxia, CT angiogram chest negative for PE, small bilateral pleural effusions, multifocal groundglass opacities bilateral upper lobes, right middle lobe concerning for infectious process. 6/2: re-eval by CIR> continues to not meet level of care and signed off 6/3: Encephalopathy improved, following commands for RN (thumbs up, wiggling toes, stating name)Fatigues quickly, but more awake than prior  6/4: Hemoglobin down to 6.9, 1 unit PRBC ordered 6/5: Creatinine rising, sodium improved with free water , IVC on POCUS collapsible with marked respiratory variation, IV fluids given; concern for possible aspiration event following swallow evaluation. 6/6: Creatinine improved, stopped IV fluids; mental status waxing/waning; started on Provigil  6/9: Transferred to [facility]; pain overnight, given dose of morphine  by  PCCM coverage; awaiting MBS by SLP 6/10: MBS completed, started on clear liquid diet. 6/11: Advanced to dysphagia 2 diet.  Staples removed by neurosurgery.  Transitioned to nocturnal feeds only. 6/12.  Transferred to med telemetry floor. 6/17: Mental status fluctuates, he has days he is more alert, others he is more sleepy.     Assessment & Plan:   Principal Problem:   Status post surgery Active Problems:   Subdural hematoma (HCC)   Malnutrition of moderate degree  Traumatic subdural hematoma with midline shift s/p craniotomy; - Patient presents after mechanical fall with associated left-sided weakness, nausea vomiting and confusion. - CT head showed large right subdural hematoma with 1.6 mm right-to-left midline shift. - Patient was taken emergently for craniotomy by neurosurgery.  - Repeat CT head 5/28 with decreased right subdural hematoma mass effect and left midline shift.  Right subdural drain in place. -CT head 5/31: residual subdural hematoma 1.5 cm in thickness with mass effect right cerebral hemisphere 5 to 6 mm right-to-left shift, smaller left-sided SDH without mass effect, not significant change from prior.  -Staples removed by neurosurgery 08/07/2023 - Patient will need skilled nursing facility for rehab -Patient has been more alert, and answering some questions. MS fluctuates, he has days he is more sleepy.  -CT head 6/15 looks stable.   Abdominal Pain: Resolved with Gas -X 6-16:Patient is complaining of abdominal pain, tender supra pubic area.   Start IV PPI No significant Bladder retention.  UA negative for infection.  Pain resolved with gas x, decision was made to defer CT abdomen.   BL wheezing:  -Continue  Albuterol , Pulmicort.  -Chest x ray: Improving pulmonary infiltrates with residual left lower lobe retrocardiac atelectasis.  Hyperkalemia - Received insulin  and dextrose . Status post Lokelma  . -In setting AKI.  -K down to 4.7  Acute renal failure on  CKD stage IIIA;  Patient baseline creatinine 1.3--- 1.5 - Creatinine peak 2.7---1.6 Renal function stable.    Hypoactive delirium: -On Provigil  by PCCM - Delirium  precautions  Aspiration pneumonia: -6/01: Patient became hypoxic.  -CTA chest: negative for PE but with small bilateral pleural effusions and multifocal groundglass opacities bilateral upper lobes, right middle lobe concerning for infectious process, likely aspiration.  -Completed 5 days of Unasyn  on 6/06  Moderate Malnutrition;  He removed Tube feeding.  Discussed with family, plan to see how he does over weekend. He ate 100% breakfast, he needs to be fed.   Anemia Received 1 unit of packed red blood cells 6//2025 Monitor.  Anemia panel. T sat low, started  Iron supplement and  folic acid.  Monitor Hb. 7.6--7.8--7.9--8  Hypertension - BP increasing. Resume low dose  metoprolol   Hyperlipidemia -on lipitor.   DM2 Hemoglobin A1c 6.5%. -- Hold Semglee  10 units Bettles daily (today, to see how he does. He CBG yesterday at 30.  -holding insulin  regimen to avoid hypoglycemia. Monitor oral intake.  CBG 93---169  Parkinson's disease -- Sinemet  1 tablet 3 times daily  Daughter report, that Dr Tat, was questioning diagnosis for Parkinson. She was asking if his medications can be reduce to help prevent sleepiness.  -Neurology consulted. Dat Scan are not done inpatient.  -neurology recommend to continue current dose of sinemet .   Trigeminal neuralgia Follow-up with neurology outpatient, Dr. Winferd Hatter; last seen 07/16/2023. -- Tegretol  level 5.7 on 07/30/2023 -- Continue Tegretol  200 mg per tube TiD   BPH -- Doxazosin  4 mg per tube daily   Insomnia Reports patient takes 10 mg of melatonin and 50 mg of Benadryl  nightly at home. -- Increased  6/09 :  melatonin to 10 mg nightly.    having issues at night with sleep. Daughter report he takes 50 mg benadryl  at home, and have been doing it for years.  He was able to sleep better.    Left heel ulcer, POA Sacral/coccyx skin tear Seen by podiatry at [facility], underwent debridement on 5/22. -- Cleanse L heel wound with NS, apply Bacitracin  ointment to wound bed daily, cover with Telfa nonstick dressing and secure with silicone foam or Kerlix roll gauze whichever is preferred.  -- Apply sacral foam dressing, change every 3 days or PRN    Weakness/debility/deconditioning: -- Continue PT/OT efforts -- Will need SNF placement   Nutrition Problem: Moderate Malnutrition Etiology: chronic illness    Signs/Symptoms: moderate fat depletion, moderate muscle depletion    Interventions: Tube feeding, Prostat, MVI  Estimated body mass index is 30.23 kg/m as calculated from the following:   Height as of this encounter: 6' (1.829 m).   Weight as of this encounter: 101.1 kg.   DVT prophylaxis: Lovenox  Code Status: DNR-intervention  Family Communication: Daughter at bedside 6/17 Disposition Plan:  Status is: Inpatient Remains inpatient appropriate because: management of SDH, Might be able to be transfer to SNF 6/18 or 6/19    Consultants:  Neurosurgery CCM   Procedures:  Craniotomy   Antimicrobials:    Subjective: He was sleepy during my evaluation in the morning.  Per daughter he was awake earlier, he was answering questions. He was asking for his phone. He wanted to have something to drinks last night and didn't get it.  Came at noon and he was eating lunch, awake and answer some questions.   Objective: Vitals:   08/12/23 0733 08/12/23 0749 08/12/23 0823 08/12/23 1320  BP: (!) 177/78  (!) 177/78   Pulse: 81 80  77  Resp: 19 20  18   Temp:      TempSrc:      SpO2: 97%     Weight:      Height:        Intake/Output Summary (Last 24 hours) at 08/12/2023 1332 Last data filed at 08/12/2023 6578 Gross per 24 hour  Intake 4 ml  Output --  Net 4 ml   Filed Weights   08/07/23 0600 08/08/23 0500 08/09/23 0500  Weight: 106.6 kg 106.7 kg 101.1 kg     Examination:  General exam: Alert, in distress Respiratory system: CTA Cardiovascular system: S 1, S 2 RRR Gastrointestinal system: BS present, soft ,nt Central nervous system: awake.  Extremities: no edema  Data Reviewed: I have personally reviewed following labs and imaging studies  CBC: Recent Labs  Lab 08/08/23 1100 08/09/23 1124 08/10/23 0711 08/11/23 0436 08/12/23 0853  WBC 6.2 5.7 4.4 4.1 4.7  NEUTROABS 4.0  --   --   --   --   HGB 8.0* 7.6* 7.8* 7.9* 8.2*  HCT 25.9* 24.7* 25.4* 25.6* 26.5*  MCV 100.4* 99.2 101.2* 100.8* 102.3*  PLT 220 207 198 182 176   Basic Metabolic Panel: Recent Labs  Lab 08/06/23 0504 08/06/23 1544 08/08/23 1100 08/08/23 1822 08/09/23 1124 08/10/23 0711 08/11/23 0436 08/12/23 0853  NA 139  --  138  --  138 140 140 140  K 5.6*   < > 6.0* 5.3* 5.2* 4.7 4.7 4.5  CL 107  --  106  --  107 107 110 111  CO2 23  --  25  --  23 24 24  19*  GLUCOSE 114*  --  119*  --  217* 128* 93 182*  BUN 76*  --  68*  --  60* 54* 45* 37*  CREATININE 1.66*  --  1.62*  --  1.68* 1.66* 1.61* 1.48*  CALCIUM  8.1*  --  8.3*  --  8.2* 8.3* 8.3* 8.4*  MG 3.0*  --  2.9*  --   --   --   --   --   PHOS 5.5*  --   --   --   --   --   --   --    < > = values in this interval not displayed.   GFR: Estimated Creatinine Clearance: 45.7 mL/min (A) (by C-G formula based on SCr of 1.48 mg/dL (H)). Liver Function Tests: No results for input(s): AST, ALT, ALKPHOS, BILITOT, PROT, ALBUMIN  in the last 168 hours.  No results for input(s): LIPASE, AMYLASE in the last 168 hours. No results for input(s): AMMONIA in the last 168 hours. Coagulation Profile: No results for input(s): INR, PROTIME in the last 168 hours. Cardiac Enzymes: No results for input(s): CKTOTAL, CKMB, CKMBINDEX, TROPONINI in the last 168 hours. BNP (last 3 results) No results for input(s): PROBNP in the last 8760 hours. HbA1C: No results for input(s): HGBA1C in the  last 72 hours. CBG: Recent Labs  Lab 08/11/23 2034 08/12/23 0049 08/12/23 0426 08/12/23 0733 08/12/23 1146  GLUCAP 133* 119* 109* 121* 147*   Lipid Profile: No results for input(s): CHOL, HDL, LDLCALC, TRIG, CHOLHDL, LDLDIRECT in the last 72 hours. Thyroid  Function Tests: No results for input(s): TSH, T4TOTAL, FREET4, T3FREE, THYROIDAB in the last 72 hours. Anemia Panel: No results for input(s): VITAMINB12, FOLATE, FERRITIN, TIBC, IRON, RETICCTPCT in the last 72 hours.  Sepsis Labs: No results for input(s): PROCALCITON, LATICACIDVEN in the last 168 hours.  No results  found for this or any previous visit (from the past 240 hours).       Radiology Studies: DG Abd 1 View Result Date: 08/11/2023 CLINICAL DATA:  Abdominal pain EXAM: ABDOMEN - 1 VIEW COMPARISON:  None Available. FINDINGS: The bowel gas pattern is normal. No radio-opaque calculi or other significant radiographic abnormality are seen. IMPRESSION: Negative. No obstruction residual contrast throughout the distal colon Electronically Signed   By: [name] M.D.   On: 08/11/2023 11:00   DG CHEST PORT 1 VIEW Result Date: 08/11/2023 CLINICAL DATA:  Wheezing EXAM: PORTABLE CHEST 1 VIEW COMPARISON:  July 31, 2023 FINDINGS: Improving pulmonary infiltrates with residual left lower lobe retrocardiac atelectasis without consolidations or pleural effusions Heart normal size IMPRESSION: Improving pulmonary infiltrates with residual left lower lobe retrocardiac atelectasis. Electronically Signed   By: [name] M.D.   On: 08/11/2023 10:59        Scheduled Meds:  acetaminophen   650 mg Oral Q6H   artificial tears   Both Eyes Q6H   atorvastatin   10 mg Oral QHS   bacitracin    Topical Daily   budesonide (PULMICORT) nebulizer solution  0.25 mg Nebulization BID   carbamazepine   200 mg Oral TID   carbidopa -levodopa   1 tablet Oral TID   Chlorhexidine  Gluconate Cloth  6 each Topical Daily    diphenhydrAMINE   50 mg Oral QHS   docusate  100 mg Oral BID   doxazosin   4 mg Oral Daily   enoxaparin  (LOVENOX ) injection  40 mg Subcutaneous Q24H   famotidine   20 mg Oral Daily   feeding supplement  1 Container Oral TID BM   ferrous sulfate  325 mg Oral Q breakfast   folic acid  1 mg Oral Daily   ipratropium-albuterol   3 mL Nebulization Q6H   melatonin  10 mg Oral QHS   metoprolol  tartrate  12.5 mg Oral BID   modafinil   100 mg Oral Daily   montelukast   10 mg Per Tube QHS   olopatadine  1 drop Both Eyes BID   mouth rinse  15 mL Mouth Rinse 4 times per day   pantoprazole  (PROTONIX ) IV  40 mg Intravenous Q12H   polyethylene glycol  17 g Oral Daily   sennosides  5 mL Oral QHS   Continuous Infusions:     LOS: 21 days    Time spent: 35 minutes.    Danette Duos, MD [facility]   If 7PM-7AM, please contact night-coverage [email or web address]  08/12/2023, 1:32 PM

## 2023-08-12 NOTE — Progress Notes (Signed)
 Occupational Therapy Treatment Patient Details Name: Jack White MRN: [id number] DOB: 05-04-1940 Today's Date: 08/12/2023   History of present illness Patient is an 83 y.o. male who presented to the ED via EMS 07/22/2023 as a code stroke after becoming unresponsive at home. He was intubated upon arrival. CT head showed large right holohemispheric acute SDH. Neurosurgery was consulted and patient underwent a  right craniotomy on 07/22/23. 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   OT comments  Returned during meal to assess self feeding in order to optimize independence.  Pt much more alert during session, pt still limited by confusion, attention, initiation and sequencing.  Pt able to self feed with spoon several bites using R hand using normal spoon and no difficulties noted; reaches for cup with L hand, intermittent cueing to support with R hand due to weakness as well as cueing to fully attend and place cup back on table-- Provided cup with lid to reduce spillage with drinking.  Educated daugther to decrease distractions on table, encourage pt to self feed several bites and then assist as needed to progressively build up tolerance.  Pt continues to need full supervision during meals for safety per SLP recommendations.   Will follow acutely.       If plan is discharge home, recommend the following:  Other (comment) (total care)   Equipment Recommendations  Wheelchair (measurements OT);Wheelchair cushion (measurements OT);BSC/3in1;Hoyer lift;Hospital bed    Recommendations for Other Services      Precautions / Restrictions Precautions Precautions: Fall Recall of Precautions/Restrictions: Impaired Restrictions Weight Bearing Restrictions Per Provider Order: No       Mobility Bed Mobility Overal bed mobility: Needs Assistance             General bed mobility comments: total assist +2 to reposition in bed    Transfers                          Balance                                           ADL either performed or assessed with clinical judgement   ADL Overall ADL's : Needs assistance/impaired Eating/Feeding: Supervision/ safety;Sitting Eating/Feeding Details (indicate cue type and reason): upright in bed, pt able to self feed with spoon several bites using R hand.  Reaches for cup with L hand, intermittent cueing to support with R hand due to weakness.   Limited by engagement, intiation and attention to task.  Educated daugther to decrease distractions on table, encourage pt to self feed several bites and then assist as needed to progressively build up tolerance.  Provided cup with lid to reduce spillage with drinking.                                   General ADL Comments: total assist, pt would not attend/was not alert to engage in ADLs. Resisting movement when hand over hand attempted.    Extremity/Trunk Assessment Upper Extremity Assessment Upper Extremity Assessment: RUE deficits/detail;LUE deficits/detail RUE Deficits / Details: pt moving UE purposefully during self feeding LUE Deficits / Details: pt actively reaching for and grasping drinks with L hand, bringing to mouth with increased time. LUE Coordination: decreased fine motor;decreased gross motor  Vision       Perception     Praxis     Communication Communication Communication: Impaired Factors Affecting Communication: Hearing impaired;Reduced clarity of speech   Cognition Arousal: Alert Behavior During Therapy: Flat affect Cognition: Cognition impaired Difficult to assess due to: Level of arousal   Awareness: Intellectual awareness impaired, Online awareness impaired Memory impairment (select all impairments): Short-term memory, Working memory, Non-declarative long-term memory, Geneticist, molecular long-term memory Attention impairment (select first level of impairment): Sustained  attention Executive functioning impairment (select all impairments): Initiation, Organization, Sequencing, Reasoning, Problem solving OT - Cognition Comments: pt more alert, does tend to loose focus quickly.  Requires frequent cueing to sustain attention and complete simple task. Pt verbalizing more, tends to tell stories without context.                 Following commands: Impaired Following commands impaired: Follows one step commands inconsistently, Follows one step commands with increased time      Cueing   Cueing Techniques: Verbal cues, Tactile cues, Visual cues  Exercises      Shoulder Instructions       General Comments      Pertinent Vitals/ Pain       Pain Assessment Pain Assessment: Faces Faces Pain Scale: No hurt Pain Intervention(s): Monitored during session  Home Living                                          Prior Functioning/Environment              Frequency  Min 2X/week        Progress Toward Goals  OT Goals(current goals can now be found in the care plan section)  Progress towards OT goals: Progressing toward goals  Acute Rehab OT Goals Patient Stated Goal: none stated OT Goal Formulation: Patient unable to participate in goal setting Time For Goal Achievement: 08/20/23 Potential to Achieve Goals: Fair  Plan      Co-evaluation                 AM-PAC OT 6 Clicks Daily Activity     Outcome Measure   Help from another person eating meals?: A Little Help from another person taking care of personal grooming?: A Lot Help from another person toileting, which includes using toliet, bedpan, or urinal?: Total Help from another person bathing (including washing, rinsing, drying)?: A Lot Help from another person to put on and taking off regular upper body clothing?: Total Help from another person to put on and taking off regular lower body clothing?: Total 6 Click Score: 10    End of Session    OT Visit  Diagnosis: Unsteadiness on feet (R26.81);Muscle weakness (generalized) (M62.81);History of falling (Z91.81);Other symptoms and signs involving cognitive function   Activity Tolerance Patient tolerated treatment well   Patient Left in bed;with call bell/phone within reach;with bed alarm set;with family/visitor present   Nurse Communication Mobility status        Time: 1610-9604 OT Time Calculation (min): 28 min  Charges: OT General Charges $OT Visit: 1 Visit OT Treatments $Self Care/Home Management : 23-37 mins $Therapeutic Exercise: 8-22 mins  Bary Boss, OT Acute Rehabilitation Services Office [phone] Secure Chat Preferred    [name] 08/12/2023, 1:44 PM

## 2023-08-12 NOTE — [place] FL2 (Signed)
 [place]  MEDICAID FL2 LEVEL OF CARE FORM     IDENTIFICATION  Patient Name: Jack White Birthdate: [birth year] Sex: male Admission Date (Current Location): 07/21/2023  [facility] and [place] Number:  Chiropodist and Address:  [facility], [address], [place], [place] [address]      Provider Number: [id number]  Attending Physician Name and Address:  Danette Duos, MD  Relative Name and Phone Number:       Current Level of Care: Hospital Recommended Level of Care: Skilled Nursing Facility Prior Approval Number:    Date Approved/Denied:   PASRR Number: [id number]  Discharge Plan: SNF    Current Diagnoses: Patient Active Problem List   Diagnosis Date Noted   Malnutrition of moderate degree 07/23/2023   Status post surgery 07/22/2023   Subdural hematoma (HCC) 07/22/2023   Acute gastroenteritis 04/07/2023   Intractable nausea and vomiting 04/07/2023   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   S/P colostomy (HCC)    Acute cholecystitis 10/14/2014   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   Colon perforation (HCC) 10/14/2014   H/O colostomy 10/14/2014   Diabetes mellitus, type 2 (HCC) 10/14/2014   BP (high blood pressure) 10/14/2014   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   [name]'s neuralgia 10/14/2014   Gangrenous cholecystitis 06/27/2014   Chronic kidney disease (CKD), stage III (moderate) (HCC) 02/15/2014   Fungal infection of toenail 07/30/2013    Orientation RESPIRATION BLADDER Height & Weight     Self  Normal Incontinent Weight: 222 lb 14.2 oz (101.1 kg) Height:  6' (182.9 cm)  BEHAVIORAL SYMPTOMS/MOOD NEUROLOGICAL BOWEL NUTRITION STATUS      Incontinent Diet (see DC summary)  AMBULATORY STATUS COMMUNICATION OF NEEDS Skin   Extensive Assist Verbally Surgical wounds, Other (Comment) (closed head incision, staples; non-pressure injury left heel: gauze dressing, change daily)                        Personal Care Assistance Level of Assistance  Bathing, Feeding, Dressing Bathing Assistance: Maximum assistance Feeding assistance: Maximum assistance Dressing Assistance: Maximum assistance     Functional Limitations Info  Speech, Hearing   Hearing Info: Impaired Speech Info: Impaired (dysarthria)    SPECIAL CARE FACTORS FREQUENCY  PT (By licensed PT), OT (By licensed OT)     PT Frequency: 5x/wk OT Frequency: 5x/wk            Contractures Contractures Info: Not present    Additional Factors Info  Code Status, Allergies, Psychotropic Code Status Info: DNR Allergies Info: Dilaudid (Hydromorphone Hcl) Psychotropic Info: Sinemet  IR 1 tab 3x/day         Current Medications (08/12/2023):  This is the current hospital active medication list Current Facility-Administered Medications  Medication Dose Route Frequency Provider Last Rate Last Admin   acetaminophen  (TYLENOL ) tablet 650 mg  650 mg Oral Q6H Regalado, Belkys A, MD   650 mg at 08/12/23 0823   albuterol  (PROVENTIL ) (2.5 MG/3ML) 0.083% nebulizer solution 2.5 mg  2.5 mg Nebulization Q3H PRN Babcock, Peter E, NP   2.5 mg at 08/05/23 1311   alum & mag hydroxide-simeth (MAALOX/MYLANTA) 200-200-20 MG/5ML suspension 30 mL  30 mL Oral Q6H PRN Regalado, Belkys A, MD   30 mL at 08/11/23 0957   artificial tears (LACRILUBE) ophthalmic ointment   Both Eyes Q6H Regalado, Belkys A, MD   Given at 08/12/23 0501   atorvastatin  (LIPITOR) tablet 10 mg  10 mg Oral QHS Regalado, Belkys A, MD   10 mg at 08/11/23 2108   bacitracin  ointment   Topical Daily Augusto Blonder, MD   31.5 Application at 08/12/23 0845   budesonide (PULMICORT) nebulizer solution 0.25 mg  0.25 mg Nebulization BID Regalado, Belkys A, MD   0.25 mg at 08/12/23 0748   carbamazepine  (TEGRETOL ) tablet 200 mg  200 mg Oral TID Regalado, Belkys A, MD   200 mg at 08/12/23 1610   carbidopa -levodopa  (SINEMET  IR) 25-100 MG per tablet immediate release 1  tablet  1 tablet Oral TID Regalado, Belkys A, MD   1 tablet at 08/12/23 9604   Chlorhexidine  Gluconate Cloth 2 % PADS 6 each  6 each Topical Daily Augusto Blonder, MD   6 each at 08/11/23 0957   diphenhydrAMINE  (BENADRYL ) capsule 50 mg  50 mg Oral QHS Regalado, Belkys A, MD   50 mg at 08/11/23 2109   docusate (COLACE) 50 MG/5ML liquid 100 mg  100 mg Oral BID Regalado, Belkys A, MD   100 mg at 08/12/23 0824   doxazosin  (CARDURA ) tablet 4 mg  4 mg Oral Daily Regalado, Belkys A, MD   4 mg at 08/12/23 5409   enoxaparin  (LOVENOX ) injection 40 mg  40 mg Subcutaneous Q24H Tomlinson, Sara Caylin, PA-C   40 mg at 08/11/23 1509   famotidine  (PEPCID ) tablet 20 mg  20 mg Oral Daily Regalado, Belkys A, MD   20 mg at 08/12/23 8119   feeding supplement (BOOST / RESOURCE BREEZE) liquid 1 Container  1 Container Oral TID BM Regalado, Belkys A, MD   1 Container at 08/12/23 0840   ferrous sulfate tablet 325 mg  325 mg Oral Q breakfast Regalado, Belkys A, MD   325 mg at 08/12/23 0824   folic acid (FOLVITE) tablet 1 mg  1 mg Oral Daily Regalado, Belkys A, MD   1 mg at 08/12/23 0823   ipratropium-albuterol  (DUONEB) 0.5-2.5 (3) MG/3ML nebulizer solution 3 mL  3 mL Nebulization Q6H Regalado, Belkys A, MD   3 mL at 08/12/23 0748   melatonin tablet 10 mg  10 mg Oral QHS [place], Eric J, DO   10 mg at 08/11/23 2109   metoprolol  tartrate (LOPRESSOR ) tablet 12.5 mg  12.5 mg Oral BID Regalado, Belkys A, MD   12.5 mg at 08/12/23 0823   modafinil  (PROVIGIL ) tablet 100 mg  100 mg Oral Daily Regalado, Belkys A, MD   100 mg at 08/12/23 0824   montelukast  (SINGULAIR ) tablet 10 mg  10 mg Per Tube QHS Lorre Rosin, Abigail, NP   10 mg at 08/11/23 2109   olopatadine (PATANOL) 0.1 % ophthalmic solution 1 drop  1 drop Both Eyes BID Regalado, Belkys A, MD   1 drop at 08/12/23 0844   ondansetron  (ZOFRAN ) tablet 4 mg  4 mg Oral Q4H PRN Regalado, Belkys A, MD   4 mg at 08/10/23 1214   Or   ondansetron  (ZOFRAN ) injection 4 mg  4 mg Intravenous Q4H  PRN Regalado, Belkys A, MD       Oral care mouth rinse  15 mL Mouth Rinse 4 times per day Audie Bleacher, MD   15 mL at 08/12/23 0845   Oral care mouth rinse  15 mL Mouth Rinse PRN Cabbell, Kyle, MD       pantoprazole  (PROTONIX ) injection 40 mg  40 mg Intravenous Q12H Regalado, Belkys A, MD   40 mg at 08/12/23 0823   polyethylene glycol (MIRALAX  / GLYCOLAX ) packet 17 g  17 g Oral Daily Regalado, Belkys A, MD   17 g at 08/12/23 0824   sennosides (SENOKOT) 8.8 MG/5ML syrup 5 mL  5 mL Oral QHS Regalado, Belkys A, MD   5 mL at 08/11/23 2108   simethicone  (MYLICON) chewable tablet 80 mg  80 mg Oral QID PRN Regalado, Belkys A, MD   80 mg at 08/11/23 0957   traMADol  (ULTRAM ) tablet 50 mg  50 mg Oral Q8H PRN Regalado, Belkys A, MD         Discharge Medications: Please see discharge summary for a list of discharge medications.  Relevant Imaging Results:  Relevant Lab Results:   Additional Information SS#: [id number]  Tandy Fam, LCSW

## 2023-08-12 NOTE — TOC Progression Note (Signed)
 Transition of Care [facility]) - Progression Note    Patient Details  Name: CORBIN FALCK MRN: [id number] Date of Birth: [birth year]  [facility] CM/SW Contact  Tandy Fam, [place] Phone Number: 08/12/2023, 4:27 PM  Clinical Narrative:   CSW met with patient's daughter, Genevia Kern, at bedside to discuss SNF options. Daughter had previously preferred [facility], but has now changed her mind and wants [facility] as first choice due to the closeness for family to visit more often. CSW confirmed with [facility] that they would have a bed available for the patient. CSW contacted [employer] to request insurance authorization for SNF and PTAR, awaiting response. CSW to follow.    Expected Discharge Plan: Skilled Nursing Facility Barriers to Discharge: Continued Medical Work up, English as a second language teacher  Expected Discharge Plan and Services In-house Referral: Clinical Social Work Discharge Planning Services: [employer] Consult Post Acute Care Choice: Skilled Nursing Facility Living arrangements for the past 2 months: Mobile Home                                       Social Determinants of Health (SDOH) Interventions SDOH Screenings   Food Insecurity: No Food Insecurity (07/22/2023)  Housing: Low Risk  (07/22/2023)  Transportation Needs: No Transportation Needs (07/22/2023)  Utilities: Not At Risk (07/22/2023)  Financial Resource Strain: Patient Declined (02/07/2023)   Received from [facility]  Social Connections: Moderately Integrated (07/24/2023)  Tobacco Use: Low Risk  (07/22/2023)  Recent Concern: Tobacco Use - Medium Risk (07/17/2023)   Received from [facility]    Readmission Risk Interventions     No data to display

## 2023-08-12 NOTE — Progress Notes (Signed)
 Occupational Therapy Treatment Patient Details Name: Jack White MRN: [id number] DOB: [birth year] Today's Date: 08/12/2023   History of present illness Patient is an 83 y.o. male who presented to the ED via EMS 07/22/2023 as a code stroke after becoming unresponsive at home. He was intubated upon arrival. CT head showed large right holohemispheric acute SDH. Neurosurgery was consulted and patient underwent a  right craniotomy on 07/22/23. 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   OT comments  Patient supine in bed upon entry, remains lethargic limiting participation in OT session.  Requires +2 total assist for repositioning in bed, cueing for pt to utilize R UE to assist with pulling up in bed but requires constant cueing to maintain hold on overhead rail and ultimately pt does not assist.  Utilized multiple modes of stimulation to increase arousal but pt unable to sustain eyes open or engage for more than a few seconds.  Pt purposefully moving R UE, but not to command to push or pull (does eventually squeeze his hand); PROM WFL to L UE with increased time and effort to mobilize.  Resists hand over hand for ADL engagement.  RN and daughter report increased alertness this morning.  Will attempt to return for meal in order to assess alertness, and hopefully optimize independence with self feeding.       If plan is discharge home, recommend the following:  Other (comment) (total care)   Equipment Recommendations  Wheelchair (measurements OT);Wheelchair cushion (measurements OT);BSC/3in1;Hoyer lift;Hospital bed    Recommendations for Other Services      Precautions / Restrictions Precautions Precautions: Fall Recall of Precautions/Restrictions: Impaired Restrictions Weight Bearing Restrictions Per Provider Order: No       Mobility Bed Mobility Overal bed mobility: Needs Assistance             General bed mobility comments: total assist +2  to reposition in bed    Transfers                         Balance                                           ADL either performed or assessed with clinical judgement   ADL                                         General ADL Comments: total assist, pt would not attend/was not alert to engage in ADLs. Resisting movement when hand over hand attempted.    Extremity/Trunk Assessment Upper Extremity Assessment Upper Extremity Assessment: RUE deficits/detail;LUE deficits/detail RUE Deficits / Details: difficult to assess, pt moving UE purposefully but not to command LUE Deficits / Details: resistive to movement initally, but able to passively range [facility]. LUE Coordination: decreased fine motor;decreased gross Systems analyst Communication Communication: Impaired Factors Affecting Communication: Hearing impaired;Reduced clarity of speech   Cognition Arousal: Obtunded Behavior During Therapy: Flat affect Cognition: Cognition impaired Difficult to assess due to: Level of arousal           OT - Cognition Comments: Daughter and  RN report patient was very alert this morning, but once OT arrived pt having a difficult time maintaining alertness.  He would open his eyes briefly but is not sustaining or following commands consistently.  Likely lethargic from busy morning and will re-assess next session.                 Following commands: Impaired Following commands impaired: Follows one step commands inconsistently      Cueing   Cueing Techniques: Verbal cues, Tactile cues, Visual cues  Exercises      Shoulder Instructions       General Comments      Pertinent Vitals/ Pain       Pain Assessment Pain Assessment: Faces Faces Pain Scale: No hurt Pain Intervention(s): Monitored during session  Home Living                                           Prior Functioning/Environment              Frequency  Min 2X/week        Progress Toward Goals  OT Goals(current goals can now be found in the care plan section)  Progress towards OT goals: Not progressing toward goals - comment (OT to assess next session)  Acute Rehab OT Goals Patient Stated Goal: none stated OT Goal Formulation: Patient unable to participate in goal setting Time For Goal Achievement: 08/20/23 Potential to Achieve Goals: Fair  Plan      Co-evaluation                 AM-PAC OT 6 Clicks Daily Activity     Outcome Measure   Help from another person eating meals?: Total Help from another person taking care of personal grooming?: Total Help from another person toileting, which includes using toliet, bedpan, or urinal?: Total Help from another person bathing (including washing, rinsing, drying)?: Total Help from another person to put on and taking off regular upper body clothing?: Total Help from another person to put on and taking off regular lower body clothing?: Total 6 Click Score: 6    End of Session    OT Visit Diagnosis: Unsteadiness on feet (R26.81);Muscle weakness (generalized) (M62.81);History of falling (Z91.81);Other symptoms and signs involving cognitive function   Activity Tolerance Patient limited by fatigue;Patient limited by lethargy   Patient Left in bed;with call bell/phone within reach;with bed alarm set;with family/visitor present   Nurse Communication Mobility status;Other (comment) (lethargy)        Time: 1478-2956 OT Time Calculation (min): 18 min  Charges: OT General Charges $OT Visit: 1 Visit OT Treatments $Therapeutic Exercise: 8-22 mins  Bary Boss, OT Acute Rehabilitation Services Office [phone] Secure Chat Preferred    Jack White 08/12/2023, 11:34 AM

## 2023-08-12 NOTE — Progress Notes (Signed)
 Nutrition Follow-up  DOCUMENTATION CODES:  Non-severe (moderate) malnutrition in context of chronic illness  INTERVENTION:  Continue current diet as ordered per SLP Advance as tolerated Feeding assistance Boost Plus TID to provide 360kcal and 14g of protein per carton Magic cup BID with lunch and dinner, each supplement provides 290 kcal and 9 grams of protein PM snack  NUTRITION DIAGNOSIS:  Moderate Malnutrition related to chronic illness as evidenced by moderate fat depletion, moderate muscle depletion.  - Ongoing  GOAL:  Patient will meet greater than or equal to 90% of their needs  - progressing  MONITOR:  PO intake, Supplement acceptance, Labs  REASON FOR ASSESSMENT:  Consult Enteral/tube feeding initiation and management  ASSESSMENT:  Pt with PMH of HTN, HLD, DM, Parkinson's dz, trigeminal neuralgia, CKD 3, and L heel ulcer seen by podiatry at [facility] s/p debridement 5/22 now admitted 5/26 after a fall with large R acute SDH.   5/27 - s/p R crani for evacuation of SDH 5/28 - s/p cortrak placement; tip gastric 5/30 - transferred out of ICU 6/01 - transferred back to ICU 6/03 - SLP eval, recommend NPO 6/11 - diet advanced to Dysphagia 2 with thin liquids, ate 10% of lunch 6/13 - pt removed his feeding tube  Pt resting in bed at the time of assessment. Daughter at bedside provides hx since last assessment. Pt pulled out his cortrak 6/13 and intake has been stable over the weekend. Pt has to be fed, seems to do better when family is present. Sipping on ensure at the time of assessment. Daughter reports he will likely discharge tomorrow. Agreeable to receiving a bedtime snack. States that he has been somewhat accepting of the magic cup (likes chocolate flavors).  RN reports pt had about 1/2 of his breakfast this AM.  Admission Weight: 105 kg (5/26) Current Weight: 101 kg (6/14)  Average Meal Intake: 6/11-6/17: 43% intake x 3 recorded meals  Nutritionally Relevant  Medications: Scheduled Meds:  atorvastatin   10 mg Oral QHS   bacitracin    Topical Daily   diphenhydrAMINE   50 mg Oral QHS   docusate  100 mg Oral BID   doxazosin   4 mg Oral Daily   famotidine   20 mg Oral Daily   feeding supplement  1 Container Oral TID BM   ferrous sulfate  325 mg Oral Q breakfast   folic acid  1 mg Oral Daily   montelukast   10 mg Per Tube QHS   pantoprazole  IV  40 mg Intravenous Q12H   polyethylene glycol  17 g Oral Daily   sennosides  5 mL Oral QHS   PRN Meds: alum & mag hydroxide-simeth, ondansetron , simethicone , traMADol   Labs Reviewed: BUN 37, creatinine 1.48 CBG ranges from 99-169 mg/dL over the last 24 hours HgbA1c 6.5%  Diet Order:   Diet Order             DIET DYS 2 Room service appropriate? Yes; Fluid consistency: Thin  Diet effective now                  EDUCATION NEEDS:   Not appropriate for education at this time  Skin:  Skin Assessment: Skin Integrity Issues: Skin Integrity Issues:: Other (Comment) Other: L heel wound followed by [facility]  Last BM:  6/16  Height:  Ht Readings from Last 1 Encounters:  07/21/23 6' (1.829 m)   Weight:  Wt Readings from Last 1 Encounters:  08/09/23 101.1 kg   BMI:  Body mass index is 30.23 kg/m.  Estimated Nutritional Needs:  Kcal:  1900-2100 Protein:  110-125 grams Fluid:  >1.9 L/day    Edwena Graham, RD, LDN, CNSC Registered Dietitian II Please reach out via secure chat

## 2023-08-13 DIAGNOSIS — N400 Enlarged prostate without lower urinary tract symptoms: Secondary | ICD-10-CM

## 2023-08-13 DIAGNOSIS — J690 Pneumonitis due to inhalation of food and vomit: Secondary | ICD-10-CM

## 2023-08-13 DIAGNOSIS — N179 Acute kidney failure, unspecified: Secondary | ICD-10-CM | POA: Insufficient documentation

## 2023-08-13 DIAGNOSIS — E875 Hyperkalemia: Secondary | ICD-10-CM | POA: Insufficient documentation

## 2023-08-13 DIAGNOSIS — E785 Hyperlipidemia, unspecified: Secondary | ICD-10-CM

## 2023-08-13 DIAGNOSIS — S065XAA Traumatic subdural hemorrhage with loss of consciousness status unknown, initial encounter: Secondary | ICD-10-CM | POA: Diagnosis not present

## 2023-08-13 DIAGNOSIS — G20A1 Parkinson's disease without dyskinesia, without mention of fluctuations: Secondary | ICD-10-CM

## 2023-08-13 DIAGNOSIS — D649 Anemia, unspecified: Secondary | ICD-10-CM

## 2023-08-13 DIAGNOSIS — G4700 Insomnia, unspecified: Secondary | ICD-10-CM

## 2023-08-13 DIAGNOSIS — Z9889 Other specified postprocedural states: Secondary | ICD-10-CM | POA: Diagnosis not present

## 2023-08-13 DIAGNOSIS — G500 Trigeminal neuralgia: Secondary | ICD-10-CM

## 2023-08-13 LAB — GLUCOSE, CAPILLARY
Glucose-Capillary: 109 mg/dL — ABNORMAL HIGH (ref 70–99)
Glucose-Capillary: 239 mg/dL — ABNORMAL HIGH (ref 70–99)
Glucose-Capillary: 115 mg/dL — ABNORMAL HIGH (ref 70–99)

## 2023-08-13 MED ORDER — CARBAMAZEPINE 200 MG PO TABS: 200.0000 mg | ORAL_TABLET | Freq: Three times a day (TID) | ORAL | Status: AC

## 2023-08-13 MED ORDER — TRAMADOL HCL 50 MG PO TABS: 50.0000 mg | ORAL_TABLET | Freq: Three times a day (TID) | ORAL | 0 refills | Status: DC | PRN

## 2023-08-13 MED ORDER — FOLIC ACID 1 MG PO TABS: 1.0000 mg | ORAL_TABLET | Freq: Every day | ORAL | Status: AC

## 2023-08-13 MED ORDER — DIPHENHYDRAMINE HCL 25 MG PO TABS: 50.0000 mg | ORAL_TABLET | Freq: Every day | ORAL | Status: AC

## 2023-08-13 MED ORDER — POLYETHYLENE GLYCOL 3350 17 G PO PACK: 17.0000 g | PACK | Freq: Every day | ORAL | Status: AC | PRN

## 2023-08-13 MED ORDER — SENNOSIDES 8.8 MG/5ML PO SYRP
5.0000 mL | ORAL_SOLUTION | Freq: Every day | ORAL | Status: AC
Start: 2023-08-13 — End: ?

## 2023-08-13 MED ORDER — CLONAZEPAM 0.5 MG PO TABS: 0.2500 mg | ORAL_TABLET | Freq: Every day | ORAL | 0 refills | Status: DC | PRN

## 2023-08-13 MED ORDER — HYDROCORTISONE 1 % EX CREA: 1.0000 | TOPICAL_CREAM | Freq: Three times a day (TID) | CUTANEOUS | Status: AC | PRN

## 2023-08-13 MED ORDER — MODAFINIL 100 MG PO TABS: 100.0000 mg | ORAL_TABLET | Freq: Every day | ORAL | 0 refills | Status: DC

## 2023-08-13 MED ORDER — MODAFINIL 100 MG PO TABS: 100.0000 mg | ORAL_TABLET | Freq: Every day | ORAL | 0 refills | Status: AC

## 2023-08-13 MED ORDER — FERROUS SULFATE 325 (65 FE) MG PO TABS: 325.0000 mg | ORAL_TABLET | Freq: Every day | ORAL | Status: AC

## 2023-08-13 MED ORDER — HYPROMELLOSE (GONIOSCOPIC) 2.5 % OP SOLN: 2.0000 [drp] | Freq: Four times a day (QID) | OPHTHALMIC | Status: AC | PRN

## 2023-08-13 MED ORDER — BACITRACIN ZINC 500 UNIT/GM EX OINT: TOPICAL_OINTMENT | Freq: Every day | CUTANEOUS | Status: DC

## 2023-08-13 MED ORDER — MELATONIN 10 MG PO TABS: 10.0000 mg | ORAL_TABLET | Freq: Every day | ORAL | Status: AC

## 2023-08-13 MED ORDER — ATORVASTATIN CALCIUM 10 MG PO TABS: 10.0000 mg | ORAL_TABLET | Freq: Every day | ORAL | Status: DC

## 2023-08-13 MED ORDER — METOPROLOL TARTRATE 25 MG PO TABS: 12.5000 mg | ORAL_TABLET | Freq: Two times a day (BID) | ORAL | Status: DC

## 2023-08-13 MED ORDER — PANTOPRAZOLE SODIUM 40 MG PO TBEC
40.0000 mg | DELAYED_RELEASE_TABLET | Freq: Two times a day (BID) | ORAL | Status: DC
  Administered 2023-08-13 – 2023-08-14 (×3): 40 mg via ORAL
  Filled 2023-08-13 (×3): qty 1

## 2023-08-13 NOTE — Progress Notes (Signed)
 Speech Language Pathology Treatment: Cognitive-Linguistic  Patient Details Name: Jack White MRN: [id number] DOB: 09-Nov-1940 Today's Date: 08/13/2023 Time: 4098-1191 SLP Time Calculation (min) (ACUTE ONLY): 13 min  Assessment / Plan / Recommendation Clinical Impression  Patient seen by SLP for skilled treatment focused on cognitive function goals. Patient was awake, alert, sitting up in recliner chair. He was somewhat verbally agitated but not exhibiting any unsafe behaviors. He told SLP that he wanted to get out of here and go home. When SLP explained the plan to go to SNF for rehab, he did demonstrate some awareness, telling SLP, ,they said I had a fall and need surgery on my brain but he was not aware that he had already had this surgery. When asked if he wanted anything to eat or drink he said all he wanted was some ice. SLP brought this to him and although he was able to hold cup and get ice out, he would frequently tip cup over without awareness. SLP was able to redirect him but this was brief and he would return to asking about leaving. SLP will continue to follow.    HPI HPI: 78 yoM with PMH as below, significant for HTN, HLD, DMT2, possible parkinsons disease, trigeminal neuralgia, CKD 3a, and BPH, who presented after mechanical fall on 5/27, not on AC or antiplatelets who developed N/V, AMS, with left sided weakness found to have large right SDH with 1.22m right to left MLS.  Admitted to NSGY and taken for emergent right craniotomy.  Was seen by PCCM while in Neuro ICU, extubated 5/27 evening.  ICU stay complicated by hypoactive delirium, cortrak placed for meds/ nutrition.  pt transferred out of ICU 5/30, CTH unchanged 5/31.  Started to develop increased fever curve.  On 6/1 PCCM re-consulted for development on hypoxia, previously on room air with upper airway congestion.  Daughter reports has not been coughing and clearing secretion.      SLP Plan  Continue with current plan of  care          Recommendations   SLP at next venue of care                       Frequent or constant Supervision/Assistance Cognitive communication deficit (R41.841)     Continue with current plan of care    Jacqualine Mater, MA, CCC-SLP Speech Therapy

## 2023-08-13 NOTE — Hospital Course (Signed)
 83 year old gentleman with past medical history significant for hypertension, hyperlipidemia, diabetes, CKD stage IIIa, possible Parkinson disease, trigeminal neuralgia, BPH who presents to7/2025 from home via EMS after mechanical fall with associated headache, left-sided weakness, nausea vomiting.  Code stroke was initiated.  No anticoagulation or antiplatelets on outpatient.  Workup in the ED was notable for a large right subdural hematoma with 1.6 mm right-to-left midline shift.  Neurosurgery was consulted and taken for emergent right craniotomy, admitted to the neuro ICU under the neurosurgical service with PCCM consulted.   Significant hospital events: 5/27: Admit by neurosurgery for SDH s/p craniotomy, PCCM consulted; extubated. 5/28: Intermittent agitation/confusion, difficulty sleeping 5/29: Restlessness overnight, sleeping poorly on a.m. assessment 5/30: Repeat CT head stable, transferred out of ICU; Eval by CIR>does not meet level of care 6/1: CCM reconsulted for hypoxia, CT angiogram chest negative for PE, small bilateral pleural effusions, multifocal groundglass opacities bilateral upper lobes, right middle lobe concerning for infectious process. 6/2: re-eval by CIR> continues to not meet level of care and signed off 6/3: Encephalopathy improved, following commands for RN (thumbs up, wiggling toes, stating name)Fatigues quickly, but more awake than prior  6/4: Hemoglobin down to 6.9, 1 unit PRBC ordered 6/5: Creatinine rising, sodium improved with free water , IVC on POCUS collapsible with marked respiratory variation, IV fluids given; concern for possible aspiration event following swallow evaluation. 6/6: Creatinine improved, stopped IV fluids; mental status waxing/waning; started on Provigil  6/9: Transferred to [facility]; pain overnight, given dose of morphine  by PCCM coverage; awaiting MBS by SLP 6/10: MBS completed, started on clear liquid diet. 6/11: Advanced to dysphagia 2 diet.  Staples  removed by neurosurgery.  Transitioned to nocturnal feeds only. 6/12.  Transferred to med telemetry floor. 6/17: Mental status fluctuates, he has days he is more alert, others he is more sleepy.    Traumatic subdural hematoma with midline shift s/p craniotomy - Patient presents after mechanical fall with associated left-sided weakness, nausea vomiting and confusion. - CT head showed large right subdural hematoma with 1.6 mm right-to-left midline shift. - Patient was taken emergently for craniotomy by neurosurgery.  - Repeat CT head 5/28 with decreased right subdural hematoma mass effect and left midline shift.  Right subdural drain in place. -CT head 5/31: residual subdural hematoma 1.5 cm in thickness with mass effect right cerebral hemisphere 5 to 6 mm right-to-left shift, smaller left-sided SDH without mass effect, not significant change from prior.  -Staples removed by neurosurgery 08/07/2023 - Patient will need skilled nursing facility for rehab -Patient has been more alert, and answering some questions. MS fluctuates, he has days he is more sleepy.  -CT head 6/15 looks stable.    Abdominal Pain: Resolved 6-16:Patient is complaining of abdominal pain, tender supra pubic area.   Start IV PPI No significant Bladder retention.  UA negative for infection.  Pain resolved with gas x, decision was made to defer CT abdomen.    BL wheezing - resolved  - s/p nebs -Chest x ray: Improving pulmonary infiltrates with residual left lower lobe retrocardiac atelectasis.   Hyperkalemia - resolved  - Received insulin  and dextrose . Status post Lokelma  . -In setting AKI.    Acute renal failure on CKD stage IIIA Patient baseline creatinine 1.3--- 1.5 - Creatinine peak 2.7---1.6 Renal function stable   Hypoactive delirium -On Provigil  by PCCM - Delirium  precautions   Aspiration pneumonia - resolved  -6/01: Patient became hypoxic.  -CTA chest: negative for PE but with small bilateral pleural  effusions and multifocal  groundglass opacities bilateral upper lobes, right middle lobe concerning for infectious process, likely aspiration.  -Completed 5 days of Unasyn  on 6/06   Moderate Malnutrition;  He removed Tube feeding.  Discussed with family, plan to see how he does over weekend. He ate 100% breakfast, he needs to be fed.    Anemia Received 1 unit of packed red blood cells  Anemia panel. T sat low, started  Iron supplement and  folic acid.    Hypertension - Resume low dose metoprolol    Hyperlipidemia -on lipitor   DM2 Hemoglobin A1c 6.5%. -- continue metformin     Parkinson's disease -- Sinemet  1 tablet 3 times daily  Daughter report, that Dr Tat, was questioning diagnosis for Parkinson. She was asking if his medications can be reduce to help prevent sleepiness.  -Neurology consulted. Dat Scan are not done inpatient.  -neurology recommend to continue current dose of sinemet .  - outpatient follow up with Dr. Winferd Hatter   Trigeminal neuralgia Follow-up with neurology outpatient, Dr. Winferd Hatter; last seen 07/16/2023. -- Tegretol  level 5.7 on 07/30/2023 -- Continue Tegretol  200 mg per tube TID   BPH -- Doxazosin  4 mg per tube daily   Insomnia Reports patient takes 10 mg of melatonin and 50 mg of Benadryl  nightly at home. -- Increased  6/09:  melatonin to 10 mg nightly.    having issues at night with sleep. Daughter report he takes 50 mg benadryl  at home, and have been doing it for years.  He was able to sleep better.    Left heel ulcer, POA Sacral/coccyx skin tear Seen by podiatry at [facility], underwent debridement on 5/22. -- Cleanse L heel wound with NS, apply Bacitracin  ointment to wound bed daily, cover with Telfa nonstick dressing and secure with silicone foam or Kerlix roll gauze whichever is preferred.  -- Apply sacral foam dressing, change every 3 days or PRN    Weakness/debility/deconditioning: -- Continue PT/OT efforts -- Will need SNF placement   Nutrition Problem:  Moderate Malnutrition Etiology: chronic illness Signs/Symptoms: moderate fat depletion, moderate muscle depletion  Interventions: Tube feeding, Prostat, MVI   Estimated body mass index is 30.23 kg/m as calculated from the following:   Height as of this encounter: 6' (1.829 m).   Weight as of this encounter: 101.1 kg

## 2023-08-13 NOTE — Progress Notes (Signed)
 Physical Therapy Treatment Patient Details Name: Jack White MRN: [id number] DOB: [birth year] Today's Date: 08/13/2023   History of Present Illness Patient is an 83 y.o. male who presented to the ED via EMS 07/22/2023 as a code stroke after becoming unresponsive at home. He was intubated upon arrival. CT head showed large right holohemispheric acute SDH. Neurosurgery was consulted and patient underwent a  right craniotomy on 07/22/23. 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    PT Comments  Pt alert; reporting he doesn't feel worth a darn, when asked. Pt continues with difficulty with initiation, sequencing, and attention. Pt requiring +2 assist for bed mobility and transfers. Trialed a variety of different methods for sit to stands, including Stedy vs RW vs pulling up on bed rail to promote anterior weight shift. Pt demonstrates heavy retropulsion with increased fear of falling. Otherwise, has good activity tolerance throughout session. Patient will benefit from continued inpatient follow up therapy, <3 hours/day.    If plan is discharge home, recommend the following: Two people to help with walking and/or transfers;Two people to help with bathing/dressing/bathroom   Can travel by private vehicle     No  Equipment Recommendations  Other (comment) (defer)    Recommendations for Other Services       Precautions / Restrictions Precautions Precautions: Fall Recall of Precautions/Restrictions: Impaired Restrictions Weight Bearing Restrictions Per Provider Order: No     Mobility  Bed Mobility Overal bed mobility: Needs Assistance Bed Mobility: Supine to Sit     Supine to sit: Max assist, +2 for physical assistance     General bed mobility comments: Pt moving BLE's to edge of bed minimally, requiring assist to execute and bring trunk to upright. Use of bed pad to scoot hips forward out to edge    Transfers Overall transfer level: Needs  assistance Equipment used: Rolling walker (2 wheels), Ambulation equipment used (Bed rail) Transfers: Sit to/from Stand, Bed to chair/wheelchair/BSC Sit to Stand: Max assist, +2 physical assistance           General transfer comment: [name] to transition from bed to chair. Worked on sit to stands from chair vs pulling up from bed rail to promote anterior weight shift. Pt with heavy retropulsion despite dense multimodal cueing Transfer via Lift Equipment: Stedy  Ambulation/Gait                   Stairs             Wheelchair Mobility     Tilt Bed    Modified Rankin (Stroke Patients Only)       Balance Overall balance assessment: Needs assistance Sitting-balance support: Feet supported Sitting balance-Leahy Scale: Poor Sitting balance - Comments: reliant on single UE support, CGA   Standing balance support: Bilateral upper extremity supported Standing balance-Leahy Scale: Zero                              Communication Communication Communication: Impaired Factors Affecting Communication: Hearing impaired;Reduced clarity of speech  Cognition Arousal: Alert Behavior During Therapy: Flat affect   PT - Cognitive impairments: Memory, Awareness, Attention, Initiation, Sequencing, Problem solving, Safety/Judgement                       PT - Cognition Comments: Continues with decreased initiation and impaired sequencing of motor tasks. Poor command following Following commands: Impaired Following commands impaired: Follows one  step commands inconsistently, Follows one step commands with increased time    Cueing Cueing Techniques: Verbal cues, Tactile cues, Visual cues  Exercises      General Comments        Pertinent Vitals/Pain Pain Assessment Pain Assessment: Faces Faces Pain Scale: No hurt    Home Living                          Prior Function            PT Goals (current goals can now be found in  the care plan section) Acute Rehab PT Goals Patient Stated Goal: pt daughter agreeable to rehab Potential to Achieve Goals: Fair Progress towards PT goals: Progressing toward goals    Frequency    Min 2X/week      PT Plan      Co-evaluation              AM-PAC PT 6 Clicks Mobility   Outcome Measure  Help needed turning from your back to your side while in a flat bed without using bedrails?: A Lot Help needed moving from lying on your back to sitting on the side of a flat bed without using bedrails?: Total Help needed moving to and from a bed to a chair (including a wheelchair)?: Total Help needed standing up from a chair using your arms (e.g., wheelchair or bedside chair)?: Total Help needed to walk in hospital room?: Total Help needed climbing 3-5 steps with a railing? : Total 6 Click Score: 7    End of Session Equipment Utilized During Treatment: Gait belt Activity Tolerance: Patient tolerated treatment well Patient left: in chair;with call bell/phone within reach;with chair alarm set Nurse Communication: Mobility status PT Visit Diagnosis: Unsteadiness on feet (R26.81);History of falling (Z91.81);Difficulty in walking, not elsewhere classified (R26.2);Pain     Time: 1191-4782 PT Time Calculation (min) (ACUTE ONLY): 47 min  Charges:    $Therapeutic Activity: 38-52 mins PT General Charges $$ ACUTE PT VISIT: 1 Visit                     Jack White, PT, DPT Acute Rehabilitation Services Office [phone]    Jack White 08/13/2023, 2:29 PM

## 2023-08-13 NOTE — Plan of Care (Signed)
  Problem: Activity: Goal: Ability to tolerate increased activity will improve Outcome: Progressing   Problem: Respiratory: Goal: Ability to maintain a clear airway and adequate ventilation will improve Outcome: Progressing   Problem: Role Relationship: Goal: Method of communication will improve Outcome: Progressing   Problem: Education: Goal: Knowledge of General Education information will improve Description: Including pain rating scale, medication(s)/side effects and non-pharmacologic comfort measures Outcome: Progressing   Problem: Health Behavior/Discharge Planning: Goal: Ability to manage health-related needs will improve Outcome: Progressing   Problem: Clinical Measurements: Goal: Ability to maintain clinical measurements within normal limits will improve Outcome: Progressing Goal: Will remain free from infection Outcome: Progressing Goal: Diagnostic test results will improve Outcome: Progressing Goal: Respiratory complications will improve Outcome: Progressing Goal: Cardiovascular complication will be avoided Outcome: Progressing   Problem: Activity: Goal: Risk for activity intolerance will decrease Outcome: Progressing   Problem: Nutrition: Goal: Adequate nutrition will be maintained Outcome: Progressing   Problem: Coping: Goal: Level of anxiety will decrease Outcome: Progressing   Problem: Elimination: Goal: Will not experience complications related to bowel motility Outcome: Progressing Goal: Will not experience complications related to urinary retention Outcome: Progressing   Problem: Pain Managment: Goal: General experience of comfort will improve and/or be controlled Outcome: Progressing   Problem: Safety: Goal: Ability to remain free from injury will improve Outcome: Progressing   Problem: Skin Integrity: Goal: Risk for impaired skin integrity will decrease Outcome: Progressing   Problem: Education: Goal: Ability to describe self-care  measures that may prevent or decrease complications (Diabetes Survival Skills Education) will improve Outcome: Progressing Goal: Individualized Educational Video(s) Outcome: Progressing   Problem: Coping: Goal: Ability to adjust to condition or change in health will improve Outcome: Progressing   Problem: Fluid Volume: Goal: Ability to maintain a balanced intake and output will improve Outcome: Progressing   Problem: Health Behavior/Discharge Planning: Goal: Ability to identify and utilize available resources and services will improve Outcome: Progressing Goal: Ability to manage health-related needs will improve Outcome: Progressing   Problem: Metabolic: Goal: Ability to maintain appropriate glucose levels will improve Outcome: Progressing   Problem: Nutritional: Goal: Maintenance of adequate nutrition will improve Outcome: Progressing Goal: Progress toward achieving an optimal weight will improve Outcome: Progressing   Problem: Skin Integrity: Goal: Risk for impaired skin integrity will decrease Outcome: Progressing   Problem: Tissue Perfusion: Goal: Adequacy of tissue perfusion will improve Outcome: Progressing   Problem: Education: Goal: Knowledge of the prescribed therapeutic regimen will improve Outcome: Progressing   Problem: Clinical Measurements: Goal: Usual level of consciousness will be regained or maintained. Outcome: Progressing Goal: Neurologic status will improve Outcome: Progressing Goal: Ability to maintain intracranial pressure will improve Outcome: Progressing   Problem: Skin Integrity: Goal: Demonstration of wound healing without infection will improve Outcome: Progressing

## 2023-08-13 NOTE — Discharge Summary (Addendum)
 Physician Discharge Summary   Jack White:[id number] DOB: [birth year] DOA: 07/21/2023  PCP: Nestor Banter, MD  Admit date: 07/21/2023 Discharge date: 08/13/2023  Admitted From: Home Disposition: SNF Discharging physician: Faith Homes, MD Barriers to discharge: none   Discharge Condition: stable CODE STATUS: DNR (no CPR, may intubate) Diet recommendation:  Diet Orders (From admission, onward)     Start     Ordered   08/06/23 1003  DIET DYS 2 Room service appropriate? Yes; Fluid consistency: Thin  Diet effective now       Comments: Full supervision with meals Needs assist with feeding if family not present Meds crushed in applesauce/pudding Reflux precautions (sit up during meals and for 30-45 minutes after)  Question Answer Comment  Room service appropriate? Yes   Fluid consistency: Thin      08/06/23 1006            Hospital Course: 83 year old gentleman with past medical history significant for hypertension, hyperlipidemia, diabetes, CKD stage IIIa, possible Parkinson disease, trigeminal neuralgia, BPH who presents to7/2025 from home via EMS after mechanical fall with associated headache, left-sided weakness, nausea vomiting.  Code stroke was initiated.  No anticoagulation or antiplatelets on outpatient.  Workup in the ED was notable for a large right subdural hematoma with 1.6 mm right-to-left midline shift.  Neurosurgery was consulted and taken for emergent right craniotomy, admitted to the neuro ICU under the neurosurgical service with PCCM consulted.   Significant hospital events: 5/27: Admit by neurosurgery for SDH s/p craniotomy, PCCM consulted; extubated. 5/28: Intermittent agitation/confusion, difficulty sleeping 5/29: Restlessness overnight, sleeping poorly on a.m. assessment 5/30: Repeat CT head stable, transferred out of ICU; Eval by CIR>does not meet level of care 6/1: CCM reconsulted for hypoxia, CT angiogram chest negative for PE, small bilateral  pleural effusions, multifocal groundglass opacities bilateral upper lobes, right middle lobe concerning for infectious process. 6/2: re-eval by CIR> continues to not meet level of care and signed off 6/3: Encephalopathy improved, following commands for RN (thumbs up, wiggling toes, stating name)Fatigues quickly, but more awake than prior  6/4: Hemoglobin down to 6.9, 1 unit PRBC ordered 6/5: Creatinine rising, sodium improved with free water , IVC on POCUS collapsible with marked respiratory variation, IV fluids given; concern for possible aspiration event following swallow evaluation. 6/6: Creatinine improved, stopped IV fluids; mental status waxing/waning; started on Provigil  6/9: Transferred to [facility]; pain overnight, given dose of morphine  by PCCM coverage; awaiting MBS by SLP 6/10: MBS completed, started on clear liquid diet. 6/11: Advanced to dysphagia 2 diet.  Staples removed by neurosurgery.  Transitioned to nocturnal feeds only. 6/12.  Transferred to med telemetry floor. 6/17: Mental status fluctuates, he has days he is more alert, others he is more sleepy.    Traumatic subdural hematoma with midline shift s/p craniotomy - Patient presents after mechanical fall with associated left-sided weakness, nausea vomiting and confusion. - CT head showed large right subdural hematoma with 1.6 mm right-to-left midline shift. - Patient was taken emergently for craniotomy by neurosurgery.  - Repeat CT head 5/28 with decreased right subdural hematoma mass effect and left midline shift.  Right subdural drain in place. -CT head 5/31: residual subdural hematoma 1.5 cm in thickness with mass effect right cerebral hemisphere 5 to 6 mm right-to-left shift, smaller left-sided SDH without mass effect, not significant change from prior.  -Staples removed by neurosurgery 08/07/2023 - Patient will need skilled nursing facility for rehab -Patient has been more alert, and answering some questions. MS fluctuates, he  has days he is more sleepy.  -CT head 6/15 looks stable.    Abdominal Pain: Resolved 6-16:Patient is complaining of abdominal pain, tender supra pubic area.   Start IV PPI No significant Bladder retention.  UA negative for infection.  Pain resolved with gas x, decision was made to defer CT abdomen.    BL wheezing - resolved  - s/p nebs -Chest x ray: Improving pulmonary infiltrates with residual left lower lobe retrocardiac atelectasis.   Hyperkalemia - resolved  - Received insulin  and dextrose . Status post Lokelma  . -In setting AKI.    Acute renal failure on CKD stage IIIA Patient baseline creatinine 1.3--- 1.5 - Creatinine peak 2.7---1.6 Renal function stable   Hypoactive delirium -On Provigil  by PCCM - Delirium  precautions   Aspiration pneumonia - resolved  -6/01: Patient became hypoxic.  -CTA chest: negative for PE but with small bilateral pleural effusions and multifocal groundglass opacities bilateral upper lobes, right middle lobe concerning for infectious process, likely aspiration.  -Completed 5 days of Unasyn  on 6/06   Moderate Malnutrition;  He removed Tube feeding.  Discussed with family, plan to see how he does over weekend. He ate 100% breakfast, he needs to be fed.    Anemia Received 1 unit of packed red blood cells  Anemia panel. T sat low, started  Iron supplement and  folic acid.    Hypertension - Resume low dose metoprolol    Hyperlipidemia -on lipitor   DM2 Hemoglobin A1c 6.5%. -- continue metformin     Parkinson's disease -- Sinemet  1 tablet 3 times daily  Daughter report, that Dr Tat, was questioning diagnosis for Parkinson. She was asking if his medications can be reduce to help prevent sleepiness.  -Neurology consulted. Dat Scan are not done inpatient.  -neurology recommend to continue current dose of sinemet .  - outpatient follow up with Dr. Winferd Hatter   Trigeminal neuralgia Follow-up with neurology outpatient, Dr. Winferd Hatter; last seen  07/16/2023. -- Tegretol  level 5.7 on 07/30/2023 -- Continue Tegretol  200 mg per tube TID   BPH -- Doxazosin  4 mg per tube daily   Insomnia Reports patient takes 10 mg of melatonin and 50 mg of Benadryl  nightly at home. -- Increased  6/09:  melatonin to 10 mg nightly.    having issues at night with sleep. Daughter report he takes 50 mg benadryl  at home, and have been doing it for years.  He was able to sleep better.    Left heel ulcer, POA Sacral/coccyx skin tear Seen by podiatry at [facility], underwent debridement on 5/22. -- Cleanse L heel wound with NS, apply Bacitracin  ointment to wound bed daily, cover with Telfa nonstick dressing and secure with silicone foam or Kerlix roll gauze whichever is preferred.  -- Apply sacral foam dressing, change every 3 days or PRN    Weakness/debility/deconditioning: -- Continue PT/OT efforts -- Will need SNF placement   Nutrition Problem: Moderate Malnutrition Etiology: chronic illness Signs/Symptoms: moderate fat depletion, moderate muscle depletion  Interventions: Tube feeding, Prostat, MVI   Estimated body mass index is 30.23 kg/m as calculated from the following:   Height as of this encounter: 6' (1.829 m).   Weight as of this encounter: 101.1 kg   Principal Diagnosis: Subdural hematoma [facility])  Discharge Diagnoses: Active Hospital Problems   Diagnosis Date Noted   Acute renal failure superimposed on stage 3a chronic kidney disease (HCC) 08/13/2023   Hyperkalemia 08/13/2023   Aspiration pneumonia (HCC) 08/13/2023   Anemia 08/13/2023   HLD (hyperlipidemia) 08/13/2023  Parkinson disease (HCC) 08/13/2023   Trigeminal neuralgia 08/13/2023   BPH (benign prostatic hyperplasia) 08/13/2023   Insomnia 08/13/2023   Moderate malnutrition (HCC) 07/23/2023   Diabetes mellitus, type 2 (HCC) 10/14/2014   HTN (hypertension) 10/14/2014    Resolved Hospital Problems   Diagnosis Date Noted Date Resolved   Subdural hematoma (HCC) 07/22/2023 08/13/2023     Priority: 1.   Status post surgery 07/22/2023 08/13/2023     Discharge Instructions     Discharge wound care:   Complete by: As directed    Coccyx: Apply sacral foam dressing, change every 3 days or PRN  Left Heel: Cleanse L heel wound with NS, apply Bacitracin  ointment to wound bed daily, cover with Telfa nonstick dressing and secure with silicone foam or Kerlix roll gauze whichever is preferred   Increase activity slowly   Complete by: As directed       Allergies as of 08/13/2023       Reactions   Dilaudid [hydromorphone Hcl] Other (See Comments)   Respiratory arrest        Medication List     STOP taking these medications    aspirin  81 MG tablet   carbamazepine  200 MG 12 hr capsule Commonly known as: CARBATROL    carbamazepine  200 MG 12 hr tablet Commonly known as: TEGRETOL  XR Replaced by: carbamazepine  200 MG tablet   DULoxetine  60 MG capsule Commonly known as: CYMBALTA    metoprolol  succinate 25 MG 24 hr tablet Commonly known as: TOPROL -XL   ondansetron  4 MG tablet Commonly known as: ZOFRAN    ondansetron  8 MG disintegrating tablet Commonly known as: ZOFRAN -ODT       TAKE these medications    acetaminophen  500 MG tablet Commonly known as: TYLENOL  Take 500-1,000 mg by mouth every 6 (six) hours as needed for moderate pain (pain score 4-6).   atorvastatin  10 MG tablet Commonly known as: LIPITOR Take 1 tablet (10 mg total) by mouth at bedtime. What changed:  how much to take when to take this   bacitracin  ointment Apply topically daily. Cleanse L heel wound with NS, apply Bacitracin  ointment to wound bed daily, cover with Telfa nonstick dressing and secure with silicone foam or Kerlix roll gauze whichever is preferred Start taking on: August 14, 2023   carbamazepine  200 MG tablet Commonly known as: TEGRETOL  Take 1 tablet (200 mg total) by mouth 3 (three) times daily. Replaces: carbamazepine  200 MG 12 hr tablet   carbidopa -levodopa  25-100 MG  tablet Commonly known as: SINEMET  IR Take 1 tablet by mouth 3 (three) times daily.   clonazePAM 0.5 MG tablet Commonly known as: KLONOPIN Take 0.5 tablets (0.25 mg total) by mouth daily as needed for anxiety.   diphenhydrAMINE  25 MG tablet Commonly known as: BENADRYL  Take 2 tablets (50 mg total) by mouth at bedtime. What changed: how much to take   doxazosin  4 MG tablet Commonly known as: CARDURA  Take 4 mg by mouth daily.   ergocalciferol 1.25 MG (50000 UT) capsule Commonly known as: VITAMIN D2 Take 50,000 Units by mouth once a week.   ferrous sulfate 325 (65 FE) MG tablet Take 1 tablet (325 mg total) by mouth daily with breakfast. Start taking on: August 14, 2023   fluticasone  50 MCG/ACT nasal spray Commonly known as: FLONASE  Place 2 sprays into both nostrils daily as needed for allergies.   folic acid 1 MG tablet Commonly known as: FOLVITE Take 1 tablet (1 mg total) by mouth daily. Start taking on: August 14, 2023  gabapentin  300 MG capsule Commonly known as: NEURONTIN  Take 300 mg by mouth at bedtime.   hydrocortisone  cream 1 % Apply 1 Application topically 3 (three) times daily as needed for itching.   hydroxypropyl methylcellulose / hypromellose 2.5 % ophthalmic solution Commonly known as: ISOPTO TEARS / GONIOVISC Place 2 drops into both eyes 4 (four) times daily as needed for dry eyes.   Melatonin 10 MG Tabs Take 10 mg by mouth at bedtime. What changed:  medication strength how much to take   metFORMIN  500 MG 24 hr tablet Commonly known as: GLUCOPHAGE -XR Take 500 mg by mouth daily.   metoprolol  tartrate 25 MG tablet Commonly known as: LOPRESSOR  Take 0.5 tablets (12.5 mg total) by mouth 2 (two) times daily.   modafinil  100 MG tablet Commonly known as: PROVIGIL  Take 1 tablet (100 mg total) by mouth daily. Start taking on: August 14, 2023   montelukast  10 MG tablet Commonly known as: SINGULAIR  Take 10 mg by mouth at bedtime.   omeprazole 20 MG  capsule Commonly known as: PRILOSEC Take 20 mg by mouth daily.   polyethylene glycol 17 g packet Commonly known as: MIRALAX  / GLYCOLAX  Take 17 g by mouth daily as needed.   sennosides 8.8 MG/5ML syrup Commonly known as: SENOKOT Take 5 mLs by mouth at bedtime.   tamsulosin  0.4 MG Caps capsule Commonly known as: FLOMAX  Take 0.4 mg by mouth daily after breakfast.   traMADol  50 MG tablet Commonly known as: ULTRAM  Take 1 tablet (50 mg total) by mouth every 8 (eight) hours as needed for severe pain (pain score 7-10).               Discharge Care Instructions  (From admission, onward)           Start     Ordered   08/13/23 0000  Discharge wound care:       Comments: Coccyx: Apply sacral foam dressing, change every 3 days or PRN  Left Heel: Cleanse L heel wound with NS, apply Bacitracin  ointment to wound bed daily, cover with Telfa nonstick dressing and secure with silicone foam or Kerlix roll gauze whichever is preferred   08/13/23 1158            Contact information for after-discharge care     Destination     [facility] .   Service: Skilled Nursing Contact information: [address] [place]   [phone]                    Allergies  Allergen Reactions   Dilaudid [Hydromorphone Hcl] Other (See Comments)    Respiratory arrest     Discharge Exam: BP 112/70 (BP Location: Left Arm)   Pulse 72   Temp 99.2 F (37.3 C) (Axillary)   Resp 18   Ht 6' (1.829 m)   Wt 101.1 kg   SpO2 98%   BMI 30.23 kg/m  Physical Exam Constitutional:      General: He is not in acute distress.    Appearance: Normal appearance.  HENT:     Head:     Comments: Right surgical scar noted with prior craniotomy     Mouth/Throat:     Mouth: Mucous membranes are moist.   Eyes:     Extraocular Movements: Extraocular movements intact.    Cardiovascular:     Rate and  Rhythm: Normal rate and regular rhythm.  Pulmonary:     Effort:  Pulmonary effort is normal. No respiratory distress.     Breath sounds: Normal breath sounds. No wheezing.  Abdominal:     General: Bowel sounds are normal. There is no distension.     Palpations: Abdomen is soft.     Tenderness: There is no abdominal tenderness.   Musculoskeletal:        General: Normal range of motion.     Cervical back: Normal range of motion and neck supple.   Skin:    General: Skin is warm and dry.   Neurological:     Mental Status: He is alert.     Comments: Contracted B/L UE (L>R); follows commands; mentation slow; gaze preference to right     The results of significant diagnostics from this hospitalization (including imaging, microbiology, ancillary and laboratory) are listed below for reference.   Microbiology: No results found for this or any previous visit (from the past 240 hours).   Labs: BNP (last 3 results) No results for input(s): BNP in the last 8760 hours. Basic Metabolic Panel: Recent Labs  Lab 08/08/23 1100 08/08/23 1822 08/09/23 1124 08/10/23 0711 08/11/23 0436 08/12/23 0853  NA 138  --  138 140 140 140  K 6.0* 5.3* 5.2* 4.7 4.7 4.5  CL 106  --  107 107 110 111  CO2 25  --  23 24 24  19*  GLUCOSE 119*  --  217* 128* 93 182*  BUN 68*  --  60* 54* 45* 37*  CREATININE 1.62*  --  1.68* 1.66* 1.61* 1.48*  CALCIUM  8.3*  --  8.2* 8.3* 8.3* 8.4*  MG 2.9*  --   --   --   --   --    Liver Function Tests: No results for input(s): AST, ALT, ALKPHOS, BILITOT, PROT, ALBUMIN  in the last 168 hours. No results for input(s): LIPASE, AMYLASE in the last 168 hours. No results for input(s): AMMONIA in the last 168 hours. CBC: Recent Labs  Lab 08/08/23 1100 08/09/23 1124 08/10/23 0711 08/11/23 0436 08/12/23 0853  WBC 6.2 5.7 4.4 4.1 4.7  NEUTROABS 4.0  --   --   --   --   HGB 8.0* 7.6* 7.8* 7.9* 8.2*  HCT 25.9* 24.7* 25.4* 25.6* 26.5*  MCV 100.4* 99.2  101.2* 100.8* 102.3*  PLT 220 207 198 182 176   Cardiac Enzymes: No results for input(s): CKTOTAL, CKMB, CKMBINDEX, TROPONINI in the last 168 hours. BNP: Invalid input(s): POCBNP CBG: Recent Labs  Lab 08/12/23 1146 08/12/23 1555 08/12/23 1940 08/12/23 2329 08/13/23 0454  GLUCAP 147* 196* 116* 125* 109*   D-Dimer No results for input(s): DDIMER in the last 72 hours. Hgb A1c No results for input(s): HGBA1C in the last 72 hours. Lipid Profile No results for input(s): CHOL, HDL, LDLCALC, TRIG, CHOLHDL, LDLDIRECT in the last 72 hours. Thyroid  function studies No results for input(s): TSH, T4TOTAL, T3FREE, THYROIDAB in the last 72 hours.  Invalid input(s): FREET3 Anemia work up No results for input(s): VITAMINB12, FOLATE, FERRITIN, TIBC, IRON, RETICCTPCT in the last 72 hours. Urinalysis    Component Value Date/Time   COLORURINE YELLOW 08/11/2023 1601   APPEARANCEUR CLEAR 08/11/2023 1601   APPEARANCEUR Clear 06/22/2014 2230   LABSPEC 1.017 08/11/2023 1601   LABSPEC 1.028 06/22/2014 2230   PHURINE 5.0 08/11/2023 1601   GLUCOSEU NEGATIVE 08/11/2023 1601   GLUCOSEU Negative 06/22/2014 2230   HGBUR NEGATIVE 08/11/2023 1601   BILIRUBINUR NEGATIVE 08/11/2023 1601   BILIRUBINUR Negative 06/22/2014 2230   KETONESUR NEGATIVE 08/11/2023  1601   PROTEINUR NEGATIVE 08/11/2023 1601   NITRITE NEGATIVE 08/11/2023 1601   LEUKOCYTESUR NEGATIVE 08/11/2023 1601   LEUKOCYTESUR Negative 06/22/2014 2230   Sepsis Labs Recent Labs  Lab 08/09/23 1124 08/10/23 0711 08/11/23 0436 08/12/23 0853  WBC 5.7 4.4 4.1 4.7   Microbiology No results found for this or any previous visit (from the past 240 hours).  Procedures/Studies: DG Abd 1 View Result Date: 08/11/2023 CLINICAL DATA:  Abdominal pain EXAM: ABDOMEN - 1 VIEW COMPARISON:  None Available. FINDINGS: The bowel gas pattern is normal. No radio-opaque calculi or other significant radiographic  abnormality are seen. IMPRESSION: Negative. No obstruction residual contrast throughout the distal colon Electronically Signed   By: [name] M.D.   On: 08/11/2023 11:00   DG CHEST PORT 1 VIEW Result Date: 08/11/2023 CLINICAL DATA:  Wheezing EXAM: PORTABLE CHEST 1 VIEW COMPARISON:  July 31, 2023 FINDINGS: Improving pulmonary infiltrates with residual left lower lobe retrocardiac atelectasis without consolidations or pleural effusions Heart normal size IMPRESSION: Improving pulmonary infiltrates with residual left lower lobe retrocardiac atelectasis. Electronically Signed   By: [name] M.D.   On: 08/11/2023 10:59   CT HEAD WO CONTRAST ( ) Result Date: 08/10/2023 EXAM: CT HEAD WITHOUT CONTRAST 08/10/2023 12:05:10 PM TECHNIQUE: CT of the head was performed without the administration of intravenous contrast. Automated exposure control, iterative reconstruction, and/or weight based adjustment of the mA/kV was utilized to reduce the radiation dose to as low as reasonably achievable. COMPARISON: CT head without contrast 07/26/2023. CT head without contrast 07/25/2023. CLINICAL HISTORY: Assess for subdural hematoma enlargement. FINDINGS: BRAIN AND VENTRICLES: Right frontal craniotomy is again noted. Expected evolution of the extra-axial collection is noted. Decreased density is present within the collection with some layering posteriorly. Persistent mass effect is present with effacement of the sulci. Midline shift of 6 mm is similar to the prior exam. No new hemorrhage is present. No cortical infarct is present. ORBITS: Bilateral lens replacements are noted. The globes and orbits are otherwise within normal limits. SINUSES: The visualized paranasal sinuses and mastoid air cells demonstrate no acute abnormality. SOFT TISSUES AND SKULL: No acute abnormality of the visualized skull or soft tissues. VASCULATURE: Atherosclerotic changes are present within the cavernous internal carotid arteries bilaterally. No  hyperdense vessel is present. IMPRESSION: 1. Expected evolution of the extra-axial collection with decreased density and layering posteriorly. Persistent mass effect with effacement of the sulci and midline shift of 6 mm, similar to the prior exam. 2. No new hemorrhage or cortical infarct. Electronically signed by: Audree Leas MD 08/10/2023 12:15 PM EDT RP Workstation: [id number]   DG Swallowing Func-Speech Pathology Result Date: 08/05/2023 Table formatting from the original result was not included. Modified Barium Swallow Study Patient Details Name: DAMONTE FRIESON MRN: [id number] Date of Birth: [birth year] Today's Date: 08/05/2023 HPI/PMH: HPI: 34 yoM with PMH as below, significant for HTN, HLD, DMT2, possible parkinsons disease, trigeminal neuralgia, CKD 3a, and BPH, who presented after mechanical fall on 5/27, not on AC or antiplatelets who developed N/V, AMS, with left sided weakness found to have large right SDH with 1.100m right to left MLS.  Admitted to NSGY and taken for emergent right craniotomy.  Was seen by PCCM while in Neuro ICU, extubated 5/27 evening.  ICU stay complicated by hypoactive delirium, cortrak placed for meds/ nutrition.  pt transferred out of ICU 5/30, CTH unchanged 5/31.  Started to develop increased fever curve.  On 6/1 PCCM re-consulted for development on hypoxia, previously on room air with  upper airway congestion.  Daughter reports has not been coughing and clearing secretion. Clinical Impression: Clinical Impression: Pt has an oral dysphagia with esophageal component, but his pharyngeal phase is generally intact. Oral phase is marked by reduced labial seal and disorganized lingual transit. There is anterior loss, intermittent premature spillage, and oral residue that is mostly anteriorly located on the floor of his mouth and his bilateral buccal cavities. Mastication is slow with rest breaks (also question potential impact from loosely fitting lower dentures). Pharyngeal  function was appropriate across all consistencies tested with no aspiration and no pharyngeal residue until the barium tablet, which was transiently lodged in the valleculae and then the UES. Once it entered into the esophagus it remained in the proximal portion of the esophagus, not clearing with multiple trials of purees and thin liquids. It ultimately moved down to his distal esophagus when given sips of warm water . Note that other barium was present in the esophagus underneath the pill with some retrograde flow observed, but it did appear to clear more by the time the pill had descended more. Upon completion of the study, after being transferred back to bed, pt did have regurgitation of the barium he had just consumed. Question if esophageal component could have contributed although his esophagus had been mostly cleared prior to laying him back. His daughter was present for the study and notes that he had been reporting nausea this morning. Recommend starting slowly with clear liquid diet (thin liquids). If he can tolerate this well, anticipate that he will be able to advance up to Dys 2 (finely chopped) diet with use of aspiration and esophageal precautions. Would crush PO medications. RN and MD both updated on recommendations as well. Factors that may increase risk of adverse event in presence of aspiration Roderick Civatte & Jessy Morocco 2021): Factors that may increase risk of adverse event in presence of aspiration Roderick Civatte & Jessy Morocco 2021): Respiratory or GI disease; Reduced cognitive function; Limited mobility; Dependence for feeding and/or oral hygiene Recommendations/Plan: Swallowing Evaluation Recommendations Swallowing Evaluation Recommendations Recommendations: PO diet PO Diet Recommendation: Clear liquid diet; Thin liquids (Level 0) Liquid Administration via: Cup; Straw Medication Administration: Crushed with puree Supervision: Staff to assist with self-feeding; Full supervision/cueing for swallowing strategies  Swallowing strategies  : Slow rate; Small bites/sips; Check for anterior loss; Follow solids with liquids Postural changes: Position pt fully upright for meals; Stay upright 30-60 min after meals Oral care recommendations: Oral care BID (2x/day) Caregiver Recommendations: Have oral suction available Treatment Plan Treatment Plan Treatment recommendations: Therapy as outlined in treatment plan below Follow-up recommendations: Skilled nursing-short term rehab (<3 hours/day) Functional status assessment: Patient has had a recent decline in their functional status and demonstrates the ability to make significant improvements in function in a reasonable and predictable amount of time. Treatment frequency: Min 2x/week Treatment duration: 2 weeks Interventions: Aspiration precaution training; Compensatory techniques; Patient/family education; Trials of upgraded texture/liquids; Diet toleration management by SLP Recommendations Recommendations for follow up therapy are one component of a multi-disciplinary discharge planning process, led by the attending physician.  Recommendations may be updated based on patient status, additional functional criteria and insurance authorization. Assessment: Orofacial Exam: Orofacial Exam Oral Cavity - Dentition: Dentures, top; Dentures, bottom (bottom dentures loose) Anatomy: Anatomy: Suspected cervical osteophytes; Other (Comment) (naturally positioned in more of a chin tucked position) Boluses Administered: Boluses Administered Boluses Administered: Thin liquids (Level 0); Mildly thick liquids (Level 2, nectar thick); Puree; Solid  Oral Impairment Domain: Oral Impairment Domain Lip Closure: Escape  beyond mid-chin Tongue control during bolus hold: Posterior escape of greater than half of bolus Bolus preparation/mastication: Slow prolonged chewing/mashing with complete recollection Bolus transport/lingual motion: Repetitive/disorganized tongue motion Oral residue: Residue collection on  oral structures Location of oral residue : Floor of mouth; Tongue; Lateral sulci Initiation of pharyngeal swallow : Pyriform sinuses (but mostly above the valleculae)  Pharyngeal Impairment Domain: Pharyngeal Impairment Domain Soft palate elevation: No bolus between soft palate (SP)/pharyngeal wall (PW) Laryngeal elevation: Complete superior movement of thyroid  cartilage with complete approximation of arytenoids to epiglottic petiole Anterior hyoid excursion: Complete anterior movement Epiglottic movement: Complete inversion Laryngeal vestibule closure: Complete, no air/contrast in laryngeal vestibule Pharyngeal stripping wave : Present - complete Pharyngeal contraction (A/P view only): N/A Pharyngoesophageal segment opening: Complete distension and complete duration, no obstruction of flow Tongue base retraction: No contrast between tongue base and posterior pharyngeal wall (PPW) Pharyngeal residue: Complete pharyngeal clearance Location of pharyngeal residue: N/A  Esophageal Impairment Domain: Esophageal Impairment Domain Esophageal clearance upright position: Esophageal retention with retrograde flow through the PES Pill: Pill Consistency administered: Thin liquids (Level 0); Puree Thin liquids (Level 0): Impaired (see clinical impressions) Puree: Impaired (see clinical impressions) Penetration/Aspiration Scale Score: Penetration/Aspiration Scale Score 1.  Material does not enter airway: Solid; Puree; Pill 2.  Material enters airway, remains ABOVE vocal cords then ejected out: Thin liquids (Level 0); Mildly thick liquids (Level 2, nectar thick) Compensatory Strategies: Compensatory Strategies Compensatory strategies: No   General Information: Caregiver present: Yes (dtr, Sharon)  Diet Prior to this Study: NPO; Cortrak/Small bore NG tube   Temperature : Normal   Respiratory Status: WFL   Supplemental O2: None (Room air)   History of Recent Intubation: Yes  Behavior/Cognition: Alert; Cooperative; Pleasant mood;  Requires cueing Self-Feeding Abilities: Dependent for feeding Baseline vocal quality/speech: Normal No data recorded No data recorded Exam Limitations: No limitations Goal Planning: Prognosis for improved oropharyngeal function: Good Barriers to Reach Goals: Cognitive deficits No data recorded Patient/Family Stated Goal: family asking for POs for patient Consulted and agree with results and recommendations: Patient; Family member/caregiver; Physician; Nurse (dtr, Genevia Kern, present for study) Pain: Pain Assessment Pain Assessment: Faces Faces Pain Scale: 0 Facial Expression: 0 Body Movements: 0 Muscle Tension: 0 Compliance with ventilator (intubated pts.): N/A Vocalization (extubated pts.): 0 CPOT Total: 0 Pain Location: Moans with BLE movement Pain Descriptors / Indicators: Moaning Pain Intervention(s): Monitored during session End of Session: Start Time:SLP Start Time (ACUTE ONLY): 1344 Stop Time: SLP Stop Time (ACUTE ONLY): 1430 Time Calculation:SLP Time Calculation (min) (ACUTE ONLY): 46 min Charges: SLP Evaluations $ SLP Speech Visit: 1 Visit SLP Evaluations $MBS Swallow: 1 Procedure $Swallowing Treatment: 1 Procedure SLP visit diagnosis: SLP Visit Diagnosis: Dysphagia, unspecified (R13.10) Past Medical History: Past Medical History: Diagnosis Date  Acute cholecystitis 05/21/14  BP (high blood pressure) 10/14/2014  Chronic kidney disease (CKD), stage III (moderate) (HCC) 02/15/2014  Colon perforation (HCC) 05/28/2014  Diabetes mellitus without complication (HCC)   [name]'s neuralgia 10/14/2014  Headache   Hypertension   Intractable nausea and vomiting 04/07/2023  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 Luise Saint  COLOSTOMY TAKEDOWN N/A 10/26/2014  Procedure: COLOSTOMY TAKEDOWN;  Surgeon: Rhina Center III, MD;  Location: [facility] ORS;  Service: General;  Laterality: N/A;  CRANIOTOMY Right 07/21/2023  Procedure: CRANIOTOMY HEMATOMA  EVACUATION SUBDURAL;  Surgeon: Augusto Blonder, MD;  Location: [facility] OR;  Service: Neurosurgery;  Laterality: Right;  EXPLORATORY LAPAROTOMY  05/28/2014  Dr. Luise Saint  LAPAROSCOPIC CHOLECYSTECTOMY  05/21/2014  Dr. Arthor Billet., M.A. CCC-SLP Acute Rehabilitation Services Office: [phone] Secure chat preferred 08/05/2023, 3:30 PM  DG Chest Port 1 View Result Date: 07/31/2023 CLINICAL DATA:  Aspiration pneumonia EXAM: PORTABLE CHEST 1 VIEW COMPARISON:  July 29, 2023 FINDINGS: No consolidating changes to indicate aspiration pneumonia however, prominence of the interstitial markings could correlate with congestive changes with mild cardiomegaly. Small right pleural reaction or effusion. Feeding tube in the stomach. IMPRESSION: No consolidating changes to indicate aspiration pneumonia however, prominence of the interstitial markings could correlate with congestive changes with mild cardiomegaly. Electronically Signed   By: [name] M.D.   On: 07/31/2023 08:45   DG Chest Port 1 View Result Date: 07/29/2023 CLINICAL DATA:  Pneumonia EXAM: PORTABLE CHEST 1 VIEW COMPARISON:  Chest radiograph dated 07/27/2023 FINDINGS: Lines/tubes: Enteric tube tip reaches the diaphragm and terminates below the field of view. Lungs: Low lung volumes with bronchovascular crowding. Increased diffuse interstitial and patchy opacities. Pleura: No pneumothorax or pleural effusion. Heart/mediastinum: Similar enlarged cardiomediastinal silhouette. Bones: No acute osseous abnormality. IMPRESSION: Low lung volumes with bronchovascular crowding. Increased diffuse interstitial and patchy opacities, which may represent pulmonary edema or multifocal pneumonia. Electronically Signed   By: Limin  Xu M.D.   On: 07/29/2023 08:24   CT Angio Chest Pulmonary Embolism (PE) W or WO Contrast Result Date: 07/27/2023 CLINICAL DATA:  High probability for PE. EXAM: CT ANGIOGRAPHY CHEST WITH CONTRAST TECHNIQUE: Multidetector CT imaging of the chest was  performed using the standard protocol during bolus administration of intravenous contrast. Multiplanar CT image reconstructions and MIPs were obtained to evaluate the vascular anatomy. RADIATION DOSE REDUCTION: This exam was performed according to the departmental dose-optimization program which includes automated exposure control, adjustment of the mA and/or kV according to patient size and/or use of iterative reconstruction technique. CONTRAST:  75mL OMNIPAQUE  IOHEXOL  350 MG/ML SOLN COMPARISON:  CT of the chest 05/18/2014. FINDINGS: Cardiovascular: Satisfactory opacification of the pulmonary arteries to the segmental level. No evidence of pulmonary embolism. The heart is enlarged. There is no pericardial effusion. There are atherosclerotic calcifications of the aorta. Mediastinum/Nodes: Enteric tube is seen throughout nondilated esophagus. Visualized thyroid  gland is within normal limits. No enlarged lymph nodes are seen. Lungs/Pleura: There are small bilateral pleural effusions, left greater than right. There is compressive atelectasis in the bilateral lower lobes. There are small multifocal areas of ground-glass opacities in the bilateral upper lobes and right middle lobe. There is atelectasis in the right middle lobe. Trachea and central airways are patent. Upper Abdomen: No acute abnormality. There are atherosclerotic calcifications of the aorta. Enteric tube tip ends in the gastric antrum Musculoskeletal: Degenerative changes affect the spine. Review of the MIP images confirms the above findings. IMPRESSION: 1. No evidence for pulmonary embolism. 2. Small bilateral pleural effusions, left greater than right. 3. Small multifocal areas of ground-glass opacities in the bilateral upper lobes and right middle lobe, worrisome for infectious/inflammatory process. 4. Cardiomegaly. 5. Aortic atherosclerosis. Aortic Atherosclerosis (ICD10-I70.0). Electronically Signed   By: Tyron Gallon M.D.   On: 07/27/2023 18:36    DG CHEST PORT 1 VIEW Result Date: 07/27/2023 CLINICAL DATA:  Low-grade fever EXAM: PORTABLE CHEST 1 VIEW COMPARISON:  07/22/2023 FINDINGS: Cardiac shadow is stable. Feeding catheter extends into the stomach. Endotracheal tube has been removed in the interval. The lungs are well aerated bilaterally. Mild central vascular congestion is noted. No edema is seen. IMPRESSION: Changes of mild CHF. Electronically Signed   By: Lavonia Powers  Lukens M.D.   On: 07/27/2023 09:54   CT HEAD WO CONTRAST ( ) Result Date: 07/26/2023 CLINICAL DATA:  Initial evaluation for acute mental status change, unknown cause. EXAM: CT HEAD WITHOUT CONTRAST TECHNIQUE: Contiguous axial images were obtained from the base of the skull through the vertex without intravenous contrast. RADIATION DOSE REDUCTION: This exam was performed according to the departmental dose-optimization program which includes automated exposure control, adjustment of the mA and/or kV according to patient size and/or use of iterative reconstruction technique. COMPARISON:  CT from 07/25/2023 FINDINGS: Brain: Examination markedly degraded by motion artifact, limiting assessment. Postoperative changes from prior right frontal craniotomy for subdural evacuation. Residual subdural hematoma measures up to 1.5 cm in thickness. Extension along the posterior falx and right tentorium, similar to prior. Associated mass effect on the subjacent right cerebral hemisphere with 5-6 mm of right-to-left shift, similar. Smaller left-sided subdural hematoma measures up to 3 mm in thickness without significant mass effect, also relatively stable. No evidence for significant interval bleeding since prior. No other new acute intracranial hemorrhage. No visible acute large vessel territory infarct. No mass lesion or hydrocephalus. Basilar cisterns remain patent. Vascular: No visible abnormal hyperdense vessel. Calcified atherosclerosis present at the skull base. Skull: Post craniotomy changes on  the right. Skin staples remain in place. No adverse features. Sinuses/Orbits: Globes orbital soft tissues within normal limits. Scattered mucoperiosteal thickening present throughout the paranasal sinuses. Nasogastric tube in place. Mastoid air cells remain clear. Other: None. IMPRESSION: 1. Motion degraded exam. 2. Postoperative changes from prior right frontal craniotomy for subdural evacuation. Residual subdural hematoma measures up to 1.5 cm in thickness with associated mass effect on the subjacent right cerebral hemisphere and 5-6 mm of right-to-left shift. Smaller left-sided subdural hematoma without significant mass effect. Overall, changes are not significantly changed from prior. 3. No other new acute intracranial abnormality. Electronically Signed   By: Virgia Griffins M.D.   On: 07/26/2023 22:08   CT HEAD WO CONTRAST ( ) Result Date: 07/25/2023 CLINICAL DATA:  83 year old male postoperative day 3 status post right side craniotomy for subdural evacuation. EXAM: CT HEAD WITHOUT CONTRAST TECHNIQUE: Contiguous axial images were obtained from the base of the skull through the vertex without intravenous contrast. RADIATION DOSE REDUCTION: This exam was performed according to the departmental dose-optimization program which includes automated exposure control, adjustment of the mA and/or kV according to patient size and/or use of iterative reconstruction technique. COMPARISON:  07/23/2023 and earlier. FINDINGS: Brain: Right subdural drain removed. Residual mixed density but mostly hyperdense right side subdural hematoma with mild additional regression, now up to 12 mm thickness along the right anterior frontal convexity. Small contralateral left superior convexity subdural remains isodense and subtle. Small volume para falcine and tentorial blood is stable. Further decreased mass effect on the right lateral ventricle. Midline shift has decreased since the 1st postoperative CT, now 5 mm (9-10 mm  initially). No ventriculomegaly. Patent basilar cisterns. Stable gray-white.  No cortically based acute infarct identified. Vascular: Calcified atherosclerosis at the skull base. No suspicious intracranial vascular hyperdensity. Skull: Stable right frontotemporal craniotomy. Sinuses/Orbits: Left nasoenteric tube now in place. Visualized paranasal sinuses and mastoids are stable and well aerated. Other: Percutaneous drain removed, otherwise stable right scalp postoperative changes. Stable orbits soft tissues. IMPRESSION: 1. Right subdural drain removed. Mild additional regression of Right Subdural Hematoma, now up to 12 mm thickness. Contralateral small isodense Left SDH is stable. 2. Decreasing postoperative leftward midline shift, now 5 mm. 3. No new intracranial abnormality. Electronically Signed  By: Marlise Simpers M.D.   On: 07/25/2023 10:55   CT HEAD WO CONTRAST ( ) Result Date: 07/23/2023 CLINICAL DATA:  83 year old male with right side subdural hematoma, postoperative day 1 craniotomy and evacuation. EXAM: CT HEAD WITHOUT CONTRAST TECHNIQUE: Contiguous axial images were obtained from the base of the skull through the vertex without intravenous contrast. RADIATION DOSE REDUCTION: This exam was performed according to the departmental dose-optimization program which includes automated exposure control, adjustment of the mA and/or kV according to patient size and/or use of iterative reconstruction technique. COMPARISON:  Postoperative head CT yesterday and earlier. FINDINGS: Brain: Right side subdural drain remains in place. Mixed density right side subdural hematoma has further decreased, ranging from about 6 mm thickness over the right posterior convexity (previously 11 mm) to 14 mm over the more anterior right cerebral convexity (up to 16 mm yesterday). Corresponding, the intracranial mass effect and leftward midline shift have decreased. Leftward midline shift now is 6-7 mm (9-10 mm yesterday). And right  lateral ventricle patency has improved. Left lateral ventricle remains stable. Much smaller mixed density left side subdural hematoma now measures 2-3 mm at most levels and appears smaller (coronal image 45). A small volume of mostly right side para falcine and tentorial blood has not significantly changed. Basilar cisterns are further improved, particularly the suprasellar cistern. There is a small volume of subdural blood suspected at the ventral foramen magnum, stable. Stable gray-white matter differentiation throughout the brain. No cortically based acute infarct identified. Vascular: Calcified atherosclerosis at the skull base. No suspicious intracranial vascular hyperdensity. Skull: Stable right side craniotomy. Sinuses/Orbits: Visualized paranasal sinuses and mastoids are stable and well aerated. Other: Stable right scalp postoperative changes, skin staples, percutaneous drain. IMPRESSION: 1. Further decreased right side Subdural Hematoma (maximal 14 mm now), intracranial mass effect, and leftward midline shift (6-7 mm now) since yesterday. Right subdural drain remains in place. 2. Stable to decreased much smaller Left side Subdural Hematoma (2-3 mm). 3. No new intracranial abnormality. Electronically Signed   By: Marlise Simpers M.D.   On: 07/23/2023 05:50   DG CHEST PORT 1 VIEW Result Date: 07/22/2023 CLINICAL DATA:  Ventilator dependence. EXAM: PORTABLE CHEST 1 VIEW COMPARISON:  07/21/2023 FINDINGS: Endotracheal tube tip is 3 cm above the base of the carina. The NG tube passes into the stomach although the distal tip position is not included on the film. The cardio pericardial silhouette is enlarged. Vascular congestion without overt edema. Probable trace atelectasis at the lung bases without substantial effusion. Telemetry leads overlie the chest. IMPRESSION: 1. Endotracheal tube tip is 3 cm above the base of the carina. 2. Vascular congestion without overt edema. Electronically Signed   By: Donnal Fusi M.D.    On: 07/22/2023 07:39   CT HEAD WO CONTRAST Result Date: 07/22/2023 CLINICAL DATA:  83 year old male with right side subdural hematoma, code stroke with left-sided weakness. Postoperative day zero right frontoparietal craniotomy and evacuation. EXAM: CT HEAD WITHOUT CONTRAST TECHNIQUE: Contiguous axial images were obtained from the base of the skull through the vertex without intravenous contrast. RADIATION DOSE REDUCTION: This exam was performed according to the departmental dose-optimization program which includes automated exposure control, adjustment of the mA and/or kV according to patient size and/or use of iterative reconstruction technique. COMPARISON:  Head CT 07/21/23 at 2315 hours. FINDINGS: Brain: New postoperative changes. Ongoing mixed density right side subdural hematoma with a subdural space drain now in place. Residual blood products plus hemostatic material in the right subdural space now measure  up to 16 mm in thickness on coronal images (coronal image 42). Versus greater than 20 mm last night (coronal image 46 of that exam). Leftward midline shift now about 10 mm (versus 16-18 mm at the same level preoperatively. Ventricle size and configuration not significantly changed. Mildly improved basilar cisterns. Suspicion now of a smaller mixed density left side subdural hematoma (coronal image 41) 3-4 mm at most levels. Right side predominant para falcine blood, right tentorial blood. No IVH. No SAH identified. No intra-axial hemorrhage identified. Stable gray-white matter differentiation throughout the brain. Vascular: Calcified atherosclerosis at the skull base. No suspicious intracranial vascular hyperdensity. Skull: New right side craniotomy.  Otherwise intact. Sinuses/Orbits: Stable paranasal sinus mucoperiosteal thickening. Tympanic cavities and mastoids remain well aerated. Other: New postoperative changes to the right scalp with skin staples and percutaneous strain in place. Stable orbits  soft tissues. IMPRESSION: 1. Large mixed density Right SDH decreased to 16 mm thickness on this early postoperative scan. 2. Much smaller mixed density Left side SDH is now apparent, 3-4 mm. 3. Decreased leftward midline shift, now 16 mm. Stable ventricle size and configuration. Slightly improved basilar cisterns. 4. Stable gray-white differentiation, no other new intracranial abnormality. Electronically Signed   By: Marlise Simpers M.D.   On: 07/22/2023 05:50   DG Chest Portable 1 View Result Date: 07/22/2023 CLINICAL DATA:  Status post intubation. EXAM: PORTABLE CHEST 1 VIEW COMPARISON:  April 07, 2023 FINDINGS: An endotracheal tube is seen with its distal tip noted at the entrance of the right mainstem bronchus. An enteric tube is also seen with its distal end extending into the stomach. The cardiac silhouette is mildly enlarged and unchanged in size. Low lung volumes are seen. Mild atelectatic changes are suspected within the retrocardiac region of the left lung base. No pleural effusion or pneumothorax is identified. Multilevel degenerative changes seen throughout the thoracic spine. IMPRESSION: 1. Endotracheal tube with its distal tip noted at the entrance of the right mainstem bronchus. Retraction of the endotracheal tube by approximately 3 cm is recommended. 2. Low lung volumes with mild left basilar atelectasis. Electronically Signed   By: Virgle Grime M.D.   On: 07/22/2023 00:11   DG Abd 1 View Result Date: 07/22/2023 CLINICAL DATA:  Confirm nasogastric tube placement. EXAM: ABDOMEN - 1 VIEW COMPARISON:  June 07, 2014 FINDINGS: A nasogastric tube is seen with its distal tip overlying the expected region of the duodenal bulb. The bowel gas pattern is normal. Radiopaque surgical clips are seen within the right upper quadrant. No radio-opaque calculi or other significant radiographic abnormality are seen. IMPRESSION: Nasogastric tube positioning, as described above. Electronically Signed   By: Virgle Grime M.D.   On: 07/22/2023 00:08   CT HEAD CODE STROKE WO CONTRAST Result Date: 07/21/2023 CLINICAL DATA:  Code stroke.  Left-sided weakness EXAM: CT HEAD WITHOUT CONTRAST TECHNIQUE: Contiguous axial images were obtained from the base of the skull through the vertex without intravenous contrast. RADIATION DOSE REDUCTION: This exam was performed according to the departmental dose-optimization program which includes automated exposure control, adjustment of the mA and/or kV according to patient size and/or use of iterative reconstruction technique. COMPARISON:  None Available. FINDINGS: Brain: There is a large mixed density right holohemispheric subdural hematoma that measures up to 23 mm in thickness. There is 16 mm of leftward midline shift with subfalcine herniation of the right cingulate gyrus. There is impending herniation of the right uncus. The right lateral ventricle is effaced. No hydrocephalus. Vascular: There is  atherosclerotic calcification of both internal carotid arteries at the skull base. Skull: Negative Sinuses/Orbits: Negative Other: None IMPRESSION: 1. Large mixed density right holohemispheric subdural hematoma measuring up to 23 mm in thickness with 16 mm of leftward midline shift and subfalcine herniation of the right cingulate gyrus. 2. Impending herniation of the right uncus. Critical Value/emergent results were called by telephone at the time of interpretation on 07/21/2023 at 11:22 pm to provider [facility] , who verbally acknowledged these results. Electronically Signed   By: Juanetta Nordmann M.D.   On: 07/21/2023 23:22     Time coordinating discharge: Over 30 minutes    Faith Homes, MD  [facility] 08/13/2023, 12:39 PM

## 2023-08-13 NOTE — TOC Progression Note (Signed)
 Transition of Care [facility]) - Progression Note    Patient Details  Name: Jack White MRN: [id number] Date of Birth: [birth year]  [facility] CM/SW Contact  Tandy Fam, [place] Phone Number: 08/13/2023, 1:43 PM  Clinical Narrative:  CSW met with MD and daughter at bedside to discuss disposition. Patient and family in agreement with discharge today, only pending insurance approval. CSW sent discharge information to [employer], and contacted [employer] to request update on authorization request; left a voicemail. CSW to follow.     Expected Discharge Plan: Skilled Nursing Facility Barriers to Discharge: Insurance Authorization  Expected Discharge Plan and Services In-house Referral: Clinical Social Work Discharge Planning Services: CM Consult Post Acute Care Choice: Skilled Nursing Facility Living arrangements for the past 2 months: Mobile Home Expected Discharge Date: 08/13/23                                     Social Determinants of Health (SDOH) Interventions SDOH Screenings   Food Insecurity: No Food Insecurity (07/22/2023)  Housing: Low Risk  (07/22/2023)  Transportation Needs: No Transportation Needs (07/22/2023)  Utilities: Not At Risk (07/22/2023)  Financial Resource Strain: Patient Declined (02/07/2023)   Received from [facility]  Social Connections: Moderately Integrated (07/24/2023)  Tobacco Use: Low Risk  (07/22/2023)  Recent Concern: Tobacco Use - Medium Risk (07/17/2023)   Received from [facility]    Readmission Risk Interventions     No data to display

## 2023-08-14 ENCOUNTER — Other Ambulatory Visit (HOSPITAL_COMMUNITY): Payer: Self-pay | Admitting: Neurosurgery

## 2023-08-14 DIAGNOSIS — R58 Hemorrhage, not elsewhere classified: Secondary | ICD-10-CM | POA: Diagnosis not present

## 2023-08-14 DIAGNOSIS — G20C Parkinsonism, unspecified: Secondary | ICD-10-CM | POA: Diagnosis not present

## 2023-08-14 DIAGNOSIS — E785 Hyperlipidemia, unspecified: Secondary | ICD-10-CM | POA: Diagnosis not present

## 2023-08-14 DIAGNOSIS — N179 Acute kidney failure, unspecified: Secondary | ICD-10-CM | POA: Diagnosis not present

## 2023-08-14 DIAGNOSIS — J189 Pneumonia, unspecified organism: Secondary | ICD-10-CM | POA: Diagnosis not present

## 2023-08-14 DIAGNOSIS — E860 Dehydration: Secondary | ICD-10-CM | POA: Diagnosis not present

## 2023-08-14 DIAGNOSIS — R0689 Other abnormalities of breathing: Secondary | ICD-10-CM | POA: Diagnosis not present

## 2023-08-14 DIAGNOSIS — M1990 Unspecified osteoarthritis, unspecified site: Secondary | ICD-10-CM | POA: Diagnosis not present

## 2023-08-14 DIAGNOSIS — R112 Nausea with vomiting, unspecified: Secondary | ICD-10-CM | POA: Diagnosis present

## 2023-08-14 DIAGNOSIS — G20A1 Parkinson's disease without dyskinesia, without mention of fluctuations: Secondary | ICD-10-CM | POA: Diagnosis not present

## 2023-08-14 DIAGNOSIS — R0989 Other specified symptoms and signs involving the circulatory and respiratory systems: Secondary | ICD-10-CM | POA: Diagnosis not present

## 2023-08-14 DIAGNOSIS — N400 Enlarged prostate without lower urinary tract symptoms: Secondary | ICD-10-CM | POA: Diagnosis not present

## 2023-08-14 DIAGNOSIS — G4700 Insomnia, unspecified: Secondary | ICD-10-CM | POA: Diagnosis not present

## 2023-08-14 DIAGNOSIS — Z96641 Presence of right artificial hip joint: Secondary | ICD-10-CM | POA: Diagnosis not present

## 2023-08-14 DIAGNOSIS — R059 Cough, unspecified: Secondary | ICD-10-CM | POA: Diagnosis not present

## 2023-08-14 DIAGNOSIS — J690 Pneumonitis due to inhalation of food and vomit: Secondary | ICD-10-CM | POA: Diagnosis not present

## 2023-08-14 DIAGNOSIS — I129 Hypertensive chronic kidney disease with stage 1 through stage 4 chronic kidney disease, or unspecified chronic kidney disease: Secondary | ICD-10-CM | POA: Diagnosis not present

## 2023-08-14 DIAGNOSIS — I959 Hypotension, unspecified: Secondary | ICD-10-CM | POA: Diagnosis present

## 2023-08-14 DIAGNOSIS — S065X0D Traumatic subdural hemorrhage without loss of consciousness, subsequent encounter: Secondary | ICD-10-CM | POA: Diagnosis not present

## 2023-08-14 DIAGNOSIS — I252 Old myocardial infarction: Secondary | ICD-10-CM | POA: Diagnosis not present

## 2023-08-14 DIAGNOSIS — S065XAA Traumatic subdural hemorrhage with loss of consciousness status unknown, initial encounter: Secondary | ICD-10-CM | POA: Diagnosis not present

## 2023-08-14 DIAGNOSIS — E861 Hypovolemia: Secondary | ICD-10-CM | POA: Diagnosis not present

## 2023-08-14 DIAGNOSIS — Z96651 Presence of right artificial knee joint: Secondary | ICD-10-CM | POA: Diagnosis not present

## 2023-08-14 DIAGNOSIS — N1831 Chronic kidney disease, stage 3a: Secondary | ICD-10-CM | POA: Diagnosis not present

## 2023-08-14 DIAGNOSIS — L89622 Pressure ulcer of left heel, stage 2: Secondary | ICD-10-CM | POA: Diagnosis not present

## 2023-08-14 DIAGNOSIS — F05 Delirium due to known physiological condition: Secondary | ICD-10-CM | POA: Diagnosis not present

## 2023-08-14 DIAGNOSIS — R410 Disorientation, unspecified: Secondary | ICD-10-CM | POA: Diagnosis not present

## 2023-08-14 DIAGNOSIS — R531 Weakness: Secondary | ICD-10-CM | POA: Diagnosis not present

## 2023-08-14 DIAGNOSIS — I6523 Occlusion and stenosis of bilateral carotid arteries: Secondary | ICD-10-CM | POA: Diagnosis not present

## 2023-08-14 DIAGNOSIS — Z48815 Encounter for surgical aftercare following surgery on the digestive system: Secondary | ICD-10-CM | POA: Diagnosis not present

## 2023-08-14 DIAGNOSIS — S31010D Laceration without foreign body of lower back and pelvis without penetration into retroperitoneum, subsequent encounter: Secondary | ICD-10-CM | POA: Diagnosis not present

## 2023-08-14 DIAGNOSIS — E440 Moderate protein-calorie malnutrition: Secondary | ICD-10-CM | POA: Diagnosis not present

## 2023-08-14 DIAGNOSIS — I4891 Unspecified atrial fibrillation: Secondary | ICD-10-CM | POA: Diagnosis not present

## 2023-08-14 DIAGNOSIS — R4182 Altered mental status, unspecified: Secondary | ICD-10-CM | POA: Diagnosis present

## 2023-08-14 DIAGNOSIS — G629 Polyneuropathy, unspecified: Secondary | ICD-10-CM | POA: Diagnosis not present

## 2023-08-14 DIAGNOSIS — W19XXXD Unspecified fall, subsequent encounter: Secondary | ICD-10-CM | POA: Diagnosis not present

## 2023-08-14 DIAGNOSIS — I6782 Cerebral ischemia: Secondary | ICD-10-CM | POA: Diagnosis not present

## 2023-08-14 DIAGNOSIS — E1122 Type 2 diabetes mellitus with diabetic chronic kidney disease: Secondary | ICD-10-CM | POA: Diagnosis not present

## 2023-08-14 DIAGNOSIS — N183 Chronic kidney disease, stage 3 unspecified: Secondary | ICD-10-CM | POA: Diagnosis not present

## 2023-08-14 DIAGNOSIS — R0902 Hypoxemia: Secondary | ICD-10-CM | POA: Diagnosis not present

## 2023-08-14 DIAGNOSIS — Z7951 Long term (current) use of inhaled steroids: Secondary | ICD-10-CM | POA: Diagnosis not present

## 2023-08-14 DIAGNOSIS — D631 Anemia in chronic kidney disease: Secondary | ICD-10-CM | POA: Diagnosis not present

## 2023-08-14 DIAGNOSIS — J90 Pleural effusion, not elsewhere classified: Secondary | ICD-10-CM | POA: Diagnosis not present

## 2023-08-14 DIAGNOSIS — G250 Essential tremor: Secondary | ICD-10-CM | POA: Diagnosis not present

## 2023-08-14 DIAGNOSIS — E875 Hyperkalemia: Secondary | ICD-10-CM | POA: Diagnosis not present

## 2023-08-14 DIAGNOSIS — Z9049 Acquired absence of other specified parts of digestive tract: Secondary | ICD-10-CM | POA: Diagnosis not present

## 2023-08-14 DIAGNOSIS — Z452 Encounter for adjustment and management of vascular access device: Secondary | ICD-10-CM | POA: Diagnosis not present

## 2023-08-14 DIAGNOSIS — M6281 Muscle weakness (generalized): Secondary | ICD-10-CM | POA: Diagnosis not present

## 2023-08-14 DIAGNOSIS — I6200 Nontraumatic subdural hemorrhage, unspecified: Secondary | ICD-10-CM | POA: Diagnosis not present

## 2023-08-14 DIAGNOSIS — D649 Anemia, unspecified: Secondary | ICD-10-CM | POA: Diagnosis not present

## 2023-08-14 DIAGNOSIS — L97429 Non-pressure chronic ulcer of left heel and midfoot with unspecified severity: Secondary | ICD-10-CM | POA: Diagnosis not present

## 2023-08-14 DIAGNOSIS — D509 Iron deficiency anemia, unspecified: Secondary | ICD-10-CM | POA: Diagnosis not present

## 2023-08-14 DIAGNOSIS — Z7401 Bed confinement status: Secondary | ICD-10-CM | POA: Diagnosis not present

## 2023-08-14 DIAGNOSIS — Z85828 Personal history of other malignant neoplasm of skin: Secondary | ICD-10-CM | POA: Diagnosis not present

## 2023-08-14 DIAGNOSIS — G500 Trigeminal neuralgia: Secondary | ICD-10-CM | POA: Diagnosis not present

## 2023-08-14 DIAGNOSIS — R918 Other nonspecific abnormal finding of lung field: Secondary | ICD-10-CM | POA: Diagnosis not present

## 2023-08-14 LAB — GLUCOSE, CAPILLARY
Glucose-Capillary: 105 mg/dL — ABNORMAL HIGH (ref 70–99)
Glucose-Capillary: 109 mg/dL — ABNORMAL HIGH (ref 70–99)
Glucose-Capillary: 111 mg/dL — ABNORMAL HIGH (ref 70–99)

## 2023-08-14 NOTE — Progress Notes (Signed)
 Speech Language Pathology Treatment: Dysphagia;Cognitive-Linguistic  Patient Details Name: Jack White MRN: [id number] DOB: 1940/12/30 Today's Date: 08/14/2023 Time: 4098-1191 SLP Time Calculation (min) (ACUTE ONLY): 45 min  Assessment / Plan / Recommendation Clinical Impression  Pt continues to show steady progress, and is the most alert today that this SLP has seen him. He was a little drowsy at first but woke up well with a little stimulation. He participated in two verbal sequencing tasks, needing Mod cues for the first one for sustained attention but faded to Min cues with the second one when he was more alert. Pt engaged in self-feeding with cup, spoon, and finger foods. Pt was conversant throughout the duration of this session, which is also the most verbal that he has been with me. His speech was intelligible for the most part but he had moments of dysfluencies, of which he showed awareness. His overall awareness seems to be improving too, commenting today that his mind feels like it is not working the same. Pt was also an active participant in medication administration, communicating preferences with nursing including about how to take them.   With these improvements in cognition and communication, pt was also given advanced trials of regular solids in addition to the Dys 2 types of textures on his breakfast tray. He has improved mastication and oral clearance, with very mild lingual residue observed with regular, dry solids only. A thin liquid wash typically facilitated this. Pt started to have a little delayed coughing after consuming a lot of POs between breakfast items and med administration, and this was associated with his report of starting to feel like he was filling up. He self-advocated for a few sips of milk and bites of sherbet to help alleviate that. Small, more frequent intake is likely beneficial given baseline esophageal component. In review of chart, his PO intake seems to be  increased, but he is also still asking for certain foods that are not within his current diet level. Recommend advancement to Dys 3 solids (mechanical soft), continuing with thin liquids and use of both aspiration and esophageal precautions. Will continue to follow. Pt will benefit from ongoing SLP f/u acutely and upon discharge to maximize cognitive and communicative function.   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  Admitted to NSGY and taken for emergent right craniotomy.  Was seen by PCCM while in Neuro ICU, extubated 5/27 evening.  ICU stay complicated by hypoactive delirium, cortrak placed for meds/ nutrition.  pt transferred out of ICU 5/30, CTH unchanged 5/31.  Started to develop increased fever curve.  On 6/1 PCCM re-consulted for development on hypoxia, previously on room air with upper airway congestion.  Daughter reports has not been coughing and clearing secretion.      SLP Plan  Continue with current plan of care          Recommendations  Diet recommendations: Dysphagia 3 (mechanical soft);Thin liquid Liquids provided via: Cup;Straw Medication Administration: Crushed with puree Supervision: Full supervision/cueing for compensatory strategies;Trained caregiver to feed patient;Staff to assist with self feeding Compensations: Slow rate;Small sips/bites Postural Changes and/or Swallow Maneuvers: Seated upright 90 degrees;Upright 30-60 min after meal  Oral care BID   Frequent or constant Supervision/Assistance Cognitive communication deficit (R41.841);Dysphagia, unspecified (R13.10)     Continue with current plan of care     Beth Brooke., M.A. CCC-SLP Acute Rehabilitation  Services Office: [phone]  Secure chat preferred   08/14/2023, 10:37 AM

## 2023-08-14 NOTE — TOC Transition Note (Signed)
 Transition of Care [facility]) - Discharge Note   Patient Details  Name: Jack White MRN: [id number] Date of Birth: 1940/07/15  [facility] CM/SW Contact:  Charise Companion, LCSW Phone Number: 08/14/2023, 1:30 PM   Clinical Narrative:    CSW updated by MD that peer to peer was completed and pt was approved. CSW confirmed with [employer] that pt can still admit today and sent over updated discharge information. CSW met with daughter at bedside to update, she is in agreement with discharge. Transport arranged with [facility] for next available.   Nurse to call report to [phone] Room 603   Final next level of care: Skilled Nursing Facility Barriers to Discharge: Barriers Resolved   Patient Goals and CMS Choice Patient states their goals for this hospitalization and ongoing recovery are:: patient unable to participate in assessment          Discharge Placement              Patient chooses bed at: [facility] Patient to be transferred to facility by: [facility] Name of family member notified: Genevia Kern Patient and family notified of of transfer: 08/14/23  Discharge Plan and Services Additional resources added to the After Visit Summary for   In-house Referral: Clinical Social Work Discharge Planning Services: CM Consult Post Acute Care Choice: Skilled Nursing Facility                               Social Drivers of Health (SDOH) Interventions SDOH Screenings   Food Insecurity: No Food Insecurity (07/22/2023)  Housing: Low Risk  (07/22/2023)  Transportation Needs: No Transportation Needs (07/22/2023)  Utilities: Not At Risk (07/22/2023)  Financial Resource Strain: Patient Declined (02/07/2023)   [facility]  Social Connections: Moderately Integrated (07/24/2023)  Tobacco Use: Low Risk  (07/22/2023)  Recent Concern: Tobacco Use - Medium Risk (07/17/2023)   Received from [facility]      Readmission Risk Interventions     No data to display

## 2023-08-14 NOTE — Discharge Summary (Signed)
 Physician Discharge Summary   Jack White ZOX:[id number] DOB: 06/13/1940 DOA: 07/21/2023  PCP: Nestor Banter, MD  Admit date: 07/21/2023 Discharge date: 08/14/2023  Admitted From: Home Disposition: SNF Discharging physician: Faith Homes, MD Barriers to discharge: none   Discharge Condition: stable CODE STATUS: DNR (no CPR, may intubate) Diet recommendation:  Diet Orders (From admission, onward)     Start     Ordered   08/14/23 1036  DIET DYS 3 Room service appropriate? Yes with Assist; Fluid consistency: Thin  Diet effective now       Comments: Full supervision with meals Needs assist with feeding if family not present Meds crushed in applesauce/pudding Reflux precautions (sit up during meals and for 30-45 minutes after)  Question Answer Comment  Room service appropriate? Yes with Assist   Fluid consistency: Thin      08/14/23 1035            Hospital Course: 83 year old gentleman with past medical history significant for hypertension, hyperlipidemia, diabetes, CKD stage IIIa, possible Parkinson disease, trigeminal neuralgia, BPH who presents to7/2025 from home via EMS after mechanical fall with associated headache, left-sided weakness, nausea vomiting.  Code stroke was initiated.  No anticoagulation or antiplatelets on outpatient.  Workup in the ED was notable for a large right subdural hematoma with 1.6 mm right-to-left midline shift.  Neurosurgery was consulted and taken for emergent right craniotomy, admitted to the neuro ICU under the neurosurgical service with PCCM consulted.   Significant hospital events: 5/27: Admit by neurosurgery for SDH s/p craniotomy, PCCM consulted; extubated. 5/28: Intermittent agitation/confusion, difficulty sleeping 5/29: Restlessness overnight, sleeping poorly on a.m. assessment 5/30: Repeat CT head stable, transferred out of ICU; Eval by CIR>does not meet level of care 6/1: CCM reconsulted for hypoxia, CT angiogram chest negative  for PE, small bilateral pleural effusions, multifocal groundglass opacities bilateral upper lobes, right middle lobe concerning for infectious process. 6/2: re-eval by CIR> continues to not meet level of care and signed off 6/3: Encephalopathy improved, following commands for RN (thumbs up, wiggling toes, stating name)Fatigues quickly, but more awake than prior  6/4: Hemoglobin down to 6.9, 1 unit PRBC ordered 6/5: Creatinine rising, sodium improved with free water , IVC on POCUS collapsible with marked respiratory variation, IV fluids given; concern for possible aspiration event following swallow evaluation. 6/6: Creatinine improved, stopped IV fluids; mental status waxing/waning; started on Provigil  6/9: Transferred to [facility]; pain overnight, given dose of morphine  by PCCM coverage; awaiting MBS by SLP 6/10: MBS completed, started on clear liquid diet. 6/11: Advanced to dysphagia 2 diet.  Staples removed by neurosurgery.  Transitioned to nocturnal feeds only. 6/12.  Transferred to med telemetry floor. 6/17: Mental status fluctuates, he has days he is more alert, others he is more sleepy.    Traumatic subdural hematoma with midline shift s/p craniotomy - Patient presents after mechanical fall with associated left-sided weakness, nausea vomiting and confusion. - CT head showed large right subdural hematoma with 1.6 mm right-to-left midline shift. - Patient was taken emergently for craniotomy by neurosurgery.  - Repeat CT head 5/28 with decreased right subdural hematoma mass effect and left midline shift.  Right subdural drain in place. -CT head 5/31: residual subdural hematoma 1.5 cm in thickness with mass effect right cerebral hemisphere 5 to 6 mm right-to-left shift, smaller left-sided SDH without mass effect, not significant change from prior.  -Staples removed by neurosurgery 08/07/2023 - Patient will need skilled nursing facility for rehab -Patient has been more alert, and answering some  questions. MS fluctuates, he has days he is more sleepy.  -CT head 6/15 looks stable.    Abdominal Pain: Resolved 6-16:Patient is complaining of abdominal pain, tender supra pubic area.   Start IV PPI No significant Bladder retention.  UA negative for infection.  Pain resolved with gas x, decision was made to defer CT abdomen.    BL wheezing - resolved  - s/p nebs -Chest x ray: Improving pulmonary infiltrates with residual left lower lobe retrocardiac atelectasis.   Hyperkalemia - resolved  - Received insulin  and dextrose . Status post Lokelma  . -In setting AKI.    Acute renal failure on CKD stage IIIA Patient baseline creatinine 1.3--- 1.5 - Creatinine peak 2.7---1.6 Renal function stable   Hypoactive delirium -On Provigil  by PCCM - Delirium  precautions   Aspiration pneumonia - resolved  -6/01: Patient became hypoxic.  -CTA chest: negative for PE but with small bilateral pleural effusions and multifocal groundglass opacities bilateral upper lobes, right middle lobe concerning for infectious process, likely aspiration.  -Completed 5 days of Unasyn  on 6/06   Moderate Malnutrition;  He removed Tube feeding.  Discussed with family, plan to see how he does over weekend. He ate 100% breakfast, he needs to be fed.    Anemia Received 1 unit of packed red blood cells  Anemia panel. T sat low, started  Iron supplement and  folic acid.    Hypertension - Resume low dose metoprolol    Hyperlipidemia -on lipitor   DM2 Hemoglobin A1c 6.5%. -- continue metformin     Parkinson's disease -- Sinemet  1 tablet 3 times daily  Daughter report, that Dr Tat, was questioning diagnosis for Parkinson. She was asking if his medications can be reduce to help prevent sleepiness.  -Neurology consulted. Dat Scan are not done inpatient.  -neurology recommend to continue current dose of sinemet .  - outpatient follow up with Dr. Winferd Hatter   Trigeminal neuralgia Follow-up with neurology outpatient,  Dr. Winferd Hatter; last seen 07/16/2023. -- Tegretol  level 5.7 on 07/30/2023 -- Continue Tegretol  200 mg per tube TID   BPH -- Doxazosin  4 mg per tube daily   Insomnia Reports patient takes 10 mg of melatonin and 50 mg of Benadryl  nightly at home. -- Increased  6/09:  melatonin to 10 mg nightly.    having issues at night with sleep. Daughter report he takes 50 mg benadryl  at home, and have been doing it for years.  He was able to sleep better.    Left heel ulcer, POA Sacral/coccyx skin tear Seen by podiatry at [facility], underwent debridement on 5/22. -- Cleanse L heel wound with NS, apply Bacitracin  ointment to wound bed daily, cover with Telfa nonstick dressing and secure with silicone foam or Kerlix roll gauze whichever is preferred.  -- Apply sacral foam dressing, change every 3 days or PRN    Weakness/debility/deconditioning: -- Continue PT/OT efforts -- Will need SNF placement   Nutrition Problem: Moderate Malnutrition Etiology: chronic illness Signs/Symptoms: moderate fat depletion, moderate muscle depletion  Interventions: Tube feeding, Prostat, MVI   Estimated body mass index is 30.23 kg/m as calculated from the following:   Height as of this encounter: 6' (1.829 m).   Weight as of this encounter: 101.1 kg   Principal Diagnosis: Subdural hematoma [facility])  Discharge Diagnoses: Active Hospital Problems   Diagnosis Date Noted   Acute renal failure superimposed on stage 3a chronic kidney disease (HCC) 08/13/2023   Hyperkalemia 08/13/2023   Aspiration pneumonia (HCC) 08/13/2023   Anemia 08/13/2023   HLD (  hyperlipidemia) 08/13/2023   Parkinson disease (HCC) 08/13/2023   Trigeminal neuralgia 08/13/2023   BPH (benign prostatic hyperplasia) 08/13/2023   Insomnia 08/13/2023   Moderate malnutrition (HCC) 07/23/2023   Diabetes mellitus, type 2 (HCC) 10/14/2014   HTN (hypertension) 10/14/2014    Resolved Hospital Problems   Diagnosis Date Noted Date Resolved   Subdural hematoma (HCC)  07/22/2023 08/13/2023    Priority: 1.   Status post surgery 07/22/2023 08/13/2023     Discharge Instructions     Discharge wound care:   Complete by: As directed    Coccyx: Apply sacral foam dressing, change every 3 days or PRN  Left Heel: Cleanse L heel wound with NS, apply Bacitracin  ointment to wound bed daily, cover with Telfa nonstick dressing and secure with silicone foam or Kerlix roll gauze whichever is preferred   Increase activity slowly   Complete by: As directed       Allergies as of 08/14/2023       Reactions   Dilaudid [hydromorphone Hcl] Other (See Comments)   Respiratory arrest        Medication List     STOP taking these medications    aspirin  81 MG tablet   carbamazepine  200 MG 12 hr capsule Commonly known as: CARBATROL    carbamazepine  200 MG 12 hr tablet Commonly known as: TEGRETOL  XR Replaced by: carbamazepine  200 MG tablet   DULoxetine  60 MG capsule Commonly known as: CYMBALTA    metoprolol  succinate 25 MG 24 hr tablet Commonly known as: TOPROL -XL   ondansetron  4 MG tablet Commonly known as: ZOFRAN    ondansetron  8 MG disintegrating tablet Commonly known as: ZOFRAN -ODT       TAKE these medications    acetaminophen  500 MG tablet Commonly known as: TYLENOL  Take 500-1,000 mg by mouth every 6 (six) hours as needed for moderate pain (pain score 4-6).   atorvastatin  10 MG tablet Commonly known as: LIPITOR Take 1 tablet (10 mg total) by mouth at bedtime. What changed:  how much to take when to take this   bacitracin  ointment Apply topically daily. Cleanse L heel wound with NS, apply Bacitracin  ointment to wound bed daily, cover with Telfa nonstick dressing and secure with silicone foam or Kerlix roll gauze whichever is preferred   carbamazepine  200 MG tablet Commonly known as: TEGRETOL  Take 1 tablet (200 mg total) by mouth 3 (three) times daily. Replaces: carbamazepine  200 MG 12 hr tablet   carbidopa -levodopa  25-100 MG  tablet Commonly known as: SINEMET  IR Take 1 tablet by mouth 3 (three) times daily.   clonazePAM 0.5 MG tablet Commonly known as: KLONOPIN Take 0.5 tablets (0.25 mg total) by mouth daily as needed for anxiety.   diphenhydrAMINE  25 MG tablet Commonly known as: BENADRYL  Take 2 tablets (50 mg total) by mouth at bedtime. What changed: how much to take   doxazosin  4 MG tablet Commonly known as: CARDURA  Take 4 mg by mouth daily.   ergocalciferol 1.25 MG (50000 UT) capsule Commonly known as: VITAMIN D2 Take 50,000 Units by mouth once a week.   ferrous sulfate 325 (65 FE) MG tablet Take 1 tablet (325 mg total) by mouth daily with breakfast.   fluticasone  50 MCG/ACT nasal spray Commonly known as: FLONASE  Place 2 sprays into both nostrils daily as needed for allergies.   folic acid 1 MG tablet Commonly known as: FOLVITE Take 1 tablet (1 mg total) by mouth daily.   gabapentin  300 MG capsule Commonly known as: NEURONTIN  Take 300 mg by mouth at  bedtime.   hydrocortisone  cream 1 % Apply 1 Application topically 3 (three) times daily as needed for itching.   hydroxypropyl methylcellulose / hypromellose 2.5 % ophthalmic solution Commonly known as: ISOPTO TEARS / GONIOVISC Place 2 drops into both eyes 4 (four) times daily as needed for dry eyes.   Melatonin 10 MG Tabs Take 10 mg by mouth at bedtime. What changed:  medication strength how much to take   metFORMIN  500 MG 24 hr tablet Commonly known as: GLUCOPHAGE -XR Take 500 mg by mouth daily.   metoprolol  tartrate 25 MG tablet Commonly known as: LOPRESSOR  Take 0.5 tablets (12.5 mg total) by mouth 2 (two) times daily.   modafinil  100 MG tablet Commonly known as: PROVIGIL  Take 1 tablet (100 mg total) by mouth daily.   montelukast  10 MG tablet Commonly known as: SINGULAIR  Take 10 mg by mouth at bedtime.   omeprazole 20 MG capsule Commonly known as: PRILOSEC Take 20 mg by mouth daily.   polyethylene glycol 17 g  packet Commonly known as: MIRALAX  / GLYCOLAX  Take 17 g by mouth daily as needed.   sennosides 8.8 MG/5ML syrup Commonly known as: SENOKOT Take 5 mLs by mouth at bedtime.   tamsulosin  0.4 MG Caps capsule Commonly known as: FLOMAX  Take 0.4 mg by mouth daily after breakfast.   traMADol  50 MG tablet Commonly known as: ULTRAM  Take 1 tablet (50 mg total) by mouth every 8 (eight) hours as needed for severe pain (pain score 7-10).               Discharge Care Instructions  (From admission, onward)           Start     Ordered   08/13/23 0000  Discharge wound care:       Comments: Coccyx: Apply sacral foam dressing, change every 3 days or PRN  Left Heel: Cleanse L heel wound with NS, apply Bacitracin  ointment to wound bed daily, cover with Telfa nonstick dressing and secure with silicone foam or Kerlix roll gauze whichever is preferred   08/13/23 1158            Contact information for after-discharge care     Destination     [facility] .   Service: Skilled Nursing Contact information: [address] [place]  [address] [phone]                    Allergies  Allergen Reactions   Dilaudid [Hydromorphone Hcl] Other (See Comments)    Respiratory arrest     Discharge Exam: BP (!) 107/53 (BP Location: Left Arm)   Pulse 73   Temp 98.7 F (37.1 C) (Oral)   Resp 17   Ht 6' (1.829 m)   Wt 101.1 kg   SpO2 95%   BMI 30.23 kg/m  Physical Exam Constitutional:      General: He is not in acute distress.    Appearance: Normal appearance.     Comments: More awake and talking today.   HENT:     Head:     Comments: Right surgical scar noted with prior craniotomy     Mouth/Throat:     Mouth: Mucous membranes are moist.   Eyes:     Extraocular Movements: Extraocular movements intact.    Cardiovascular:     Rate and Rhythm: Normal rate and regular rhythm.  Pulmonary:      Effort: Pulmonary effort is normal. No respiratory distress.  Breath sounds: Normal breath sounds. No wheezing.  Abdominal:     General: Bowel sounds are normal. There is no distension.     Palpations: Abdomen is soft.     Tenderness: There is no abdominal tenderness.   Musculoskeletal:        General: Normal range of motion.     Cervical back: Normal range of motion and neck supple.   Skin:    General: Skin is warm and dry.   Neurological:     Mental Status: He is alert.     Comments: Moves all 4 extremities much easier; no contractions when awake. Follows all commands easily      The results of significant diagnostics from this hospitalization (including imaging, microbiology, ancillary and laboratory) are listed below for reference.   Microbiology: No results found for this or any previous visit (from the past 240 hours).   Labs: BNP (last 3 results) No results for input(s): BNP in the last 8760 hours. Basic Metabolic Panel: Recent Labs  Lab 08/08/23 1100 08/08/23 1822 08/09/23 1124 08/10/23 0711 08/11/23 0436 08/12/23 0853  NA 138  --  138 140 140 140  K 6.0* 5.3* 5.2* 4.7 4.7 4.5  CL 106  --  107 107 110 111  CO2 25  --  23 24 24  19*  GLUCOSE 119*  --  217* 128* 93 182*  BUN 68*  --  60* 54* 45* 37*  CREATININE 1.62*  --  1.68* 1.66* 1.61* 1.48*  CALCIUM  8.3*  --  8.2* 8.3* 8.3* 8.4*  MG 2.9*  --   --   --   --   --    Liver Function Tests: No results for input(s): AST, ALT, ALKPHOS, BILITOT, PROT, ALBUMIN  in the last 168 hours. No results for input(s): LIPASE, AMYLASE in the last 168 hours. No results for input(s): AMMONIA in the last 168 hours. CBC: Recent Labs  Lab 08/08/23 1100 08/09/23 1124 08/10/23 0711 08/11/23 0436 08/12/23 0853  WBC 6.2 5.7 4.4 4.1 4.7  NEUTROABS 4.0  --   --   --   --   HGB 8.0* 7.6* 7.8* 7.9* 8.2*  HCT 25.9* 24.7* 25.4* 25.6* 26.5*  MCV 100.4* 99.2 101.2* 100.8* 102.3*  PLT 220 207 198 182 176    Cardiac Enzymes: No results for input(s): CKTOTAL, CKMB, CKMBINDEX, TROPONINI in the last 168 hours. BNP: Invalid input(s): POCBNP CBG: Recent Labs  Lab 08/13/23 1535 08/13/23 2051 08/14/23 0027 08/14/23 0353 08/14/23 0750  GLUCAP 239* 115* 105* 111* 109*   D-Dimer No results for input(s): DDIMER in the last 72 hours. Hgb A1c No results for input(s): HGBA1C in the last 72 hours. Lipid Profile No results for input(s): CHOL, HDL, LDLCALC, TRIG, CHOLHDL, LDLDIRECT in the last 72 hours. Thyroid  function studies No results for input(s): TSH, T4TOTAL, T3FREE, THYROIDAB in the last 72 hours.  Invalid input(s): FREET3 Anemia work up No results for input(s): VITAMINB12, FOLATE, FERRITIN, TIBC, IRON, RETICCTPCT in the last 72 hours. Urinalysis    Component Value Date/Time   COLORURINE YELLOW 08/11/2023 1601   APPEARANCEUR CLEAR 08/11/2023 1601   APPEARANCEUR Clear 06/22/2014 2230   LABSPEC 1.017 08/11/2023 1601   LABSPEC 1.028 06/22/2014 2230   PHURINE 5.0 08/11/2023 1601   GLUCOSEU NEGATIVE 08/11/2023 1601   GLUCOSEU Negative 06/22/2014 2230   HGBUR NEGATIVE 08/11/2023 1601   BILIRUBINUR NEGATIVE 08/11/2023 1601   BILIRUBINUR Negative 06/22/2014 2230   KETONESUR NEGATIVE 08/11/2023 1601   PROTEINUR NEGATIVE 08/11/2023 1601  NITRITE NEGATIVE 08/11/2023 1601   LEUKOCYTESUR NEGATIVE 08/11/2023 1601   LEUKOCYTESUR Negative 06/22/2014 2230   Sepsis Labs Recent Labs  Lab 08/09/23 1124 08/10/23 0711 08/11/23 0436 08/12/23 0853  WBC 5.7 4.4 4.1 4.7   Microbiology No results found for this or any previous visit (from the past 240 hours).  Procedures/Studies: DG Abd 1 View Result Date: 08/11/2023 CLINICAL DATA:  Abdominal pain EXAM: ABDOMEN - 1 VIEW COMPARISON:  None Available. FINDINGS: The bowel gas pattern is normal. No radio-opaque calculi or other significant radiographic abnormality are seen. IMPRESSION: Negative. No  obstruction residual contrast throughout the distal colon Electronically Signed   By: [name] M.D.   On: 08/11/2023 11:00   DG CHEST PORT 1 VIEW Result Date: 08/11/2023 CLINICAL DATA:  Wheezing EXAM: PORTABLE CHEST 1 VIEW COMPARISON:  July 31, 2023 FINDINGS: Improving pulmonary infiltrates with residual left lower lobe retrocardiac atelectasis without consolidations or pleural effusions Heart normal size IMPRESSION: Improving pulmonary infiltrates with residual left lower lobe retrocardiac atelectasis. Electronically Signed   By: [name] M.D.   On: 08/11/2023 10:59   CT HEAD WO CONTRAST ( ) Result Date: 08/10/2023 EXAM: CT HEAD WITHOUT CONTRAST 08/10/2023 12:05:10 PM TECHNIQUE: CT of the head was performed without the administration of intravenous contrast. Automated exposure control, iterative reconstruction, and/or weight based adjustment of the mA/kV was utilized to reduce the radiation dose to as low as reasonably achievable. COMPARISON: CT head without contrast 07/26/2023. CT head without contrast 07/25/2023. CLINICAL HISTORY: Assess for subdural hematoma enlargement. FINDINGS: BRAIN AND VENTRICLES: Right frontal craniotomy is again noted. Expected evolution of the extra-axial collection is noted. Decreased density is present within the collection with some layering posteriorly. Persistent mass effect is present with effacement of the sulci. Midline shift of 6 mm is similar to the prior exam. No new hemorrhage is present. No cortical infarct is present. ORBITS: Bilateral lens replacements are noted. The globes and orbits are otherwise within normal limits. SINUSES: The visualized paranasal sinuses and mastoid air cells demonstrate no acute abnormality. SOFT TISSUES AND SKULL: No acute abnormality of the visualized skull or soft tissues. VASCULATURE: Atherosclerotic changes are present within the cavernous internal carotid arteries bilaterally. No hyperdense vessel is present. IMPRESSION: 1.  Expected evolution of the extra-axial collection with decreased density and layering posteriorly. Persistent mass effect with effacement of the sulci and midline shift of 6 mm, similar to the prior exam. 2. No new hemorrhage or cortical infarct. Electronically signed by: Audree Leas MD 08/10/2023 12:15 PM EDT RP Workstation: [id number]   DG Swallowing Func-Speech Pathology Result Date: 08/05/2023 Table formatting from the original result was not included. Modified Barium Swallow Study Patient Details Name: JAIYDEN LAUR MRN: [id number] Date of Birth: Mar 24, 1940 Today's Date: 08/05/2023 HPI/PMH: HPI: 3 yoM with PMH as below, significant for HTN, HLD, DMT2, possible parkinsons disease, trigeminal neuralgia, CKD 3a, and BPH, who presented after mechanical fall on 5/27, not on AC or antiplatelets who developed N/V, AMS, with left sided weakness found to have large right SDH with 1.35m right to left MLS.  Admitted to NSGY and taken for emergent right craniotomy.  Was seen by PCCM while in Neuro ICU, extubated 5/27 evening.  ICU stay complicated by hypoactive delirium, cortrak placed for meds/ nutrition.  pt transferred out of ICU 5/30, CTH unchanged 5/31.  Started to develop increased fever curve.  On 6/1 PCCM re-consulted for development on hypoxia, previously on room air with upper airway congestion.  Daughter reports has not been  coughing and clearing secretion. Clinical Impression: Clinical Impression: Pt has an oral dysphagia with esophageal component, but his pharyngeal phase is generally intact. Oral phase is marked by reduced labial seal and disorganized lingual transit. There is anterior loss, intermittent premature spillage, and oral residue that is mostly anteriorly located on the floor of his mouth and his bilateral buccal cavities. Mastication is slow with rest breaks (also question potential impact from loosely fitting lower dentures). Pharyngeal function was appropriate across all consistencies  tested with no aspiration and no pharyngeal residue until the barium tablet, which was transiently lodged in the valleculae and then the UES. Once it entered into the esophagus it remained in the proximal portion of the esophagus, not clearing with multiple trials of purees and thin liquids. It ultimately moved down to his distal esophagus when given sips of warm water . Note that other barium was present in the esophagus underneath the pill with some retrograde flow observed, but it did appear to clear more by the time the pill had descended more. Upon completion of the study, after being transferred back to bed, pt did have regurgitation of the barium he had just consumed. Question if esophageal component could have contributed although his esophagus had been mostly cleared prior to laying him back. His daughter was present for the study and notes that he had been reporting nausea this morning. Recommend starting slowly with clear liquid diet (thin liquids). If he can tolerate this well, anticipate that he will be able to advance up to Dys 2 (finely chopped) diet with use of aspiration and esophageal precautions. Would crush PO medications. RN and MD both updated on recommendations as well. Factors that may increase risk of adverse event in presence of aspiration Roderick Civatte & Jessy Morocco 2021): Factors that may increase risk of adverse event in presence of aspiration Roderick Civatte & Jessy Morocco 2021): Respiratory or GI disease; Reduced cognitive function; Limited mobility; Dependence for feeding and/or oral hygiene Recommendations/Plan: Swallowing Evaluation Recommendations Swallowing Evaluation Recommendations Recommendations: PO diet PO Diet Recommendation: Clear liquid diet; Thin liquids (Level 0) Liquid Administration via: Cup; Straw Medication Administration: Crushed with puree Supervision: Staff to assist with self-feeding; Full supervision/cueing for swallowing strategies Swallowing strategies  : Slow rate; Small bites/sips;  Check for anterior loss; Follow solids with liquids Postural changes: Position pt fully upright for meals; Stay upright 30-60 min after meals Oral care recommendations: Oral care BID (2x/day) Caregiver Recommendations: Have oral suction available Treatment Plan Treatment Plan Treatment recommendations: Therapy as outlined in treatment plan below Follow-up recommendations: Skilled nursing-short term rehab (<3 hours/day) Functional status assessment: Patient has had a recent decline in their functional status and demonstrates the ability to make significant improvements in function in a reasonable and predictable amount of time. Treatment frequency: Min 2x/week Treatment duration: 2 weeks Interventions: Aspiration precaution training; Compensatory techniques; Patient/family education; Trials of upgraded texture/liquids; Diet toleration management by SLP Recommendations Recommendations for follow up therapy are one component of a multi-disciplinary discharge planning process, led by the attending physician.  Recommendations may be updated based on patient status, additional functional criteria and insurance authorization. Assessment: Orofacial Exam: Orofacial Exam Oral Cavity - Dentition: Dentures, top; Dentures, bottom (bottom dentures loose) Anatomy: Anatomy: Suspected cervical osteophytes; Other (Comment) (naturally positioned in more of a chin tucked position) Boluses Administered: Boluses Administered Boluses Administered: Thin liquids (Level 0); Mildly thick liquids (Level 2, nectar thick); Puree; Solid  Oral Impairment Domain: Oral Impairment Domain Lip Closure: Escape beyond mid-chin Tongue control during bolus hold: Posterior escape  of greater than half of bolus Bolus preparation/mastication: Slow prolonged chewing/mashing with complete recollection Bolus transport/lingual motion: Repetitive/disorganized tongue motion Oral residue: Residue collection on oral structures Location of oral residue : Floor of  mouth; Tongue; Lateral sulci Initiation of pharyngeal swallow : Pyriform sinuses (but mostly above the valleculae)  Pharyngeal Impairment Domain: Pharyngeal Impairment Domain Soft palate elevation: No bolus between soft palate (SP)/pharyngeal wall (PW) Laryngeal elevation: Complete superior movement of thyroid  cartilage with complete approximation of arytenoids to epiglottic petiole Anterior hyoid excursion: Complete anterior movement Epiglottic movement: Complete inversion Laryngeal vestibule closure: Complete, no air/contrast in laryngeal vestibule Pharyngeal stripping wave : Present - complete Pharyngeal contraction (A/P view only): N/A Pharyngoesophageal segment opening: Complete distension and complete duration, no obstruction of flow Tongue base retraction: No contrast between tongue base and posterior pharyngeal wall (PPW) Pharyngeal residue: Complete pharyngeal clearance Location of pharyngeal residue: N/A  Esophageal Impairment Domain: Esophageal Impairment Domain Esophageal clearance upright position: Esophageal retention with retrograde flow through the PES Pill: Pill Consistency administered: Thin liquids (Level 0); Puree Thin liquids (Level 0): Impaired (see clinical impressions) Puree: Impaired (see clinical impressions) Penetration/Aspiration Scale Score: Penetration/Aspiration Scale Score 1.  Material does not enter airway: Solid; Puree; Pill 2.  Material enters airway, remains ABOVE vocal cords then ejected out: Thin liquids (Level 0); Mildly thick liquids (Level 2, nectar thick) Compensatory Strategies: Compensatory Strategies Compensatory strategies: No   General Information: Caregiver present: Yes (dtr, Sharon)  Diet Prior to this Study: NPO; Cortrak/Small bore NG tube   Temperature : Normal   Respiratory Status: WFL   Supplemental O2: None (Room air)   History of Recent Intubation: Yes  Behavior/Cognition: Alert; Cooperative; Pleasant mood; Requires cueing Self-Feeding Abilities: Dependent for  feeding Baseline vocal quality/speech: Normal No data recorded No data recorded Exam Limitations: No limitations Goal Planning: Prognosis for improved oropharyngeal function: Good Barriers to Reach Goals: Cognitive deficits No data recorded Patient/Family Stated Goal: family asking for POs for patient Consulted and agree with results and recommendations: Patient; Family member/caregiver; Physician; Nurse (dtr, Genevia Kern, present for study) Pain: Pain Assessment Pain Assessment: Faces Faces Pain Scale: 0 Facial Expression: 0 Body Movements: 0 Muscle Tension: 0 Compliance with ventilator (intubated pts.): N/A Vocalization (extubated pts.): 0 CPOT Total: 0 Pain Location: Moans with BLE movement Pain Descriptors / Indicators: Moaning Pain Intervention(s): Monitored during session End of Session: Start Time:SLP Start Time (ACUTE ONLY): 1344 Stop Time: SLP Stop Time (ACUTE ONLY): 1430 Time Calculation:SLP Time Calculation (min) (ACUTE ONLY): 46 min Charges: SLP Evaluations $ SLP Speech Visit: 1 Visit SLP Evaluations $MBS Swallow: 1 Procedure $Swallowing Treatment: 1 Procedure SLP visit diagnosis: SLP Visit Diagnosis: Dysphagia, unspecified (R13.10) Past Medical History: Past Medical History: Diagnosis Date  Acute cholecystitis 05/21/14  BP (high blood pressure) 10/14/2014  Chronic kidney disease (CKD), stage III (moderate) (HCC) 02/15/2014  Colon perforation (HCC) 05/28/2014  Diabetes mellitus without complication (HCC)   [name]'s neuralgia 10/14/2014  Headache   Hypertension   Intractable nausea and vomiting 04/07/2023  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 Luise Saint  COLOSTOMY TAKEDOWN N/A 10/26/2014  Procedure: COLOSTOMY TAKEDOWN;  Surgeon: Rhina Center III, MD;  Location: [facility] ORS;  Service: General;  Laterality: N/A;  CRANIOTOMY Right 07/21/2023  Procedure: CRANIOTOMY HEMATOMA EVACUATION SUBDURAL;  Surgeon: Augusto Blonder, MD;   Location: [facility] OR;  Service: Neurosurgery;  Laterality: Right;  EXPLORATORY LAPAROTOMY  05/28/2014  Dr. Luise Saint  LAPAROSCOPIC CHOLECYSTECTOMY  05/21/2014  Dr.  Arthor Billet., M.A. CCC-SLP Acute Rehabilitation Services Office: [phone] Secure chat preferred 08/05/2023, 3:30 PM  DG Chest Port 1 View Result Date: 07/31/2023 CLINICAL DATA:  Aspiration pneumonia EXAM: PORTABLE CHEST 1 VIEW COMPARISON:  July 29, 2023 FINDINGS: No consolidating changes to indicate aspiration pneumonia however, prominence of the interstitial markings could correlate with congestive changes with mild cardiomegaly. Small right pleural reaction or effusion. Feeding tube in the stomach. IMPRESSION: No consolidating changes to indicate aspiration pneumonia however, prominence of the interstitial markings could correlate with congestive changes with mild cardiomegaly. Electronically Signed   By: [name] M.D.   On: 07/31/2023 08:45   DG Chest Port 1 View Result Date: 07/29/2023 CLINICAL DATA:  Pneumonia EXAM: PORTABLE CHEST 1 VIEW COMPARISON:  Chest radiograph dated 07/27/2023 FINDINGS: Lines/tubes: Enteric tube tip reaches the diaphragm and terminates below the field of view. Lungs: Low lung volumes with bronchovascular crowding. Increased diffuse interstitial and patchy opacities. Pleura: No pneumothorax or pleural effusion. Heart/mediastinum: Similar enlarged cardiomediastinal silhouette. Bones: No acute osseous abnormality. IMPRESSION: Low lung volumes with bronchovascular crowding. Increased diffuse interstitial and patchy opacities, which may represent pulmonary edema or multifocal pneumonia. Electronically Signed   By: Limin  Xu M.D.   On: 07/29/2023 08:24   CT Angio Chest Pulmonary Embolism (PE) W or WO Contrast Result Date: 07/27/2023 CLINICAL DATA:  High probability for PE. EXAM: CT ANGIOGRAPHY CHEST WITH CONTRAST TECHNIQUE: Multidetector CT imaging of the chest was performed using the standard protocol during bolus  administration of intravenous contrast. Multiplanar CT image reconstructions and MIPs were obtained to evaluate the vascular anatomy. RADIATION DOSE REDUCTION: This exam was performed according to the departmental dose-optimization program which includes automated exposure control, adjustment of the mA and/or kV according to patient size and/or use of iterative reconstruction technique. CONTRAST:  75mL OMNIPAQUE  IOHEXOL  350 MG/ML SOLN COMPARISON:  CT of the chest 05/18/2014. FINDINGS: Cardiovascular: Satisfactory opacification of the pulmonary arteries to the segmental level. No evidence of pulmonary embolism. The heart is enlarged. There is no pericardial effusion. There are atherosclerotic calcifications of the aorta. Mediastinum/Nodes: Enteric tube is seen throughout nondilated esophagus. Visualized thyroid  gland is within normal limits. No enlarged lymph nodes are seen. Lungs/Pleura: There are small bilateral pleural effusions, left greater than right. There is compressive atelectasis in the bilateral lower lobes. There are small multifocal areas of ground-glass opacities in the bilateral upper lobes and right middle lobe. There is atelectasis in the right middle lobe. Trachea and central airways are patent. Upper Abdomen: No acute abnormality. There are atherosclerotic calcifications of the aorta. Enteric tube tip ends in the gastric antrum Musculoskeletal: Degenerative changes affect the spine. Review of the MIP images confirms the above findings. IMPRESSION: 1. No evidence for pulmonary embolism. 2. Small bilateral pleural effusions, left greater than right. 3. Small multifocal areas of ground-glass opacities in the bilateral upper lobes and right middle lobe, worrisome for infectious/inflammatory process. 4. Cardiomegaly. 5. Aortic atherosclerosis. Aortic Atherosclerosis (ICD10-I70.0). Electronically Signed   By: Tyron Gallon M.D.   On: 07/27/2023 18:36   DG CHEST PORT 1 VIEW Result Date:  07/27/2023 CLINICAL DATA:  Low-grade fever EXAM: PORTABLE CHEST 1 VIEW COMPARISON:  07/22/2023 FINDINGS: Cardiac shadow is stable. Feeding catheter extends into the stomach. Endotracheal tube has been removed in the interval. The lungs are well aerated bilaterally. Mild central vascular congestion is noted. No edema is seen. IMPRESSION: Changes of mild CHF. Electronically Signed   By: Violeta Grey M.D.   On: 07/27/2023 09:54  CT HEAD WO CONTRAST ( ) Result Date: 07/26/2023 CLINICAL DATA:  Initial evaluation for acute mental status change, unknown cause. EXAM: CT HEAD WITHOUT CONTRAST TECHNIQUE: Contiguous axial images were obtained from the base of the skull through the vertex without intravenous contrast. RADIATION DOSE REDUCTION: This exam was performed according to the departmental dose-optimization program which includes automated exposure control, adjustment of the mA and/or kV according to patient size and/or use of iterative reconstruction technique. COMPARISON:  CT from 07/25/2023 FINDINGS: Brain: Examination markedly degraded by motion artifact, limiting assessment. Postoperative changes from prior right frontal craniotomy for subdural evacuation. Residual subdural hematoma measures up to 1.5 cm in thickness. Extension along the posterior falx and right tentorium, similar to prior. Associated mass effect on the subjacent right cerebral hemisphere with 5-6 mm of right-to-left shift, similar. Smaller left-sided subdural hematoma measures up to 3 mm in thickness without significant mass effect, also relatively stable. No evidence for significant interval bleeding since prior. No other new acute intracranial hemorrhage. No visible acute large vessel territory infarct. No mass lesion or hydrocephalus. Basilar cisterns remain patent. Vascular: No visible abnormal hyperdense vessel. Calcified atherosclerosis present at the skull base. Skull: Post craniotomy changes on the right. Skin staples remain in place.  No adverse features. Sinuses/Orbits: Globes orbital soft tissues within normal limits. Scattered mucoperiosteal thickening present throughout the paranasal sinuses. Nasogastric tube in place. Mastoid air cells remain clear. Other: None. IMPRESSION: 1. Motion degraded exam. 2. Postoperative changes from prior right frontal craniotomy for subdural evacuation. Residual subdural hematoma measures up to 1.5 cm in thickness with associated mass effect on the subjacent right cerebral hemisphere and 5-6 mm of right-to-left shift. Smaller left-sided subdural hematoma without significant mass effect. Overall, changes are not significantly changed from prior. 3. No other new acute intracranial abnormality. Electronically Signed   By: Virgia Griffins M.D.   On: 07/26/2023 22:08   CT HEAD WO CONTRAST ( ) Result Date: 07/25/2023 CLINICAL DATA:  83 year old male postoperative day 3 status post right side craniotomy for subdural evacuation. EXAM: CT HEAD WITHOUT CONTRAST TECHNIQUE: Contiguous axial images were obtained from the base of the skull through the vertex without intravenous contrast. RADIATION DOSE REDUCTION: This exam was performed according to the departmental dose-optimization program which includes automated exposure control, adjustment of the mA and/or kV according to patient size and/or use of iterative reconstruction technique. COMPARISON:  07/23/2023 and earlier. FINDINGS: Brain: Right subdural drain removed. Residual mixed density but mostly hyperdense right side subdural hematoma with mild additional regression, now up to 12 mm thickness along the right anterior frontal convexity. Small contralateral left superior convexity subdural remains isodense and subtle. Small volume para falcine and tentorial blood is stable. Further decreased mass effect on the right lateral ventricle. Midline shift has decreased since the 1st postoperative CT, now 5 mm (9-10 mm initially). No ventriculomegaly. Patent basilar  cisterns. Stable gray-white.  No cortically based acute infarct identified. Vascular: Calcified atherosclerosis at the skull base. No suspicious intracranial vascular hyperdensity. Skull: Stable right frontotemporal craniotomy. Sinuses/Orbits: Left nasoenteric tube now in place. Visualized paranasal sinuses and mastoids are stable and well aerated. Other: Percutaneous drain removed, otherwise stable right scalp postoperative changes. Stable orbits soft tissues. IMPRESSION: 1. Right subdural drain removed. Mild additional regression of Right Subdural Hematoma, now up to 12 mm thickness. Contralateral small isodense Left SDH is stable. 2. Decreasing postoperative leftward midline shift, now 5 mm. 3. No new intracranial abnormality. Electronically Signed   By: Marlise Simpers M.D.   On:  07/25/2023 10:55   CT HEAD WO CONTRAST ( ) Result Date: 07/23/2023 CLINICAL DATA:  83 year old male with right side subdural hematoma, postoperative day 1 craniotomy and evacuation. EXAM: CT HEAD WITHOUT CONTRAST TECHNIQUE: Contiguous axial images were obtained from the base of the skull through the vertex without intravenous contrast. RADIATION DOSE REDUCTION: This exam was performed according to the departmental dose-optimization program which includes automated exposure control, adjustment of the mA and/or kV according to patient size and/or use of iterative reconstruction technique. COMPARISON:  Postoperative head CT yesterday and earlier. FINDINGS: Brain: Right side subdural drain remains in place. Mixed density right side subdural hematoma has further decreased, ranging from about 6 mm thickness over the right posterior convexity (previously 11 mm) to 14 mm over the more anterior right cerebral convexity (up to 16 mm yesterday). Corresponding, the intracranial mass effect and leftward midline shift have decreased. Leftward midline shift now is 6-7 mm (9-10 mm yesterday). And right lateral ventricle patency has improved. Left lateral  ventricle remains stable. Much smaller mixed density left side subdural hematoma now measures 2-3 mm at most levels and appears smaller (coronal image 45). A small volume of mostly right side para falcine and tentorial blood has not significantly changed. Basilar cisterns are further improved, particularly the suprasellar cistern. There is a small volume of subdural blood suspected at the ventral foramen magnum, stable. Stable gray-white matter differentiation throughout the brain. No cortically based acute infarct identified. Vascular: Calcified atherosclerosis at the skull base. No suspicious intracranial vascular hyperdensity. Skull: Stable right side craniotomy. Sinuses/Orbits: Visualized paranasal sinuses and mastoids are stable and well aerated. Other: Stable right scalp postoperative changes, skin staples, percutaneous drain. IMPRESSION: 1. Further decreased right side Subdural Hematoma (maximal 14 mm now), intracranial mass effect, and leftward midline shift (6-7 mm now) since yesterday. Right subdural drain remains in place. 2. Stable to decreased much smaller Left side Subdural Hematoma (2-3 mm). 3. No new intracranial abnormality. Electronically Signed   By: Marlise Simpers M.D.   On: 07/23/2023 05:50   DG CHEST PORT 1 VIEW Result Date: 07/22/2023 CLINICAL DATA:  Ventilator dependence. EXAM: PORTABLE CHEST 1 VIEW COMPARISON:  07/21/2023 FINDINGS: Endotracheal tube tip is 3 cm above the base of the carina. The NG tube passes into the stomach although the distal tip position is not included on the film. The cardio pericardial silhouette is enlarged. Vascular congestion without overt edema. Probable trace atelectasis at the lung bases without substantial effusion. Telemetry leads overlie the chest. IMPRESSION: 1. Endotracheal tube tip is 3 cm above the base of the carina. 2. Vascular congestion without overt edema. Electronically Signed   By: Donnal Fusi M.D.   On: 07/22/2023 07:39   CT HEAD WO  CONTRAST Result Date: 07/22/2023 CLINICAL DATA:  83 year old male with right side subdural hematoma, code stroke with left-sided weakness. Postoperative day zero right frontoparietal craniotomy and evacuation. EXAM: CT HEAD WITHOUT CONTRAST TECHNIQUE: Contiguous axial images were obtained from the base of the skull through the vertex without intravenous contrast. RADIATION DOSE REDUCTION: This exam was performed according to the departmental dose-optimization program which includes automated exposure control, adjustment of the mA and/or kV according to patient size and/or use of iterative reconstruction technique. COMPARISON:  Head CT 07/21/23 at 2315 hours. FINDINGS: Brain: New postoperative changes. Ongoing mixed density right side subdural hematoma with a subdural space drain now in place. Residual blood products plus hemostatic material in the right subdural space now measure up to 16 mm in thickness on coronal  images (coronal image 42). Versus greater than 20 mm last night (coronal image 46 of that exam). Leftward midline shift now about 10 mm (versus 16-18 mm at the same level preoperatively. Ventricle size and configuration not significantly changed. Mildly improved basilar cisterns. Suspicion now of a smaller mixed density left side subdural hematoma (coronal image 41) 3-4 mm at most levels. Right side predominant para falcine blood, right tentorial blood. No IVH. No SAH identified. No intra-axial hemorrhage identified. Stable gray-white matter differentiation throughout the brain. Vascular: Calcified atherosclerosis at the skull base. No suspicious intracranial vascular hyperdensity. Skull: New right side craniotomy.  Otherwise intact. Sinuses/Orbits: Stable paranasal sinus mucoperiosteal thickening. Tympanic cavities and mastoids remain well aerated. Other: New postoperative changes to the right scalp with skin staples and percutaneous strain in place. Stable orbits soft tissues. IMPRESSION: 1. Large mixed  density Right SDH decreased to 16 mm thickness on this early postoperative scan. 2. Much smaller mixed density Left side SDH is now apparent, 3-4 mm. 3. Decreased leftward midline shift, now 16 mm. Stable ventricle size and configuration. Slightly improved basilar cisterns. 4. Stable gray-white differentiation, no other new intracranial abnormality. Electronically Signed   By: Marlise Simpers M.D.   On: 07/22/2023 05:50   DG Chest Portable 1 View Result Date: 07/22/2023 CLINICAL DATA:  Status post intubation. EXAM: PORTABLE CHEST 1 VIEW COMPARISON:  April 07, 2023 FINDINGS: An endotracheal tube is seen with its distal tip noted at the entrance of the right mainstem bronchus. An enteric tube is also seen with its distal end extending into the stomach. The cardiac silhouette is mildly enlarged and unchanged in size. Low lung volumes are seen. Mild atelectatic changes are suspected within the retrocardiac region of the left lung base. No pleural effusion or pneumothorax is identified. Multilevel degenerative changes seen throughout the thoracic spine. IMPRESSION: 1. Endotracheal tube with its distal tip noted at the entrance of the right mainstem bronchus. Retraction of the endotracheal tube by approximately 3 cm is recommended. 2. Low lung volumes with mild left basilar atelectasis. Electronically Signed   By: Virgle Grime M.D.   On: 07/22/2023 00:11   DG Abd 1 View Result Date: 07/22/2023 CLINICAL DATA:  Confirm nasogastric tube placement. EXAM: ABDOMEN - 1 VIEW COMPARISON:  June 07, 2014 FINDINGS: A nasogastric tube is seen with its distal tip overlying the expected region of the duodenal bulb. The bowel gas pattern is normal. Radiopaque surgical clips are seen within the right upper quadrant. No radio-opaque calculi or other significant radiographic abnormality are seen. IMPRESSION: Nasogastric tube positioning, as described above. Electronically Signed   By: Virgle Grime M.D.   On: 07/22/2023 00:08    CT HEAD CODE STROKE WO CONTRAST Result Date: 07/21/2023 CLINICAL DATA:  Code stroke.  Left-sided weakness EXAM: CT HEAD WITHOUT CONTRAST TECHNIQUE: Contiguous axial images were obtained from the base of the skull through the vertex without intravenous contrast. RADIATION DOSE REDUCTION: This exam was performed according to the departmental dose-optimization program which includes automated exposure control, adjustment of the mA and/or kV according to patient size and/or use of iterative reconstruction technique. COMPARISON:  None Available. FINDINGS: Brain: There is a large mixed density right holohemispheric subdural hematoma that measures up to 23 mm in thickness. There is 16 mm of leftward midline shift with subfalcine herniation of the right cingulate gyrus. There is impending herniation of the right uncus. The right lateral ventricle is effaced. No hydrocephalus. Vascular: There is atherosclerotic calcification of both internal carotid arteries at  the skull base. Skull: Negative Sinuses/Orbits: Negative Other: None IMPRESSION: 1. Large mixed density right holohemispheric subdural hematoma measuring up to 23 mm in thickness with 16 mm of leftward midline shift and subfalcine herniation of the right cingulate gyrus. 2. Impending herniation of the right uncus. Critical Value/emergent results were called by telephone at the time of interpretation on 07/21/2023 at 11:22 pm to provider [facility] , who verbally acknowledged these results. Electronically Signed   By: Juanetta Nordmann M.D.   On: 07/21/2023 23:22     Time coordinating discharge: Over 30 minutes    Faith Homes, MD  [facility] 08/14/2023, 11:00 AM

## 2023-08-14 NOTE — Progress Notes (Signed)
 DISCHARGE NOTE [facility] to be discharged Skilled nursing facility per MD order. Patient verbalized understanding.  Skin clean, dry and intact without evidence of skin break down, no evidence of skin tears noted. IV catheter discontinued intact. Site without signs and symptoms of complications. Dressing and pressure applied. Pt denies pain at the site currently. No complaints noted.  Patient free of lines, drains, and wounds.   Discharge packet assembled. An After Visit Summary (AVS) was printed and given to the EMS personnel. Patient escorted via stretcher and discharged to [facility] via ambulance. Report called to accepting facility; all questions and concerns addressed.   Kalesha Irving K Terry Abila, RN

## 2023-08-14 NOTE — Progress Notes (Signed)
 Report called to [facility], Charity fundraiser at [employer]. All questions anwered.

## 2023-08-15 DIAGNOSIS — W19XXXD Unspecified fall, subsequent encounter: Secondary | ICD-10-CM | POA: Diagnosis not present

## 2023-08-15 DIAGNOSIS — J690 Pneumonitis due to inhalation of food and vomit: Secondary | ICD-10-CM | POA: Diagnosis not present

## 2023-08-15 DIAGNOSIS — G20A1 Parkinson's disease without dyskinesia, without mention of fluctuations: Secondary | ICD-10-CM | POA: Diagnosis not present

## 2023-08-15 DIAGNOSIS — F05 Delirium due to known physiological condition: Secondary | ICD-10-CM | POA: Diagnosis not present

## 2023-08-15 DIAGNOSIS — E785 Hyperlipidemia, unspecified: Secondary | ICD-10-CM | POA: Diagnosis not present

## 2023-08-15 DIAGNOSIS — S065X0D Traumatic subdural hemorrhage without loss of consciousness, subsequent encounter: Secondary | ICD-10-CM | POA: Diagnosis not present

## 2023-08-15 DIAGNOSIS — G500 Trigeminal neuralgia: Secondary | ICD-10-CM | POA: Diagnosis not present

## 2023-08-15 DIAGNOSIS — N183 Chronic kidney disease, stage 3 unspecified: Secondary | ICD-10-CM | POA: Diagnosis not present

## 2023-08-15 DIAGNOSIS — D649 Anemia, unspecified: Secondary | ICD-10-CM | POA: Diagnosis not present

## 2023-08-15 DIAGNOSIS — I129 Hypertensive chronic kidney disease with stage 1 through stage 4 chronic kidney disease, or unspecified chronic kidney disease: Secondary | ICD-10-CM | POA: Diagnosis not present

## 2023-08-15 DIAGNOSIS — E1122 Type 2 diabetes mellitus with diabetic chronic kidney disease: Secondary | ICD-10-CM | POA: Diagnosis not present

## 2023-08-15 DIAGNOSIS — N179 Acute kidney failure, unspecified: Secondary | ICD-10-CM | POA: Diagnosis not present

## 2023-08-18 DIAGNOSIS — N400 Enlarged prostate without lower urinary tract symptoms: Secondary | ICD-10-CM | POA: Diagnosis not present

## 2023-08-18 DIAGNOSIS — N183 Chronic kidney disease, stage 3 unspecified: Secondary | ICD-10-CM | POA: Diagnosis not present

## 2023-08-18 DIAGNOSIS — D649 Anemia, unspecified: Secondary | ICD-10-CM | POA: Diagnosis not present

## 2023-08-18 DIAGNOSIS — N179 Acute kidney failure, unspecified: Secondary | ICD-10-CM | POA: Diagnosis not present

## 2023-08-18 DIAGNOSIS — E785 Hyperlipidemia, unspecified: Secondary | ICD-10-CM | POA: Diagnosis not present

## 2023-08-18 DIAGNOSIS — F05 Delirium due to known physiological condition: Secondary | ICD-10-CM | POA: Diagnosis not present

## 2023-08-18 DIAGNOSIS — W19XXXD Unspecified fall, subsequent encounter: Secondary | ICD-10-CM | POA: Diagnosis not present

## 2023-08-18 DIAGNOSIS — J690 Pneumonitis due to inhalation of food and vomit: Secondary | ICD-10-CM | POA: Diagnosis not present

## 2023-08-18 DIAGNOSIS — I129 Hypertensive chronic kidney disease with stage 1 through stage 4 chronic kidney disease, or unspecified chronic kidney disease: Secondary | ICD-10-CM | POA: Diagnosis not present

## 2023-08-18 DIAGNOSIS — E1122 Type 2 diabetes mellitus with diabetic chronic kidney disease: Secondary | ICD-10-CM | POA: Diagnosis not present

## 2023-08-18 DIAGNOSIS — G20A1 Parkinson's disease without dyskinesia, without mention of fluctuations: Secondary | ICD-10-CM | POA: Diagnosis not present

## 2023-08-18 DIAGNOSIS — S065X0D Traumatic subdural hemorrhage without loss of consciousness, subsequent encounter: Secondary | ICD-10-CM | POA: Diagnosis not present

## 2023-08-20 DIAGNOSIS — N400 Enlarged prostate without lower urinary tract symptoms: Secondary | ICD-10-CM | POA: Diagnosis not present

## 2023-08-20 DIAGNOSIS — D649 Anemia, unspecified: Secondary | ICD-10-CM | POA: Diagnosis not present

## 2023-08-20 DIAGNOSIS — N179 Acute kidney failure, unspecified: Secondary | ICD-10-CM | POA: Diagnosis not present

## 2023-08-20 DIAGNOSIS — G20A1 Parkinson's disease without dyskinesia, without mention of fluctuations: Secondary | ICD-10-CM | POA: Diagnosis not present

## 2023-08-20 DIAGNOSIS — W19XXXD Unspecified fall, subsequent encounter: Secondary | ICD-10-CM | POA: Diagnosis not present

## 2023-08-20 DIAGNOSIS — N183 Chronic kidney disease, stage 3 unspecified: Secondary | ICD-10-CM | POA: Diagnosis not present

## 2023-08-20 DIAGNOSIS — S065X0D Traumatic subdural hemorrhage without loss of consciousness, subsequent encounter: Secondary | ICD-10-CM | POA: Diagnosis not present

## 2023-08-20 DIAGNOSIS — I129 Hypertensive chronic kidney disease with stage 1 through stage 4 chronic kidney disease, or unspecified chronic kidney disease: Secondary | ICD-10-CM | POA: Diagnosis not present

## 2023-08-20 DIAGNOSIS — G4700 Insomnia, unspecified: Secondary | ICD-10-CM | POA: Diagnosis not present

## 2023-08-20 DIAGNOSIS — M6281 Muscle weakness (generalized): Secondary | ICD-10-CM | POA: Diagnosis not present

## 2023-08-20 DIAGNOSIS — E785 Hyperlipidemia, unspecified: Secondary | ICD-10-CM | POA: Diagnosis not present

## 2023-08-20 DIAGNOSIS — F05 Delirium due to known physiological condition: Secondary | ICD-10-CM | POA: Diagnosis not present

## 2023-08-20 DIAGNOSIS — J690 Pneumonitis due to inhalation of food and vomit: Secondary | ICD-10-CM | POA: Diagnosis not present

## 2023-08-20 DIAGNOSIS — E1122 Type 2 diabetes mellitus with diabetic chronic kidney disease: Secondary | ICD-10-CM | POA: Diagnosis not present

## 2023-08-22 DIAGNOSIS — F05 Delirium due to known physiological condition: Secondary | ICD-10-CM | POA: Diagnosis not present

## 2023-08-22 DIAGNOSIS — N183 Chronic kidney disease, stage 3 unspecified: Secondary | ICD-10-CM | POA: Diagnosis not present

## 2023-08-22 DIAGNOSIS — E785 Hyperlipidemia, unspecified: Secondary | ICD-10-CM | POA: Diagnosis not present

## 2023-08-22 DIAGNOSIS — S065X0D Traumatic subdural hemorrhage without loss of consciousness, subsequent encounter: Secondary | ICD-10-CM | POA: Diagnosis not present

## 2023-08-22 DIAGNOSIS — I129 Hypertensive chronic kidney disease with stage 1 through stage 4 chronic kidney disease, or unspecified chronic kidney disease: Secondary | ICD-10-CM | POA: Diagnosis not present

## 2023-08-22 DIAGNOSIS — E1122 Type 2 diabetes mellitus with diabetic chronic kidney disease: Secondary | ICD-10-CM | POA: Diagnosis not present

## 2023-08-22 DIAGNOSIS — W19XXXD Unspecified fall, subsequent encounter: Secondary | ICD-10-CM | POA: Diagnosis not present

## 2023-08-22 DIAGNOSIS — N400 Enlarged prostate without lower urinary tract symptoms: Secondary | ICD-10-CM | POA: Diagnosis not present

## 2023-08-22 DIAGNOSIS — J690 Pneumonitis due to inhalation of food and vomit: Secondary | ICD-10-CM | POA: Diagnosis not present

## 2023-08-22 DIAGNOSIS — M6281 Muscle weakness (generalized): Secondary | ICD-10-CM | POA: Diagnosis not present

## 2023-08-22 DIAGNOSIS — E875 Hyperkalemia: Secondary | ICD-10-CM | POA: Diagnosis not present

## 2023-08-22 DIAGNOSIS — G4700 Insomnia, unspecified: Secondary | ICD-10-CM | POA: Diagnosis not present

## 2023-08-25 DIAGNOSIS — E785 Hyperlipidemia, unspecified: Secondary | ICD-10-CM | POA: Diagnosis not present

## 2023-08-25 DIAGNOSIS — E875 Hyperkalemia: Secondary | ICD-10-CM | POA: Diagnosis not present

## 2023-08-25 DIAGNOSIS — G4700 Insomnia, unspecified: Secondary | ICD-10-CM | POA: Diagnosis not present

## 2023-08-25 DIAGNOSIS — F05 Delirium due to known physiological condition: Secondary | ICD-10-CM | POA: Diagnosis not present

## 2023-08-25 DIAGNOSIS — W19XXXD Unspecified fall, subsequent encounter: Secondary | ICD-10-CM | POA: Diagnosis not present

## 2023-08-25 DIAGNOSIS — N400 Enlarged prostate without lower urinary tract symptoms: Secondary | ICD-10-CM | POA: Diagnosis not present

## 2023-08-25 DIAGNOSIS — M6281 Muscle weakness (generalized): Secondary | ICD-10-CM | POA: Diagnosis not present

## 2023-08-25 DIAGNOSIS — N183 Chronic kidney disease, stage 3 unspecified: Secondary | ICD-10-CM | POA: Diagnosis not present

## 2023-08-25 DIAGNOSIS — J690 Pneumonitis due to inhalation of food and vomit: Secondary | ICD-10-CM | POA: Diagnosis not present

## 2023-08-25 DIAGNOSIS — S065X0D Traumatic subdural hemorrhage without loss of consciousness, subsequent encounter: Secondary | ICD-10-CM | POA: Diagnosis not present

## 2023-08-25 DIAGNOSIS — E1122 Type 2 diabetes mellitus with diabetic chronic kidney disease: Secondary | ICD-10-CM | POA: Diagnosis not present

## 2023-08-25 DIAGNOSIS — I129 Hypertensive chronic kidney disease with stage 1 through stage 4 chronic kidney disease, or unspecified chronic kidney disease: Secondary | ICD-10-CM | POA: Diagnosis not present

## 2023-08-27 ENCOUNTER — Ambulatory Visit
Admission: RE | Admit: 2023-08-27 | Discharge: 2023-08-27 | Disposition: A | Discharge status: Home / Self Care | Source: Ambulatory Visit | Attending: Neurosurgery | Admitting: Neurosurgery

## 2023-08-27 DIAGNOSIS — L89622 Pressure ulcer of left heel, stage 2: Secondary | ICD-10-CM | POA: Diagnosis not present

## 2023-08-27 DIAGNOSIS — S065XAA Traumatic subdural hemorrhage with loss of consciousness status unknown, initial encounter: Secondary | ICD-10-CM | POA: Insufficient documentation

## 2023-08-27 DIAGNOSIS — E440 Moderate protein-calorie malnutrition: Secondary | ICD-10-CM | POA: Diagnosis not present

## 2023-08-27 DIAGNOSIS — D631 Anemia in chronic kidney disease: Secondary | ICD-10-CM | POA: Diagnosis not present

## 2023-08-27 DIAGNOSIS — E1122 Type 2 diabetes mellitus with diabetic chronic kidney disease: Secondary | ICD-10-CM | POA: Diagnosis not present

## 2023-08-27 DIAGNOSIS — I6200 Nontraumatic subdural hemorrhage, unspecified: Secondary | ICD-10-CM | POA: Diagnosis not present

## 2023-08-31 ENCOUNTER — Emergency Department

## 2023-08-31 ENCOUNTER — Other Ambulatory Visit: Payer: Self-pay

## 2023-08-31 ENCOUNTER — Observation Stay
Admission: EM | Admit: 2023-08-31 | Discharge: 2023-09-01 | Disposition: A | Discharge status: Skilled Nursing Facility | Attending: Internal Medicine | Admitting: Internal Medicine

## 2023-08-31 DIAGNOSIS — R0989 Other specified symptoms and signs involving the circulatory and respiratory systems: Secondary | ICD-10-CM | POA: Diagnosis not present

## 2023-08-31 DIAGNOSIS — R918 Other nonspecific abnormal finding of lung field: Secondary | ICD-10-CM | POA: Diagnosis not present

## 2023-08-31 DIAGNOSIS — N183 Chronic kidney disease, stage 3 unspecified: Secondary | ICD-10-CM | POA: Insufficient documentation

## 2023-08-31 DIAGNOSIS — E785 Hyperlipidemia, unspecified: Secondary | ICD-10-CM | POA: Insufficient documentation

## 2023-08-31 DIAGNOSIS — E1122 Type 2 diabetes mellitus with diabetic chronic kidney disease: Secondary | ICD-10-CM | POA: Insufficient documentation

## 2023-08-31 DIAGNOSIS — I6523 Occlusion and stenosis of bilateral carotid arteries: Secondary | ICD-10-CM | POA: Diagnosis not present

## 2023-08-31 DIAGNOSIS — R531 Weakness: Secondary | ICD-10-CM | POA: Diagnosis not present

## 2023-08-31 DIAGNOSIS — J189 Pneumonia, unspecified organism: Secondary | ICD-10-CM | POA: Diagnosis not present

## 2023-08-31 DIAGNOSIS — R059 Cough, unspecified: Secondary | ICD-10-CM | POA: Diagnosis not present

## 2023-08-31 DIAGNOSIS — G20A1 Parkinson's disease without dyskinesia, without mention of fluctuations: Secondary | ICD-10-CM | POA: Insufficient documentation

## 2023-08-31 DIAGNOSIS — N400 Enlarged prostate without lower urinary tract symptoms: Secondary | ICD-10-CM | POA: Diagnosis not present

## 2023-08-31 DIAGNOSIS — E861 Hypovolemia: Secondary | ICD-10-CM

## 2023-08-31 DIAGNOSIS — I959 Hypotension, unspecified: Secondary | ICD-10-CM

## 2023-08-31 DIAGNOSIS — R4182 Altered mental status, unspecified: Secondary | ICD-10-CM | POA: Diagnosis not present

## 2023-08-31 DIAGNOSIS — I129 Hypertensive chronic kidney disease with stage 1 through stage 4 chronic kidney disease, or unspecified chronic kidney disease: Secondary | ICD-10-CM | POA: Insufficient documentation

## 2023-08-31 DIAGNOSIS — G20C Parkinsonism, unspecified: Secondary | ICD-10-CM | POA: Diagnosis not present

## 2023-08-31 DIAGNOSIS — J90 Pleural effusion, not elsewhere classified: Secondary | ICD-10-CM | POA: Diagnosis not present

## 2023-08-31 HISTORY — DX: Old myocardial infarction: I25.2

## 2023-08-31 HISTORY — DX: Malignant (primary) neoplasm, unspecified: C80.1

## 2023-08-31 HISTORY — DX: Unspecified osteoarthritis, unspecified site: M19.90

## 2023-08-31 LAB — URINALYSIS, ROUTINE W REFLEX MICROSCOPIC
Bilirubin Urine: NEGATIVE
Glucose, UA: NEGATIVE mg/dL
Hgb urine dipstick: NEGATIVE
Ketones, ur: NEGATIVE mg/dL
Leukocytes,Ua: NEGATIVE
Nitrite: NEGATIVE
Protein, ur: NEGATIVE mg/dL
Specific Gravity, Urine: 1.012 (ref 1.005–1.030)
pH: 5 (ref 5.0–8.0)

## 2023-08-31 LAB — COMPREHENSIVE METABOLIC PANEL WITH GFR
ALT: 6 U/L (ref 0–44)
AST: 20 U/L (ref 15–41)
Albumin: 2.6 g/dL — ABNORMAL LOW (ref 3.5–5.0)
Alkaline Phosphatase: 103 U/L (ref 38–126)
Anion gap: 7 (ref 5–15)
BUN: 31 mg/dL — ABNORMAL HIGH (ref 8–23)
CO2: 24 mmol/L (ref 22–32)
Calcium: 8.3 mg/dL — ABNORMAL LOW (ref 8.9–10.3)
Chloride: 105 mmol/L (ref 98–111)
Creatinine, Ser: 1.35 mg/dL — ABNORMAL HIGH (ref 0.61–1.24)
GFR, Estimated: 52 mL/min — ABNORMAL LOW (ref >60)
Glucose, Bld: 155 mg/dL — ABNORMAL HIGH (ref 70–99)
Potassium: 4.9 mmol/L (ref 3.5–5.1)
Sodium: 136 mmol/L (ref 135–145)
Total Bilirubin: 0.3 mg/dL (ref 0.0–1.2)
Total Protein: 6.1 g/dL — ABNORMAL LOW (ref 6.5–8.1)

## 2023-08-31 LAB — CBC
HCT: 30.4 % — ABNORMAL LOW (ref 39.0–52.0)
Hemoglobin: 9.2 g/dL — ABNORMAL LOW (ref 13.0–17.0)
MCH: 30.7 pg (ref 26.0–34.0)
MCHC: 30.3 g/dL (ref 30.0–36.0)
MCV: 101.3 fL — ABNORMAL HIGH (ref 80.0–100.0)
Platelets: 180 K/uL (ref 150–400)
RBC: 3 MIL/uL — ABNORMAL LOW (ref 4.22–5.81)
RDW: 14.8 % (ref 11.5–15.5)
WBC: 5.7 K/uL (ref 4.0–10.5)
nRBC: 0 % (ref 0.0–0.2)

## 2023-08-31 LAB — TROPONIN I (HIGH SENSITIVITY): Troponin I (High Sensitivity): 5 ng/L (ref <18)

## 2023-08-31 LAB — LACTIC ACID, PLASMA: Lactic Acid, Venous: 1.1 mmol/L (ref 0.5–1.9)

## 2023-08-31 MED ORDER — TAMSULOSIN HCL 0.4 MG PO CAPS
0.4000 mg | ORAL_CAPSULE | Freq: Every day | ORAL | Status: DC
  Administered 2023-09-01: 0.4 mg via ORAL
  Filled 2023-08-31: qty 1

## 2023-08-31 MED ORDER — MODAFINIL 100 MG PO TABS
100.0000 mg | ORAL_TABLET | Freq: Every day | ORAL | Status: DC
  Administered 2023-09-01: 100 mg via ORAL
  Filled 2023-08-31 (×2): qty 1

## 2023-08-31 MED ORDER — ONDANSETRON HCL 4 MG PO TABS
4.0000 mg | ORAL_TABLET | Freq: Four times a day (QID) | ORAL | Status: DC | PRN
Start: 2023-08-31 — End: 2023-09-01

## 2023-08-31 MED ORDER — POLYETHYLENE GLYCOL 3350 17 G PO PACK: 17.0000 g | PACK | Freq: Every day | ORAL | Status: DC | PRN

## 2023-08-31 MED ORDER — SODIUM CHLORIDE 0.9 % IV SOLN
500.0000 mg | INTRAVENOUS | Status: DC
  Administered 2023-08-31: 500 mg via INTRAVENOUS
  Filled 2023-08-31: qty 5

## 2023-08-31 MED ORDER — TRAMADOL HCL 50 MG PO TABS
50.0000 mg | ORAL_TABLET | Freq: Three times a day (TID) | ORAL | Status: DC | PRN
  Administered 2023-09-01: 50 mg via ORAL
  Filled 2023-08-31: qty 1

## 2023-08-31 MED ORDER — SODIUM CHLORIDE 0.9 % IV SOLN: 500.0000 mg | Freq: Once | INTRAVENOUS | Status: DC

## 2023-08-31 MED ORDER — CLONAZEPAM 0.5 MG PO TABS: 0.5000 mg | ORAL_TABLET | Freq: Every day | ORAL | Status: DC | PRN

## 2023-08-31 MED ORDER — TRAMADOL HCL 50 MG PO TABS
50.0000 mg | ORAL_TABLET | Freq: Once | ORAL | Status: AC
  Administered 2023-08-31: 50 mg via ORAL
  Filled 2023-08-31: qty 1

## 2023-08-31 MED ORDER — BACITRACIN ZINC 500 UNIT/GM EX OINT
TOPICAL_OINTMENT | Freq: Every day | CUTANEOUS | Status: DC
  Administered 2023-08-31 – 2023-09-01 (×2): 1 via TOPICAL
  Filled 2023-08-31 (×2): qty 0.9

## 2023-08-31 MED ORDER — GABAPENTIN 300 MG PO CAPS
300.0000 mg | ORAL_CAPSULE | Freq: Every day | ORAL | Status: DC
  Administered 2023-08-31: 300 mg via ORAL
  Filled 2023-08-31: qty 1

## 2023-08-31 MED ORDER — CARBIDOPA-LEVODOPA 25-100 MG PO TABS
1.0000 | ORAL_TABLET | Freq: Three times a day (TID) | ORAL | Status: DC
  Administered 2023-08-31 – 2023-09-01 (×2): 1 via ORAL
  Filled 2023-08-31 (×3): qty 1

## 2023-08-31 MED ORDER — SODIUM CHLORIDE 0.9 % IV SOLN: INTRAVENOUS | Status: DC

## 2023-08-31 MED ORDER — MONTELUKAST SODIUM 10 MG PO TABS
10.0000 mg | ORAL_TABLET | Freq: Every day | ORAL | Status: DC
  Administered 2023-08-31: 10 mg via ORAL
  Filled 2023-08-31: qty 1

## 2023-08-31 MED ORDER — MELATONIN 5 MG PO TABS
10.0000 mg | ORAL_TABLET | Freq: Every day | ORAL | Status: DC
  Administered 2023-08-31: 10 mg via ORAL
  Filled 2023-08-31: qty 2

## 2023-08-31 MED ORDER — POLYVINYL ALCOHOL 1.4 % OP SOLN: 2.0000 [drp] | Freq: Four times a day (QID) | OPHTHALMIC | Status: DC | PRN

## 2023-08-31 MED ORDER — TRAZODONE HCL 50 MG PO TABS: 25.0000 mg | ORAL_TABLET | Freq: Every evening | ORAL | Status: DC | PRN

## 2023-08-31 MED ORDER — DIPHENHYDRAMINE HCL 25 MG PO CAPS
50.0000 mg | ORAL_CAPSULE | Freq: Every day | ORAL | Status: DC
  Administered 2023-08-31: 50 mg via ORAL
  Filled 2023-08-31: qty 2

## 2023-08-31 MED ORDER — VITAMIN D (ERGOCALCIFEROL) 1.25 MG (50000 UNIT) PO CAPS: 50000.0000 [IU] | ORAL_CAPSULE | ORAL | Status: DC

## 2023-08-31 MED ORDER — ENOXAPARIN SODIUM 40 MG/0.4ML IJ SOSY
40.0000 mg | PREFILLED_SYRINGE | INTRAMUSCULAR | Status: DC
  Administered 2023-08-31: 40 mg via SUBCUTANEOUS
  Filled 2023-08-31: qty 0.4

## 2023-08-31 MED ORDER — METOPROLOL TARTRATE 25 MG PO TABS
12.5000 mg | ORAL_TABLET | Freq: Two times a day (BID) | ORAL | Status: DC
  Administered 2023-08-31 – 2023-09-01 (×2): 12.5 mg via ORAL
  Filled 2023-08-31 (×2): qty 1

## 2023-08-31 MED ORDER — CARBAMAZEPINE 200 MG PO TABS
200.0000 mg | ORAL_TABLET | Freq: Three times a day (TID) | ORAL | Status: DC
  Administered 2023-08-31 – 2023-09-01 (×2): 200 mg via ORAL
  Filled 2023-08-31 (×2): qty 1

## 2023-08-31 MED ORDER — HYPROMELLOSE (GONIOSCOPIC) 2.5 % OP SOLN: 2.0000 [drp] | Freq: Four times a day (QID) | OPHTHALMIC | Status: DC | PRN

## 2023-08-31 MED ORDER — ACETAMINOPHEN 325 MG PO TABS
650.0000 mg | ORAL_TABLET | Freq: Four times a day (QID) | ORAL | Status: DC | PRN
  Filled 2023-08-31: qty 2

## 2023-08-31 MED ORDER — GUAIFENESIN ER 600 MG PO TB12
600.0000 mg | ORAL_TABLET | Freq: Two times a day (BID) | ORAL | Status: DC
  Administered 2023-08-31 – 2023-09-01 (×2): 600 mg via ORAL
  Filled 2023-08-31 (×2): qty 1

## 2023-08-31 MED ORDER — ATORVASTATIN CALCIUM 20 MG PO TABS
10.0000 mg | ORAL_TABLET | Freq: Every day | ORAL | Status: DC
  Administered 2023-08-31: 10 mg via ORAL
  Filled 2023-08-31: qty 1

## 2023-08-31 MED ORDER — FLUTICASONE PROPIONATE 50 MCG/ACT NA SUSP: 2.0000 | Freq: Every day | NASAL | Status: DC | PRN

## 2023-08-31 MED ORDER — HYDROCORTISONE 1 % EX CREA: 1.0000 | TOPICAL_CREAM | Freq: Three times a day (TID) | CUTANEOUS | Status: DC | PRN

## 2023-08-31 MED ORDER — FOLIC ACID 1 MG PO TABS
1.0000 mg | ORAL_TABLET | Freq: Every day | ORAL | Status: DC
  Administered 2023-08-31 – 2023-09-01 (×2): 1 mg via ORAL
  Filled 2023-08-31 (×2): qty 1

## 2023-08-31 MED ORDER — PANTOPRAZOLE SODIUM 40 MG PO TBEC
40.0000 mg | DELAYED_RELEASE_TABLET | Freq: Every day | ORAL | Status: DC
  Administered 2023-09-01: 40 mg via ORAL
  Filled 2023-08-31: qty 1

## 2023-08-31 MED ORDER — LACTATED RINGERS IV BOLUS
1000.0000 mL | Freq: Once | INTRAVENOUS | Status: AC
  Administered 2023-08-31: 1000 mL via INTRAVENOUS

## 2023-08-31 MED ORDER — FERROUS SULFATE 325 (65 FE) MG PO TABS
325.0000 mg | ORAL_TABLET | Freq: Every day | ORAL | Status: DC
  Administered 2023-09-01: 325 mg via ORAL
  Filled 2023-08-31: qty 1

## 2023-08-31 MED ORDER — SODIUM CHLORIDE 0.9 % IV SOLN: 1.0000 g | Freq: Once | INTRAVENOUS | Status: DC

## 2023-08-31 MED ORDER — ONDANSETRON HCL 4 MG/2ML IJ SOLN
4.0000 mg | Freq: Four times a day (QID) | INTRAMUSCULAR | Status: DC | PRN
Start: 2023-08-31 — End: 2023-09-01

## 2023-08-31 MED ORDER — HYDROCOD POLI-CHLORPHE POLI ER 10-8 MG/5ML PO SUER: 5.0000 mL | Freq: Two times a day (BID) | ORAL | Status: DC | PRN

## 2023-08-31 MED ORDER — MAGNESIUM HYDROXIDE 400 MG/5ML PO SUSP: 30.0000 mL | Freq: Every day | ORAL | Status: DC | PRN

## 2023-08-31 MED ORDER — ACETAMINOPHEN 650 MG RE SUPP: 650.0000 mg | Freq: Four times a day (QID) | RECTAL | Status: DC | PRN

## 2023-08-31 MED ORDER — SODIUM CHLORIDE 0.9 % IV SOLN
2.0000 g | INTRAVENOUS | Status: DC
  Administered 2023-08-31: 2 g via INTRAVENOUS
  Filled 2023-08-31: qty 20

## 2023-08-31 MED ORDER — SENNOSIDES 8.8 MG/5ML PO SYRP: 5.0000 mL | ORAL_SOLUTION | Freq: Every day | ORAL | Status: DC

## 2023-08-31 MED ORDER — IPRATROPIUM-ALBUTEROL 0.5-2.5 (3) MG/3ML IN SOLN
3.0000 mL | Freq: Four times a day (QID) | RESPIRATORY_TRACT | Status: DC
  Administered 2023-08-31 – 2023-09-01 (×2): 3 mL via RESPIRATORY_TRACT
  Filled 2023-08-31 (×2): qty 3

## 2023-08-31 NOTE — Assessment & Plan Note (Addendum)
-   This could be related to volume depletion and mild dehydration. - It could be early manifestation of sepsis. - Will continue hydration with IV normal saline and monitor BP with every 12 hours orthostatics.

## 2023-08-31 NOTE — Assessment & Plan Note (Signed)
-   This is likely related to #1 and #2. - Management as above. - PT consult to be obtained.

## 2023-08-31 NOTE — H&P (Signed)
 [place]   PATIENT NAME: Jack White    MR#:  [id number]  DATE OF BIRTH:  07/08/1940  DATE OF ADMISSION:  08/31/2023  PRIMARY CARE PHYSICIAN: Diedra Lame, MD   Patient is coming from: Home  REQUESTING/REFERRING PHYSICIAN: Jacolyn Guild, MD  CHIEF COMPLAINT:   Chief Complaint  Patient presents with   Hypotension    HISTORY OF PRESENT ILLNESS:  Jack White is a 83 y.o. Caucasian male with medical history significant for stage III chronic kidney disease, type 2 diabetes mellitus, essential hypertension, peptic ulcer disease and recent admission for subdural hematoma at [facility], s/p craniotomy, who presented to the emergency room with acute onset of generalized weakness and hypotension.  The patient had a large bowel movement and then became hypotensive with a BP of 90s over 40s per EMS.  He admitted to recent cough productive of greenish-yellow sputum with associated wheezing that have been going on over a week now.  No dyspnea or orthopnea or paroxysmal nocturnal dyspnea or hemoptysis.  His facility also reported that he was confused but when he came to the ER he stated that he was relatively well.  He admitted to mild headache without dizziness or blurred vision.  No paresthesias or focal muscle weakness.  No chest pain or palpitations.  ED Course: When the patient came to the ER, BP was 103/44 with otherwise normal vitals.  Labs revealed a blood glucose of 155 and a BUN of 31 with creatinine 1.35.  Previous levels with calcium  of 8.3 and albumin  2.6 and total protein 6.1.  High-sensitivity troponin was 5 and lactic acid was 1.1.  CBC showed anemia with hemoglobin 9.2 hematocrit 30.4 better than previous levels with microcytosis.  EKG as reviewed by me : EKG showed sinus rhythm rate 63 with left axis deviation and Q waves anteroseptal leads. Imaging: 2 view chest x-ray showed increasing bibasilar opacities more on the left that may represent atelectasis or pneumonia  with trace bilateral pleural effusion and stable cardiomegaly.  The patient was given IV Rocephin  and Zithromax .  50 mg of p.o. tramadol  and 1 L bolus of IV lactated Ringer .  He will be admitted to a medical telemetry observation bed for further evaluation and management. 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 Lucien   COLOSTOMY TAKEDOWN N/A 10/26/2014   Procedure: COLOSTOMY TAKEDOWN;  Surgeon: Elgin Laurence III, MD;  Location: [facility] ORS;  Service: General;  Laterality: N/A;   CRANIOTOMY Right 07/21/2023   Procedure: CRANIOTOMY HEMATOMA EVACUATION SUBDURAL;  Surgeon: Lanis Pupa, MD;  Location: [facility] OR;  Service: Neurosurgery;  Laterality: Right;   EXPLORATORY LAPAROTOMY  05/28/2014   Dr. Lucien   LAPAROSCOPIC CHOLECYSTECTOMY  05/21/2014   Dr. Laurence    SOCIAL HISTORY:   Social History   Tobacco Use   Smoking status: Never  Smokeless tobacco: Never  Substance Use Topics   Alcohol  use: No    Alcohol /week: 0.0 standard drinks of alcohol     FAMILY HISTORY:   Family History  Problem Relation Age of Onset   Diabetes Mother    Hypertension Mother    Alzheimer's disease Mother    Heart disease Father    Diabetes Sister     DRUG ALLERGIES:   Allergies  Allergen Reactions   Dilaudid [Hydromorphone Hcl] Other (See Comments)    Respiratory arrest    REVIEW OF SYSTEMS:   ROS As per history  of present illness. All pertinent systems were reviewed above. Constitutional, HEENT, cardiovascular, respiratory, GI, GU, musculoskeletal, neuro, psychiatric, endocrine, integumentary and hematologic systems were reviewed and are otherwise negative/unremarkable except for positive findings mentioned above in the HPI.   MEDICATIONS AT HOME:   Prior to Admission medications   Medication Sig Start Date End Date Taking? Authorizing Provider  acetaminophen  (TYLENOL ) 500 MG tablet Take 500-1,000 mg by mouth every 6 (six) hours as needed for moderate pain (pain score 4-6).    [provider]  atorvastatin  (LIPITOR) 10 MG tablet Take 1 tablet (10 mg total) by mouth at bedtime. 08/13/23   Patsy Lenis, MD  bacitracin  ointment Apply topically daily. Cleanse L heel wound with NS, apply Bacitracin  ointment to wound bed daily, cover with Telfa nonstick dressing and secure with silicone foam or Kerlix roll gauze whichever is preferred 08/14/23   Patsy Lenis, MD  carbamazepine  (TEGRETOL ) 200 MG tablet Take 1 tablet (200 mg total) by mouth 3 (three) times daily. 08/13/23   Patsy Lenis, MD  carbidopa -levodopa  (SINEMET  IR) 25-100 MG tablet Take 1 tablet by mouth 3 (three) times daily. 11/13/22 11/13/23  [provider]  clonazePAM  (KLONOPIN ) 0.5 MG tablet Take 0.5 tablets (0.25 mg total) by mouth daily as needed for anxiety. 08/13/23   Patsy Lenis, MD  diphenhydrAMINE  (BENADRYL ) 25 MG tablet Take 2 tablets (50 mg total) by mouth at bedtime. 08/13/23   Patsy Lenis, MD  doxazosin  (CARDURA ) 4 MG tablet Take 4 mg by mouth daily. Patient not taking: Reported on 07/22/2023 10/17/14   [provider]  ergocalciferol  (VITAMIN D2) 1.25 MG (50000 UT) capsule Take 50,000 Units by mouth once a week.    [provider]  ferrous sulfate  325 (65 FE) MG tablet Take 1 tablet (325 mg total) by mouth daily with breakfast. 08/14/23   Patsy Lenis, MD  fluticasone  (FLONASE ) 50 MCG/ACT nasal  spray Place 2 sprays into both nostrils daily as needed for allergies. 12/10/22 12/10/23  [provider]  folic acid  (FOLVITE ) 1 MG tablet Take 1 tablet (1 mg total) by mouth daily. 08/14/23   Patsy Lenis, MD  gabapentin  (NEURONTIN ) 300 MG capsule Take 300 mg by mouth at bedtime. 08/17/14   [provider]  hydrocortisone  cream 1 % Apply 1 Application topically 3 (three) times daily as needed for itching. 08/13/23   Patsy Lenis, MD  hydroxypropyl methylcellulose / hypromellose (ISOPTO TEARS / GONIOVISC) 2.5 % ophthalmic solution Place 2 drops into both eyes 4 (four) times daily as needed for dry eyes. 08/13/23   Patsy Lenis, MD  melatonin 10 MG TABS Take 10 mg by mouth at bedtime. 08/13/23   Patsy Lenis, MD  metFORMIN  (GLUCOPHAGE -XR) 500 MG 24 hr tablet Take 500 mg by mouth daily. 04/14/23 04/13/24  [provider]  metoprolol  tartrate (LOPRESSOR ) 25 MG tablet Take 0.5 tablets (12.5 mg total) by mouth 2 (two) times daily. 08/13/23  Patsy Lenis, MD  modafinil  (PROVIGIL ) 100 MG tablet Take 1 tablet (100 mg total) by mouth daily. 08/14/23   Patsy Lenis, MD  montelukast  (SINGULAIR ) 10 MG tablet Take 10 mg by mouth at bedtime.    [provider]  omeprazole (PRILOSEC) 20 MG capsule Take 20 mg by mouth daily. 08/17/14   [provider]  polyethylene glycol (MIRALAX  / GLYCOLAX ) 17 g packet Take 17 g by mouth daily as needed. 08/13/23   Patsy Lenis, MD  sennosides (SENOKOT) 8.8 MG/5ML syrup Take 5 mLs by mouth at bedtime. 08/13/23   Patsy Lenis, MD  tamsulosin  (FLOMAX ) 0.4 MG CAPS capsule Take 0.4 mg by mouth daily after breakfast. 03/05/23 03/04/24  [provider]  traMADol  (ULTRAM ) 50 MG tablet Take 1 tablet (50 mg total) by mouth every 8 (eight) hours as needed for severe pain (pain score 7-10). 08/13/23   Patsy Lenis, MD      VITAL SIGNS:  Blood pressure (!) 127/53, pulse 81, temperature 97.7 F (36.5 C), temperature source Oral,  resp. rate 20, height 6' (1.829 m), weight 99.8 kg, SpO2 92%.  PHYSICAL EXAMINATION:  Physical Exam  GENERAL:  83 y.o.-year-old patient lying in the bed with no acute distress.  EYES: Pupils equal, round, reactive to light and accommodation. No scleral icterus. Extraocular muscles intact.  HEENT: Head atraumatic, normocephalic. Oropharynx and nasopharynx clear.  NECK:  Supple, no jugular venous distention. No thyroid  enlargement, no tenderness.  LUNGS: Slight diminished bibasilar breath sounds with bibasal crackles more on the left.. No use of accessory muscles of respiration.  CARDIOVASCULAR: Regular rate and rhythm, S1, S2 normal. No murmurs, rubs, or gallops.  ABDOMEN: Soft, nondistended, nontender. Bowel sounds present. No organomegaly or mass.  EXTREMITIES: No pedal edema, cyanosis, or clubbing.  NEUROLOGIC: Cranial nerves II through XII are intact. Muscle strength 5/5 in all extremities. Sensation intact. Gait not checked.  PSYCHIATRIC: The patient is alert and oriented x 3.  Normal affect and good eye contact. SKIN: No obvious rash, lesion, or ulcer.   LABORATORY PANEL:   CBC Recent Labs  Lab 08/31/23 1425  WBC 5.7  HGB 9.2*  HCT 30.4*  PLT 180   ------------------------------------------------------------------------------------------------------------------  Chemistries  Recent Labs  Lab 08/31/23 1425  NA 136  K 4.9  CL 105  CO2 24  GLUCOSE 155*  BUN 31*  CREATININE 1.35*  CALCIUM  8.3*  AST 20  ALT 6  ALKPHOS 103  BILITOT 0.3   ------------------------------------------------------------------------------------------------------------------  Cardiac Enzymes No results for input(s): TROPONINI in the last 168 hours. ------------------------------------------------------------------------------------------------------------------  RADIOLOGY:  CT Head Wo Contrast Result Date: 08/31/2023 CLINICAL DATA:  Mental status change, unknown cause EXAM: CT HEAD  WITHOUT CONTRAST TECHNIQUE: Contiguous axial images were obtained from the base of the skull through the vertex without intravenous contrast. RADIATION DOSE REDUCTION: This exam was performed according to the departmental dose-optimization program which includes automated exposure control, adjustment of the mA and/or kV according to patient size and/or use of iterative reconstruction technique. COMPARISON:  CT head 08/27/2023, CT head 08/10/2023 FINDINGS: Brain: Patchy and confluent areas of decreased attenuation are noted throughout the deep and periventricular white matter of the cerebral hemispheres bilaterally, compatible with chronic microvascular ischemic disease. No evidence of large-territorial acute infarction. No parenchymal hemorrhage. No mass lesion. No acute extra-axial collection. Subacute stable 9 mm subdural fluid collection (4:33). Stable chronic partially calcified dural thickening/subacute to chronic hematoma anteriorly (4:20). Stable right to left 3 mm midline shift. No hydrocephalus. Basilar cisterns are patent.  Vascular: No hyperdense vessel. Atherosclerotic calcifications are present within the cavernous internal carotid arteries. Skull: No acute fracture or focal lesion. Prior right frontal craniotomy. Sinuses/Orbits: Bilateral ethmoid mucosal thickening. Otherwise paranasal sinuses and mastoid air cells are clear. The orbits are unremarkable. Other: None. IMPRESSION: No acute intracranial abnormality. Persistent subacute to chronic subdural findings as above with stable 3 mm right to left midline shift. Electronically Signed   By: Morgane  Naveau M.D.   On: 08/31/2023 18:39   DG Chest 2 View Result Date: 08/31/2023 CLINICAL DATA:  Cough, weakness. EXAM: CHEST - 2 VIEW COMPARISON:  Chest radiograph 08/11/2023 FINDINGS: Lung volumes are low. Cardiomegaly is stable. Unchanged mediastinal contours. Trace bilateral pleural effusions. Increasing bibasilar opacities, more so on the left. No  pneumothorax. IMPRESSION: 1. Increasing bibasilar opacities, more so on the left, may represent atelectasis or pneumonia. 2. Trace bilateral pleural effusions. 3. Stable cardiomegaly. Electronically Signed   By: Andrea Gasman M.D.   On: 08/31/2023 18:23      IMPRESSION AND PLAN:  Assessment and Plan: * CAP (community acquired pneumonia) - The patient will be admitted to a medical telemetry bed. - Will continue antibiotic therapy with IV Rocephin  and Zithromax . - Mucolytic therapy be provided as well as duo nebs q.i.d. and q.4 hours p.r.n. - We will follow blood cultures.   Hypotension - This could be related to volume depletion and mild dehydration. - It could be early manifestation of sepsis. - Will continue hydration with IV normal saline and monitor BP with every 12 hours orthostatics.  Generalized weakness - This is likely related to #1 and #2. - Management as above. - PT consult to be obtained.  Type 2 diabetes mellitus with chronic kidney disease, without long-term current use of insulin  (HCC) - The patient will be placed on supplemental coverage with NovoLog . - Will hold off metformin . - Will resume Neurontin  for diabetic neuropathy.  Dyslipidemia - Will continue statin therapy.  Parkinson's disease (HCC) - Will continue Sinemet  IR.  BPH (benign prostatic hyperplasia) - Will continue Flomax .   DVT prophylaxis: Lovenox .  Advanced Care Planning:  Code Status: The patient is DNR only. Family Communication:  The plan of care was discussed in details with the patient (and family). I answered all questions. The patient agreed to proceed with the above mentioned plan. Further management will depend upon hospital course. Disposition Plan: Back to previous home environment Consults called: none.  All the records are reviewed and case discussed with ED provider.  Status is: Observation  I certify that at the time of admission, it is my clinical judgment that the  patient will require  hospital care extending less than 2 midnights.                            Dispo: The patient is from: Home              Anticipated d/c is to: Home              Patient currently is not medically stable to d/c.              Difficult to place patient: No  [place] DELENA Peaches M.D on 08/31/2023 at 8:18 PM  [facility]   From 7 PM-7 AM, contact night-coverage [email or web address]  CC: Primary care physician; Diedra Lame, MD

## 2023-08-31 NOTE — ED Triage Notes (Signed)
 To ED from [facility] rehab via [facility] for hypotension after large BM. BM was possibly melanotic but EMS unsure. Recent subdural hmmg.  Seen here on 7/2 for UTI. Confused today, not his baseline per staff, pt is usually more alert and talkative.   EMS VS: CBG 129, 97.2, etco2 38, 98/44  BP is 103/56. Pt is alert and oriented. Endorses generalized weakness.

## 2023-08-31 NOTE — Assessment & Plan Note (Signed)
-   Will continue Sinemet  IR.

## 2023-08-31 NOTE — Assessment & Plan Note (Signed)
-   The patient will be admitted to a medical telemetry bed. - Will continue antibiotic therapy with IV Rocephin and Zithromax. - Mucolytic therapy be provided as well as duo nebs q.i.d. and q.4 hours p.r.n. - We will follow blood cultures.

## 2023-08-31 NOTE — ED Notes (Signed)
 Called CT at this time to inform them pt is okay to go to CT.

## 2023-08-31 NOTE — Assessment & Plan Note (Signed)
Will continue Flomax.

## 2023-08-31 NOTE — ED Provider Notes (Signed)
 [facility] Provider Note    Event Date/Time   First MD Initiated Contact with Patient 08/31/23 1547     (approximate)   History   Hypotension   HPI  Jack White is a 83 y.o. male with history of hypertension, hyperlipidemia, diabetes, CKD, possible Parkinson's, trigeminal neuralgia, BPH and recent subdural hemorrhage, who presents with weakness and hypotension.  Per EMS, the patient had a large bowel movement and then became hypotensive with a blood pressure as low as 90s/40s in the field.  There was a history of possible melena.  The facility also reported that the patient was confused.  Currently the patient states that he feels relatively well.  He has a mild headache.  He denies feeling dizzy or lightheaded.  He has no other acute pain.  He denies any difficulty breathing.   I reviewed the past medical records.  The patient was admitted to the neurosurgery last month after presenting with headache and left-sided weakness after mechanical fall; he was found to have a large right subdural hematoma and had a craniotomy performed.  He was discharged on 6/19.   Physical Exam   Triage Vital Signs: ED Triage Vitals  Encounter Vitals Group     BP 08/31/23 1421 (!) 103/56     Girls Systolic BP Percentile --      Girls Diastolic BP Percentile --      Boys Systolic BP Percentile --      Boys Diastolic BP Percentile --      Pulse Rate 08/31/23 1421 61     Resp 08/31/23 1421 20     Temp 08/31/23 1421 97.8 F (36.6 C)     Temp Source 08/31/23 1421 Oral     SpO2 08/31/23 1421 96 %     Weight 08/31/23 1421 220 lb (99.8 kg)     Height 08/31/23 1421 6' (1.829 m)     Head Circumference --      Peak Flow --      Pain Score 08/31/23 1422 0     Pain Loc --      Pain Education --      Exclude from Growth Chart --     Most recent vital signs: Vitals:   08/31/23 1900 08/31/23 1915  BP: (!) 127/53   Pulse: 84 81  Resp: 18 20  Temp:    SpO2: 94% 92%      General: Alert, oriented x 4, no distress.  CV:  Good peripheral perfusion.  Resp:  Normal effort.  Lungs CTAB. Abd:  Soft with no focal tenderness.  No distention.  Other:  EOMI.  PERRLA.  No facial droop.  Normal speech.  Motor intact in all extremities.  Dry mucous membranes.  No peripheral edema.   ED Results / Procedures / Treatments   Labs (all labs ordered are listed, but only abnormal results are displayed) Labs Reviewed  COMPREHENSIVE METABOLIC PANEL WITH GFR - Abnormal; Notable for the following components:      Result Value   Glucose, Bld 155 (*)    BUN 31 (*)    Creatinine, Ser 1.35 (*)    Calcium  8.3 (*)    Total Protein 6.1 (*)    Albumin  2.6 (*)    GFR, Estimated 52 (*)    All other components within normal limits  CBC - Abnormal; Notable for the following components:   RBC 3.00 (*)    Hemoglobin 9.2 (*)    HCT 30.4 (*)  MCV 101.3 (*)    All other components within normal limits  URINALYSIS, ROUTINE W REFLEX MICROSCOPIC - Abnormal; Notable for the following components:   Color, Urine YELLOW (*)    APPearance CLEAR (*)    All other components within normal limits  LACTIC ACID, PLASMA  TROPONIN I (HIGH SENSITIVITY)     EKG  ED ECG REPORT I, Waylon Cassis, the attending physician, personally viewed and interpreted this ECG.  Date: 08/31/2023 EKG Time: 1424 Rate: 63 Rhythm: Difficult to interpret, appears likely sinus QRS Axis: Left axis  Intervals: normal ST/T Wave abnormalities: Nonspecific ST abnormalities Narrative Interpretation: no evidence of acute ischemia    RADIOLOGY  CT head: I independently viewed and interpreted the images; there is no acute hemorrhage.  Subdural is stable from prior.  Radiology report indicates the following:  IMPRESSION:  No acute intracranial abnormality. Persistent subacute to chronic  subdural findings as above with stable 3 mm right to left midline  shift.   Chest x-ray:  IMPRESSION:  1.  Increasing bibasilar opacities, more so on the left, may  represent atelectasis or pneumonia.  2. Trace bilateral pleural effusions.  3. Stable cardiomegaly.     PROCEDURES:  Critical Care performed: No  Procedures   MEDICATIONS ORDERED IN ED: Medications  azithromycin  (ZITHROMAX ) 500 mg in sodium chloride  0.9 % 250 mL IVPB (has no administration in time range)  cefTRIAXone  (ROCEPHIN ) 1 g in sodium chloride  0.9 % 100 mL IVPB (has no administration in time range)  lactated ringers  bolus 1,000 mL (1,000 mLs Intravenous New Bag/Given 08/31/23 1658)  traMADol  (ULTRAM ) tablet 50 mg (50 mg Oral Given 08/31/23 1917)     IMPRESSION / MDM / ASSESSMENT AND PLAN / ED COURSE  I reviewed the triage vital signs and the nursing notes.  83 year old male with PMH as noted above presents with an episode of hypotension, weakness, possible altered mental status.  On exam the patient's blood pressure is borderline low.  Other vital signs are normal.  He is actually alert and oriented at this time with a nonfocal neurologic exam.  Physical exam is otherwise unremarkable for acute findings.  Differential diagnosis includes, but is not limited to, dehydration/hypovolemia, sepsis, electrolyte abnormality, other metabolic disturbance, less likely cardiac etiology, recurrent hemorrhage or other CNS complication.  We will obtain lab workup, CT head, give fluids, and reassess.  Patient's presentation is most consistent with acute presentation with potential threat to life or bodily function.  The patient is on the cardiac monitor to evaluate for evidence of arrhythmia and/or significant heart rate changes.  ----------------------------------------- 7:38 PM on 08/31/2023 -----------------------------------------  The patient's daughter is now here and gives some additional history.  She states that the patient has been weak for several days.  He was coughing a lot and had been coughing up and spitting up a bunch  of mucus.  She was concerned for possible pneumonia or UTI.  She states that he has been somewhat weak and slightly confused but not significant altered currently.  She did agree with proceeding with the CT.  CT head is negative for acute findings.  Chest x-ray does show bibasilar opacities.  Urinalysis is clear.  CMP, CBC troponin, and lactate are unremarkable.  I have ordered antibiotics to cover for CAP.  The patient's blood pressure has remained stable with fluids.  Given his hypotension earlier and his weakness and systemic symptoms I think it would be appropriate to admit him to the hospital for further treatment.  I  consulted Dr. Lawence from the hospitalist service; based on our discussion he agrees to evaluate the patient for admission.   FINAL CLINICAL IMPRESSION(S) / ED DIAGNOSES   Final diagnoses:  Weakness  Community acquired pneumonia, unspecified laterality     Rx / DC Orders   ED Discharge Orders     None        Note:  This document was prepared using Dragon voice recognition software and may include unintentional dictation errors.    Jacolyn Pae, MD 08/31/23 [phone]

## 2023-08-31 NOTE — ED Notes (Signed)
 Waiting fro Pharmacy to verify medications before giving due to the volume of medication

## 2023-08-31 NOTE — Assessment & Plan Note (Signed)
 Will continue statin therapy

## 2023-08-31 NOTE — Assessment & Plan Note (Signed)
-   The patient will be placed on supplemental coverage with NovoLog . - Will hold off metformin . - Will resume Neurontin  for diabetic neuropathy.

## 2023-09-01 DIAGNOSIS — Z7401 Bed confinement status: Secondary | ICD-10-CM | POA: Diagnosis not present

## 2023-09-01 DIAGNOSIS — E785 Hyperlipidemia, unspecified: Secondary | ICD-10-CM | POA: Diagnosis not present

## 2023-09-01 DIAGNOSIS — G20A1 Parkinson's disease without dyskinesia, without mention of fluctuations: Secondary | ICD-10-CM | POA: Diagnosis not present

## 2023-09-01 DIAGNOSIS — J189 Pneumonia, unspecified organism: Secondary | ICD-10-CM | POA: Diagnosis not present

## 2023-09-01 DIAGNOSIS — I959 Hypotension, unspecified: Secondary | ICD-10-CM | POA: Diagnosis not present

## 2023-09-01 DIAGNOSIS — R0902 Hypoxemia: Secondary | ICD-10-CM | POA: Diagnosis not present

## 2023-09-01 DIAGNOSIS — R531 Weakness: Secondary | ICD-10-CM | POA: Diagnosis not present

## 2023-09-01 LAB — BASIC METABOLIC PANEL WITH GFR
Anion gap: 7 (ref 5–15)
BUN: 31 mg/dL — ABNORMAL HIGH (ref 8–23)
CO2: 25 mmol/L (ref 22–32)
Calcium: 7.9 mg/dL — ABNORMAL LOW (ref 8.9–10.3)
Chloride: 106 mmol/L (ref 98–111)
Creatinine, Ser: 1.22 mg/dL (ref 0.61–1.24)
GFR, Estimated: 58 mL/min — ABNORMAL LOW (ref >60)
Glucose, Bld: 104 mg/dL — ABNORMAL HIGH (ref 70–99)
Potassium: 4.9 mmol/L (ref 3.5–5.1)
Sodium: 138 mmol/L (ref 135–145)

## 2023-09-01 LAB — CBC
HCT: 27.6 % — ABNORMAL LOW (ref 39.0–52.0)
Hemoglobin: 8.6 g/dL — ABNORMAL LOW (ref 13.0–17.0)
MCH: 31.9 pg (ref 26.0–34.0)
MCHC: 31.2 g/dL (ref 30.0–36.0)
MCV: 102.2 fL — ABNORMAL HIGH (ref 80.0–100.0)
Platelets: 171 K/uL (ref 150–400)
RBC: 2.7 MIL/uL — ABNORMAL LOW (ref 4.22–5.81)
RDW: 14.9 % (ref 11.5–15.5)
WBC: 4.5 K/uL (ref 4.0–10.5)
nRBC: 0 % (ref 0.0–0.2)

## 2023-09-01 LAB — PROCALCITONIN: Procalcitonin: <0.1 ng/mL

## 2023-09-01 MED ORDER — AZITHROMYCIN 250 MG PO TABS: ORAL_TABLET | ORAL | 0 refills | Status: AC

## 2023-09-01 MED ORDER — GUAIFENESIN-DM 100-10 MG/5ML PO SYRP: 5.0000 mL | ORAL_SOLUTION | ORAL | 0 refills | Status: AC | PRN

## 2023-09-01 NOTE — ED Notes (Signed)
 RN spoke with Reena (daughter) regarding pt's dispo.

## 2023-09-01 NOTE — ED Notes (Signed)
 Called [facility] for transport to [facility]  [phone]

## 2023-09-01 NOTE — Discharge Summary (Addendum)
 Physician Discharge Summary   Patient: Jack White MRN: [id number] DOB: 01/29/1941  Admit date:     08/31/2023  Discharge date: 09/01/23  Discharge Physician: Amaryllis Dare   PCP: Diedra Lame, MD   Recommendations at discharge:  Please obtain CBC and BMP on follow-up Patient is being given a course of Zithromax . Follow-up with his primary care provider and neurosurgery  Discharge Diagnoses: Principal Problem:   CAP (community acquired pneumonia) Active Problems:   Hypotension   Generalized weakness   Type 2 diabetes mellitus with chronic kidney disease, without long-term current use of insulin  (HCC)   BPH (benign prostatic hyperplasia)   Parkinson's disease (HCC)   Dyslipidemia   Hospital Course: Taken from H&P.  Jack White is a 83 y.o. Caucasian male with medical history significant for stage III chronic kidney disease, type 2 diabetes mellitus, essential hypertension, peptic ulcer disease and recent admission for subdural hematoma at [facility], s/p craniotomy, who presented to the emergency room with acute onset of generalized weakness and hypotension.  The patient had a large bowel movement and then became hypotensive with a BP of 90s over 40s per EMS.  He admitted to recent cough productive of greenish-yellow sputum with associated wheezing that have been going on over a week now.   On presentation BP 103/44 otherwise stable vital.  Labs seems stable, renal function improved than the prior check with creatinine at 1.35 now. 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 Chest x-ray with increasing bibasilar opacities more on left which can represent atelectasis or pneumonia with trace bilateral pleural effusions.  Patient was admitted for concern of pneumonia and started on ceftriaxone  and Zithromax .  7/7: Vital stable, creatinine continue to improve and  now at 1.22.  CBC stable with no leukocytosis.  Procalcitonin negative.  Patient might have mild vasovagal symptoms after having large bowel movement.  Having mild cough.  Received ceftriaxone  for concern of pneumonia, x-ray findings likely atelectasis as procalcitonin was negative and there was no leukocytosis.  Ceftriaxone  was discontinued and patient was given a course of Zithromax .  He can also use Robitussin as needed.  Pneumonia ruled out.  Patient was living at [facility] for rehab after recent subdural hematoma.  Which seems stable and patient is at his baseline at this time. PT again evaluated him and recommendations remain for SNF.  He will continue with his medications and follow-up with his providers for further assistance.  Patient is being discharged back to [facility],   Consultants: None Procedures performed: None Disposition: Skilled nursing facility Diet recommendation:  Discharge Diet Orders (From admission, onward)     Start     Ordered   09/01/23 0000  Diet - low sodium heart healthy        09/01/23 1252           Carb modified diet DISCHARGE MEDICATION: Allergies as of 09/01/2023       Reactions   Dilaudid [hydromorphone Hcl] Other (See Comments)   Respiratory arrest        Medication List     TAKE these medications    acetaminophen  500 MG tablet Commonly known as: TYLENOL  Take 500-1,000 mg by mouth 2 (two) times daily.   atorvastatin  10 MG tablet Commonly known as: LIPITOR Take 1 tablet (10 mg total) by mouth at bedtime.   azithromycin  250 MG tablet Commonly known as: Zithromax  Z-Pak Take  2 tablets (500 mg) on  Day 1,  followed by 1 tablet (250 mg) once daily on Days 2 through 5.   bacitracin  ointment Apply topically daily. Cleanse L heel wound with NS, apply Bacitracin  ointment to wound bed daily, cover with Telfa nonstick dressing and secure with silicone foam or Kerlix roll gauze whichever is preferred   carbamazepine  200 MG  tablet Commonly known as: TEGRETOL  Take 1 tablet (200 mg total) by mouth 3 (three) times daily.   carbidopa -levodopa  25-100 MG tablet Commonly known as: SINEMET  IR Take 1 tablet by mouth 3 (three) times daily.   clonazePAM  0.5 MG tablet Commonly known as: KLONOPIN  Take 0.5 tablets (0.25 mg total) by mouth daily as needed for anxiety.   diphenhydrAMINE  25 MG tablet Commonly known as: BENADRYL  Take 2 tablets (50 mg total) by mouth at bedtime.   doxazosin  4 MG tablet Commonly known as: CARDURA  Take 4 mg by mouth daily.   ergocalciferol  1.25 MG (50000 UT) capsule Commonly known as: VITAMIN D2 Take 50,000 Units by mouth once a week.   ferrous sulfate  325 (65 FE) MG tablet Take 1 tablet (325 mg total) by mouth daily with breakfast.   fluticasone  50 MCG/ACT nasal spray Commonly known as: FLONASE  Place 2 sprays into both nostrils daily as needed for allergies.   folic acid  1 MG tablet Commonly known as: FOLVITE  Take 1 tablet (1 mg total) by mouth daily.   gabapentin  400 MG capsule Commonly known as: NEURONTIN  Take 400 mg by mouth at bedtime.   guaiFENesin -dextromethorphan 100-10 MG/5ML syrup Commonly known as: ROBITUSSIN DM Take 5 mLs by mouth every 4 (four) hours as needed for cough.   hydrocortisone  cream 1 % Apply 1 Application topically 3 (three) times daily as needed for itching.   hydroxypropyl methylcellulose / hypromellose 2.5 % ophthalmic solution Commonly known as: ISOPTO TEARS / GONIOVISC Place 2 drops into both eyes 4 (four) times daily as needed for dry eyes.   ipratropium-albuterol  0.5-2.5 (3) MG/3ML Soln Commonly known as: DUONEB Take 3 mLs by nebulization every 6 (six) hours as needed (wheezing).   Melatonin 10 MG Tabs Take 10 mg by mouth at bedtime.   metFORMIN  500 MG 24 hr tablet Commonly known as: GLUCOPHAGE -XR Take 500 mg by mouth daily.   metoprolol  tartrate 25 MG tablet Commonly known as: LOPRESSOR  Take 0.5 tablets (12.5 mg total) by mouth  2 (two) times daily.   modafinil  100 MG tablet Commonly known as: PROVIGIL  Take 1 tablet (100 mg total) by mouth daily.   montelukast  10 MG tablet Commonly known as: SINGULAIR  Take 10 mg by mouth at bedtime.   omeprazole 20 MG capsule Commonly known as: PRILOSEC Take 20 mg by mouth daily.   polyethylene glycol 17 g packet Commonly known as: MIRALAX  / GLYCOLAX  Take 17 g by mouth daily as needed.   sennosides 8.8 MG/5ML syrup Commonly known as: SENOKOT Take 5 mLs by mouth at bedtime.   tamsulosin  0.4 MG Caps capsule Commonly known as: FLOMAX  Take 0.4 mg by mouth daily after breakfast.   traMADol  50 MG tablet Commonly known as: ULTRAM  Take 1 tablet (50 mg total) by mouth every 8 (eight) hours as needed for severe pain (pain score 7-10).        Follow-up Information     Diedra Lame, MD. Schedule an appointment as soon as possible for a visit.   Specialty: Family Medicine Contact information: [address] [facility] [place] [address]  [phone]                Discharge Exam: Fredricka Weights   08/31/23 1421  Weight: 99.8 kg   General.  Frail elderly man, in no acute distress. Pulmonary.  Lungs clear bilaterally, normal respiratory effort. CV.  Regular rate and rhythm, no JVD, rub or murmur. Abdomen.  Soft, nontender, nondistended, BS positive. CNS.  Alert and oriented .  No focal neurologic deficit. Extremities.  No edema,  pulses intact and symmetrical. Psychiatry.  Judgment and insight appears normal.   Condition at discharge: stable  The results of significant diagnostics from this hospitalization (including imaging, microbiology, ancillary and laboratory) are listed below for reference.   Imaging Studies: CT Head Wo Contrast Result Date: 08/31/2023 CLINICAL DATA:  Mental status change, unknown cause EXAM: CT HEAD WITHOUT CONTRAST TECHNIQUE: Contiguous axial images were obtained from the base of the  skull through the vertex without intravenous contrast. RADIATION DOSE REDUCTION: This exam was performed according to the departmental dose-optimization program which includes automated exposure control, adjustment of the mA and/or kV according to patient size and/or use of iterative reconstruction technique. COMPARISON:  CT head 08/27/2023, CT head 08/10/2023 FINDINGS: Brain: Patchy and confluent areas of decreased attenuation are noted throughout the deep and periventricular white matter of the cerebral hemispheres bilaterally, compatible with chronic microvascular ischemic disease. No evidence of large-territorial acute infarction. No parenchymal hemorrhage. No mass lesion. No acute extra-axial collection. Subacute stable 9 mm subdural fluid collection (4:33). Stable chronic partially calcified dural thickening/subacute to chronic hematoma anteriorly (4:20). Stable right to left 3 mm midline shift. No hydrocephalus. Basilar cisterns are patent. Vascular: No hyperdense vessel. Atherosclerotic calcifications are present within the cavernous internal carotid arteries. Skull: No acute fracture or focal lesion. Prior right frontal craniotomy. Sinuses/Orbits: Bilateral ethmoid mucosal thickening. Otherwise paranasal sinuses and mastoid air cells are clear. The orbits are unremarkable. Other: None. IMPRESSION: No acute intracranial abnormality. Persistent subacute to chronic subdural findings as above with stable 3 mm right to left midline shift. Electronically Signed   By: Morgane  Naveau M.D.   On: 08/31/2023 18:39   DG Chest 2 View Result Date: 08/31/2023 CLINICAL DATA:  Cough, weakness. EXAM: CHEST - 2 VIEW COMPARISON:  Chest radiograph 08/11/2023 FINDINGS: Lung volumes are low. Cardiomegaly is stable. Unchanged mediastinal contours. Trace bilateral pleural effusions. Increasing bibasilar opacities, more so on the left. No pneumothorax. IMPRESSION: 1. Increasing bibasilar opacities, more so on the left, may  represent atelectasis or pneumonia. 2. Trace bilateral pleural effusions. 3. Stable cardiomegaly. Electronically Signed   By: Andrea Gasman M.D.   On: 08/31/2023 18:23   CT HEAD WO CONTRAST ( ) Result Date: 08/27/2023 CLINICAL DATA:  Subdural hematoma. EXAM: CT HEAD WITHOUT CONTRAST TECHNIQUE: Contiguous axial images were obtained from the base of the skull through the vertex without intravenous contrast. RADIATION DOSE REDUCTION: This exam was performed according to the departmental dose-optimization program which includes automated exposure control, adjustment of the mA and/or kV according to patient size and/or use of iterative reconstruction technique. COMPARISON:  Head CT 08/10/2023 FINDINGS: Brain: A residual hypodense subdural hematoma over the right cerebral convexity has decreased in size, now measuring up to 10 mm in thickness. There is decreased, mild residual mass effect on the right cerebral hemisphere with 3 mm of leftward midline shift (previously 6 mm). An extradural collection subjacent to the right frontal craniotomy has also decreased in size and density with largely resolved associated gas. No definite new intracranial hemorrhage, acute infarct,  or hydrocephalus is evident. There is mild cerebral atrophy. Cerebral white matter hypodensities are nonspecific but compatible with mild chronic small vessel ischemic disease. Vascular: Calcified atherosclerosis at the skull base. No hyperdense vessel. Skull: Right frontal craniotomy. Sinuses/Orbits: Moderate mucosal thickening in the ethmoid sinuses. Mucosal thickening in the included portions of the right greater than left maxillary sinuses. Clear mastoid air cells. Bilateral cataract extraction. Other: None. IMPRESSION: 1. Decreased size of the right cerebral convexity subdural hematoma. Decreased mass effect with 3 mm of leftward midline shift. 2. No new intracranial abnormality. Electronically Signed   By: Dasie Hamburg M.D.   On: 08/27/2023  15:25   DG Abd 1 View Result Date: 08/11/2023 CLINICAL DATA:  Abdominal pain EXAM: ABDOMEN - 1 VIEW COMPARISON:  None Available. FINDINGS: The bowel gas pattern is normal. No radio-opaque calculi or other significant radiographic abnormality are seen. IMPRESSION: Negative. No obstruction residual contrast throughout the distal colon Electronically Signed   By: Franky Chard M.D.   On: 08/11/2023 11:00   DG CHEST PORT 1 VIEW Result Date: 08/11/2023 CLINICAL DATA:  Wheezing EXAM: PORTABLE CHEST 1 VIEW COMPARISON:  July 31, 2023 FINDINGS: Improving pulmonary infiltrates with residual left lower lobe retrocardiac atelectasis without consolidations or pleural effusions Heart normal size IMPRESSION: Improving pulmonary infiltrates with residual left lower lobe retrocardiac atelectasis. Electronically Signed   By: Franky Chard M.D.   On: 08/11/2023 10:59   CT HEAD WO CONTRAST ( ) Result Date: 08/10/2023 EXAM: CT HEAD WITHOUT CONTRAST 08/10/2023 12:05:10 PM TECHNIQUE: CT of the head was performed without the administration of intravenous contrast. Automated exposure control, iterative reconstruction, and/or weight based adjustment of the mA/kV was utilized to reduce the radiation dose to as low as reasonably achievable. COMPARISON: CT head without contrast 07/26/2023. CT head without contrast 07/25/2023. CLINICAL HISTORY: Assess for subdural hematoma enlargement. FINDINGS: BRAIN AND VENTRICLES: Right frontal craniotomy is again noted. Expected evolution of the extra-axial collection is noted. Decreased density is present within the collection with some layering posteriorly. Persistent mass effect is present with effacement of the sulci. Midline shift of 6 mm is similar to the prior exam. No new hemorrhage is present. No cortical infarct is present. ORBITS: Bilateral lens replacements are noted. The globes and orbits are otherwise within normal limits. SINUSES: The visualized paranasal sinuses and mastoid air  cells demonstrate no acute abnormality. SOFT TISSUES AND SKULL: No acute abnormality of the visualized skull or soft tissues. VASCULATURE: Atherosclerotic changes are present within the cavernous internal carotid arteries bilaterally. No hyperdense vessel is present. IMPRESSION: 1. Expected evolution of the extra-axial collection with decreased density and layering posteriorly. Persistent mass effect with effacement of the sulci and midline shift of 6 mm, similar to the prior exam. 2. No new hemorrhage or cortical infarct. Electronically signed by: Lonni Necessary MD 08/10/2023 12:15 PM EDT RP Workstation: [id number]   DG Swallowing Func-Speech Pathology Result Date: 08/05/2023 Table formatting from the original result was not included. Modified Barium Swallow Study Patient Details Name: GENNARO LIZOTTE MRN: [id number] Date of Birth: [birth year] Today's Date: 08/05/2023 HPI/PMH: HPI: 40 yoM with PMH as below, significant for HTN, HLD, DMT2, possible parkinsons disease, trigeminal neuralgia, CKD 3a, and BPH, who presented after mechanical fall on 5/27, not on AC or antiplatelets who developed N/V, AMS, with left sided weakness found to have large right SDH with 1.81m right to left MLS.  Admitted to NSGY and taken for emergent right craniotomy.  Was seen by PCCM while in Neuro ICU, extubated  5/27 evening.  ICU stay complicated by hypoactive delirium, cortrak placed for meds/ nutrition.  pt transferred out of ICU 5/30, CTH unchanged 5/31.  Started to develop increased fever curve.  On 6/1 PCCM re-consulted for development on hypoxia, previously on room air with upper airway congestion.  Daughter reports has not been coughing and clearing secretion. Clinical Impression: Clinical Impression: Pt has an oral dysphagia with esophageal component, but his pharyngeal phase is generally intact. Oral phase is marked by reduced labial seal and disorganized lingual transit. There is anterior loss, intermittent premature  spillage, and oral residue that is mostly anteriorly located on the floor of his mouth and his bilateral buccal cavities. Mastication is slow with rest breaks (also question potential impact from loosely fitting lower dentures). Pharyngeal function was appropriate across all consistencies tested with no aspiration and no pharyngeal residue until the barium tablet, which was transiently lodged in the valleculae and then the UES. Once it entered into the esophagus it remained in the proximal portion of the esophagus, not clearing with multiple trials of purees and thin liquids. It ultimately moved down to his distal esophagus when given sips of warm water . Note that other barium was present in the esophagus underneath the pill with some retrograde flow observed, but it did appear to clear more by the time the pill had descended more. Upon completion of the study, after being transferred back to bed, pt did have regurgitation of the barium he had just consumed. Question if esophageal component could have contributed although his esophagus had been mostly cleared prior to laying him back. His daughter was present for the study and notes that he had been reporting nausea this morning. Recommend starting slowly with clear liquid diet (thin liquids). If he can tolerate this well, anticipate that he will be able to advance up to Dys 2 (finely chopped) diet with use of aspiration and esophageal precautions. Would crush PO medications. RN and MD both updated on recommendations as well. Factors that may increase risk of adverse event in presence of aspiration Noe & Lianne 2021): Factors that may increase risk of adverse event in presence of aspiration Noe & Lianne 2021): Respiratory or GI disease; Reduced cognitive function; Limited mobility; Dependence for feeding and/or oral hygiene Recommendations/Plan: Swallowing Evaluation Recommendations Swallowing Evaluation Recommendations Recommendations: PO diet PO Diet  Recommendation: Clear liquid diet; Thin liquids (Level 0) Liquid Administration via: Cup; Straw Medication Administration: Crushed with puree Supervision: Staff to assist with self-feeding; Full supervision/cueing for swallowing strategies Swallowing strategies  : Slow rate; Small bites/sips; Check for anterior loss; Follow solids with liquids Postural changes: Position pt fully upright for meals; Stay upright 30-60 min after meals Oral care recommendations: Oral care BID (2x/day) Caregiver Recommendations: Have oral suction available Treatment Plan Treatment Plan Treatment recommendations: Therapy as outlined in treatment plan below Follow-up recommendations: Skilled nursing-short term rehab (<3 hours/day) Functional status assessment: Patient has had a recent decline in their functional status and demonstrates the ability to make significant improvements in function in a reasonable and predictable amount of time. Treatment frequency: Min 2x/week Treatment duration: 2 weeks Interventions: Aspiration precaution training; Compensatory techniques; Patient/family education; Trials of upgraded texture/liquids; Diet toleration management by SLP Recommendations Recommendations for follow up therapy are one component of a multi-disciplinary discharge planning process, led by the attending physician.  Recommendations may be updated based on patient status, additional functional criteria and insurance authorization. Assessment: Orofacial Exam: Orofacial Exam Oral Cavity - Dentition: Dentures, top; Dentures, bottom (bottom dentures loose)  Anatomy: Anatomy: Suspected cervical osteophytes; Other (Comment) (naturally positioned in more of a chin tucked position) Boluses Administered: Boluses Administered Boluses Administered: Thin liquids (Level 0); Mildly thick liquids (Level 2, nectar thick); Puree; Solid  Oral Impairment Domain: Oral Impairment Domain Lip Closure: Escape beyond mid-chin Tongue control during bolus hold:  Posterior escape of greater than half of bolus Bolus preparation/mastication: Slow prolonged chewing/mashing with complete recollection Bolus transport/lingual motion: Repetitive/disorganized tongue motion Oral residue: Residue collection on oral structures Location of oral residue : Floor of mouth; Tongue; Lateral sulci Initiation of pharyngeal swallow : Pyriform sinuses (but mostly above the valleculae)  Pharyngeal Impairment Domain: Pharyngeal Impairment Domain Soft palate elevation: No bolus between soft palate (SP)/pharyngeal wall (PW) Laryngeal elevation: Complete superior movement of thyroid  cartilage with complete approximation of arytenoids to epiglottic petiole Anterior hyoid excursion: Complete anterior movement Epiglottic movement: Complete inversion Laryngeal vestibule closure: Complete, no air/contrast in laryngeal vestibule Pharyngeal stripping wave : Present - complete Pharyngeal contraction (A/P view only): N/A Pharyngoesophageal segment opening: Complete distension and complete duration, no obstruction of flow Tongue base retraction: No contrast between tongue base and posterior pharyngeal wall (PPW) Pharyngeal residue: Complete pharyngeal clearance Location of pharyngeal residue: N/A  Esophageal Impairment Domain: Esophageal Impairment Domain Esophageal clearance upright position: Esophageal retention with retrograde flow through the PES Pill: Pill Consistency administered: Thin liquids (Level 0); Puree Thin liquids (Level 0): Impaired (see clinical impressions) Puree: Impaired (see clinical impressions) Penetration/Aspiration Scale Score: Penetration/Aspiration Scale Score 1.  Material does not enter airway: Solid; Puree; Pill 2.  Material enters airway, remains ABOVE vocal cords then ejected out: Thin liquids (Level 0); Mildly thick liquids (Level 2, nectar thick) Compensatory Strategies: Compensatory Strategies Compensatory strategies: No   General Information: Caregiver present: Yes (dtr,  Sharon)  Diet Prior to this Study: NPO; Cortrak/Small bore NG tube   Temperature : Normal   Respiratory Status: WFL   Supplemental O2: None (Room air)   History of Recent Intubation: Yes  Behavior/Cognition: Alert; Cooperative; Pleasant mood; Requires cueing Self-Feeding Abilities: Dependent for feeding Baseline vocal quality/speech: Normal No data recorded No data recorded Exam Limitations: No limitations Goal Planning: Prognosis for improved oropharyngeal function: Good Barriers to Reach Goals: Cognitive deficits No data recorded Patient/Family Stated Goal: family asking for POs for patient Consulted and agree with results and recommendations: Patient; Family member/caregiver; Physician; Nurse (dtr, Reena, present for study) Pain: Pain Assessment Pain Assessment: Faces Faces Pain Scale: 0 Facial Expression: 0 Body Movements: 0 Muscle Tension: 0 Compliance with ventilator (intubated pts.): N/A Vocalization (extubated pts.): 0 CPOT Total: 0 Pain Location: Moans with BLE movement Pain Descriptors / Indicators: Moaning Pain Intervention(s): Monitored during session End of Session: Start Time:SLP Start Time (ACUTE ONLY): 1344 Stop Time: SLP Stop Time (ACUTE ONLY): 1430 Time Calculation:SLP Time Calculation (min) (ACUTE ONLY): 46 min Charges: SLP Evaluations $ SLP Speech Visit: 1 Visit SLP Evaluations $MBS Swallow: 1 Procedure $Swallowing Treatment: 1 Procedure SLP visit diagnosis: SLP Visit Diagnosis: Dysphagia, unspecified (R13.10) Past Medical History: Past Medical History: Diagnosis Date  Acute cholecystitis 05/21/14  BP (high blood pressure) 10/14/2014  Chronic kidney disease (CKD), stage III (moderate) (HCC) 02/15/2014  Colon perforation (HCC) 05/28/2014  Diabetes mellitus without complication (HCC)   [name]'s neuralgia 10/14/2014  Headache   Hypertension   Intractable nausea and vomiting 04/07/2023  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 Lucien  COLOSTOMY TAKEDOWN N/A 10/26/2014  Procedure: COLOSTOMY TAKEDOWN;  Surgeon:  Elgin Laurence III, MD;  Location: [facility] ORS;  Service: General;  Laterality: N/A;  CRANIOTOMY Right 07/21/2023  Procedure: CRANIOTOMY HEMATOMA EVACUATION SUBDURAL;  Surgeon: Lanis Pupa, MD;  Location: [facility] OR;  Service: Neurosurgery;  Laterality: Right;  EXPLORATORY LAPAROTOMY  05/28/2014  Dr. Lucien  LAPAROSCOPIC CHOLECYSTECTOMY  05/21/2014  Dr. Laurence Leita SAILOR., M.A. CCC-SLP Acute Rehabilitation Services Office: [phone] Secure chat preferred 08/05/2023, 3:30 PM   Microbiology: Results for orders placed or performed during the hospital encounter of 07/21/23  MRSA Next Gen by PCR, Nasal     Status: None   Collection Time: 07/22/23  2:25 AM   Specimen: Nasal Mucosa; Nasal Swab  Result Value Ref Range Status   MRSA by PCR Next Gen NOT DETECTED NOT DETECTED Final    Comment: (NOTE) The GeneXpert MRSA Assay (FDA approved for NASAL specimens only), is one component of a comprehensive MRSA colonization surveillance program. It is not intended to diagnose MRSA infection nor to guide or monitor treatment for MRSA infections. Test performance is not FDA approved in patients less than 76 years old. Performed at [facility], [address], [place], [place] [address]   Culture, blood (Routine X 2) w Reflex to ID Panel     Status: None   Collection Time: 07/27/23  6:52 PM   Specimen: BLOOD  Result Value Ref Range Status   Specimen Description BLOOD BLOOD LEFT ARM  Final   Special Requests   Final    AEROBIC BOTTLE ONLY Blood Culture results may not be optimal due to an inadequate volume of blood received in culture bottles   Culture   Final    NO GROWTH 5 DAYS Performed at [facility], [address] [place], [place] [address]    Report Status 08/01/2023 FINAL  Final  Culture, blood (Routine X 2) w Reflex to ID Panel     Status: None   Collection Time: 07/27/23  6:54 PM   Specimen:  BLOOD  Result Value Ref Range Status   Specimen Description BLOOD BLOOD LEFT HAND  Final   Special Requests   Final    AEROBIC BOTTLE ONLY Blood Culture results may not be optimal due to an inadequate volume of blood received in culture bottles   Culture   Final    NO GROWTH 5 DAYS Performed at [facility], [address], [place], [place] [address]    Report Status 08/01/2023 FINAL  Final    Labs: CBC: Recent Labs  Lab 08/31/23 1425 09/01/23 0813  WBC 5.7 4.5  HGB 9.2* 8.6*  HCT 30.4* 27.6*  MCV 101.3* 102.2*  PLT 180 171   Basic Metabolic Panel: Recent Labs  Lab 08/31/23 1425 09/01/23 0813  NA 136 138  K 4.9 4.9  CL 105 106  CO2 24 25  GLUCOSE 155* 104*  BUN 31* 31*  CREATININE 1.35* 1.22  CALCIUM  8.3* 7.9*   Liver Function Tests: Recent Labs  Lab 08/31/23 1425  AST 20  ALT 6  ALKPHOS 103  BILITOT 0.3  PROT 6.1*  ALBUMIN  2.6*   CBG: No results for input(s): GLUCAP in the last 168 hours.  Discharge time spent: greater than 30 minutes.  This record has been created using Conservation officer, historic buildings. Errors have been sought and corrected,but may not always be located. Such creation errors do not reflect on the standard of care.   Signed: Amaryllis Dare, MD [facility] 09/01/2023

## 2023-09-01 NOTE — Hospital Course (Addendum)
 Taken from H&P.  Jack White is a 83 y.o. Caucasian male with medical history significant for stage III chronic kidney disease, type 2 diabetes mellitus, essential hypertension, peptic ulcer disease and recent admission for subdural hematoma at [facility], s/p craniotomy, who presented to the emergency room with acute onset of generalized weakness and hypotension.  The patient had a large bowel movement and then became hypotensive with a BP of 90s over 40s per EMS.  He admitted to recent cough productive of greenish-yellow sputum with associated wheezing that have been going on over a week now.   On presentation BP 103/44 otherwise stable vital.  Labs seems stable, renal function improved than the prior check with creatinine at 1.35 now. 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 Chest x-ray with increasing bibasilar opacities more on left which can represent atelectasis or pneumonia with trace bilateral pleural effusions.  Patient was admitted for concern of pneumonia and started on ceftriaxone  and Zithromax .  7/7: Vital stable, creatinine continue to improve and now at 1.22.  CBC stable with no leukocytosis.  Procalcitonin negative.  Patient might have mild vasovagal symptoms after having large bowel movement.  Having mild cough.  Received ceftriaxone  for concern of pneumonia, x-ray findings likely atelectasis as procalcitonin was negative and there was no leukocytosis.  Ceftriaxone  was discontinued and patient was given a course of Zithromax .  He can also use Robitussin as needed.  Pneumonia ruled out.  Patient was living at [facility] for rehab after recent subdural hematoma.  Which seems stable and patient is at his baseline at this time. PT again evaluated him and recommendations remain for SNF.  He will continue with his medications and follow-up with his providers for further  assistance.  Patient is being discharged back to [place],

## 2023-09-01 NOTE — Evaluation (Signed)
 Occupational Therapy Evaluation Patient Details Name: Jack White MRN: [id number] DOB: 25-Feb-1941 Today's Date: 09/01/2023   History of Present Illness   83 y.o. Caucasian male with medical history significant for stage III chronic kidney disease, type 2 diabetes mellitus, essential hypertension, peptic ulcer disease and recent admission for subdural hematoma at [facility], s/p craniotomy, who presented to the emergency room with acute onset of generalized weakness and hypotension after BM. He admitted to recent cough productive of greenish-yellow sputum with associated wheezing that have been going on over a week now. Work up for CAP.     Clinical Impressions Pt was seen for OT evaluation this date. Prior to hospital admission, pt was completing short term rehab at [facility] and daughter eager for pt to return to complete rehab. Pt presents to acute OT demonstrating impaired ADL performance and functional mobility 2/2 decreased strength, cognition, balance, and activity tolerance (See OT problem list for additional functional deficits). Pt currently requires MOD A for bed mobility and STS transfers with RW + VCs, MAX A for donning shoes EOB, MOD-MAX A for donning pants. Pt tolerated well. Pt would benefit from skilled OT services to address noted impairments and functional limitations (see below for any additional details) in order to maximize safety and independence while minimizing falls risk and caregiver burden. Anticipate the need for follow up OT services upon acute hospital DC.    If plan is discharge home, recommend the following:   Assistance with cooking/housework;Assist for transportation;Help with stairs or ramp for entrance;A lot of help with walking and/or transfers;A lot of help with bathing/dressing/bathroom;Direct supervision/assist for medications management;Supervision due to cognitive status;Direct supervision/assist for financial management (defer)     Functional Status  Assessment   Patient has had a recent decline in their functional status and demonstrates the ability to make significant improvements in function in a reasonable and predictable amount of time.     Equipment Recommendations   Other (comment) (defer)     Recommendations for Other Services         Precautions/Restrictions   Precautions Precautions: Fall Recall of Precautions/Restrictions: Impaired Restrictions Weight Bearing Restrictions Per Provider Order: No     Mobility Bed Mobility Overal bed mobility: Needs Assistance Bed Mobility: Supine to Sit, Sit to Supine     Supine to sit: Mod assist, HOB elevated, Used rails Sit to supine: Mod assist   General bed mobility comments: VC for bed rails/sequencing    Transfers Overall transfer level: Needs assistance Equipment used: Rolling walker (2 wheels) Transfers: Sit to/from Stand Sit to Stand: Mod assist           General transfer comment: VC for sequencing      Balance Overall balance assessment: Needs assistance Sitting-balance support: Feet supported Sitting balance-Leahy Scale: Fair   Postural control: Posterior lean Standing balance support: Bilateral upper extremity supported, Reliant on assistive device for balance Standing balance-Leahy Scale: Poor                             ADL either performed or assessed with clinical judgement   ADL Overall ADL's : Needs assistance/impaired Eating/Feeding: Bed level;Set up Eating/Feeding Details (indicate cue type and reason): able to hold cup and sandwhich to self feed without assist                 Lower Body Dressing: Sit to/from stand;Maximal assistance;Cueing for sequencing Lower Body Dressing Details (indicate cue type and reason): don  pants, shoes                     Vision         Perception         Praxis         Pertinent Vitals/Pain Pain Assessment Pain Assessment: No/denies pain      Extremity/Trunk Assessment Upper Extremity Assessment Upper Extremity Assessment: Generalized weakness   Lower Extremity Assessment Lower Extremity Assessment: Defer to PT evaluation;Generalized weakness       Communication Communication Communication: Impaired Factors Affecting Communication: Hearing impaired   Cognition Arousal: Alert Behavior During Therapy: Flat affect Cognition: Cognition impaired   Orientation impairments: Place, Time, Situation Awareness: Intellectual awareness impaired, Online awareness impaired   Attention impairment (select first level of impairment): Sustained attention Executive functioning impairment (select all impairments): Sequencing, Organization, Problem solving, Reasoning                   Following commands: Impaired Following commands impaired: Follows one step commands inconsistently, Follows one step commands with increased time     Cueing  General Comments   Cueing Techniques: Verbal cues;Tactile cues;Visual cues      Exercises     Shoulder Instructions      Home Living Family/patient expects to be discharged to:: Skilled nursing facility                                 Additional Comments: pt/family anticiapte d/c back to short term rehab      Prior Functioning/Environment                      OT Problem List: Decreased strength;Decreased coordination;Decreased cognition;Decreased activity tolerance;Decreased safety awareness;Impaired balance (sitting and/or standing);Decreased knowledge of use of DME or AE   OT Treatment/Interventions: Self-care/ADL training;Therapeutic exercise;Therapeutic activities;Neuromuscular education;Cognitive remediation/compensation;Visual/perceptual remediation/compensation;Energy conservation;DME and/or AE instruction;Patient/family education;Balance training;Manual therapy      OT Goals(Current goals can be found in the care plan section)   Acute Rehab  OT Goals Patient Stated Goal: go to rehab OT Goal Formulation: With patient/family Time For Goal Achievement: 09/15/23 Potential to Achieve Goals: Good ADL Goals Pt Will Perform Grooming: sitting;with min assist Pt Will Transfer to Toilet: bedside commode;with min assist;stand pivot transfer   OT Frequency:  Min 2X/week    Co-evaluation              AM-PAC OT 6 Clicks Daily Activity     Outcome Measure Help from another person eating meals?: None Help from another person taking care of personal grooming?: A Lot Help from another person toileting, which includes using toliet, bedpan, or urinal?: A Lot Help from another person bathing (including washing, rinsing, drying)?: A Lot Help from another person to put on and taking off regular upper body clothing?: A Lot Help from another person to put on and taking off regular lower body clothing?: A Lot 6 Click Score: 14   End of Session Equipment Utilized During Treatment: Rolling walker (2 wheels) Nurse Communication: Mobility status  Activity Tolerance: Patient tolerated treatment well Patient left: in bed;with call bell/phone within reach;with bed alarm set;with family/visitor present  OT Visit Diagnosis: Unsteadiness on feet (R26.81);Muscle weakness (generalized) (M62.81);History of falling (Z91.81);Other symptoms and signs involving cognitive function                Time: 8794-8769 OT Time Calculation (min): 25 min Charges:  OT General Charges $OT Visit: 1 Visit OT Evaluation $OT Eval Moderate Complexity: 1 Mod OT Treatments $Self Care/Home Management : 8-22 mins  Warren SAUNDERS., MPH, MS, OTR/L ascom [phone] 09/01/23, 1:55 PM

## 2023-09-01 NOTE — TOC CM/SW Note (Signed)
 SW received message and spoke with Amaryllis Dare, MD regarding pt.  Dr. Dare wanted to know if pt could return back to residence at [facility] from ED. SW contacted [employer] at [phone].  Passenger transport manager expressed that pt could return.  Dr. Dare was notified.

## 2023-09-01 NOTE — Progress Notes (Signed)
 PT Cancellation Note  Patient Details Name: Jack White MRN: [id number] DOB: 10/24/1940   Cancelled Treatment:    Reason Eval/Treat Not Completed: Other (comment). Per chart review pt to discharge today. PT to sign off at this time.   Doyal Shams PT, DPT 3:37 PM,09/01/23

## 2023-09-03 DIAGNOSIS — G629 Polyneuropathy, unspecified: Secondary | ICD-10-CM | POA: Diagnosis not present

## 2023-09-03 DIAGNOSIS — M6281 Muscle weakness (generalized): Secondary | ICD-10-CM | POA: Diagnosis not present

## 2023-09-03 DIAGNOSIS — D509 Iron deficiency anemia, unspecified: Secondary | ICD-10-CM | POA: Diagnosis not present

## 2023-09-03 DIAGNOSIS — N400 Enlarged prostate without lower urinary tract symptoms: Secondary | ICD-10-CM | POA: Diagnosis not present

## 2023-09-03 DIAGNOSIS — N183 Chronic kidney disease, stage 3 unspecified: Secondary | ICD-10-CM | POA: Diagnosis not present

## 2023-09-03 DIAGNOSIS — I129 Hypertensive chronic kidney disease with stage 1 through stage 4 chronic kidney disease, or unspecified chronic kidney disease: Secondary | ICD-10-CM | POA: Diagnosis not present

## 2023-09-03 DIAGNOSIS — E440 Moderate protein-calorie malnutrition: Secondary | ICD-10-CM | POA: Diagnosis not present

## 2023-09-03 DIAGNOSIS — L89622 Pressure ulcer of left heel, stage 2: Secondary | ICD-10-CM | POA: Diagnosis not present

## 2023-09-03 DIAGNOSIS — S065X0D Traumatic subdural hemorrhage without loss of consciousness, subsequent encounter: Secondary | ICD-10-CM | POA: Diagnosis not present

## 2023-09-03 DIAGNOSIS — E1122 Type 2 diabetes mellitus with diabetic chronic kidney disease: Secondary | ICD-10-CM | POA: Diagnosis not present

## 2023-09-03 DIAGNOSIS — G4700 Insomnia, unspecified: Secondary | ICD-10-CM | POA: Diagnosis not present

## 2023-09-03 DIAGNOSIS — F05 Delirium due to known physiological condition: Secondary | ICD-10-CM | POA: Diagnosis not present

## 2023-09-03 DIAGNOSIS — E875 Hyperkalemia: Secondary | ICD-10-CM | POA: Diagnosis not present

## 2023-09-03 DIAGNOSIS — E785 Hyperlipidemia, unspecified: Secondary | ICD-10-CM | POA: Diagnosis not present

## 2023-09-03 DIAGNOSIS — D631 Anemia in chronic kidney disease: Secondary | ICD-10-CM | POA: Diagnosis not present

## 2023-09-09 ENCOUNTER — Telehealth: Payer: Self-pay | Admitting: Neurology

## 2023-09-09 NOTE — Telephone Encounter (Signed)
 Pt, Daughter called states Pt. 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 Medicare will not pay if Pt. Can not participate in P.T. Daughter would like Dr. Evonnie to look over medications list to make sure they are ok, please call Daughter back (Pt can not get up from walker) She does not want him kicked out of therapy. Please call back, [employer], [place] will fax the Rx list if not Daughter will bring copy

## 2023-09-10 ENCOUNTER — Other Ambulatory Visit: Payer: Self-pay | Admitting: Neurosurgery

## 2023-09-10 DIAGNOSIS — G629 Polyneuropathy, unspecified: Secondary | ICD-10-CM | POA: Diagnosis not present

## 2023-09-10 DIAGNOSIS — S065X0D Traumatic subdural hemorrhage without loss of consciousness, subsequent encounter: Secondary | ICD-10-CM | POA: Diagnosis not present

## 2023-09-10 DIAGNOSIS — G20A1 Parkinson's disease without dyskinesia, without mention of fluctuations: Secondary | ICD-10-CM | POA: Diagnosis not present

## 2023-09-10 DIAGNOSIS — G250 Essential tremor: Secondary | ICD-10-CM | POA: Diagnosis not present

## 2023-09-10 DIAGNOSIS — S065XAA Traumatic subdural hemorrhage with loss of consciousness status unknown, initial encounter: Secondary | ICD-10-CM

## 2023-09-10 NOTE — Telephone Encounter (Signed)
 Pt's daughter came in and dropped off records for Dr. Evonnie. She is afraid they are going to kick the pt out of the rehab because he cannot participate in PT due to his jerking. She wants to see if Dr. Evonnie can adjust some medications. The facility won't change anything without the Doctor saying to.  The envelope she dropped off is in Dr. Collie box

## 2023-09-10 NOTE — Telephone Encounter (Signed)
 Daughter had a long note sent to me about patient's jerking.  His DaTscan is scheduled for July 25.  She noted that patient had had increased jerking since July 13.  She noted that patient's gabapentin  had been increased and tramadol  had been added.  He is also now on Provigil .  He is also now on clonazepam .  Her note stated that the nurse practitioner at his rehab center lowered the gabapentin  back to 300 mg and stopped his as of this morning.  She wants to know what they can do.  Chelsea, tell her I have reviewed records for as much as I can see (I obviously cannot see rehab records).  Hospital discharge records state that he should be following up with neurosurgery.  His issue was a subdural hematoma, and he really needs to get recommendations from them.  Whoever added the gabapentin  and tramadol  need to be managing that as well.  Unfortunately, given that I was not involved in this hospitalization, those physicians need to be the ones managing this.  I don't prescribe those medications.

## 2023-09-12 NOTE — Telephone Encounter (Signed)
 Called patients daughter and talked at length about her dads meds and Dr. Evonnie recommendations

## 2023-09-19 ENCOUNTER — Encounter (HOSPITAL_COMMUNITY)
Admission: RE | Admit: 2023-09-19 | Discharge: 2023-09-19 | Disposition: A | Discharge status: Home / Self Care | Source: Ambulatory Visit | Attending: Neurology | Admitting: Neurology

## 2023-09-19 ENCOUNTER — Emergency Department (HOSPITAL_COMMUNITY)
Admission: EM | Admit: 2023-09-19 | Discharge: 2023-09-19 | Disposition: A | Discharge status: Home / Self Care | Attending: Emergency Medicine | Admitting: Emergency Medicine

## 2023-09-19 ENCOUNTER — Encounter (HOSPITAL_COMMUNITY): Payer: Self-pay

## 2023-09-19 ENCOUNTER — Emergency Department (HOSPITAL_COMMUNITY)

## 2023-09-19 ENCOUNTER — Other Ambulatory Visit: Payer: Self-pay

## 2023-09-19 DIAGNOSIS — R4182 Altered mental status, unspecified: Secondary | ICD-10-CM | POA: Diagnosis not present

## 2023-09-19 DIAGNOSIS — R251 Tremor, unspecified: Secondary | ICD-10-CM | POA: Insufficient documentation

## 2023-09-19 DIAGNOSIS — E860 Dehydration: Secondary | ICD-10-CM

## 2023-09-19 DIAGNOSIS — R112 Nausea with vomiting, unspecified: Secondary | ICD-10-CM | POA: Diagnosis present

## 2023-09-19 DIAGNOSIS — I6782 Cerebral ischemia: Secondary | ICD-10-CM | POA: Diagnosis not present

## 2023-09-19 LAB — CBG MONITORING, ED
Glucose-Capillary: 145 mg/dL — ABNORMAL HIGH (ref 70–99)
Glucose-Capillary: 128 mg/dL — ABNORMAL HIGH (ref 70–99)

## 2023-09-19 LAB — COMPREHENSIVE METABOLIC PANEL WITH GFR
ALT: <5 U/L (ref 0–44)
AST: 15 U/L (ref 15–41)
Albumin: 2.5 g/dL — ABNORMAL LOW (ref 3.5–5.0)
Alkaline Phosphatase: 97 U/L (ref 38–126)
Anion gap: 8 (ref 5–15)
BUN: 27 mg/dL — ABNORMAL HIGH (ref 8–23)
CO2: 24 mmol/L (ref 22–32)
Calcium: 8.1 mg/dL — ABNORMAL LOW (ref 8.9–10.3)
Chloride: 102 mmol/L (ref 98–111)
Creatinine, Ser: 1.47 mg/dL — ABNORMAL HIGH (ref 0.61–1.24)
GFR, Estimated: 47 mL/min — ABNORMAL LOW (ref >60)
Glucose, Bld: 142 mg/dL — ABNORMAL HIGH (ref 70–99)
Potassium: 4.5 mmol/L (ref 3.5–5.1)
Sodium: 134 mmol/L — ABNORMAL LOW (ref 135–145)
Total Bilirubin: 0.6 mg/dL (ref 0.0–1.2)
Total Protein: 6.2 g/dL — ABNORMAL LOW (ref 6.5–8.1)

## 2023-09-19 LAB — CBC WITH DIFFERENTIAL/PLATELET
Abs Immature Granulocytes: 0.02 K/uL (ref 0.00–0.07)
Basophils Absolute: 0 K/uL (ref 0.0–0.1)
Basophils Relative: 1 %
Eosinophils Absolute: 0.2 K/uL (ref 0.0–0.5)
Eosinophils Relative: 6 %
HCT: 31.1 % — ABNORMAL LOW (ref 39.0–52.0)
Hemoglobin: 9.7 g/dL — ABNORMAL LOW (ref 13.0–17.0)
Immature Granulocytes: 1 %
Lymphocytes Relative: 16 %
Lymphs Abs: 0.7 K/uL (ref 0.7–4.0)
MCH: 31.5 pg (ref 26.0–34.0)
MCHC: 31.2 g/dL (ref 30.0–36.0)
MCV: 101 fL — ABNORMAL HIGH (ref 80.0–100.0)
Monocytes Absolute: 0.3 K/uL (ref 0.1–1.0)
Monocytes Relative: 8 %
Neutro Abs: 2.9 K/uL (ref 1.7–7.7)
Neutrophils Relative %: 68 %
Platelets: 114 K/uL — ABNORMAL LOW (ref 150–400)
RBC: 3.08 MIL/uL — ABNORMAL LOW (ref 4.22–5.81)
RDW: 14 % (ref 11.5–15.5)
WBC: 4.2 K/uL (ref 4.0–10.5)
nRBC: 0 % (ref 0.0–0.2)

## 2023-09-19 LAB — I-STAT CHEM 8, ED
BUN: 27 mg/dL — ABNORMAL HIGH (ref 8–23)
Calcium, Ion: 1.13 mmol/L — ABNORMAL LOW (ref 1.15–1.40)
Chloride: 102 mmol/L (ref 98–111)
Creatinine, Ser: 1.5 mg/dL — ABNORMAL HIGH (ref 0.61–1.24)
Glucose, Bld: 139 mg/dL — ABNORMAL HIGH (ref 70–99)
HCT: 30 % — ABNORMAL LOW (ref 39.0–52.0)
Hemoglobin: 10.2 g/dL — ABNORMAL LOW (ref 13.0–17.0)
Potassium: 4.6 mmol/L (ref 3.5–5.1)
Sodium: 136 mmol/L (ref 135–145)
TCO2: 23 mmol/L (ref 22–32)

## 2023-09-19 LAB — URINALYSIS, W/ REFLEX TO CULTURE (INFECTION SUSPECTED)
Bilirubin Urine: NEGATIVE
Glucose, UA: NEGATIVE mg/dL
Hgb urine dipstick: NEGATIVE
Ketones, ur: NEGATIVE mg/dL
Leukocytes,Ua: NEGATIVE
Nitrite: NEGATIVE
Protein, ur: NEGATIVE mg/dL
Specific Gravity, Urine: 1.01 (ref 1.005–1.030)
pH: 5 (ref 5.0–8.0)

## 2023-09-19 MED ORDER — SODIUM CHLORIDE 0.9 % IV SOLN: INTRAVENOUS | Status: DC

## 2023-09-19 MED ORDER — POTASSIUM IODIDE (ANTIDOTE) 130 MG PO TABS
ORAL_TABLET | ORAL | Status: AC
  Filled 2023-09-19: qty 1

## 2023-09-19 MED ORDER — ONDANSETRON HCL 4 MG/2ML IJ SOLN: 4.0000 mg | Freq: Once | INTRAMUSCULAR | Status: DC

## 2023-09-19 MED ORDER — SODIUM CHLORIDE 0.9 % IV BOLUS
1000.0000 mL | Freq: Once | INTRAVENOUS | Status: AC
  Administered 2023-09-19: 1000 mL via INTRAVENOUS

## 2023-09-19 NOTE — Progress Notes (Signed)
 Received a call from nuc med team stating that patient was experiencing nausea and vomiting, requested APP to assess.  Arrived to patient diaphoretic, vomiting. Learned that he had not yet received the infusion medication for the schedule DAT scan, only the potassium iodide. Family states they believe patient is vomiting d/t taking all morning meds at same time on empty stomach this AM.   Shared case with Dr. Malva. When, I returned to bedside, learned from nursing staff that patient SBP 90, not responding to questions appropriately, and still vomiting. CBG 120. Denies recent falls. Sx improved when placed in supine position from sitting. However, given recent admission for subdural hematoma and hypotension, family and Dr. Malva agree with plan to cancel DAT today and have patient evaluated in the ED.

## 2023-09-19 NOTE — Progress Notes (Signed)
 Nuc Med called to IR nurses station to ask for assistance with pt who was vomiting. This RN and Emmalene SQUIBB, RN arrived to help assess pt in wheelchair. VS obtained, PIV placed. Pt was no longer vomiting, c/o fatigue. No pain present. [place], NP also present to assess. Pt became less responsive and required more prompting to answer questions. No facial droop present, weak and equal strength in extremities, able to follow commands. Pt transferred to stretcher and brought to ED. Report given to [facility], Charity fundraiser. Family updated by [place], NP. Edmunds MD aware. All questions answered by this RN.

## 2023-09-19 NOTE — ED Provider Notes (Signed)
 [place] EMERGENCY DEPARTMENT AT [facility] Provider Note   CSN: [id number] Arrival date & time: 09/19/23  1007     Patient presents with: Nausea and Vomiting   Jack White is a 83 y.o. male.   83 year old male presents with weakness from neurology.  Patient states he has not had anything to eat for the past 2 days.  Got contrast and then had nausea vomiting.  States prior to coming in today, he has been at his baseline.  Denies any medications.  No recent fever or chills.  Patient reportedly was hypotensive transiently but blood pressure has come up without intervention.       Prior to Admission medications   Medication Sig Start Date End Date Taking? Authorizing Provider  acetaminophen  (TYLENOL ) 500 MG tablet Take 500-1,000 mg by mouth 2 (two) times daily.    [provider]  atorvastatin  (LIPITOR) 10 MG tablet Take 1 tablet (10 mg total) by mouth at bedtime. 08/13/23   Patsy Lenis, MD  bacitracin  ointment Apply topically daily. Cleanse L heel wound with NS, apply Bacitracin  ointment to wound bed daily, cover with Telfa nonstick dressing and secure with silicone foam or Kerlix roll gauze whichever is preferred 08/14/23   Patsy Lenis, MD  carbamazepine  (TEGRETOL ) 200 MG tablet Take 1 tablet (200 mg total) by mouth 3 (three) times daily. 08/13/23   Patsy Lenis, MD  carbidopa -levodopa  (SINEMET  IR) 25-100 MG tablet Take 1 tablet by mouth 3 (three) times daily. 11/13/22 11/13/23  [provider]  clonazePAM  (KLONOPIN ) 0.5 MG tablet Take 0.5 tablets (0.25 mg total) by mouth daily as needed for anxiety. 08/13/23   Patsy Lenis, MD  diphenhydrAMINE  (BENADRYL ) 25 MG tablet Take 2 tablets (50 mg total) by mouth at bedtime. 08/13/23   Patsy Lenis, MD  doxazosin  (CARDURA ) 4 MG tablet Take 4 mg by mouth daily. 10/17/14   [provider]  ergocalciferol  (VITAMIN D2) 1.25 MG (50000 UT) capsule Take 50,000 Units by mouth once a week.    [provider]  ferrous sulfate  325 (65 FE) MG tablet Take 1 tablet (325 mg total) by mouth daily with breakfast. 08/14/23   Patsy Lenis, MD  fluticasone  (FLONASE ) 50 MCG/ACT nasal spray Place 2 sprays into both nostrils daily as needed for allergies. 12/10/22 12/10/23  [provider]  folic acid  (FOLVITE ) 1 MG tablet Take 1 tablet (1 mg total) by mouth daily. 08/14/23   Patsy Lenis, MD  gabapentin  (NEURONTIN ) 400 MG capsule Take 400 mg by mouth at bedtime. 08/17/14   [provider]  guaiFENesin -dextromethorphan (ROBITUSSIN DM) 100-10 MG/5ML syrup Take 5 mLs by mouth every 4 (four) hours as needed for cough. 09/01/23   Amin, Sumayya, MD  hydrocortisone  cream 1 % Apply 1 Application topically 3 (three) times daily as needed for itching. 08/13/23   Patsy Lenis, MD  hydroxypropyl methylcellulose / hypromellose (ISOPTO TEARS / GONIOVISC) 2.5 % ophthalmic solution Place 2 drops into both eyes 4 (four) times daily as needed for dry eyes. 08/13/23   Patsy Lenis, MD  ipratropium-albuterol  (DUONEB) 0.5-2.5 (3) MG/3ML SOLN Take 3 mLs by nebulization every 6 (six) hours as needed (wheezing).    [provider]  melatonin 10 MG TABS Take 10 mg by mouth at bedtime. 08/13/23   Patsy Lenis, MD  metFORMIN  (GLUCOPHAGE -XR) 500 MG 24 hr tablet Take 500 mg by mouth daily. 04/14/23 04/13/24  [provider]  metoprolol  tartrate (LOPRESSOR ) 25 MG tablet Take 0.5 tablets (12.5 mg total)  by mouth 2 (two) times daily. 08/13/23   Patsy Lenis, MD  modafinil  (PROVIGIL ) 100 MG tablet Take 1 tablet (100 mg total) by mouth daily. 08/14/23   Patsy Lenis, MD  montelukast  (SINGULAIR ) 10 MG tablet Take 10 mg by mouth at bedtime.    [provider]  omeprazole (PRILOSEC) 20 MG capsule Take 20 mg by mouth daily. 08/17/14   [provider]  polyethylene glycol (MIRALAX  / GLYCOLAX ) 17 g packet Take 17 g by mouth daily as needed. 08/13/23   Patsy Lenis, MD  sennosides  (SENOKOT) 8.8 MG/5ML syrup Take 5 mLs by mouth at bedtime. 08/13/23   Patsy Lenis, MD  tamsulosin  (FLOMAX ) 0.4 MG CAPS capsule Take 0.4 mg by mouth daily after breakfast. 03/05/23 03/04/24  [provider]  traMADol  (ULTRAM ) 50 MG tablet Take 1 tablet (50 mg total) by mouth every 8 (eight) hours as needed for severe pain (pain score 7-10). 08/13/23   Patsy Lenis, MD    Allergies: Dilaudid [hydromorphone hcl]    Review of Systems  All other systems reviewed and are negative.   Updated Vital Signs BP (!) 111/45   Pulse 72   Temp 97.6 F (36.4 C) (Oral)   Resp 16   Ht 1.778 m (5' 10)   Wt 90.7 kg   SpO2 100%   BMI 28.70 kg/m   Physical Exam Vitals and nursing note reviewed.  Constitutional:      General: He is not in acute distress.    Appearance: Normal appearance. He is well-developed. He is not toxic-appearing.  HENT:     Head: Normocephalic and atraumatic.  Eyes:     General: Lids are normal.     Conjunctiva/sclera: Conjunctivae normal.     Pupils: Pupils are equal, round, and reactive to light.  Neck:     Thyroid : No thyroid  mass.     Trachea: No tracheal deviation.  Cardiovascular:     Rate and Rhythm: Normal rate and regular rhythm.     Heart sounds: Normal heart sounds. No murmur heard.    No gallop.  Pulmonary:     Effort: Pulmonary effort is normal. No respiratory distress.     Breath sounds: Normal breath sounds. No stridor. No decreased breath sounds, wheezing, rhonchi or rales.  Abdominal:     General: There is no distension.     Palpations: Abdomen is soft.     Tenderness: There is no abdominal tenderness. There is no rebound.  Musculoskeletal:        General: No tenderness. Normal range of motion.     Cervical back: Normal range of motion and neck supple.  Skin:    General: Skin is warm and dry.     Findings: No abrasion or rash.  Neurological:     Mental Status: He is alert and oriented to person, place, and time. Mental status is at  baseline.     GCS: GCS eye subscore is 4. GCS verbal subscore is 5. GCS motor subscore is 6.     Cranial Nerves: No cranial nerve deficit.     Sensory: No sensory deficit.     Motor: Motor function is intact.  Psychiatric:        Attention and Perception: Attention normal.        Speech: Speech normal.        Behavior: Behavior normal.     (all labs ordered are listed, but only abnormal results are displayed) Labs Reviewed  CBG MONITORING, ED - Abnormal;  Notable for the following components:      Result Value   Glucose-Capillary 145 (*)    All other components within normal limits  CBC WITH DIFFERENTIAL/PLATELET  COMPREHENSIVE METABOLIC PANEL WITH GFR  URINALYSIS, W/ REFLEX TO CULTURE (INFECTION SUSPECTED)  I-STAT CHEM 8, ED    EKG: EKG Interpretation Date/Time:  Friday September 19 2023 10:29:44 EDT Ventricular Rate:  68 PR Interval:  185 QRS Duration:  96 QT Interval:  371 QTC Calculation: 395 R Axis:   -2  Text Interpretation: Sinus rhythm Anterior infarct, old No significant change since last tracing Confirmed by Dasie Faden ([id number]) on 09/19/2023 11:18:11 AM  Radiology: No results found.   Procedures   Medications Ordered in the ED  0.9 %  sodium chloride  infusion (has no administration in time range)  sodium chloride  0.9 % bolus 1,000 mL (has no administration in time range)                                    Medical Decision Making Amount and/or Complexity of Data Reviewed Labs: ordered. Radiology: ordered. ECG/medicine tests: ordered.  Risk Prescription drug management.   Patient's EKG shows sinus rhythm.  Patient given IV fluids here and feels much better.  Urinalysis negative.  CT of head was done due to history of subdural it is improved from prior.  Daughter at bedside states patient is at baseline.  Will discharge home     Final diagnoses:  None    ED Discharge Orders     None          Dasie Faden, MD 09/19/23 1359

## 2023-09-19 NOTE — ED Triage Notes (Signed)
 Pt presents via stretcher from IR where he was planning to have a DAT scan. The patient had received potassium iodide and became ill after this with one episode of vomiting. Per IR nurse the patient was unresponsive and hypotensive for a brief moment as well. The patient is AxOx4 at this time and has not eaten anything for 2 days according to him. VSS.

## 2023-09-19 NOTE — ED Notes (Addendum)
 Updated daughter, Reena, trusted contact as per patient via telephone. She can be reached back at [phone] for pertinent health history/questions regarding baseline. The daughter states this patient is currently residing at [facility] in [place] and will lose his placement if he is not back by this evening. The provider, Dasie, MD,  was notified. The provider was also paged regarding checking on a brain scan.

## 2023-09-24 DIAGNOSIS — E785 Hyperlipidemia, unspecified: Secondary | ICD-10-CM | POA: Diagnosis not present

## 2023-09-24 DIAGNOSIS — I129 Hypertensive chronic kidney disease with stage 1 through stage 4 chronic kidney disease, or unspecified chronic kidney disease: Secondary | ICD-10-CM | POA: Diagnosis not present

## 2023-09-24 DIAGNOSIS — D509 Iron deficiency anemia, unspecified: Secondary | ICD-10-CM | POA: Diagnosis not present

## 2023-09-24 DIAGNOSIS — S065X0D Traumatic subdural hemorrhage without loss of consciousness, subsequent encounter: Secondary | ICD-10-CM | POA: Diagnosis not present

## 2023-09-24 DIAGNOSIS — N400 Enlarged prostate without lower urinary tract symptoms: Secondary | ICD-10-CM | POA: Diagnosis not present

## 2023-09-24 DIAGNOSIS — G20A1 Parkinson's disease without dyskinesia, without mention of fluctuations: Secondary | ICD-10-CM | POA: Diagnosis not present

## 2023-09-24 DIAGNOSIS — F05 Delirium due to known physiological condition: Secondary | ICD-10-CM | POA: Diagnosis not present

## 2023-09-24 DIAGNOSIS — E875 Hyperkalemia: Secondary | ICD-10-CM | POA: Diagnosis not present

## 2023-09-24 DIAGNOSIS — N183 Chronic kidney disease, stage 3 unspecified: Secondary | ICD-10-CM | POA: Diagnosis not present

## 2023-09-24 DIAGNOSIS — E1122 Type 2 diabetes mellitus with diabetic chronic kidney disease: Secondary | ICD-10-CM | POA: Diagnosis not present

## 2023-09-24 DIAGNOSIS — G4700 Insomnia, unspecified: Secondary | ICD-10-CM | POA: Diagnosis not present

## 2023-09-24 DIAGNOSIS — M6281 Muscle weakness (generalized): Secondary | ICD-10-CM | POA: Diagnosis not present

## 2023-09-26 ENCOUNTER — Encounter (HOSPITAL_COMMUNITY)

## 2023-09-26 ENCOUNTER — Other Ambulatory Visit (HOSPITAL_COMMUNITY)

## 2023-09-29 DIAGNOSIS — J309 Allergic rhinitis, unspecified: Secondary | ICD-10-CM | POA: Diagnosis not present

## 2023-09-29 DIAGNOSIS — Z8673 Personal history of transient ischemic attack (TIA), and cerebral infarction without residual deficits: Secondary | ICD-10-CM | POA: Diagnosis not present

## 2023-09-29 DIAGNOSIS — E875 Hyperkalemia: Secondary | ICD-10-CM | POA: Diagnosis not present

## 2023-09-29 DIAGNOSIS — Z9049 Acquired absence of other specified parts of digestive tract: Secondary | ICD-10-CM | POA: Diagnosis not present

## 2023-09-29 DIAGNOSIS — G20A1 Parkinson's disease without dyskinesia, without mention of fluctuations: Secondary | ICD-10-CM | POA: Diagnosis not present

## 2023-09-29 DIAGNOSIS — S065XAD Traumatic subdural hemorrhage with loss of consciousness status unknown, subsequent encounter: Secondary | ICD-10-CM | POA: Diagnosis not present

## 2023-09-29 DIAGNOSIS — N400 Enlarged prostate without lower urinary tract symptoms: Secondary | ICD-10-CM | POA: Diagnosis not present

## 2023-09-29 DIAGNOSIS — E1122 Type 2 diabetes mellitus with diabetic chronic kidney disease: Secondary | ICD-10-CM | POA: Diagnosis not present

## 2023-09-29 DIAGNOSIS — N179 Acute kidney failure, unspecified: Secondary | ICD-10-CM | POA: Diagnosis not present

## 2023-09-29 DIAGNOSIS — E440 Moderate protein-calorie malnutrition: Secondary | ICD-10-CM | POA: Diagnosis not present

## 2023-09-29 DIAGNOSIS — D500 Iron deficiency anemia secondary to blood loss (chronic): Secondary | ICD-10-CM | POA: Diagnosis not present

## 2023-09-29 DIAGNOSIS — G500 Trigeminal neuralgia: Secondary | ICD-10-CM | POA: Diagnosis not present

## 2023-09-29 DIAGNOSIS — Z7984 Long term (current) use of oral hypoglycemic drugs: Secondary | ICD-10-CM | POA: Diagnosis not present

## 2023-09-29 DIAGNOSIS — G4700 Insomnia, unspecified: Secondary | ICD-10-CM | POA: Diagnosis not present

## 2023-09-29 DIAGNOSIS — N429 Disorder of prostate, unspecified: Secondary | ICD-10-CM | POA: Diagnosis not present

## 2023-09-29 DIAGNOSIS — J690 Pneumonitis due to inhalation of food and vomit: Secondary | ICD-10-CM | POA: Diagnosis not present

## 2023-09-29 DIAGNOSIS — Z96659 Presence of unspecified artificial knee joint: Secondary | ICD-10-CM | POA: Diagnosis not present

## 2023-09-29 DIAGNOSIS — N1831 Chronic kidney disease, stage 3a: Secondary | ICD-10-CM | POA: Diagnosis not present

## 2023-09-29 DIAGNOSIS — Z602 Problems related to living alone: Secondary | ICD-10-CM | POA: Diagnosis not present

## 2023-09-29 DIAGNOSIS — I129 Hypertensive chronic kidney disease with stage 1 through stage 4 chronic kidney disease, or unspecified chronic kidney disease: Secondary | ICD-10-CM | POA: Diagnosis not present

## 2023-09-29 DIAGNOSIS — K5909 Other constipation: Secondary | ICD-10-CM | POA: Diagnosis not present

## 2023-09-29 DIAGNOSIS — D485 Neoplasm of uncertain behavior of skin: Secondary | ICD-10-CM | POA: Diagnosis not present

## 2023-09-29 DIAGNOSIS — K219 Gastro-esophageal reflux disease without esophagitis: Secondary | ICD-10-CM | POA: Diagnosis not present

## 2023-09-29 DIAGNOSIS — Z8701 Personal history of pneumonia (recurrent): Secondary | ICD-10-CM | POA: Diagnosis not present

## 2023-09-29 DIAGNOSIS — E782 Mixed hyperlipidemia: Secondary | ICD-10-CM | POA: Diagnosis not present

## 2023-09-30 DIAGNOSIS — G20A1 Parkinson's disease without dyskinesia, without mention of fluctuations: Secondary | ICD-10-CM | POA: Diagnosis not present

## 2023-09-30 DIAGNOSIS — S065XAA Traumatic subdural hemorrhage with loss of consciousness status unknown, initial encounter: Secondary | ICD-10-CM | POA: Diagnosis not present

## 2023-09-30 DIAGNOSIS — N1832 Chronic kidney disease, stage 3b: Secondary | ICD-10-CM | POA: Diagnosis not present

## 2023-10-04 ENCOUNTER — Emergency Department (HOSPITAL_COMMUNITY)

## 2023-10-04 ENCOUNTER — Other Ambulatory Visit: Payer: Self-pay

## 2023-10-04 ENCOUNTER — Encounter (HOSPITAL_COMMUNITY): Payer: Self-pay | Admitting: *Deleted

## 2023-10-04 ENCOUNTER — Emergency Department (HOSPITAL_COMMUNITY)
Admission: EM | Admit: 2023-10-04 | Discharge: 2023-10-04 | Disposition: A | Discharge status: Home / Self Care | Attending: Emergency Medicine | Admitting: Emergency Medicine

## 2023-10-04 DIAGNOSIS — G20C Parkinsonism, unspecified: Secondary | ICD-10-CM | POA: Diagnosis not present

## 2023-10-04 DIAGNOSIS — N400 Enlarged prostate without lower urinary tract symptoms: Secondary | ICD-10-CM | POA: Diagnosis not present

## 2023-10-04 DIAGNOSIS — E1122 Type 2 diabetes mellitus with diabetic chronic kidney disease: Secondary | ICD-10-CM | POA: Diagnosis not present

## 2023-10-04 DIAGNOSIS — I129 Hypertensive chronic kidney disease with stage 1 through stage 4 chronic kidney disease, or unspecified chronic kidney disease: Secondary | ICD-10-CM | POA: Diagnosis not present

## 2023-10-04 DIAGNOSIS — Z79899 Other long term (current) drug therapy: Secondary | ICD-10-CM | POA: Insufficient documentation

## 2023-10-04 DIAGNOSIS — Z7951 Long term (current) use of inhaled steroids: Secondary | ICD-10-CM | POA: Insufficient documentation

## 2023-10-04 DIAGNOSIS — N183 Chronic kidney disease, stage 3 unspecified: Secondary | ICD-10-CM | POA: Diagnosis not present

## 2023-10-04 DIAGNOSIS — N281 Cyst of kidney, acquired: Secondary | ICD-10-CM | POA: Diagnosis not present

## 2023-10-04 DIAGNOSIS — Z7984 Long term (current) use of oral hypoglycemic drugs: Secondary | ICD-10-CM | POA: Diagnosis not present

## 2023-10-04 DIAGNOSIS — R1084 Generalized abdominal pain: Secondary | ICD-10-CM | POA: Insufficient documentation

## 2023-10-04 DIAGNOSIS — K5730 Diverticulosis of large intestine without perforation or abscess without bleeding: Secondary | ICD-10-CM | POA: Diagnosis not present

## 2023-10-04 LAB — COMPREHENSIVE METABOLIC PANEL WITH GFR
ALT: 7 U/L (ref 0–44)
AST: 16 U/L (ref 15–41)
Albumin: 2.7 g/dL — ABNORMAL LOW (ref 3.5–5.0)
Alkaline Phosphatase: 127 U/L — ABNORMAL HIGH (ref 38–126)
Anion gap: 9 (ref 5–15)
BUN: 21 mg/dL (ref 8–23)
CO2: 24 mmol/L (ref 22–32)
Calcium: 8.4 mg/dL — ABNORMAL LOW (ref 8.9–10.3)
Chloride: 101 mmol/L (ref 98–111)
Creatinine, Ser: 1.3 mg/dL — ABNORMAL HIGH (ref 0.61–1.24)
GFR, Estimated: 54 mL/min — ABNORMAL LOW (ref >60)
Glucose, Bld: 150 mg/dL — ABNORMAL HIGH (ref 70–99)
Potassium: 4.6 mmol/L (ref 3.5–5.1)
Sodium: 134 mmol/L — ABNORMAL LOW (ref 135–145)
Total Bilirubin: 0.4 mg/dL (ref 0.0–1.2)
Total Protein: 6.7 g/dL (ref 6.5–8.1)

## 2023-10-04 LAB — TROPONIN I (HIGH SENSITIVITY)
Troponin I (High Sensitivity): 7 ng/L (ref <18)
Troponin I (High Sensitivity): 8 ng/L (ref <18)

## 2023-10-04 LAB — URINALYSIS, ROUTINE W REFLEX MICROSCOPIC
Bacteria, UA: NONE SEEN
Bilirubin Urine: NEGATIVE
Glucose, UA: NEGATIVE mg/dL
Hgb urine dipstick: NEGATIVE
Ketones, ur: NEGATIVE mg/dL
Nitrite: NEGATIVE
Protein, ur: NEGATIVE mg/dL
Specific Gravity, Urine: 1.013 (ref 1.005–1.030)
pH: 7 (ref 5.0–8.0)

## 2023-10-04 LAB — LIPASE, BLOOD: Lipase: 30 U/L (ref 11–51)

## 2023-10-04 LAB — CBC
HCT: 35.5 % — ABNORMAL LOW (ref 39.0–52.0)
Hemoglobin: 11.5 g/dL — ABNORMAL LOW (ref 13.0–17.0)
MCH: 31.5 pg (ref 26.0–34.0)
MCHC: 32.4 g/dL (ref 30.0–36.0)
MCV: 97.3 fL (ref 80.0–100.0)
Platelets: 186 K/uL (ref 150–400)
RBC: 3.65 MIL/uL — ABNORMAL LOW (ref 4.22–5.81)
RDW: 13 % (ref 11.5–15.5)
WBC: 7 K/uL (ref 4.0–10.5)
nRBC: 0 % (ref 0.0–0.2)

## 2023-10-04 MED ORDER — OXYCODONE-ACETAMINOPHEN 5-325 MG PO TABS
1.0000 | ORAL_TABLET | Freq: Once | ORAL | Status: AC
  Administered 2023-10-04: 1 via ORAL

## 2023-10-04 MED ORDER — ONDANSETRON 4 MG PO TBDP
4.0000 mg | ORAL_TABLET | Freq: Once | ORAL | Status: DC | PRN
  Filled 2023-10-04: qty 1

## 2023-10-04 MED ORDER — SODIUM CHLORIDE 0.9 % IV BOLUS
1000.0000 mL | Freq: Once | INTRAVENOUS | Status: AC
  Administered 2023-10-04: 1000 mL via INTRAVENOUS

## 2023-10-04 MED ORDER — ONDANSETRON HCL 4 MG PO TABS: 4.0000 mg | ORAL_TABLET | Freq: Four times a day (QID) | ORAL | 0 refills | Status: AC

## 2023-10-04 MED ORDER — OXYCODONE HCL 5 MG PO TABS
5.0000 mg | ORAL_TABLET | Freq: Once | ORAL | Status: AC
  Administered 2023-10-04: 5 mg via ORAL
  Filled 2023-10-04: qty 1

## 2023-10-04 MED ORDER — IOHEXOL 350 MG/ML SOLN
75.0000 mL | Freq: Once | INTRAVENOUS | Status: AC | PRN
  Administered 2023-10-04: 75 mL via INTRAVENOUS

## 2023-10-04 MED ORDER — OXYCODONE HCL 5 MG PO TABS: 5.0000 mg | ORAL_TABLET | Freq: Four times a day (QID) | ORAL | 0 refills | Status: AC | PRN

## 2023-10-04 MED ORDER — OXYCODONE-ACETAMINOPHEN 5-325 MG PO TABS
ORAL_TABLET | ORAL | Status: AC
  Filled 2023-10-04: qty 1

## 2023-10-04 NOTE — ED Triage Notes (Signed)
 The pt  just arrived back home 9 days ago  from rehab for a head bleed.  He also had a reverse ostomy from an infected gallbladder 10 years ago pt in severe pain

## 2023-10-04 NOTE — ED Provider Notes (Signed)
 [facility] Provider Note   CSN: [id number] Arrival date & time: 10/04/23  1843     Patient presents with: chest abd pain   Jack White is a 83 y.o. male past medical history significant for Parkinson's disease, type 2 diabetes, hypertension, CKD stage III, subdural hematoma status post craniotomy, history of acute cholecystitis with colonic perforation necessitating colostomy status post reversal who presents emergency department for abdominal pain x 1 day.  Patient is accompanied by family members help provide medical history.  Patient's daughter at bedside states that the patient began experiencing generalized abdominal pain beginning at 11 AM which acutely worsened throughout the day.  She states that this abdominal pain was associated with nonbloody and nonbilious emesis.  Patient states he has had 2-3 bowel movements today that were nonbloody that did not relieve his abdominal pain.  Patient states he did receive Tylenol  prior to arrival without relief in abdominal pain.  Patient denies associated chest pain, shortness of breath, back pain, fever at this time.   HPI     Prior to Admission medications   Medication Sig Start Date End Date Taking? Authorizing Provider  ondansetron  (ZOFRAN ) 4 MG tablet Take 1 tablet (4 mg total) by mouth every 6 (six) hours for 5 days. 10/04/23 10/09/23 Yes Nada Chroman, DO  oxyCODONE  (ROXICODONE ) 5 MG immediate release tablet Take 1 tablet (5 mg total) by mouth every 6 (six) hours as needed for up to 3 days for severe pain (pain score 7-10). 10/04/23 10/07/23 Yes Nada Chroman, DO  acetaminophen  (TYLENOL ) 500 MG tablet Take 500-1,000 mg by mouth 2 (two) times daily.    [provider]  atorvastatin  (LIPITOR) 10 MG tablet Take 1 tablet (10 mg total) by mouth at bedtime. 08/13/23   Patsy Lenis, MD  bacitracin  ointment Apply topically daily. Cleanse L heel wound with NS, apply Bacitracin  ointment to  wound bed daily, cover with Telfa nonstick dressing and secure with silicone foam or Kerlix roll gauze whichever is preferred 08/14/23   Patsy Lenis, MD  carbamazepine  (TEGRETOL ) 200 MG tablet Take 1 tablet (200 mg total) by mouth 3 (three) times daily. 08/13/23   Patsy Lenis, MD  carbidopa -levodopa  (SINEMET  IR) 25-100 MG tablet Take 1 tablet by mouth 3 (three) times daily. 11/13/22 11/13/23  [provider]  clonazePAM  (KLONOPIN ) 0.5 MG tablet Take 0.5 tablets (0.25 mg total) by mouth daily as needed for anxiety. 08/13/23   Patsy Lenis, MD  diphenhydrAMINE  (BENADRYL ) 25 MG tablet Take 2 tablets (50 mg total) by mouth at bedtime. 08/13/23   Patsy Lenis, MD  doxazosin  (CARDURA ) 4 MG tablet Take 4 mg by mouth daily. 10/17/14   [provider]  ergocalciferol  (VITAMIN D2) 1.25 MG (50000 UT) capsule Take 50,000 Units by mouth once a week.    [provider]  ferrous sulfate  325 (65 FE) MG tablet Take 1 tablet (325 mg total) by mouth daily with breakfast. 08/14/23   Patsy Lenis, MD  fluticasone  (FLONASE ) 50 MCG/ACT nasal spray Place 2 sprays into both nostrils daily as needed for allergies. 12/10/22 12/10/23  [provider]  folic acid  (FOLVITE ) 1 MG tablet Take 1 tablet (1 mg total) by mouth daily. 08/14/23   Patsy Lenis, MD  gabapentin  (NEURONTIN ) 400 MG capsule Take 400 mg by mouth at bedtime. 08/17/14   [provider]  guaiFENesin -dextromethorphan (ROBITUSSIN DM) 100-10 MG/5ML syrup Take 5 mLs by mouth every 4 (four) hours as needed for cough. 09/01/23  Amin, Sumayya, MD  hydrocortisone  cream 1 % Apply 1 Application topically 3 (three) times daily as needed for itching. 08/13/23   Patsy Lenis, MD  hydroxypropyl methylcellulose / hypromellose (ISOPTO TEARS / GONIOVISC) 2.5 % ophthalmic solution Place 2 drops into both eyes 4 (four) times daily as needed for dry eyes. 08/13/23   Patsy Lenis, MD  ipratropium-albuterol  (DUONEB) 0.5-2.5 (3) MG/3ML  SOLN Take 3 mLs by nebulization every 6 (six) hours as needed (wheezing).    [provider]  melatonin 10 MG TABS Take 10 mg by mouth at bedtime. 08/13/23   Patsy Lenis, MD  metFORMIN  (GLUCOPHAGE -XR) 500 MG 24 hr tablet Take 500 mg by mouth daily. 04/14/23 04/13/24  [provider]  metoprolol  tartrate (LOPRESSOR ) 25 MG tablet Take 0.5 tablets (12.5 mg total) by mouth 2 (two) times daily. 08/13/23   Patsy Lenis, MD  modafinil  (PROVIGIL ) 100 MG tablet Take 1 tablet (100 mg total) by mouth daily. 08/14/23   Patsy Lenis, MD  montelukast  (SINGULAIR ) 10 MG tablet Take 10 mg by mouth at bedtime.    [provider]  omeprazole (PRILOSEC) 20 MG capsule Take 20 mg by mouth daily. 08/17/14   [provider]  polyethylene glycol (MIRALAX  / GLYCOLAX ) 17 g packet Take 17 g by mouth daily as needed. 08/13/23   Patsy Lenis, MD  sennosides (SENOKOT) 8.8 MG/5ML syrup Take 5 mLs by mouth at bedtime. 08/13/23   Patsy Lenis, MD  tamsulosin  (FLOMAX ) 0.4 MG CAPS capsule Take 0.4 mg by mouth daily after breakfast. 03/05/23 03/04/24  [provider]  traMADol  (ULTRAM ) 50 MG tablet Take 1 tablet (50 mg total) by mouth every 8 (eight) hours as needed for severe pain (pain score 7-10). 08/13/23   Patsy Lenis, MD    Allergies: Dilaudid [hydromorphone hcl]    Review of Systems  Updated Vital Signs BP (!) 149/66   Pulse (!) 59   Temp (P) 97.9 F (36.6 C) (Oral)   Resp 13   Ht 5' 10 (1.778 m)   Wt 90.7 kg   SpO2 98%   BMI 28.69 kg/m   Physical Exam Constitutional:      General: He is not in acute distress.    Appearance: He is not ill-appearing.  Cardiovascular:     Rate and Rhythm: Normal rate and regular rhythm.     Heart sounds: Normal heart sounds. No murmur heard. Pulmonary:     Effort: Pulmonary effort is normal. No tachypnea.     Breath sounds: Normal breath sounds.  Abdominal:     General: Bowel sounds are normal. There is no distension.      Palpations: Abdomen is soft.     Tenderness: There is generalized abdominal tenderness. There is no right CVA tenderness, left CVA tenderness, guarding or rebound.     Hernia: No hernia is present.     Comments: Surgical scars well healed without surrounding edema, erythema or purulent drainage  Musculoskeletal:     Right lower leg: No edema.     Left lower leg: No edema.     Comments: Spontaneous movement of bilateral upper and lower extremities  Neurological:     Mental Status: He is alert.  Psychiatric:        Behavior: Behavior is cooperative.     (all labs ordered are listed, but only abnormal results are displayed) Labs Reviewed  COMPREHENSIVE METABOLIC PANEL WITH GFR - Abnormal; Notable for the following components:      Result Value  Sodium 134 (*)    Glucose, Bld 150 (*)    Creatinine, Ser 1.30 (*)    Calcium  8.4 (*)    Albumin  2.7 (*)    Alkaline Phosphatase 127 (*)    GFR, Estimated 54 (*)    All other components within normal limits  CBC - Abnormal; Notable for the following components:   RBC 3.65 (*)    Hemoglobin 11.5 (*)    HCT 35.5 (*)    All other components within normal limits  URINALYSIS, ROUTINE W REFLEX MICROSCOPIC - Abnormal; Notable for the following components:   Leukocytes,Ua TRACE (*)    All other components within normal limits  LIPASE, BLOOD  TROPONIN I (HIGH SENSITIVITY)  TROPONIN I (HIGH SENSITIVITY)    EKG: EKG Interpretation Date/Time:  Saturday October 04 2023 20:40:19 EDT Ventricular Rate:  63 PR Interval:  201 QRS Duration:  94 QT Interval:  389 QTC Calculation: 399 R Axis:   -37  Text Interpretation: Sinus rhythm Left ventricular hypertrophy Anterior Q waves, possibly due to LVH No significant change since last tracing Confirmed by Patt Alm DEL [phone]) on 10/04/2023 8:41:57 PM  Radiology: CT ABDOMEN PELVIS W CONTRAST Result Date: 10/04/2023 CLINICAL DATA:  Generalized abdominal pain, nausea, vomiting EXAM: CT ABDOMEN AND PELVIS  WITH CONTRAST TECHNIQUE: Multidetector CT imaging of the abdomen and pelvis was performed using the standard protocol following bolus administration of intravenous contrast. RADIATION DOSE REDUCTION: This exam was performed according to the departmental dose-optimization program which includes automated exposure control, adjustment of the mA and/or kV according to patient size and/or use of iterative reconstruction technique. CONTRAST:  75mL OMNIPAQUE  IOHEXOL  350 MG/ML SOLN COMPARISON:  04/07/2023 FINDINGS: Lower chest: No acute abnormality. Mild cardiomegaly. Trace right pleural effusion Hepatobiliary: Status post cholecystectomy. Mild extrahepatic biliary ductal dilation with the extrahepatic bile duct measuring up to 10-11 mm Diane likely represents post cholecystectomy change. No enhancing intrahepatic mass. Pancreas: Unremarkable Spleen: Unremarkable Adrenals/Urinary Tract: Adrenal glands are unremarkable. Mild bilateral renal cortical atrophy. Multiple simple cortical cysts are seen within the right kidney for which no follow-up imaging is recommended. The kidneys are otherwise unremarkable. Bladder unremarkable. Stomach/Bowel: Surgical changes of a partial colectomy involving the hepatic flexure is identified with an anastomotic staple line noted within the right mid abdomen. There is no evidence of obstruction or focal inflammation. Appendix normal. Moderate sigmoid diverticulosis. No free intraperitoneal gas or fluid. Vascular/Lymphatic: Extensive aortoiliac atherosclerotic calcification. Particularly prominent atherosclerotic calcification noted involving the renal artery origins bilaterally, however, the degree of stenosis is not well assessed on this non arteriographic examination. Portosystemic collateralization is seen between the inferior mesenteric vein and left gonadal vein. No pathologic adenopathy within the abdomen and pelvis. Reproductive: Mild prostatic hypertrophy Other: Surgical change  right-sided colostomy are identified. No abdominal wall hernia. Musculoskeletal: Osseous structures are age-appropriate. Moderate degenerative changes are seen within the visualized thoracolumbar spine. 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 IMPRESSION: 1. No acute intra-abdominal pathology identified. No definite radiographic explanation for the patient's reported symptoms. 2. Mild cardiomegaly. Trace right pleural effusion. 3. Mild extrahepatic biliary ductal dilation, likely representing post cholecystectomy change. Correlation with liver enzymes may be helpful to exclude an obstructive process. 4. Moderate sigmoid diverticulosis without superimposed acute inflammatory change. 5. Extensive aortoiliac atherosclerotic calcification. Particularly prominent atherosclerotic calcification involving the renal artery origins bilaterally, however, the degree of stenosis is not well assessed on this non arteriographic examination. If there is clinical evidence of hemodynamically significant renal artery stenosis,  this could be better assessed with CT arteriography. 6. 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 This may reflect the sequela of a portal venous hypertension or reflect changes related to remote trauma or inflammation. Aortic Atherosclerosis (ICD10-I70.0). Electronically Signed   By: Dorethia Molt M.D.   On: 10/04/2023 22:25     Procedures   Medications Ordered in the ED  ondansetron  (ZOFRAN -ODT) disintegrating tablet 4 mg (4 mg Oral Incomplete 10/04/23 1905)  oxyCODONE -acetaminophen  (PERCOCET/ROXICET) 5-325 MG per tablet 1 tablet (1 tablet Oral Given 10/04/23 1912)  sodium chloride  0.9 % bolus 1,000 mL (0 mLs Intravenous Stopped 10/04/23 2138)  iohexol  (OMNIPAQUE ) 350 MG/ML injection 75 mL (75 mLs Intravenous Contrast Given 10/04/23 2209)  oxyCODONE  (Oxy IR/ROXICODONE )  immediate release tablet 5 mg (5 mg Oral Given 10/04/23 2251)                                    Medical Decision Making Amount and/or Complexity of Data Reviewed Radiology: ordered.  Risk Prescription drug management.   On initial evaluation patient is hemodynamically stable, afebrile and not in acute distress.  Based upon patient's history and physical examination findings differential diagnosis includes pancreatitis, SBO/LBO, biliary pathology, electrolyte abnormality, gastroenteritis, dehydration, ACS/MI.  Laboratory studies obtained as well as CT abdomen and pelvis.  Patient was given antiemetics and pain control.  Patient given fluid bolus secondary to nausea and vomiting.  Patient's presentation less likely SBO/LBO as patient has had multiple bowel movements today and is passing flatus.  Patient's laboratories studies reviewed without evidence of leukocytosis, anemia, severe electrolyte abnormalities or AKI.  Lipase within normal limits.patient's presentation less likely ACS/MI as patient has nonischemic EKG findings and troponins are within normal limits, patient denies chest pain. CT abdomen and pelvis without evidence of acute intra-abdominal findings. Upon patient reevaluation he remains HDS, not in acute distress with the ability to tolerate PO. Shared decision making held with family at bedside who agree with patients discharge at this time. Patient was given strict return precautions and information regarding follow up with general surgery.      Final diagnoses:  Generalized abdominal pain    ED Discharge Orders          Ordered    oxyCODONE  (ROXICODONE ) 5 MG immediate release tablet  Every 6 hours PRN        10/04/23 2238    ondansetron  (ZOFRAN ) 4 MG tablet  Every 6 hours        10/04/23 2238            Lavanda Bolster DO Emergency Medicine PGY2    Bolster Lavanda, DO 10/04/23 2350    Patt Alm Macho, MD 10/05/23 2212

## 2023-10-04 NOTE — Discharge Instructions (Signed)
 Thank you for allowing us  to care for you today.  Please be sure to call the number for [facility] to ensure outpatient follow-up.  Please take pain medications as needed for severe abdominal pain.  Please return to the emergency department if you experience worsening pain, shortness of breath, chest pain, inability to tolerate anything by mouth secondary to nausea and vomiting , pass out or believe you need emergent medical care.  We hope you feel better soon.

## 2023-10-04 NOTE — ED Triage Notes (Signed)
 The pt is c/o epigastric pain since 1400 new onset  followed by nausea vomiting and having a bm did not clear the pain

## 2023-10-05 DIAGNOSIS — I129 Hypertensive chronic kidney disease with stage 1 through stage 4 chronic kidney disease, or unspecified chronic kidney disease: Secondary | ICD-10-CM | POA: Diagnosis not present

## 2023-10-05 DIAGNOSIS — N179 Acute kidney failure, unspecified: Secondary | ICD-10-CM | POA: Diagnosis not present

## 2023-10-05 DIAGNOSIS — M47817 Spondylosis without myelopathy or radiculopathy, lumbosacral region: Secondary | ICD-10-CM | POA: Diagnosis not present

## 2023-10-05 DIAGNOSIS — E1122 Type 2 diabetes mellitus with diabetic chronic kidney disease: Secondary | ICD-10-CM | POA: Diagnosis not present

## 2023-10-05 DIAGNOSIS — K5730 Diverticulosis of large intestine without perforation or abscess without bleeding: Secondary | ICD-10-CM | POA: Diagnosis not present

## 2023-10-05 DIAGNOSIS — K56609 Unspecified intestinal obstruction, unspecified as to partial versus complete obstruction: Secondary | ICD-10-CM | POA: Diagnosis not present

## 2023-10-05 DIAGNOSIS — N189 Chronic kidney disease, unspecified: Secondary | ICD-10-CM | POA: Diagnosis not present

## 2023-10-05 DIAGNOSIS — R109 Unspecified abdominal pain: Secondary | ICD-10-CM | POA: Diagnosis not present

## 2023-10-05 DIAGNOSIS — G20A1 Parkinson's disease without dyskinesia, without mention of fluctuations: Secondary | ICD-10-CM | POA: Diagnosis not present

## 2023-10-05 DIAGNOSIS — R5381 Other malaise: Secondary | ICD-10-CM | POA: Diagnosis not present

## 2023-10-05 DIAGNOSIS — E119 Type 2 diabetes mellitus without complications: Secondary | ICD-10-CM | POA: Diagnosis not present

## 2023-10-05 DIAGNOSIS — K56699 Other intestinal obstruction unspecified as to partial versus complete obstruction: Secondary | ICD-10-CM | POA: Diagnosis not present

## 2023-10-05 DIAGNOSIS — R10813 Right lower quadrant abdominal tenderness: Secondary | ICD-10-CM | POA: Diagnosis not present

## 2023-10-05 DIAGNOSIS — J90 Pleural effusion, not elsewhere classified: Secondary | ICD-10-CM | POA: Diagnosis not present

## 2023-10-05 DIAGNOSIS — K6389 Other specified diseases of intestine: Secondary | ICD-10-CM | POA: Diagnosis not present

## 2023-10-05 DIAGNOSIS — G4700 Insomnia, unspecified: Secondary | ICD-10-CM | POA: Diagnosis not present

## 2023-10-05 DIAGNOSIS — K439 Ventral hernia without obstruction or gangrene: Secondary | ICD-10-CM | POA: Diagnosis not present

## 2023-10-05 DIAGNOSIS — Z4682 Encounter for fitting and adjustment of non-vascular catheter: Secondary | ICD-10-CM | POA: Diagnosis not present

## 2023-10-05 DIAGNOSIS — R112 Nausea with vomiting, unspecified: Secondary | ICD-10-CM | POA: Diagnosis not present

## 2023-10-06 DIAGNOSIS — N400 Enlarged prostate without lower urinary tract symptoms: Secondary | ICD-10-CM | POA: Diagnosis not present

## 2023-10-06 DIAGNOSIS — Z9049 Acquired absence of other specified parts of digestive tract: Secondary | ICD-10-CM | POA: Diagnosis not present

## 2023-10-06 DIAGNOSIS — N179 Acute kidney failure, unspecified: Secondary | ICD-10-CM | POA: Diagnosis not present

## 2023-10-06 DIAGNOSIS — I129 Hypertensive chronic kidney disease with stage 1 through stage 4 chronic kidney disease, or unspecified chronic kidney disease: Secondary | ICD-10-CM | POA: Diagnosis not present

## 2023-10-06 DIAGNOSIS — R3589 Other polyuria: Secondary | ICD-10-CM | POA: Diagnosis not present

## 2023-10-06 DIAGNOSIS — E1122 Type 2 diabetes mellitus with diabetic chronic kidney disease: Secondary | ICD-10-CM | POA: Diagnosis not present

## 2023-10-06 DIAGNOSIS — G20A1 Parkinson's disease without dyskinesia, without mention of fluctuations: Secondary | ICD-10-CM | POA: Diagnosis not present

## 2023-10-06 DIAGNOSIS — R54 Age-related physical debility: Secondary | ICD-10-CM | POA: Diagnosis not present

## 2023-10-06 DIAGNOSIS — E785 Hyperlipidemia, unspecified: Secondary | ICD-10-CM | POA: Diagnosis not present

## 2023-10-06 DIAGNOSIS — R5381 Other malaise: Secondary | ICD-10-CM | POA: Diagnosis not present

## 2023-10-06 DIAGNOSIS — R300 Dysuria: Secondary | ICD-10-CM | POA: Diagnosis not present

## 2023-10-06 DIAGNOSIS — K439 Ventral hernia without obstruction or gangrene: Secondary | ICD-10-CM | POA: Diagnosis not present

## 2023-10-06 DIAGNOSIS — N1831 Chronic kidney disease, stage 3a: Secondary | ICD-10-CM | POA: Diagnosis not present

## 2023-10-06 DIAGNOSIS — G4700 Insomnia, unspecified: Secondary | ICD-10-CM | POA: Diagnosis not present

## 2023-10-06 DIAGNOSIS — Z8711 Personal history of peptic ulcer disease: Secondary | ICD-10-CM | POA: Diagnosis not present

## 2023-10-06 DIAGNOSIS — K56699 Other intestinal obstruction unspecified as to partial versus complete obstruction: Secondary | ICD-10-CM | POA: Diagnosis not present

## 2023-10-06 DIAGNOSIS — G500 Trigeminal neuralgia: Secondary | ICD-10-CM | POA: Diagnosis not present

## 2023-10-06 DIAGNOSIS — K565 Intestinal adhesions [bands], unspecified as to partial versus complete obstruction: Secondary | ICD-10-CM | POA: Diagnosis not present

## 2023-10-08 ENCOUNTER — Encounter (HOSPITAL_COMMUNITY): Admission: RE | Admit: 2023-10-08 | Source: Ambulatory Visit

## 2023-10-13 DIAGNOSIS — G20A1 Parkinson's disease without dyskinesia, without mention of fluctuations: Secondary | ICD-10-CM | POA: Diagnosis not present

## 2023-10-13 DIAGNOSIS — E1122 Type 2 diabetes mellitus with diabetic chronic kidney disease: Secondary | ICD-10-CM | POA: Diagnosis not present

## 2023-10-13 DIAGNOSIS — D500 Iron deficiency anemia secondary to blood loss (chronic): Secondary | ICD-10-CM | POA: Diagnosis not present

## 2023-10-13 DIAGNOSIS — E875 Hyperkalemia: Secondary | ICD-10-CM | POA: Diagnosis not present

## 2023-10-13 DIAGNOSIS — I129 Hypertensive chronic kidney disease with stage 1 through stage 4 chronic kidney disease, or unspecified chronic kidney disease: Secondary | ICD-10-CM | POA: Diagnosis not present

## 2023-10-13 DIAGNOSIS — N1831 Chronic kidney disease, stage 3a: Secondary | ICD-10-CM | POA: Diagnosis not present

## 2023-10-13 DIAGNOSIS — S065XAD Traumatic subdural hemorrhage with loss of consciousness status unknown, subsequent encounter: Secondary | ICD-10-CM | POA: Diagnosis not present

## 2023-10-14 DIAGNOSIS — C44622 Squamous cell carcinoma of skin of right upper limb, including shoulder: Secondary | ICD-10-CM | POA: Diagnosis not present

## 2023-10-14 DIAGNOSIS — D0461 Carcinoma in situ of skin of right upper limb, including shoulder: Secondary | ICD-10-CM | POA: Diagnosis not present

## 2023-10-22 ENCOUNTER — Encounter (HOSPITAL_COMMUNITY): Payer: Self-pay

## 2023-10-22 ENCOUNTER — Encounter (HOSPITAL_COMMUNITY)
Admission: RE | Admit: 2023-10-22 | Discharge: 2023-10-22 | Disposition: A | Discharge status: Home / Self Care | Source: Ambulatory Visit | Attending: Neurology | Admitting: Neurology

## 2023-10-22 ENCOUNTER — Telehealth: Payer: Self-pay | Admitting: Neurology

## 2023-10-22 DIAGNOSIS — R251 Tremor, unspecified: Secondary | ICD-10-CM | POA: Insufficient documentation

## 2023-10-22 MED ORDER — POTASSIUM IODIDE (ANTIDOTE) 130 MG PO TABS
ORAL_TABLET | ORAL | Status: AC
  Filled 2023-10-22: qty 1

## 2023-10-22 MED ORDER — IOFLUPANE I 123 185 MBQ/2.5ML IV SOLN
4.1000 | Freq: Once | INTRAVENOUS | Status: DC | PRN
  Filled 2023-10-22: qty 5

## 2023-10-22 NOTE — Telephone Encounter (Signed)
 Pt.s daughter cld to get sooner appt, was to have f/u so I was able to move Pt up to 11/03/23. He is experiencing jerks that causes him to need help.She would like to know if any Rx needs to [facility] or what to do in the meantime pls call Reena

## 2023-10-23 NOTE — Telephone Encounter (Signed)
 Pt.s daughter cld again as they went to [facility]  and Pt. Neck pushing forward due to posture would not allow completion and he is in a wheelchair. Daughter asking for new order with standing equipment with chair. She also stated the info in the previous note and would like a call back on next steps prior to appt

## 2023-10-23 NOTE — Telephone Encounter (Signed)
 Called [facility] and spoke to [place] about yesterday and the inability to perform the Dat Scan. Franky let me know that they tried everything possible but patient was sos stiff that his neck could not lay flat on [facility] table to perform procedure. It was stated to me that patient had a 30-40 degree angle to his neck forward projecting. They mentioned trying [facility] hospital as they have better equipment than we do. This is the second time that the patient has tried to perform the Dat Scan the first time they thought he was having an adverse reaction to the radiotracer. He was just very dehydrated so this is the second attempt at preforming the procedure. Patietns Daughter Reena is very upset indicating that she is going to call a supervisor at [facility] because they could clearly see the first time he was there and even yesterday when he went in at 9 for the injection that his neck was that way. 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 Patient is no longer on Tramadol  and the daughter feels that has helped. They have had to give him a half of a klonopin  to help with the shaking. He is still having the jerking and at this point is in pain so they have also given him 1/2 a percocet and he is still on Gabapentin  they feel that it is not causing any problem. Home Health will not administer PT do to patients condition. They are afraid patient will be bed bound in two weeks as he was able to walk and is now wheelchair bound needing complete help to get up and down. Patients daughter Reena is  frustrated that they do not have the data to treat this patient with medication . Patients dauhgter is wanting to come in as soon as possible to see Dr. Evonnie to speak to her on next steps with [facility] or what can be done for her dad.

## 2023-10-28 NOTE — Telephone Encounter (Signed)
 I scheduled patient for the 18th at 9:30 in a 60 min slot Dr. Evonnie

## 2023-10-28 NOTE — Telephone Encounter (Signed)
 They are wanting to keep this appt for 11/03/2023. He is doing better and is having Physical therapy, does not want to change appt time. Please advise.

## 2023-10-30 ENCOUNTER — Ambulatory Visit

## 2023-11-03 ENCOUNTER — Ambulatory Visit: Admitting: Neurology

## 2023-11-04 DIAGNOSIS — G20A1 Parkinson's disease without dyskinesia, without mention of fluctuations: Secondary | ICD-10-CM | POA: Diagnosis not present

## 2023-11-04 DIAGNOSIS — K56609 Unspecified intestinal obstruction, unspecified as to partial versus complete obstruction: Secondary | ICD-10-CM | POA: Diagnosis not present

## 2023-11-04 DIAGNOSIS — R2681 Unsteadiness on feet: Secondary | ICD-10-CM | POA: Diagnosis not present

## 2023-11-07 ENCOUNTER — Encounter (HOSPITAL_COMMUNITY): Payer: Self-pay | Admitting: Neurology

## 2023-11-07 ENCOUNTER — Other Ambulatory Visit: Payer: Self-pay

## 2023-11-07 DIAGNOSIS — R251 Tremor, unspecified: Secondary | ICD-10-CM

## 2023-11-11 ENCOUNTER — Ambulatory Visit: Payer: Self-pay | Admitting: Neurology

## 2023-11-11 ENCOUNTER — Encounter: Payer: Self-pay | Admitting: Neurology

## 2023-11-11 ENCOUNTER — Ambulatory Visit (HOSPITAL_COMMUNITY)
Admission: RE | Admit: 2023-11-11 | Discharge: 2023-11-11 | Disposition: A | Discharge status: Home / Self Care | Source: Ambulatory Visit | Attending: Neurology | Admitting: Neurology

## 2023-11-11 ENCOUNTER — Ambulatory Visit (HOSPITAL_COMMUNITY)

## 2023-11-11 DIAGNOSIS — R251 Tremor, unspecified: Secondary | ICD-10-CM | POA: Diagnosis not present

## 2023-11-11 DIAGNOSIS — R2681 Unsteadiness on feet: Secondary | ICD-10-CM | POA: Diagnosis not present

## 2023-11-11 DIAGNOSIS — R531 Weakness: Secondary | ICD-10-CM | POA: Diagnosis not present

## 2023-11-11 MED ORDER — IOFLUPANE I 123 185 MBQ/2.5ML IV SOLN
4.7600 | Freq: Once | INTRAVENOUS | Status: AC
Start: 2023-11-11 — End: 2023-11-11
  Administered 2023-11-11: 4.76 via INTRAVENOUS
  Filled 2023-11-11: qty 5

## 2023-11-11 MED ORDER — POTASSIUM IODIDE (ANTIDOTE) 130 MG PO TABS
ORAL_TABLET | ORAL | Status: AC
  Filled 2023-11-11: qty 1

## 2023-11-11 NOTE — Progress Notes (Unsigned)
 Assessment/Plan:    1.  Parkinson's disease, by history  - DaTscan  completed on November 11, 2023 demonstrated some asymmetric areas, but the DaTscan  was overall felt to be normal.  I personally reviewed the scan and agree that it was unremarkable with strong bilateral comma shaped areas.  - Patient was seen and examined off levodopa  and ***  -he is a [place] vet but has not gone through [place] for this.  We discussed VA services and VA connected disability, but patient states that his [place] records are not correct and states that he has never been to [place], so this may be a challenge for him.  - I agree with the physical therapy.  2.  Trigeminal neuralgia  - Patient is on fairly high-dose carbamazepine .  He is following with [facility].  Limiting this medication to as well as dose as possible would be beneficial, especially in this age group.  In addition, trigeminal neuralgia tends to wax and wane, and I often will take patients off of the medication when they are doing better, so as not to dose escalate.  He is no longer symptomatic and I think we could go ahead and start backing off on the dose.  Will decrease the carbamazepine  to 200 mg 3 times per day and hopefully be able to wean it further soon.  -DaTscan   -protein interaction with levodopa   3.  Diabetic peripheral neuropathy  - Contributes to gait instability.  - I agree with [facility] that we would like to avoid use of nortriptyline .  - Patient currently on gabapentin , 300 mg at bed.  I wonder if he is contributing to possible myoclonus  4.  Myoclonus  - While he had some degree of tremor today, he really had more myoclonus than tremor  -gabapentin  in combination with renal insufficiency can contribute to myoclonus.  His gabapentin  was just increased on Friday, and he thought that the movements got worse since Friday.  His primary care is prescribing this.  I would probably recommend that we try to taper him  off of the gabapentin .  He was already on Cymbalta  for the treatment of neuropathy.    Subjective:   Jack White was seen today in the movement disorders clinic for follow-up.  DaTscan  was completed November 11, 2023 and it was felt that there was some asymmetric activity on the left compared to the right, but overall the radiologist felt that activity was normal.  I reviewed that scan and agree.  Patient previously did not look particularly parkinsonian, but I had seen him on levodopa .  Therefore, I asked him to hold levodopa  for today's visit.  He has been off of it for 24 hours.  Current movement meds: carbidopa /levodopa  25/100, 8am/1pm/10pm   ALLERGIES:   Allergies  Allergen Reactions   Dilaudid [Hydromorphone Hcl] Other (See Comments)    Respiratory arrest    CURRENT MEDICATIONS:  No outpatient medications have been marked as taking for the 11/13/23 encounter (Appointment) with Lizzie An, Asberry RAMAN, DO.     Objective:   VITALS:   There were no vitals filed for this visit.   GEN:  The patient appears stated age and is in NAD. HEENT:  Normocephalic, atraumatic.  The mucous membranes are moist. The superficial temporal arteries are without ropiness or tenderness. CV:  RRR Lungs:  CTAB Neck/HEME:  There are no carotid bruits bilaterally.  Neurological examination:  Orientation: The patient is alert and oriented x3.  Cranial nerves: There is  good facial symmetry. Extraocular muscles are intact. The visual fields are full to confrontational testing. The speech is fluent and clear. Soft palate rises symmetrically and there is no tongue deviation. Hearing is intact to conversational tone. Sensation: Sensation is intact to light and pinprick throughout (facial, trunk, extremities).  Sensation is markedly decreased in a distal fashion. There is no extinction with double simultaneous stimulation. There is no sensory dermatomal level identified. Motor: Strength is 5/5 in the bilateral  upper and lower extremities.   Shoulder shrug is equal and symmetric.  There is no pronator drift. Deep tendon reflexes: Deep tendon reflexes are 2-/4 at the bilateral biceps, triceps, brachioradialis, 1/4 at the bilateral patella and absent at the bilateral achilles. Plantar responses are downgoing bilaterally.  Movement examination: Tone: There is normal tone in the bilateral upper extremities.  The tone in the lower extremities is normal.  Abnormal movements: He has no significant degree of rest tremor.  He does have some myoclonic jerks.  He has mild postural tremor. Coordination:  There is no decremation with RAM's, with any form of RAMS, including alternating supination and pronation of the forearm, hand opening and closing, finger taps, heel taps and toe taps.  Gait and Station: The patient requires 2 person assist (mild) to get out of the chair.  He is given his walker.  He is forward flexed, but not shuffling.  He is slightly short stepped.   I have reviewed and interpreted the following labs independently   Chemistry      Component Value Date/Time   NA 134 (L) 10/04/2023 1914   NA 138 06/25/2014 0453   K 4.6 10/04/2023 1914   K 4.5 06/25/2014 0453   CL 101 10/04/2023 1914   CL 110 06/25/2014 0453   CO2 24 10/04/2023 1914   CO2 24 06/25/2014 0453   BUN 21 10/04/2023 1914   BUN 17 06/25/2014 0453   CREATININE 1.30 (H) 10/04/2023 1914   CREATININE 1.18 06/25/2014 0453      Component Value Date/Time   CALCIUM  8.4 (L) 10/04/2023 1914   CALCIUM  7.8 (L) 06/25/2014 0453   ALKPHOS 127 (H) 10/04/2023 1914   ALKPHOS 96 06/22/2014 1734   AST 16 10/04/2023 1914   AST 28 06/22/2014 1734   ALT 7 10/04/2023 1914   ALT 24 06/22/2014 1734   BILITOT 0.4 10/04/2023 1914   BILITOT 0.4 06/22/2014 1734      Lab Results  Component Value Date   TSH 9.129 (H) 04/07/2023   Lab Results  Component Value Date   WBC 7.0 10/04/2023   HGB 11.5 (L) 10/04/2023   HCT 35.5 (L) 10/04/2023   MCV  97.3 10/04/2023   PLT 186 10/04/2023     Total time spent on today's visit was *** minutes, including both face-to-face time and nonface-to-face time.  Time included that spent on review of records (prior notes available to me/labs/imaging if pertinent), discussing treatment and goals, answering patient's questions and coordinating care.  Cc:  Diedra Lame, MD

## 2023-11-13 ENCOUNTER — Ambulatory Visit: Admitting: Neurology

## 2023-11-13 VITALS — BP 132/82 | HR 78 | Ht 75.0 in | Wt 199.0 lb

## 2023-11-13 DIAGNOSIS — G253 Myoclonus: Secondary | ICD-10-CM | POA: Diagnosis not present

## 2023-11-13 DIAGNOSIS — M436 Torticollis: Secondary | ICD-10-CM

## 2023-11-13 DIAGNOSIS — M542 Cervicalgia: Secondary | ICD-10-CM | POA: Diagnosis not present

## 2023-11-13 DIAGNOSIS — M5416 Radiculopathy, lumbar region: Secondary | ICD-10-CM | POA: Diagnosis not present

## 2023-11-13 DIAGNOSIS — M545 Low back pain, unspecified: Secondary | ICD-10-CM | POA: Diagnosis not present

## 2023-11-13 DIAGNOSIS — R2681 Unsteadiness on feet: Secondary | ICD-10-CM

## 2023-11-17 ENCOUNTER — Telehealth: Payer: Self-pay | Admitting: Neurology

## 2023-11-17 NOTE — Telephone Encounter (Signed)
 Pt.s daughter called as Pt is doing much better without Gabapentin  and wants to discuss if MRI is still needed

## 2023-11-18 NOTE — Telephone Encounter (Signed)
 I spoke with patients daughter and patient is doing so much better without the Gabapentin . She would like to wait a week or two to determine if his symptoms are are all cleared up. He is doing home health and she is hoping she can get him to outpatient PT with the way he is getting better every day

## 2023-11-19 ENCOUNTER — Ambulatory Visit (HOSPITAL_COMMUNITY)

## 2023-12-02 ENCOUNTER — Ambulatory Visit (HOSPITAL_COMMUNITY)
Admission: RE | Admit: 2023-12-02 | Discharge: 2023-12-02 | Disposition: A | Source: Ambulatory Visit | Attending: Neurology | Admitting: Neurology

## 2023-12-02 ENCOUNTER — Telehealth: Payer: Self-pay | Admitting: Neurology

## 2023-12-02 ENCOUNTER — Ambulatory Visit (HOSPITAL_COMMUNITY)
Admission: RE | Admit: 2023-12-02 | Discharge: 2023-12-02 | Disposition: A | Discharge status: Home / Self Care | Source: Ambulatory Visit | Attending: Neurology | Admitting: Neurology

## 2023-12-02 DIAGNOSIS — M51369 Other intervertebral disc degeneration, lumbar region without mention of lumbar back pain or lower extremity pain: Secondary | ICD-10-CM | POA: Diagnosis not present

## 2023-12-02 DIAGNOSIS — M50322 Other cervical disc degeneration at C5-C6 level: Secondary | ICD-10-CM | POA: Diagnosis not present

## 2023-12-02 DIAGNOSIS — M5031 Other cervical disc degeneration,  high cervical region: Secondary | ICD-10-CM | POA: Diagnosis not present

## 2023-12-02 DIAGNOSIS — M542 Cervicalgia: Secondary | ICD-10-CM

## 2023-12-02 DIAGNOSIS — M5416 Radiculopathy, lumbar region: Secondary | ICD-10-CM | POA: Insufficient documentation

## 2023-12-02 DIAGNOSIS — M48061 Spinal stenosis, lumbar region without neurogenic claudication: Secondary | ICD-10-CM | POA: Diagnosis not present

## 2023-12-02 DIAGNOSIS — M436 Torticollis: Secondary | ICD-10-CM | POA: Insufficient documentation

## 2023-12-02 DIAGNOSIS — G253 Myoclonus: Secondary | ICD-10-CM | POA: Diagnosis not present

## 2023-12-02 DIAGNOSIS — M4802 Spinal stenosis, cervical region: Secondary | ICD-10-CM

## 2023-12-02 DIAGNOSIS — M4312 Spondylolisthesis, cervical region: Secondary | ICD-10-CM | POA: Diagnosis not present

## 2023-12-02 DIAGNOSIS — M50323 Other cervical disc degeneration at C6-C7 level: Secondary | ICD-10-CM | POA: Diagnosis not present

## 2023-12-02 DIAGNOSIS — M47812 Spondylosis without myelopathy or radiculopathy, cervical region: Secondary | ICD-10-CM

## 2023-12-02 DIAGNOSIS — M4316 Spondylolisthesis, lumbar region: Secondary | ICD-10-CM | POA: Diagnosis not present

## 2023-12-02 DIAGNOSIS — M545 Low back pain, unspecified: Secondary | ICD-10-CM | POA: Insufficient documentation

## 2023-12-02 DIAGNOSIS — M47816 Spondylosis without myelopathy or radiculopathy, lumbar region: Secondary | ICD-10-CM | POA: Diagnosis not present

## 2023-12-02 NOTE — Telephone Encounter (Signed)
 Pt.s daughter stated MRI's are getting done, but he has days were he can not walk. She just wanted to update you and Dr. Evonnie

## 2023-12-03 ENCOUNTER — Other Ambulatory Visit: Payer: Self-pay

## 2023-12-03 ENCOUNTER — Ambulatory Visit: Payer: Self-pay | Admitting: Neurology

## 2023-12-03 DIAGNOSIS — M5416 Radiculopathy, lumbar region: Secondary | ICD-10-CM

## 2023-12-03 DIAGNOSIS — R2681 Unsteadiness on feet: Secondary | ICD-10-CM

## 2023-12-03 DIAGNOSIS — M4802 Spinal stenosis, cervical region: Secondary | ICD-10-CM

## 2023-12-03 DIAGNOSIS — G253 Myoclonus: Secondary | ICD-10-CM

## 2023-12-03 NOTE — Progress Notes (Unsigned)
 Referring Physician:  Evonnie Asberry RAMAN, DO [address] [place]  [address] [place],  [place] [address]  Primary Physician:  Diedra Lame, MD  History of Present Illness: 12/04/2023 [name] has a history of HTN, multiple gastric ulcers, DM, CKD stage 3, BPH, hyperlipidemia, CVA with left sided weakness, MI, SDH.   His daughter Reena helps with history.   Over last 3-5 months, he's had a progressive decline in his ability to walk. In general, he can walk pretty well with walker 2-3 days and then he struggles and cannot walk much at all for the other 4-5 days of the week.   On bad days, he has weakness in his legs. He has some weakness and dexterity issues with his hands, but his legs are worse. He notes shaking in his arms and legs. His left leg is worse. He's had some left sided weakness since his stroke, especially in his left foot.    He has intermittent neck pain when he turns his head he can feel a pop. He has intermittent LBP that is worse prolonged sitting or standing. These pains are tolerable.  History of right craniotomy in June 2025 for acute SDH ([facility]).   Tobacco use: Does not smoke.   Bowel/Bladder Dysfunction: 2 month history of urinary incontinence at night, no bowel issues. No perineal numbness.   Conservative measures:  Physical therapy: he's been in HHPT Multimodal medical therapy including regular antiinflammatories:  Gabapentin , Tramadol  Injections: no epidural steroid injections  Past Surgery: no spine surgery  The symptoms are causing a significant impact on the patient's life.   Review of Systems:  A 10 point review of systems is negative, except for the pertinent positives and negatives detailed in the HPI.  Past Medical History: Past Medical History:  Diagnosis Date   Acute cholecystitis 05/21/2014   Arthritis    BP (high blood pressure) 10/14/2014   Cancer (HCC)    skin   Chronic kidney disease (CKD), stage III (moderate) (HCC)  02/15/2014   Colon perforation (HCC) 05/28/2014   Diabetes mellitus without complication (HCC)    [name]'s neuralgia 10/14/2014   Headache    Hypertension    Intractable nausea and vomiting 04/07/2023   MI, old    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   Subdural hematoma (HCC) 2025    Past Surgical History: Past Surgical History:  Procedure Laterality Date   COLOSTOMY  05/28/2014   Dr. Lucien   COLOSTOMY TAKEDOWN N/A 10/26/2014   Procedure: COLOSTOMY TAKEDOWN;  Surgeon: Elgin Laurence III, MD;  Location: [facility] ORS;  Service: General;  Laterality: N/A;   CRANIOTOMY Right 07/21/2023   Procedure: CRANIOTOMY HEMATOMA EVACUATION SUBDURAL;  Surgeon: Lanis Pupa, MD;  Location: [facility] OR;  Service: Neurosurgery;  Laterality: Right;   EXPLORATORY LAPAROTOMY  05/28/2014   Dr. Lucien   LAPAROSCOPIC CHOLECYSTECTOMY  05/21/2014   Dr. Laurence    Allergies: Allergies as of 12/04/2023 - Review Complete 11/13/2023  Allergen Reaction Noted   Dilaudid [hydromorphone hcl] Other (See Comments) 06/24/2014    Medications: Outpatient Encounter Medications as of 12/04/2023  Medication Sig   acetaminophen  (TYLENOL ) 500 MG tablet Take 500-1,000 mg by mouth 2 (two) times daily.   atorvastatin  (LIPITOR) 10 MG tablet Take 1 tablet (10 mg total) by mouth at bedtime.   carbamazepine  (TEGRETOL ) 200 MG tablet Take 1 tablet (200 mg total) by mouth 3 (three) times daily.   carbidopa -levodopa  (SINEMET  IR) 25-100 MG tablet Take 1 tablet by mouth 3 (three) times daily.  diphenhydrAMINE  (BENADRYL ) 25 MG tablet Take 2 tablets (50 mg total) by mouth at bedtime.   doxazosin  (CARDURA ) 4 MG tablet Take 4 mg by mouth daily.   DULoxetine  (CYMBALTA ) 60 MG capsule Take 60 mg by mouth daily.   ergocalciferol  (VITAMIN D2) 1.25 MG (50000 UT) capsule Take 50,000 Units by mouth once a week.   ferrous sulfate  325 (65 FE) MG tablet Take 1 tablet (325 mg total) by mouth daily with breakfast.   fluticasone  (FLONASE ) 50 MCG/ACT nasal  spray Place 2 sprays into both nostrils daily as needed for allergies.   folic acid  (FOLVITE ) 1 MG tablet Take 1 tablet (1 mg total) by mouth daily.   gabapentin  (NEURONTIN ) 300 MG capsule Take 300 mg by mouth at bedtime.   guaiFENesin -dextromethorphan (ROBITUSSIN DM) 100-10 MG/5ML syrup Take 5 mLs by mouth every 4 (four) hours as needed for cough.   hydrocortisone  cream 1 % Apply 1 Application topically 3 (three) times daily as needed for itching.   hydroxypropyl methylcellulose / hypromellose (ISOPTO TEARS / GONIOVISC) 2.5 % ophthalmic solution Place 2 drops into both eyes 4 (four) times daily as needed for dry eyes.   ipratropium-albuterol  (DUONEB) 0.5-2.5 (3) MG/3ML SOLN Take 3 mLs by nebulization every 6 (six) hours as needed (wheezing).   melatonin 10 MG TABS Take 10 mg by mouth at bedtime.   metFORMIN  (GLUCOPHAGE -XR) 500 MG 24 hr tablet Take 500 mg by mouth daily.   modafinil  (PROVIGIL ) 100 MG tablet Take 1 tablet (100 mg total) by mouth daily.   montelukast  (SINGULAIR ) 10 MG tablet Take 10 mg by mouth at bedtime.   omeprazole (PRILOSEC) 20 MG capsule Take 20 mg by mouth daily.   polyethylene glycol (MIRALAX  / GLYCOLAX ) 17 g packet Take 17 g by mouth daily as needed.   propranolol (INDERAL) 20 MG tablet Take 20 mg by mouth 2 (two) times daily.   sennosides (SENOKOT) 8.8 MG/5ML syrup Take 5 mLs by mouth at bedtime.   tamsulosin  (FLOMAX ) 0.4 MG CAPS capsule Take 0.4 mg by mouth daily after breakfast.   [DISCONTINUED] bacitracin  ointment Apply topically daily. Cleanse L heel wound with NS, apply Bacitracin  ointment to wound bed daily, cover with Telfa nonstick dressing and secure with silicone foam or Kerlix roll gauze whichever is preferred   No facility-administered encounter medications on file as of 12/04/2023.    Social History: Social History   Tobacco Use   Smoking status: Never   Smokeless tobacco: Never  Vaping Use   Vaping status: Never Used  Substance Use Topics   Alcohol   use: No    Alcohol /week: 0.0 standard drinks of alcohol    Drug use: No    Family Medical History: Family History  Problem Relation Age of Onset   Diabetes Mother    Hypertension Mother    Alzheimer's disease Mother    Heart disease Father    Diabetes Sister     Physical Examination: Vitals:   12/04/23 0959  BP: 120/68    General: Patient is well developed, well nourished, calm, collected, and in no apparent distress. Attention to examination is appropriate.  Respiratory: Patient is breathing without any difficulty.   NEUROLOGICAL:     Awake, alert, oriented to person, place, and time.  Speech is clear and fluent. Fund of knowledge is appropriate.   Cranial Nerves: Pupils equal round and reactive to light.  Facial tone is symmetric.    No cervical or lumbar tenderness.   No abnormal lesions on exposed skin.  Strength: Side Biceps Triceps Deltoid Interossei Grip Wrist Ext. Wrist Flex.  R 5 5 5 5 5 5 5   L 5 5 5 5 5 5 5    Side Iliopsoas Quads Hamstring PF DF EHL  R 5 5 5 5 5 5   L 5 5 5 5 3 3    Reflexes are 2+ and symmetric at the biceps, brachioradialis, patella and achilles.   Hoffman's is absent.  Clonus is not present.   Bilateral upper and lower extremity sensation is intact to light touch.     No pain with IR/ER of both hips.   He is in a WC. Gait not tested.     Medical Decision Making  Imaging: Cervical MRI dated 12/02/23:  FINDINGS: The craniocervical junction is normal.   There is no significant bone marrow signal abnormality.   The cervical spinal cord is normal.   C2-C3: There is ankylosis of the vertebral bodies. There is ankylosis of the right facet joint. No spinal stenosis or foraminal stenosis   C3-C4: There is mild degenerative disc disease. There is severe bilateral facet arthropathy. There is a disc bulge with effacement of the thecal sac without compression of the cord, moderate spinal stenosis. There is moderate bilateral neural  foraminal stenosis   C4-C5: There is moderate degenerative disc disease. There is a mild disc bulge with partial effacement of the thecal sac, mild spinal stenosis. There is severe bilateral facet arthropathy with moderate bilateral neural foraminal stenosis   C5-C6: There is moderate degenerative disc disease with a disc bulge and retrolisthesis. There is mild bilateral facet arthropathy. There is effacement of the thecal sac with slight indention of the cord, severe spinal stenosis. There are prominent foraminal spurs with severe bilateral neural foraminal stenosis   C6-C7: There is severe degenerative disc disease with mild disc bulge with partial effacement of the thecal sac, mild spinal stenosis. There is a severe left neural foraminal stenosis due to a prominent foraminal spur. Mild facet arthropathy on the right.   C7-T1: There is mild degenerative disc disease and facet arthropathy. No spinal stenosis or foraminal stenosis   IMPRESSION: Cervical spondylosis   1. There is ankylosis of the C2 and C3 vertebral bodies and right C2-C3 facet 2. There is severe bilateral facet arthropathy at C3-4 with a disc bulge causing moderate spinal stenosis. There is moderate bilateral neural foraminal stenosis 3. There is moderate degenerative disc disease at C4-5 with severe bilateral facet arthropathy and moderate bilateral neural foraminal stenosis 4. There is moderate degenerative disc disease at C 5 6 with a disc bulge and retrolisthesis. There is severe spinal stenosis with slight indention of the cord. There is severe bilateral neural foraminal stenosis due to prominent neural foraminal spurs 5. Severe degenerative disc disease at C6-7 with severe left neural foraminal stenosis due to a prominent foraminal spur.     Electronically Signed   By: Nancyann Burns M.D.   On: 12/03/2023 09:35   Lumbar MRI dated 12/02/23:  FINDINGS: Bone marrow: No significant abnormality   Conus  and cauda equina: No significant abnormality   Paraspinal tissues: No significant abnormality   L1-L2: There is moderate degenerative disc disease with a disc bulge. There is moderate facet arthropathy with facet enlargement and mild spinal stenosis. There is a moderate stenosis of the left neural foramen   L2-L3: There is moderate degenerative disc disease with a disc bulge. There is moderate facet arthropathy with facet enlargement and moderate spinal stenosis. There is mild bilateral  neural foraminal stenosis   L3-L4: There is moderate degenerative disc disease. Mild facet arthropathy. There is no significant spinal stenosis. There is moderate bilateral neural foraminal stenosis due to lateral bulging of the disc and facet arthropathy   L4-L5: The disc is normal. Mild facet arthropathy. 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 There is no spinal stenosis. There is severe bilateral neural foraminal stenosis   IMPRESSION: 1. 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 The degenerative disc disease has progressed compared with the prior study from 2013. The spondylolisthesis is unchanged. 2. Moderate spinal stenosis at L2-3 due to disc bulge and facet arthropathy. This is slightly worse compared with the prior study     Electronically Signed   By: Nancyann Burns M.D.   On: 12/03/2023 09:40  I have personally reviewed the images and agree with the above interpretation.  Assessment and Plan: Over last 3-5 months, he's had a progressive decline in his ability to walk. In general, he can walk pretty well with walker 2-3 days and then he struggles and cannot walk much at all for the other 4-5 days of the week.   On bad days, he has weakness in his legs. He has  some weakness and dexterity issues with his hands, but his legs are worse. He notes shaking in his arms and legs. His left leg is worse. He's had some left sided weakness since his stroke, especially in his left foot.    He has known cervical spondylosis with multilevel foraminal stenosis. He has severe central stenosis C5-C6 and moderate central stenosis C3-C4.   He also has known lumbar spondylosis with moderate central stenosis L2-L3 and slip L5-S1 with DDD and severe bilateral foraminal stenosis.   I think his gait/balance issues are coming from cervical stenosis at C5-C6.   Treatment options discussed with patient and following plan made:   - Recommend he follow up with Dr. Claudene to discuss further treatment options.  - His daughter Reena asks about doing a phone or MyChart visit with Dr. Claudene. Will discuss with him and call her with follow up. - She also wanted to be sure we will get clearance for any surgery from his PCP.    I spent a total of 45 minutes in face-to-face and non-face-to-face activities related to this patient's care today including review of outside records, review of imaging, review of symptoms, physical exam, discussion of differential diagnosis, discussion of treatment options, and documentation.   Thank you for involving me in the care of this patient.   Glade Boys PA-C Dept. of Neurosurgery

## 2023-12-04 ENCOUNTER — Ambulatory Visit (INDEPENDENT_AMBULATORY_CARE_PROVIDER_SITE_OTHER): Admitting: Orthopedic Surgery

## 2023-12-04 ENCOUNTER — Encounter: Payer: Self-pay | Admitting: Orthopedic Surgery

## 2023-12-04 VITALS — BP 120/68 | Wt 198.0 lb

## 2023-12-04 DIAGNOSIS — M47816 Spondylosis without myelopathy or radiculopathy, lumbar region: Secondary | ICD-10-CM | POA: Diagnosis not present

## 2023-12-04 DIAGNOSIS — M47812 Spondylosis without myelopathy or radiculopathy, cervical region: Secondary | ICD-10-CM | POA: Diagnosis not present

## 2023-12-04 DIAGNOSIS — M4802 Spinal stenosis, cervical region: Secondary | ICD-10-CM

## 2023-12-04 DIAGNOSIS — G959 Disease of spinal cord, unspecified: Secondary | ICD-10-CM

## 2023-12-04 DIAGNOSIS — M48061 Spinal stenosis, lumbar region without neurogenic claudication: Secondary | ICD-10-CM | POA: Diagnosis not present

## 2023-12-04 DIAGNOSIS — M51362 Other intervertebral disc degeneration, lumbar region with discogenic back pain and lower extremity pain: Secondary | ICD-10-CM | POA: Diagnosis not present

## 2023-12-04 DIAGNOSIS — R2689 Other abnormalities of gait and mobility: Secondary | ICD-10-CM | POA: Diagnosis not present

## 2023-12-04 NOTE — Patient Instructions (Signed)
 It was so nice to see you today. Thank you so much for coming in.    You have wear and tear in your back and neck. I think your walking and balance issues are coming from your neck. You have severe spinal stenosis at C5-C6.   I want you to see Dr. Penne Sharps to discuss possible treatment for this including surgery. I will discuss with him when we can get you seen and let you know by tomorrow.   Please do not hesitate to call if you have any questions or concerns. You can also message me in MyChart.   Glade Boys PA-C [phone]     The physicians and staff at [facility] are committed to providing excellent care. You may receive a survey asking for feedback about your experience at our office. We value you your feedback and appreciate you taking the time to to fill it out. The [facility] leadership team is also available to discuss your experience in person, feel free to contact us  [phone].

## 2023-12-04 NOTE — Telephone Encounter (Signed)
 Called and spoke with patients daughter regarding the results of both MRI and have sent in Referral to [facility] in [place]

## 2023-12-05 NOTE — Progress Notes (Signed)
 Referring Physician:  Diedra Lame, MD [id number] [address] [facility] [place],  [place] [address]  Primary Physician:  Diedra Lame, MD  History of Present Illness: 12/09/2023 Jack White is here today to follow-up from a recent clinical visit.  His complicated history including hypertension, gastric ulcers, diabetes, stage III CKD, hyperlipidemia, subdural hematoma, CVA, MI.  He also previously carried a diagnosis of Parkinson's disease, but has since had that diagnosis lifted.  He has previously had difficulty with ambulation and has had a workup for parkinsonian is him as well as severe peripheral neuropathy.  He unfortunately suffered a stroke and subdural hematoma and underwent a craniotomy approximately 5 months ago.  He feels like his ambulation has gotten progressively worse since that time.  His symptoms continue to fluctuate, he has good and bad days where some days he is able to ambulate from his bed to his couch and around his trailer and some days he requires near full assistance for ambulation and getting around.  Today he is having a bad day and having significant difficulty with ambulating.  Feels like he has most difficulty in his left leg.  He states that he has worsened shaking in his legs and sometimes his arms.  He does feel like the left side of his body is worse on the right and this is gotten worse since his stroke and subdural hematoma.  He has very minimal pain overall and denies longstanding neck pain or chronic neck pain nor does he have chronic low back pain.  He states this is very minor and his overall complaints.  He has had some episodes of nighttime incontinence but no bowel incontinence.  Tobacco use: Does not smoke.   Bowel/Bladder Dysfunction: 2 month history of urinary incontinence at night, no bowel issues. No perineal numbness.   Conservative measures:  Physical therapy: he's been in HHPT Multimodal  medical therapy including regular antiinflammatories:  Gabapentin , Tramadol  Injections: no epidural steroid injections  Past Surgery: no spine surgery  The symptoms are causing a significant impact on the patient's life.   Review of Systems:  A 10 point review of systems is negative, except for the pertinent positives and negatives detailed in the HPI.  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 Lucien   COLOSTOMY TAKEDOWN N/A 10/26/2014   Procedure: COLOSTOMY TAKEDOWN;  Surgeon: Elgin Laurence III, MD;  Location: [facility] ORS;  Service: General;  Laterality: N/A;   CRANIOTOMY Right 07/21/2023   Procedure: CRANIOTOMY HEMATOMA EVACUATION SUBDURAL;  Surgeon: Lanis Pupa, MD;  Location: [facility] OR;  Service: Neurosurgery;  Laterality: Right;   EXPLORATORY LAPAROTOMY  05/28/2014   Dr. Lucien   LAPAROSCOPIC CHOLECYSTECTOMY  05/21/2014   Dr. Laurence    Allergies: Allergies as of 12/09/2023 - Review Complete 12/09/2023  Allergen Reaction Noted   Dilaudid [hydromorphone hcl] Other (  See Comments) 06/24/2014    Medications: Outpatient Encounter Medications as of 12/09/2023  Medication Sig   acetaminophen  (TYLENOL ) 500 MG tablet Take 500-1,000 mg by mouth 2 (two) times daily.   atorvastatin  (LIPITOR) 10 MG tablet Take 1 tablet (10 mg total) by mouth at bedtime.   butalbital -aspirin -caffeine  (FIORINAL) 50-325-40 MG capsule  TAKE 1 CAPSULE BY MOUTH EVERY 6 HOURS AS NEEDED FOR HEADACHE   carbamazepine  (TEGRETOL ) 200 MG tablet Take 1 tablet (200 mg total) by mouth 3 (three) times daily.   carbidopa -levodopa  (SINEMET  IR) 25-100 MG tablet Take 1 tablet by mouth 3 (three) times daily.   diphenhydrAMINE  (BENADRYL ) 25 MG tablet Take 2 tablets (50 mg total) by mouth at bedtime.   doxazosin  (CARDURA ) 4 MG tablet Take 4 mg by mouth daily.   DULoxetine  (CYMBALTA ) 60 MG capsule Take 60 mg by mouth daily.   ergocalciferol  (VITAMIN D2) 1.25 MG (50000 UT) capsule Take 50,000 Units by mouth once a week.   ferrous sulfate  325 (65 FE) MG tablet Take 1 tablet (325 mg total) by mouth daily with breakfast.   fluticasone  (FLONASE ) 50 MCG/ACT nasal spray Place 2 sprays into both nostrils daily as needed for allergies.   folic acid  (FOLVITE ) 1 MG tablet Take 1 tablet (1 mg total) by mouth daily.   gabapentin  (NEURONTIN ) 300 MG capsule Take 300 mg by mouth at bedtime.   guaiFENesin -dextromethorphan (ROBITUSSIN DM) 100-10 MG/5ML syrup Take 5 mLs by mouth every 4 (four) hours as needed for cough.   hydrocortisone  cream 1 % Apply 1 Application topically 3 (three) times daily as needed for itching.   hydroxypropyl methylcellulose / hypromellose (ISOPTO TEARS / GONIOVISC) 2.5 % ophthalmic solution Place 2 drops into both eyes 4 (four) times daily as needed for dry eyes.   ipratropium-albuterol  (DUONEB) 0.5-2.5 (3) MG/3ML SOLN Take 3 mLs by nebulization every 6 (six) hours as needed (wheezing).   melatonin 10 MG TABS Take 10 mg by mouth at bedtime.   metFORMIN  (GLUCOPHAGE -XR) 500 MG 24 hr tablet Take 500 mg by mouth daily.   modafinil  (PROVIGIL ) 100 MG tablet Take 1 tablet (100 mg total) by mouth daily.   montelukast  (SINGULAIR ) 10 MG tablet Take 10 mg by mouth at bedtime.   omeprazole (PRILOSEC) 20 MG capsule Take 20 mg by mouth daily.   polyethylene glycol (MIRALAX  / GLYCOLAX ) 17 g packet Take 17 g by mouth daily as needed.   propranolol  (INDERAL) 20 MG tablet Take 20 mg by mouth 2 (two) times daily.   sennosides (SENOKOT) 8.8 MG/5ML syrup Take 5 mLs by mouth at bedtime.   tamsulosin  (FLOMAX ) 0.4 MG CAPS capsule Take 0.4 mg by mouth daily after breakfast.   No facility-administered encounter medications on file as of 12/09/2023.    Social History: Social History   Tobacco Use   Smoking status: Never   Smokeless tobacco: Never  Vaping Use   Vaping status: Never Used  Substance Use Topics   Alcohol  use: No    Alcohol /week: 0.0 standard drinks of alcohol    Drug use: No    Family Medical History: Family History  Problem Relation Age of Onset   Diabetes Mother    Hypertension Mother    Alzheimer's disease Mother    Heart disease Father    Diabetes Sister     Physical Examination: Vitals:   12/09/23 1236  BP: 126/64     General: Patient is well developed, well nourished, calm, collected, and in no apparent distress. Attention to examination is  appropriate.  Respiratory: Patient is breathing without any difficulty.   NEUROLOGICAL:     Awake, alert, oriented to person, place, and time.  Speech is clear and fluent. Fund of knowledge is appropriate.  He has some difficulty with maintaining his upright head posture.  Cranial Nerves: Pupils equal round and reactive to light.  Facial tone is symmetric.    No cervical or lumbar tenderness.   No abnormal lesions on exposed skin.   Strength: Side Biceps Triceps Deltoid Interossei Grip Wrist Ext. Wrist Flex.  R 5 5 5 5 5 5 5   L 5 5 5 5 5 5 5    Side Iliopsoas Quads Hamstring PF DF EHL  R 5 5 5 5 5 5   L 4- 5 5 5 3 3    Reflexes are 1-2+ and symmetric at the biceps, brachioradialis, patella and achilles.   Hoffman's is absent, Tromner absent, no evidence of clonus  Bilateral upper and lower extremity sensation is intact to light touch.     No pain with IR/ER of both hips.   He is in a WC. Gait not tested.    He does appear to have increased tone and  also some evidence of spasticity noted on the left side predominantly.  This was noted with forced elbow extension and elbow flexion as well as wrist flexion and extension on the left.  This was not present on the right.  Left lower extremity also presented similar signs in the ankle and knee   Medical Decision Making  Imaging: Cervical MRI dated 12/02/23:  FINDINGS: The craniocervical junction is normal.   There is no significant bone marrow signal abnormality.   The cervical spinal cord is normal.   C2-C3: There is ankylosis of the vertebral bodies. There is ankylosis of the right facet joint. No spinal stenosis or foraminal stenosis   C3-C4: There is mild degenerative disc disease. There is severe bilateral facet arthropathy. There is a disc bulge with effacement of the thecal sac without compression of the cord, moderate spinal stenosis. There is moderate bilateral neural foraminal stenosis   C4-C5: There is moderate degenerative disc disease. There is a mild disc bulge with partial effacement of the thecal sac, mild spinal stenosis. There is severe bilateral facet arthropathy with moderate bilateral neural foraminal stenosis   C5-C6: There is moderate degenerative disc disease with a disc bulge and retrolisthesis. There is mild bilateral facet arthropathy. There is effacement of the thecal sac with slight indention of the cord, severe spinal stenosis. There are prominent foraminal spurs with severe bilateral neural foraminal stenosis   C6-C7: There is severe degenerative disc disease with mild disc bulge with partial effacement of the thecal sac, mild spinal stenosis. There is a severe left neural foraminal stenosis due to a prominent foraminal spur. Mild facet arthropathy on the right.   C7-T1: There is mild degenerative disc disease and facet arthropathy. No spinal stenosis or foraminal stenosis   IMPRESSION: Cervical spondylosis   1. There is ankylosis of the C2  and C3 vertebral bodies and right C2-C3 facet 2. There is severe bilateral facet arthropathy at C3-4 with a disc bulge causing moderate spinal stenosis. There is moderate bilateral neural foraminal stenosis 3. There is moderate degenerative disc disease at C4-5 with severe bilateral facet arthropathy and moderate bilateral neural foraminal stenosis 4. There is moderate degenerative disc disease at C 5 6 with a disc bulge and retrolisthesis. There is severe spinal stenosis with slight indention of the cord. There is  severe bilateral neural foraminal stenosis due to prominent neural foraminal spurs 5. Severe degenerative disc disease at C6-7 with severe left neural foraminal stenosis due to a prominent foraminal spur.     Electronically Signed   By: Nancyann Burns M.D.   On: 12/03/2023 09:35   Lumbar MRI dated 12/02/23:  FINDINGS: Bone marrow: No significant abnormality   Conus and cauda equina: No significant abnormality   Paraspinal tissues: No significant abnormality   L1-L2: There is moderate degenerative disc disease with a disc bulge. There is moderate facet arthropathy with facet enlargement and mild spinal stenosis. There is a moderate stenosis of the left neural foramen   L2-L3: There is moderate degenerative disc disease with a disc bulge. There is moderate facet arthropathy with facet enlargement and moderate spinal stenosis. There is mild bilateral neural foraminal stenosis   L3-L4: There is moderate degenerative disc disease. Mild facet arthropathy. There is no significant spinal stenosis. There is moderate bilateral neural foraminal stenosis due to lateral bulging of the disc and facet arthropathy   L4-L5: The disc is normal. Mild facet arthropathy. 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 There is no spinal stenosis. There is severe bilateral neural foraminal  stenosis   IMPRESSION: 1. 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 The degenerative disc disease has progressed compared with the prior study from 2013. The spondylolisthesis is unchanged. 2. Moderate spinal stenosis at L2-3 due to disc bulge and facet arthropathy. This is slightly worse compared with the prior study     Electronically Signed   By: Nancyann Burns M.D.   On: 12/03/2023 09:40  I have personally reviewed the images and agree with the above interpretation.  Assessment and Plan: 83 year old man with a complicated past medical history.  Over the past year to 2 years he has had evaluations for possible parkinsonian is him versus myoclonus versus other movement disorders.  Unfortunately approximately 5 months ago he had a severe fall, CVA, and craniotomy for subdural hematoma evacuation.  Although he was having some difficulty with walking before his ambulatory issues have continued to progress.  Interestingly, he does have both good and bad days where some days he is much brighter and can get around much easier although still not at a normal level, and some days he is so profoundly impacted that he is unable to ambulate without the use of a wheelchair and near full assist.  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  There was no clear T2 signal abnormality at these levels.  His physical examination does not appear to be overtly myelopathic.  He does not have any evidence of hyperreflexia at the patella or Achilles.  He has no clonus.  No crossed adductor sign.  He does not have any Tromner or Hoffmann reflexes noted in his upper extremities.  This could be blunted by his known polyneuropathy.    Since he does have a history of possible stroke and subdural  hematoma evacuation on the right with increased spasticity and tone noted in his left hemibody with progressive ambulatory difficulties I wonder if that this is a contributing factor.  He has not had an MRI since his subdural hematoma evacuation.  I will reach out to our  neurology team aids to see whether or not they think this would be helpful in understanding his progressive decline since recovering from his craniotomy.    I do have some reservations in performing a cervical decompression in his case as there is no clear myelomalacia, his exam does not appear to be overtly myelopathic, and in general it is rare to see patients have major fluctuations in their walking ability in the setting of progressive cervical spondylitic myelopathy.  Also patients that carry some parkinsonian traits often have a higher risk of poor outcomes with spinal surgery.  I will however like to get flexion-extension x-rays of his cervical spine to see whether or not that 5 6 level has excess mobility which could make the MRI appear less severe in certain positions.  I will plan to reach out to Dr. Evonnie to coordinate his care, would like to see whether or not a MRI of the brain would help with prognosticating or understanding his progressive decline over the last 5 months.  Penne MICAEL Sharps, MD Dept. of Neurosurgery

## 2023-12-05 NOTE — Telephone Encounter (Signed)
 Please call his daughter Reena Cork and see if they can come in to see Dr. Claudene at 11 on 12/09/23 (okay per Claudene).   For now, schedule as an in office visit, but let her know I will check with him on Monday to see if it can be phone or Mychart visit and we will let her know either way.   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

## 2023-12-09 ENCOUNTER — Encounter: Payer: Self-pay | Admitting: Neurosurgery

## 2023-12-09 ENCOUNTER — Ambulatory Visit (INDEPENDENT_AMBULATORY_CARE_PROVIDER_SITE_OTHER)

## 2023-12-09 ENCOUNTER — Ambulatory Visit (INDEPENDENT_AMBULATORY_CARE_PROVIDER_SITE_OTHER): Admitting: Neurosurgery

## 2023-12-09 VITALS — BP 126/64 | Ht 75.0 in | Wt 198.0 lb

## 2023-12-09 DIAGNOSIS — M50822 Other cervical disc disorders at C5-C6 level: Secondary | ICD-10-CM | POA: Diagnosis not present

## 2023-12-09 DIAGNOSIS — M47812 Spondylosis without myelopathy or radiculopathy, cervical region: Secondary | ICD-10-CM

## 2023-12-09 DIAGNOSIS — M40209 Unspecified kyphosis, site unspecified: Secondary | ICD-10-CM

## 2023-12-09 DIAGNOSIS — M4802 Spinal stenosis, cervical region: Secondary | ICD-10-CM

## 2023-12-11 DIAGNOSIS — R3915 Urgency of urination: Secondary | ICD-10-CM | POA: Diagnosis not present

## 2023-12-11 DIAGNOSIS — R8290 Unspecified abnormal findings in urine: Secondary | ICD-10-CM | POA: Diagnosis not present

## 2023-12-11 DIAGNOSIS — N390 Urinary tract infection, site not specified: Secondary | ICD-10-CM | POA: Diagnosis not present

## 2023-12-14 ENCOUNTER — Ambulatory Visit: Payer: Self-pay | Admitting: Neurosurgery

## 2023-12-15 ENCOUNTER — Telehealth: Payer: Self-pay | Admitting: Neurology

## 2023-12-15 NOTE — Telephone Encounter (Signed)
 Pt.s daughter asking to speak with [facility] no other details

## 2023-12-17 NOTE — Telephone Encounter (Signed)
 Called and spoke to [place] patients daughter. The have been to see Dr. Claudene at [facility] and Dr. Claudene reported patient not having a good day that day. He doesn't feel patient needs surgery and is no immediate danger. Dr. Claudene did mention that  it is strange the patient has good and bd days. Carbidopa  levodopa  has no effect daughter feels. Saturday patient got out of bed and in his wheelchair alone. The caregivers had run out for a few min and when they got back patient was in his wheelchair. Patient has days he forgets to take his meds but some days he does really well and takes all his meds. Patients daughter wanted to know is it a good idea to do a [place] MRI is it worth it after all the radiation he has had recently. He is having 50/50 good and bad days. Could it be stroke dementia? Dr. Claudene mentioned he thinks it is another type of movement disorder.

## 2023-12-24 ENCOUNTER — Other Ambulatory Visit: Payer: Self-pay | Admitting: Neurosurgery

## 2023-12-24 DIAGNOSIS — I69154 Hemiplegia and hemiparesis following nontraumatic intracerebral hemorrhage affecting left non-dominant side: Secondary | ICD-10-CM

## 2023-12-24 DIAGNOSIS — S065XAA Traumatic subdural hemorrhage with loss of consciousness status unknown, initial encounter: Secondary | ICD-10-CM

## 2023-12-24 NOTE — Telephone Encounter (Signed)
 Called Reena and left message to call office back

## 2023-12-24 NOTE — Telephone Encounter (Signed)
 Spoke to [place] regarding Dr. Martie recommendations. I also reached out to Dr. Claudene Killian to ask about the MRI as it has not been ordered yet and patients daughter is asking questions about the MRI and the movement D/O and if it brain related. Patients daughter getting frustrated she feels she is not getting any answers and her dad is getting worse

## 2023-12-25 NOTE — Telephone Encounter (Signed)
 Dr. Claudene to order MRI and to send any results

## 2023-12-29 ENCOUNTER — Ambulatory Visit
Admission: RE | Admit: 2023-12-29 | Discharge: 2023-12-29 | Disposition: A | Discharge status: Home / Self Care | Source: Ambulatory Visit | Attending: Neurosurgery | Admitting: Neurosurgery

## 2023-12-29 DIAGNOSIS — S065XAA Traumatic subdural hemorrhage with loss of consciousness status unknown, initial encounter: Secondary | ICD-10-CM | POA: Insufficient documentation

## 2023-12-29 DIAGNOSIS — I6200 Nontraumatic subdural hemorrhage, unspecified: Secondary | ICD-10-CM | POA: Diagnosis not present

## 2023-12-29 DIAGNOSIS — I69154 Hemiplegia and hemiparesis following nontraumatic intracerebral hemorrhage affecting left non-dominant side: Secondary | ICD-10-CM | POA: Diagnosis not present

## 2024-01-12 ENCOUNTER — Telehealth: Payer: Self-pay | Admitting: Neurology

## 2024-01-12 ENCOUNTER — Ambulatory Visit: Admitting: Neurology

## 2024-01-12 NOTE — Telephone Encounter (Signed)
 Patient and daughter thought they had an appointment today, so they had come into the office.  That appointment had been canceled in August.  We had actually seen the patient in September as well.  Front staff stated that they wanted to go over the MRI brain with me.  That was not something that I ordered and they will need to review that with ordering physician.  I reviewed the patient's chart.  Please call the patient and let them know that part.  Also let them know that when I saw him in September I did not see evidence of Parkinson's disease, which we discussed previously.  I did think that cervical stenosis was contributing to his ability to ambulate, but I know that there were concerns about any surgical intervention, which I completely understand.  Unfortunately, I am not sure from my standpoint what more there is to do or offer.  He did not improve on levodopa .  He has already seen [facility] neurology (who started the levodopa ) but unfortunately that didn't really help him.  We could get back to [facility] neurology for another opinion if they would like?

## 2024-01-12 NOTE — Telephone Encounter (Signed)
 Pt.s daughter came in for apopt cncld in August, Dr.Smith is saying he sent results to Dr. Evonnie and Daughter needs to get assistance on Pts Medication management and sched next steps and results

## 2024-01-13 ENCOUNTER — Ambulatory Visit: Admitting: Neurosurgery

## 2024-01-13 DIAGNOSIS — M4802 Spinal stenosis, cervical region: Secondary | ICD-10-CM

## 2024-01-13 DIAGNOSIS — M47812 Spondylosis without myelopathy or radiculopathy, cervical region: Secondary | ICD-10-CM | POA: Diagnosis not present

## 2024-01-13 DIAGNOSIS — S065XAD Traumatic subdural hemorrhage with loss of consciousness status unknown, subsequent encounter: Secondary | ICD-10-CM | POA: Diagnosis not present

## 2024-01-13 DIAGNOSIS — S065XAA Traumatic subdural hemorrhage with loss of consciousness status unknown, initial encounter: Secondary | ICD-10-CM

## 2024-01-13 DIAGNOSIS — I69154 Hemiplegia and hemiparesis following nontraumatic intracerebral hemorrhage affecting left non-dominant side: Secondary | ICD-10-CM

## 2024-01-13 NOTE — Progress Notes (Signed)
 I had a follow-up phone visit with Mr. Kandel daughter Reena.  We scheduled this visit to discuss his MRI brain.  She was at home and I was in the office.  She gave consent to go forward with a phone visit.  She states that her dad has been having more of his good days and bad days recently.  She states that he has had his longer streak of good days which was approximately 5 very recently.  He does have continued to have days where he is very tired or sleepy and very on interactive or seems to be more confused than he is normally.  She is inquiring today whether or not we find anything on his brain MRI that might be explaining his symptomatology.  Main reason for ordering the MRI was to see whether or not there is any major compression or ischemic insult causing a increased level of tone or left-sided hemibody weakness.  His MRI did show multiple scattered areas of infarct.  This appeared sleepy consistent with microvascular disease.  This does appear to have progression from 2023 and also includes some more ischemic insults in his deep right sided gray nuclei as well as in his right sided cerebellum predominantly.  He does have a continued fluid collection however does not appear to be overtly compressive.  No major posttraumati encephalomalacia was noted.  Full report is below.  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  He does have secondhand smoke exposure which was present for at least 25 to 30 years.  I did let her know that he does continue to have C5-6 cervical stenosis without any overt myelomalacia.  His physical examination is not consistent with myelopathy.  She was very concerned about the idea of going forward with a surgical intervention on his cervical spine as even when he was in better health a  few years ago he had multiple coding events after a surgical procedure.  She was also worried that he had a coding event during the recent subdural hematoma evacuation.  We did talk about traumatic brain injury and its effect on people's cognition, sensorium, emotionality, memory.  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  At this point she continues to not want to go forward with any cervical spinal surgery which we agree with.  Will continue to follow if necessary.  Her questions were answered to her approval.  We spent a total of 15 minutes on the phone call today.  Penne MICAEL Sharps, MD.

## 2024-01-13 NOTE — Telephone Encounter (Signed)
 Called pt and talked to daughter she did not understand why appointment was canceled yesterday. I ready the note per Dr. Evonnie and daughter continue to fill me in on all that has happen to her father from going to [facility]  and then to Dr. Claudene. She was mostly concern that Dr. Claudene reported that he thought that her father has a from of rare parkinson. I told her Dr. Evonnie seen his notes and messaged him. I reported to the daughter per Dr. Evonnie that he do not have parkinson due to the Datscan  and the Levodopa  test. Daughter understood. I also informed her that Dr Vernon office with need to give report on the MRI that he  order and that Dr. Evonnie did see it. She understood. She still would like to keep the March appointment even though Dr. Evonnie can not offer anything else with him. She still would like to get her father into PT later if Dr. Evonnie can order that after thanksgiving.

## 2024-01-20 DIAGNOSIS — Z87440 Personal history of urinary (tract) infections: Secondary | ICD-10-CM | POA: Diagnosis not present

## 2024-03-02 ENCOUNTER — Inpatient Hospital Stay
Admission: EM | Admit: 2024-03-02 | Discharge: 2024-03-17 | DRG: 322 | Disposition: A | Discharge status: Skilled Nursing Facility

## 2024-03-02 ENCOUNTER — Emergency Department

## 2024-03-02 DIAGNOSIS — M1990 Unspecified osteoarthritis, unspecified site: Secondary | ICD-10-CM | POA: Diagnosis present

## 2024-03-02 DIAGNOSIS — I25110 Atherosclerotic heart disease of native coronary artery with unstable angina pectoris: Principal | ICD-10-CM | POA: Diagnosis present

## 2024-03-02 DIAGNOSIS — Z7982 Long term (current) use of aspirin: Secondary | ICD-10-CM

## 2024-03-02 DIAGNOSIS — Z833 Family history of diabetes mellitus: Secondary | ICD-10-CM

## 2024-03-02 DIAGNOSIS — E440 Moderate protein-calorie malnutrition: Secondary | ICD-10-CM | POA: Diagnosis present

## 2024-03-02 DIAGNOSIS — Z7902 Long term (current) use of antithrombotics/antiplatelets: Secondary | ICD-10-CM

## 2024-03-02 DIAGNOSIS — Z59868 Other specified financial insecurity: Secondary | ICD-10-CM

## 2024-03-02 DIAGNOSIS — Z7984 Long term (current) use of oral hypoglycemic drugs: Secondary | ICD-10-CM

## 2024-03-02 DIAGNOSIS — N401 Enlarged prostate with lower urinary tract symptoms: Secondary | ICD-10-CM | POA: Diagnosis present

## 2024-03-02 DIAGNOSIS — D509 Iron deficiency anemia, unspecified: Secondary | ICD-10-CM | POA: Diagnosis present

## 2024-03-02 DIAGNOSIS — N400 Enlarged prostate without lower urinary tract symptoms: Secondary | ICD-10-CM | POA: Diagnosis present

## 2024-03-02 DIAGNOSIS — I252 Old myocardial infarction: Secondary | ICD-10-CM

## 2024-03-02 DIAGNOSIS — R072 Precordial pain: Secondary | ICD-10-CM

## 2024-03-02 DIAGNOSIS — I10 Essential (primary) hypertension: Secondary | ICD-10-CM | POA: Diagnosis present

## 2024-03-02 DIAGNOSIS — I959 Hypotension, unspecified: Secondary | ICD-10-CM | POA: Diagnosis not present

## 2024-03-02 DIAGNOSIS — I200 Unstable angina: Secondary | ICD-10-CM | POA: Insufficient documentation

## 2024-03-02 DIAGNOSIS — E1165 Type 2 diabetes mellitus with hyperglycemia: Secondary | ICD-10-CM | POA: Diagnosis present

## 2024-03-02 DIAGNOSIS — R338 Other retention of urine: Secondary | ICD-10-CM | POA: Diagnosis present

## 2024-03-02 DIAGNOSIS — N1831 Chronic kidney disease, stage 3a: Secondary | ICD-10-CM | POA: Diagnosis present

## 2024-03-02 DIAGNOSIS — R079 Chest pain, unspecified: Principal | ICD-10-CM | POA: Diagnosis present

## 2024-03-02 DIAGNOSIS — E785 Hyperlipidemia, unspecified: Secondary | ICD-10-CM | POA: Diagnosis present

## 2024-03-02 DIAGNOSIS — D62 Acute posthemorrhagic anemia: Secondary | ICD-10-CM | POA: Diagnosis not present

## 2024-03-02 DIAGNOSIS — E119 Type 2 diabetes mellitus without complications: Secondary | ICD-10-CM

## 2024-03-02 DIAGNOSIS — Z85828 Personal history of other malignant neoplasm of skin: Secondary | ICD-10-CM

## 2024-03-02 DIAGNOSIS — E871 Hypo-osmolality and hyponatremia: Secondary | ICD-10-CM | POA: Diagnosis not present

## 2024-03-02 DIAGNOSIS — I129 Hypertensive chronic kidney disease with stage 1 through stage 4 chronic kidney disease, or unspecified chronic kidney disease: Secondary | ICD-10-CM | POA: Diagnosis present

## 2024-03-02 DIAGNOSIS — Z66 Do not resuscitate: Secondary | ICD-10-CM | POA: Diagnosis not present

## 2024-03-02 DIAGNOSIS — Z8249 Family history of ischemic heart disease and other diseases of the circulatory system: Secondary | ICD-10-CM

## 2024-03-02 DIAGNOSIS — E1122 Type 2 diabetes mellitus with diabetic chronic kidney disease: Secondary | ICD-10-CM | POA: Diagnosis present

## 2024-03-02 DIAGNOSIS — Z9049 Acquired absence of other specified parts of digestive tract: Secondary | ICD-10-CM

## 2024-03-02 DIAGNOSIS — Z9889 Other specified postprocedural states: Secondary | ICD-10-CM

## 2024-03-02 DIAGNOSIS — R7989 Other specified abnormal findings of blood chemistry: Secondary | ICD-10-CM

## 2024-03-02 DIAGNOSIS — L89152 Pressure ulcer of sacral region, stage 2: Secondary | ICD-10-CM | POA: Diagnosis not present

## 2024-03-02 DIAGNOSIS — G20A1 Parkinson's disease without dyskinesia, without mention of fluctuations: Secondary | ICD-10-CM | POA: Diagnosis present

## 2024-03-02 DIAGNOSIS — Z820 Family history of epilepsy and other diseases of the nervous system: Secondary | ICD-10-CM

## 2024-03-02 DIAGNOSIS — Z23 Encounter for immunization: Secondary | ICD-10-CM

## 2024-03-02 DIAGNOSIS — I259 Chronic ischemic heart disease, unspecified: Secondary | ICD-10-CM

## 2024-03-02 DIAGNOSIS — Z8711 Personal history of peptic ulcer disease: Secondary | ICD-10-CM

## 2024-03-02 DIAGNOSIS — E875 Hyperkalemia: Secondary | ICD-10-CM | POA: Diagnosis present

## 2024-03-02 DIAGNOSIS — Z79899 Other long term (current) drug therapy: Secondary | ICD-10-CM

## 2024-03-02 DIAGNOSIS — G500 Trigeminal neuralgia: Secondary | ICD-10-CM | POA: Diagnosis present

## 2024-03-02 DIAGNOSIS — Z6825 Body mass index (BMI) 25.0-25.9, adult: Secondary | ICD-10-CM

## 2024-03-02 DIAGNOSIS — D696 Thrombocytopenia, unspecified: Secondary | ICD-10-CM | POA: Diagnosis present

## 2024-03-02 DIAGNOSIS — K5900 Constipation, unspecified: Secondary | ICD-10-CM | POA: Diagnosis not present

## 2024-03-02 DIAGNOSIS — N179 Acute kidney failure, unspecified: Secondary | ICD-10-CM | POA: Diagnosis not present

## 2024-03-02 LAB — BASIC METABOLIC PANEL WITH GFR
Anion gap: 9 (ref 5–15)
BUN: 25 mg/dL — ABNORMAL HIGH (ref 8–23)
CO2: 26 mmol/L (ref 22–32)
Calcium: 8.3 mg/dL — ABNORMAL LOW (ref 8.9–10.3)
Chloride: 105 mmol/L (ref 98–111)
Creatinine, Ser: 1.41 mg/dL — ABNORMAL HIGH (ref 0.61–1.24)
GFR, Estimated: 49 mL/min — ABNORMAL LOW
Glucose, Bld: 166 mg/dL — ABNORMAL HIGH (ref 70–99)
Potassium: 4.6 mmol/L (ref 3.5–5.1)
Sodium: 140 mmol/L (ref 135–145)

## 2024-03-02 LAB — CBC
HCT: 33.4 % — ABNORMAL LOW (ref 39.0–52.0)
Hemoglobin: 10.6 g/dL — ABNORMAL LOW (ref 13.0–17.0)
MCH: 31.2 pg (ref 26.0–34.0)
MCHC: 31.7 g/dL (ref 30.0–36.0)
MCV: 98.2 fL (ref 80.0–100.0)
Platelets: 158 K/uL (ref 150–400)
RBC: 3.4 MIL/uL — ABNORMAL LOW (ref 4.22–5.81)
RDW: 12.6 % (ref 11.5–15.5)
WBC: 5.1 K/uL (ref 4.0–10.5)
nRBC: 0 % (ref 0.0–0.2)

## 2024-03-02 LAB — TROPONIN T, HIGH SENSITIVITY
Troponin T High Sensitivity: 23 ng/L — ABNORMAL HIGH (ref 0–19)
Troponin T High Sensitivity: 24 ng/L — ABNORMAL HIGH (ref 0–19)

## 2024-03-02 LAB — LIPASE, BLOOD: Lipase: 43 U/L (ref 11–51)

## 2024-03-02 LAB — CBG MONITORING, ED: Glucose-Capillary: 156 mg/dL — ABNORMAL HIGH (ref 70–99)

## 2024-03-02 LAB — D-DIMER, QUANTITATIVE: D-Dimer, Quant: 0.83 ug{FEU}/mL — ABNORMAL HIGH (ref 0.00–0.50)

## 2024-03-02 MED ORDER — ASPIRIN 81 MG PO CHEW
324.0000 mg | CHEWABLE_TABLET | Freq: Once | ORAL | Status: AC
  Administered 2024-03-02: 324 mg via ORAL
  Filled 2024-03-02: qty 4

## 2024-03-02 MED ORDER — ACETAMINOPHEN 325 MG PO TABS
650.0000 mg | ORAL_TABLET | Freq: Once | ORAL | Status: AC | PRN
  Administered 2024-03-02: 650 mg via ORAL
  Filled 2024-03-02: qty 2

## 2024-03-02 NOTE — ED Provider Notes (Signed)
 "  [facility] Provider Note    Event Date/Time   First MD Initiated Contact with Patient 03/02/24 1837     (approximate)   History   Shortness of Breath   HPI  Jack White is a 84 y.o. male had a sudden episode of a feeling of tightness in his chest and vomited once and felt short of breath at about 1 PM today.  It is resolved now except for just a slight feeling of a light discomfort but he cannot describe it to well and it feels much better.  No abdominal pain.  Did vomit once but reports his stomach does not hurt at all.  Reviewed external records from Dr. Claudene on November 18 of last year.  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  No acute numbness or weakness.  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   Subdural hematoma (HCC) 2025     Physical Exam   Triage Vital Signs: ED Triage Vitals  Encounter Vitals Group     BP 03/02/24 1519 118/64     Girls Systolic BP Percentile --      Girls Diastolic BP Percentile --      Boys Systolic BP Percentile --      Boys Diastolic BP Percentile --      Pulse Rate 03/02/24 1519 62     Resp 03/02/24 1519 20     Temp 03/02/24 1519 98.9 F (37.2 C)     Temp Source 03/02/24 1519 Oral     SpO2 03/02/24 1519 98 %     Weight --      Height --      Head Circumference --      Peak Flow --      Pain Score 03/02/24 1807 8     Pain Loc --      Pain Education --      Exclude from Growth Chart --     Most recent  vital signs: Vitals:   03/02/24 2300 03/02/24 2330  BP: (!) 156/65 (!) 142/84  Pulse: 62 (!) 59  Resp: 16 13  Temp: 98.3 F (36.8 C)   SpO2: 100% 98%     General: Awake, no distress.  Pleasant daughter at bedside CV:   Good peripheral perfusion. Normal rate and heart tones. Resp:   Normal effort. Lung sounds clear bilateral. Speaking without distress. Abd:   No distention. Soft, non-tender to palpation in all quadrants. No rebound or guarding. Neuro:   No focal neuro deficits noted. Moves extremities well without noted concern. Other:  Very reassuring exam at this time.   ED Results / Procedures / Treatments   Labs (all labs ordered are listed, but only abnormal results are displayed) Labs Reviewed  CBC - Abnormal; Notable for the following components:      Result Value   RBC 3.40 (*)    Hemoglobin 10.6 (*)    HCT 33.4 (*)  All other components within normal limits  BASIC METABOLIC PANEL WITH GFR - Abnormal; Notable for the following components:   Glucose, Bld 166 (*)    BUN 25 (*)    Creatinine, Ser 1.41 (*)    Calcium  8.3 (*)    GFR, Estimated 49 (*)    All other components within normal limits  D-DIMER, QUANTITATIVE - Abnormal; Notable for the following components:   D-Dimer, Quant 0.83 (*)    All other components within normal limits  CBG MONITORING, ED - Abnormal; Notable for the following components:   Glucose-Capillary 156 (*)    All other components within normal limits  TROPONIN T, HIGH SENSITIVITY - Abnormal; Notable for the following components:   Troponin T High Sensitivity 23 (*)    All other components within normal limits  TROPONIN T, HIGH SENSITIVITY - Abnormal; Notable for the following components:   Troponin T High Sensitivity 24 (*)    All other components within normal limits  LIPASE, BLOOD   Troponin abnormal x 2.  Previous troponins were normal.  Not a significant rise but nonetheless abnormal and query if the patient could have had a mild  demand stressor, but was not rising to point I would highly suggest ACS though  EKG  I independently reviewed the EKG 1525 heart rate 60 QRS 100 QTc 420 Probable old anteroseptal infarct.  No evidence of frank new ischemia.  Slight artifact versus mild very minimal T wave abnormality lateral precordial   RADIOLOGY I independently reviewed images of chest x-ray, negative for acute   CT Head Wo Contrast Result Date: 03/02/2024 CLINICAL DATA:  Fall 2 weeks ago.  Episode of emesis today. EXAM: CT HEAD WITHOUT CONTRAST TECHNIQUE: Contiguous axial images were obtained from the base of the skull through the vertex without intravenous contrast. RADIATION DOSE REDUCTION: This exam was performed according to the departmental dose-optimization program which includes automated exposure control, adjustment of the mA and/or kV according to patient size and/or use of iterative reconstruction technique. COMPARISON:  09/19/2023, 07/21/2023 FINDINGS: Brain: Ventricles, cisterns and other CSF spaces are unchanged and within normal. There is minimal chronic ischemic microvascular disease present. No mass, mass effect, shift of midline structures or acute hemorrhage. Postsurgical/posttraumatic sequelae with thickening/calcification of the right frontal subdural region unchanged from 09/19/2023. This directly underlies the right frontal craniotomy defect. Vascular: No hyperdense vessel or unexpected calcification. Skull: Evidence of previous right frontal craniectomy. Sinuses/Orbits: Hypoplastic frontal sinuses as the paranasal sinuses are otherwise clear. Mastoid air cells are clear. Orbits are normal. Other: None. IMPRESSION: 1. No acute findings. 2. Minimal chronic ischemic microvascular disease. 3. Previous right frontal craniotomy with stable underlying postsurgical changes. Electronically Signed   By: Toribio Agreste M.D.   On: 03/02/2024 16:31   DG Chest 2 View Result Date: 03/02/2024 EXAM: 2 VIEW(S) XRAY OF THE CHEST  03/02/2024 03:51:00 PM COMPARISON: 08/31/2023 CLINICAL HISTORY: CP FINDINGS: LUNGS AND PLEURA: Low lung volumes. No focal pulmonary opacity. No pleural effusion. No pneumothorax. HEART AND MEDIASTINUM: Cardiomegaly. Tortuous thoracic aorta. Atherosclerotic calcifications. BONES AND SOFT TISSUES: Multilevel degenerative changes of thoracic spine. No acute osseous abnormality. IMPRESSION: 1. No acute findings. 2. Cardiomegaly. Electronically signed by: Greig Pique MD 03/02/2024 04:29 PM EST RP Workstation: [id number]   Patient and daughter report no history of blood clots.  No history of pulmonary embolism.  No recent major surgeries.  Low pretest probability for PE.  Will send D-dimer  PROCEDURES:  Critical Care performed: No  Procedures   MEDICATIONS  ORDERED IN ED: Medications  acetaminophen  (TYLENOL ) tablet 650 mg (650 mg Oral Given 03/02/24 1807)  aspirin  chewable tablet 324 mg (324 mg Oral Given 03/02/24 1914)     IMPRESSION / MDM / ASSESSMENT AND PLAN / ED COURSE  I reviewed the triage vital signs and the nursing notes.                              Based on presentation, the differential diagnosis includes, but is not limited to key considerations: chest pain DIFFERENTIAL DIAGNOSIS CONSIDERATIONS: High-acuity etiologies particularly considered for rule-out: - Acute Coronary Syndrome (STEMI / NSTEMI / Unstable Angina) - Pulmonary Embolism - Aortic Dissection (Thoracic) - Tension Pneumothorax - Esophageal Rupture (Boerhaave's) - Cardiac Tamponade - Unstable Arrhythmia  Supple history provided suggest potential esophageal type issue but he has since been able to drink water .  He was eating cornflakes not long before this happened.  His symptoms largely resolved however troponin is slightly abnormal given his age and symptoms other considerations to rule out would certainly be PE will use D-dimer.  Troponin abnormal x 2 but very minimally elevated.  Will give aspirin  continue to  monitor closely.  This list is not exhaustive.   Patient's presentation is most consistent with acute complicated illness / injury requiring diagnostic workup.    The patient is on the cardiac monitor to evaluate for evidence of arrhythmia and/or significant heart rate changes.  Clinical Course as of 03/02/24 2354  Tue Mar 02, 2024  2217 Resting comfortably.  Anticipate admission due to concerns of elevated troponin in the setting of episode of somewhat hard describe chest discomfort and dyspnea.  D-dimer remains pending, lab has been called and evidently is being added on/work now. [MQ]  2342 D-dimer elevated.  CT PE ordered.  Anticipate admission thereafter, Dr. Willo to follow-up [MQ]    Clinical Course User Index [MQ] Dicky Anes, MD     FINAL CLINICAL IMPRESSION(S) / ED DIAGNOSES   Final diagnoses:  Chest pain, moderate coronary artery risk  Troponin level elevated     Rx / DC Orders   ED Discharge Orders     None        Note:  This document was prepared using Dragon voice recognition software and may include unintentional dictation errors.   Dicky Anes, MD 03/02/24 2355  "

## 2024-03-02 NOTE — ED Provider Notes (Signed)
----------------------------------------- °  11:10 PM on 03/02/2024 -----------------------------------------  Blood pressure 131/82, pulse 63, temperature 98.7 F (37.1 C), temperature source Oral, resp. rate 18, SpO2 98%.  Assuming care from Dr. Dicky.  In short, Jack White is a 84 y.o. male with a chief complaint of chest pain.  Refer to the original H&P for additional details.  The current plan of care is to follow-up CTA chest and anticipate admission for high risk chest pain.  ----------------------------------------- 3:05 AM on 03/03/2024 ----------------------------------------- CTA chest is negative for PE or other acute finding, patient is asymptomatic on reassessment.  Case discussed with hospitalist for admission for further cardiac workup.   Medications  acetaminophen  (TYLENOL ) tablet 650 mg (650 mg Oral Given 03/02/24 1807)  aspirin  chewable tablet 324 mg (324 mg Oral Given 03/02/24 1914)  iohexol  (OMNIPAQUE ) 350 MG/ML injection 75 mL (75 mLs Intravenous Contrast Given 03/03/24 0017)     ED Discharge Orders     None      Final diagnoses:  Chest pain, moderate coronary artery risk  Troponin level elevated      Willo Dunnings, MD 03/03/24 [phone]

## 2024-03-02 NOTE — ED Triage Notes (Signed)
 Pt to ED via [facility] from home. Daughter reports pt fell 2 weeks again and was not seen. Pt with hx of hemorrhage. Daughter reports in today had an episode of emesis and then started to have SOB and difficulty swallowing. Daughter also states GI issues and has been taking medication with some relief.

## 2024-03-03 ENCOUNTER — Observation Stay

## 2024-03-03 ENCOUNTER — Emergency Department

## 2024-03-03 DIAGNOSIS — R072 Precordial pain: Secondary | ICD-10-CM

## 2024-03-03 DIAGNOSIS — R079 Chest pain, unspecified: Secondary | ICD-10-CM | POA: Diagnosis present

## 2024-03-03 DIAGNOSIS — Z9889 Other specified postprocedural states: Secondary | ICD-10-CM

## 2024-03-03 LAB — NM MYOCAR MULTI W/SPECT W/WALL MOTION / EF
Base ST Depression (mm): 0 mm
Estimated workload: 1
Exercise duration (min): 1 min
Exercise duration (sec): 0 s
LV dias vol: 81 mL (ref 62–150)
LV sys vol: 34 mL
MPHR: 135 {beats}/min
Nuc Stress EF: 58 %
Peak HR: 83 {beats}/min
Percent HR: 61 %
Rest HR: 55 {beats}/min
Rest Nuclear Isotope Dose: 9.5 mCi
SDS: 11
SRS: 9
SSS: 8
ST Depression (mm): 0 mm
Stress Nuclear Isotope Dose: 30.6 mCi
TID: 0.97

## 2024-03-03 LAB — URINALYSIS, ROUTINE W REFLEX MICROSCOPIC
Bilirubin Urine: NEGATIVE
Glucose, UA: NEGATIVE mg/dL
Hgb urine dipstick: NEGATIVE
Ketones, ur: NEGATIVE mg/dL
Nitrite: NEGATIVE
Protein, ur: NEGATIVE mg/dL
Specific Gravity, Urine: 1.025 (ref 1.005–1.030)
pH: 7 (ref 5.0–8.0)

## 2024-03-03 MED ORDER — DIPHENHYDRAMINE HCL 25 MG PO TABS
50.0000 mg | ORAL_TABLET | Freq: Every day | ORAL | Status: DC
  Filled 2024-03-03: qty 2

## 2024-03-03 MED ORDER — NITROGLYCERIN 0.4 MG SL SUBL: 0.4000 mg | SUBLINGUAL_TABLET | SUBLINGUAL | Status: DC | PRN

## 2024-03-03 MED ORDER — GABAPENTIN 300 MG PO CAPS
300.0000 mg | ORAL_CAPSULE | Freq: Every day | ORAL | Status: DC
  Administered 2024-03-03 – 2024-03-16 (×14): 300 mg via ORAL
  Filled 2024-03-03 (×14): qty 1

## 2024-03-03 MED ORDER — DIPHENHYDRAMINE HCL 25 MG PO CAPS
50.0000 mg | ORAL_CAPSULE | Freq: Every day | ORAL | Status: DC
  Administered 2024-03-03: 50 mg via ORAL
  Filled 2024-03-03: qty 2

## 2024-03-03 MED ORDER — MODAFINIL 100 MG PO TABS
100.0000 mg | ORAL_TABLET | Freq: Every day | ORAL | Status: DC
  Administered 2024-03-03 – 2024-03-17 (×15): 100 mg via ORAL
  Filled 2024-03-03 (×15): qty 1

## 2024-03-03 MED ORDER — ASPIRIN 81 MG PO TBEC
81.0000 mg | DELAYED_RELEASE_TABLET | Freq: Every day | ORAL | Status: DC
  Administered 2024-03-04: 81 mg via ORAL
  Filled 2024-03-03: qty 1

## 2024-03-03 MED ORDER — ATORVASTATIN CALCIUM 10 MG PO TABS
10.0000 mg | ORAL_TABLET | Freq: Every day | ORAL | Status: DC
  Administered 2024-03-03 – 2024-03-07 (×5): 10 mg via ORAL
  Filled 2024-03-03 (×5): qty 1

## 2024-03-03 MED ORDER — ENOXAPARIN SODIUM 40 MG/0.4ML IJ SOSY
40.0000 mg | PREFILLED_SYRINGE | INTRAMUSCULAR | Status: DC
  Administered 2024-03-03 – 2024-03-15 (×13): 40 mg via SUBCUTANEOUS
  Filled 2024-03-03 (×13): qty 0.4

## 2024-03-03 MED ORDER — MELATONIN 5 MG PO TABS
10.0000 mg | ORAL_TABLET | Freq: Every day | ORAL | Status: DC
  Administered 2024-03-03 – 2024-03-05 (×3): 10 mg via ORAL
  Filled 2024-03-03 (×3): qty 2

## 2024-03-03 MED ORDER — ENOXAPARIN SODIUM 40 MG/0.4ML IJ SOSY: 40.0000 mg | PREFILLED_SYRINGE | INTRAMUSCULAR | Status: DC

## 2024-03-03 MED ORDER — IPRATROPIUM-ALBUTEROL 0.5-2.5 (3) MG/3ML IN SOLN
3.0000 mL | Freq: Four times a day (QID) | RESPIRATORY_TRACT | Status: DC | PRN
  Administered 2024-03-09: 3 mL via RESPIRATORY_TRACT

## 2024-03-03 MED ORDER — REGADENOSON 0.4 MG/5ML IV SOLN
0.4000 mg | Freq: Once | INTRAVENOUS | Status: AC
  Administered 2024-03-03: 0.4 mg via INTRAVENOUS

## 2024-03-03 MED ORDER — INFLUENZA VAC SPLIT HIGH-DOSE 0.5 ML IM SUSY
0.5000 mL | PREFILLED_SYRINGE | INTRAMUSCULAR | Status: AC
  Administered 2024-03-04: 0.5 mL via INTRAMUSCULAR
  Filled 2024-03-03: qty 0.5

## 2024-03-03 MED ORDER — ONDANSETRON HCL 4 MG/2ML IJ SOLN
4.0000 mg | Freq: Four times a day (QID) | INTRAMUSCULAR | Status: DC | PRN
  Administered 2024-03-04 – 2024-03-05 (×2): 4 mg via INTRAVENOUS
  Filled 2024-03-03 (×2): qty 2

## 2024-03-03 MED ORDER — IOHEXOL 350 MG/ML SOLN
75.0000 mL | Freq: Once | INTRAVENOUS | Status: AC | PRN
  Administered 2024-03-03: 75 mL via INTRAVENOUS

## 2024-03-03 MED ORDER — PROPRANOLOL HCL 20 MG PO TABS
20.0000 mg | ORAL_TABLET | Freq: Two times a day (BID) | ORAL | Status: DC
  Administered 2024-03-03 – 2024-03-05 (×6): 20 mg via ORAL
  Filled 2024-03-03 (×6): qty 1

## 2024-03-03 MED ORDER — ACETAMINOPHEN 325 MG PO TABS
650.0000 mg | ORAL_TABLET | ORAL | Status: DC | PRN
  Administered 2024-03-05 – 2024-03-16 (×5): 650 mg via ORAL
  Filled 2024-03-03 (×5): qty 2

## 2024-03-03 MED ORDER — TECHNETIUM TC 99M TETROFOSMIN IV KIT
10.0000 | PACK | Freq: Once | INTRAVENOUS | Status: AC | PRN
  Administered 2024-03-03: 9.51 via INTRAVENOUS

## 2024-03-03 MED ORDER — TECHNETIUM TC 99M TETROFOSMIN IV KIT
30.0000 | PACK | Freq: Once | INTRAVENOUS | Status: AC | PRN
  Administered 2024-03-03: 30.6 via INTRAVENOUS

## 2024-03-03 MED ORDER — HEPARIN SODIUM (PORCINE) 5000 UNIT/ML IJ SOLN: 5000.0000 [IU] | Freq: Three times a day (TID) | INTRAMUSCULAR | Status: DC

## 2024-03-03 MED ORDER — TAMSULOSIN HCL 0.4 MG PO CAPS
0.4000 mg | ORAL_CAPSULE | Freq: Every day | ORAL | Status: DC
  Administered 2024-03-03 – 2024-03-17 (×15): 0.4 mg via ORAL
  Filled 2024-03-03 (×15): qty 1

## 2024-03-03 MED ORDER — CARBAMAZEPINE 200 MG PO TABS
200.0000 mg | ORAL_TABLET | Freq: Three times a day (TID) | ORAL | Status: DC
  Administered 2024-03-03 – 2024-03-17 (×43): 200 mg via ORAL
  Filled 2024-03-03 (×46): qty 1

## 2024-03-03 NOTE — Assessment & Plan Note (Deleted)
 Continue carbamazepine

## 2024-03-03 NOTE — Assessment & Plan Note (Addendum)
 Trigeminal neuralgia Continue gabapentin  and carbamazepine 

## 2024-03-03 NOTE — Progress Notes (Signed)
" °  Progress Note   Patient: Jack White FMW:[id number] DOB: February 26, 1940 DOA: 03/02/2024     0 DOS: the patient was seen and examined on 03/03/2024   Brief hospital course: Jack White is a 84 y.o. 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  Patient has elevated D-dimer, CT angiogram was negative for PE. Patient also had a stress nuclear stress test, was positive for ischemia.  Scheduled for heart cath tomorrow.   Principal Problem:   Precordial chest pain Active Problems:   Stage 3a chronic kidney disease (HCC)   Diabetes mellitus, type 2 (HCC)   Hypertension   Parkinson disease (HCC)   Trigeminal neuralgia   BPH (benign prostatic hyperplasia)   History of traumatic subdural hematoma s/p craniotomy   Chest pain   Assessment and Plan:  * Precordial chest pain Negative for PE, positive nuclear stress test.  Scheduled for heart cath tomorrow  History of traumatic subdural hematoma s/p craniotomy No acute issues  BPH (benign prostatic hyperplasia) Continue tamsulosin   Parkinson disease (HCC) Trigeminal neuralgia Continue gabapentin  and carbamazepine   Hypertension Continue home meds  Diabetes mellitus, type 2 (HCC) Sliding scale insulin  coverage  Stage 3a chronic kidney disease (HCC) At baseline      Subjective:  Patient doing well, no additional chest pain  Physical Exam: Vitals:   03/03/24 0400 03/03/24 0600 03/03/24 0759 03/03/24 1028  BP: (!) 156/66  (!) 158/64 (!) 167/52  Pulse: 69  62 (!) 56  Resp: 18  20   Temp: 97.8 F (36.6 C)  97.8 F (36.6 C)   TempSrc: Oral  Oral   SpO2: 100%  99%   Weight:  93.1 kg     General exam: Appears calm and comfortable  Respiratory system: Clear to auscultation. Respiratory effort normal. Cardiovascular system: S1 & S2 heard,  RRR. No JVD, murmurs, rubs, gallops or clicks. No pedal edema. Gastrointestinal system: Abdomen is nondistended, soft and nontender. No organomegaly or masses felt. Normal bowel sounds heard. Central nervous system: Alert and oriented. No focal neurological deficits. Extremities: Symmetric 5 x 5 power. Skin: No rashes, lesions or ulcers Psychiatry: Judgement and insight appear normal. Mood & affect appropriate.    Data Reviewed:  Reviewed CT scan results, lab results and stress test results  Family Communication: Daughter updated at bedside  Disposition: Status is: Observation      Time spent: no charge  minutes  Author: Murvin Mana, MD 03/03/2024 4:46 PM  For on call review [email or web address]  ' "

## 2024-03-03 NOTE — Hospital Course (Signed)
 Jack White

## 2024-03-03 NOTE — ED Notes (Signed)
 This EDT and Domenica EDT did brief and chux change. Pt had soiled brief. Pt was provided with clean gown and warm blankets

## 2024-03-03 NOTE — H&P (Signed)
 " History and Physical    Patient: Jack White FMW:[id number] DOB: 1940-09-12 DOA: 03/02/2024 DOS: the patient was seen and examined on 03/03/2024 PCP: Diedra Lame, MD  Patient coming from: Home  Chief Complaint:  Chief Complaint  Patient presents with   Shortness of Breath    HPI: Jack White is a 84 y.o. 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  Patient was in his usual state of health when he developed sudden onset chest pain and shortness of breath lasting several minutes, resolved by arrival. Vitals in the ED on arrival were unremarkable.  Troponin 23-24 and D-dimer 0.83.  Other labs at baseline with baseline hemoglobin of 10.6 and baseline creatinine of 1.41. EKG without any ischemic changes CTA chest Negative for PE head CT nonacute. Patient treated with chewable aspirin  Admission requested for high risk chest pain.     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 Lucien   COLOSTOMY TAKEDOWN N/A 10/26/2014   Procedure: COLOSTOMY TAKEDOWN;  Surgeon: Elgin Laurence III, MD;  Location: [facility] ORS;  Service: General;  Laterality: N/A;   CRANIOTOMY Right 07/21/2023   Procedure: CRANIOTOMY HEMATOMA EVACUATION SUBDURAL;  Surgeon: Lanis Pupa, MD;  Location: [facility] OR;   Service: Neurosurgery;  Laterality: Right;   EXPLORATORY LAPAROTOMY  05/28/2014   Dr. Lucien   LAPAROSCOPIC CHOLECYSTECTOMY  05/21/2014   Dr. Laurence   Social History:  reports that he has never smoked. He has never used smokeless tobacco. He reports that he does not drink alcohol  and does not use drugs.  Allergies[1]  Family History  Problem Relation Age of Onset   Diabetes Mother    Hypertension Mother    Alzheimer's disease Mother    Heart disease Father    Diabetes Sister     Prior to Admission medications  Medication Sig Start Date End Date Taking? Authorizing Provider  acetaminophen  (TYLENOL ) 500 MG tablet Take 500-1,000 mg by mouth 2 (two) times daily.   Yes [provider]  atorvastatin  (LIPITOR) 10 MG tablet Take 1 tablet (10 mg total) by mouth at bedtime. 08/13/23  Yes Patsy Lenis, MD  butalbital -aspirin -caffeine  (FIORINAL) 50-325-40 MG capsule TAKE 1 CAPSULE BY MOUTH EVERY 6 HOURS AS NEEDED FOR HEADACHE 10/20/23  Yes [provider]  carbamazepine  (TEGRETOL ) 200 MG tablet Take 1 tablet (200 mg total) by mouth 3 (three) times daily. 08/13/23  Yes Patsy Lenis, MD  diphenhydrAMINE  (BENADRYL ) 25 MG tablet Take 2 tablets (50 mg total) by mouth at bedtime. 08/13/23  Yes Patsy Lenis, MD  doxazosin  (CARDURA ) 4 MG tablet Take 4 mg by mouth daily. 10/17/14  Yes [provider]  DULoxetine  (CYMBALTA ) 60 MG capsule Take 60 mg by mouth daily.   Yes [provider]  ergocalciferol  (VITAMIN D2) 1.25 MG (  50000 UT) capsule Take 50,000 Units by mouth once a week.   Yes [provider]  ferrous sulfate  325 (65 FE) MG tablet Take 1 tablet (325 mg total) by mouth daily with breakfast. 08/14/23  Yes Patsy Lenis, MD  fluticasone  (FLONASE ) 50 MCG/ACT nasal spray Place 2 sprays into both nostrils daily as needed for allergies. 12/10/22 03/02/24 Yes [provider]  folic acid  (FOLVITE ) 1 MG tablet Take 1 tablet (1 mg total) by mouth daily.  08/14/23  Yes Patsy Lenis, MD  gabapentin  (NEURONTIN ) 300 MG capsule Take 300 mg by mouth at bedtime.   Yes [provider]  guaiFENesin -dextromethorphan (ROBITUSSIN DM) 100-10 MG/5ML syrup Take 5 mLs by mouth every 4 (four) hours as needed for cough. 09/01/23  Yes Caleen Qualia, MD  hydrocortisone  cream 1 % Apply 1 Application topically 3 (three) times daily as needed for itching. 08/13/23  Yes Patsy Lenis, MD  hydroxypropyl methylcellulose / hypromellose (ISOPTO TEARS / GONIOVISC) 2.5 % ophthalmic solution Place 2 drops into both eyes 4 (four) times daily as needed for dry eyes. 08/13/23  Yes Patsy Lenis, MD  ipratropium-albuterol  (DUONEB) 0.5-2.5 (3) MG/3ML SOLN Take 3 mLs by nebulization every 6 (six) hours as needed (wheezing).   Yes [provider]  melatonin 10 MG TABS Take 10 mg by mouth at bedtime. 08/13/23  Yes Patsy Lenis, MD  metFORMIN  (GLUCOPHAGE -XR) 500 MG 24 hr tablet Take 500 mg by mouth daily. 04/14/23 04/13/24 Yes [provider]  modafinil  (PROVIGIL ) 100 MG tablet Take 1 tablet (100 mg total) by mouth daily. 08/14/23  Yes Patsy Lenis, MD  montelukast  (SINGULAIR ) 10 MG tablet Take 10 mg by mouth at bedtime.   Yes [provider]  omeprazole (PRILOSEC) 20 MG capsule Take 20 mg by mouth daily. 08/17/14  Yes [provider]  polyethylene glycol (MIRALAX  / GLYCOLAX ) 17 g packet Take 17 g by mouth daily as needed. 08/13/23  Yes Patsy Lenis, MD  propranolol  (INDERAL ) 20 MG tablet Take 20 mg by mouth 2 (two) times daily. 11/04/23 11/03/24 Yes [provider]  sennosides (SENOKOT) 8.8 MG/5ML syrup Take 5 mLs by mouth at bedtime. 08/13/23  Yes Patsy Lenis, MD  tamsulosin  (FLOMAX ) 0.4 MG CAPS capsule Take 0.4 mg by mouth daily after breakfast. 03/05/23 03/04/24 Yes [provider]    Physical Exam: Vitals:   03/02/24 2330 03/03/24 0000 03/03/24 0030 03/03/24 0100  BP: (!) 142/84 (!) 158/65 (!) 185/81 (!) 169/80  Pulse: (!)  59 64 79 66  Resp: 13 15 17 13   Temp:      TempSrc:      SpO2: 98% 98% 99% 97%   Physical Exam Vitals and nursing note reviewed.  Constitutional:      General: He is not in acute distress. HENT:     Head: Normocephalic and atraumatic.  Cardiovascular:     Rate and Rhythm: Normal rate and regular rhythm.     Heart sounds: Normal heart sounds.  Pulmonary:     Effort: Pulmonary effort is normal.     Breath sounds: Normal breath sounds.  Abdominal:     Palpations: Abdomen is soft.     Tenderness: There is no abdominal tenderness.  Neurological:     Mental Status: Mental status is at baseline.     Labs on Admission: I have personally reviewed following labs and imaging studies  CBC: Recent Labs  Lab 03/02/24 1520  WBC 5.1  HGB 10.6*  HCT 33.4*  MCV 98.2  PLT  158   Basic Metabolic Panel: Recent Labs  Lab 03/02/24 1802  NA 140  K 4.6  CL 105  CO2 26  GLUCOSE 166*  BUN 25*  CREATININE 1.41*  CALCIUM  8.3*   GFR: CrCl cannot be calculated (Unknown ideal weight.). Liver Function Tests: No results for input(s): AST, ALT, ALKPHOS, BILITOT, PROT, ALBUMIN  in the last 168 hours. Recent Labs  Lab 03/02/24 1520  LIPASE 43   No results for input(s): AMMONIA in the last 168 hours. Coagulation Profile: No results for input(s): INR, PROTIME in the last 168 hours. Cardiac Enzymes: No results for input(s): CKTOTAL, CKMB, CKMBINDEX, TROPONINI in the last 168 hours. BNP (last 3 results) No results for input(s): PROBNP in the last 8760 hours. HbA1C: No results for input(s): HGBA1C in the last 72 hours. CBG: Recent Labs  Lab 03/02/24 1805  GLUCAP 156*   Lipid Profile: No results for input(s): CHOL, HDL, LDLCALC, TRIG, CHOLHDL, LDLDIRECT in the last 72 hours. Thyroid  Function Tests: No results for input(s): TSH, T4TOTAL, FREET4, T3FREE, THYROIDAB in the last 72 hours. Anemia Panel: No results for input(s):  VITAMINB12, FOLATE, FERRITIN, TIBC, IRON, RETICCTPCT in the last 72 hours. Urine analysis:    Component Value Date/Time   COLORURINE YELLOW 10/04/2023 1913   APPEARANCEUR CLEAR 10/04/2023 1913   APPEARANCEUR Clear 06/22/2014 2230   LABSPEC 1.013 10/04/2023 1913   LABSPEC 1.028 06/22/2014 2230   PHURINE 7.0 10/04/2023 1913   GLUCOSEU NEGATIVE 10/04/2023 1913   GLUCOSEU Negative 06/22/2014 2230   HGBUR NEGATIVE 10/04/2023 1913   BILIRUBINUR NEGATIVE 10/04/2023 1913   BILIRUBINUR Negative 06/22/2014 2230   KETONESUR NEGATIVE 10/04/2023 1913   PROTEINUR NEGATIVE 10/04/2023 1913   NITRITE NEGATIVE 10/04/2023 1913   LEUKOCYTESUR TRACE (A) 10/04/2023 1913   LEUKOCYTESUR Negative 06/22/2014 2230    Radiological Exams on Admission: CT Angio Chest PE W/Cm &/Or Wo Cm Result Date: 03/03/2024 EXAM: CTA of the Chest with contrast for PE 03/03/2024 12:26:56 AM TECHNIQUE: CTA of the chest was performed without and with the administration of 75 mL of iohexol  (OMNIPAQUE ) 350 MG/ML injection. Multiplanar reformatted images are provided for review. MIP images are provided for review. Automated exposure control, iterative reconstruction, and/or weight based adjustment of the mA/kV was utilized to reduce the radiation dose to as low as reasonably achievable. COMPARISON: 07/27/2023 CLINICAL HISTORY: Pulmonary embolism (PE) suspected, low to intermediate prob, positive D-dimer. FINDINGS: PULMONARY ARTERIES: Pulmonary arteries are adequately opacified for evaluation. No pulmonary embolism. Main pulmonary artery is normal in caliber. MEDIASTINUM: Coronary artery and aortic atherosclerosis. LYMPH NODES: No mediastinal, hilar or axillary lymphadenopathy. LUNGS AND PLEURA: Scarring in the lingula and right middle lobe. No acute confluent airspace opacities. No pulmonary edema. No pleural effusion or pneumothorax. UPPER ABDOMEN: Limited images of the upper abdomen are unremarkable. SOFT TISSUES AND BONES: No  acute bone or soft tissue abnormality. IMPRESSION: 1. No pulmonary embolism. 2. No acute pulmonary abnormality. 3. Coronary artery disease. Aortic atherosclerosis. Electronically signed by: Franky Crease MD 03/03/2024 12:33 AM EST RP Workstation: [id number]   CT Head Wo Contrast Result Date: 03/02/2024 CLINICAL DATA:  Fall 2 weeks ago.  Episode of emesis today. EXAM: CT HEAD WITHOUT CONTRAST TECHNIQUE: Contiguous axial images were obtained from the base of the skull through the vertex without intravenous contrast. RADIATION DOSE REDUCTION: This exam was performed according to the departmental dose-optimization program which includes automated exposure control, adjustment of the mA and/or kV according to patient size and/or use of iterative reconstruction technique. COMPARISON:  09/19/2023,  07/21/2023 FINDINGS: Brain: Ventricles, cisterns and other CSF spaces are unchanged and within normal. There is minimal chronic ischemic microvascular disease present. No mass, mass effect, shift of midline structures or acute hemorrhage. Postsurgical/posttraumatic sequelae with thickening/calcification of the right frontal subdural region unchanged from 09/19/2023. This directly underlies the right frontal craniotomy defect. Vascular: No hyperdense vessel or unexpected calcification. Skull: Evidence of previous right frontal craniectomy. Sinuses/Orbits: Hypoplastic frontal sinuses as the paranasal sinuses are otherwise clear. Mastoid air cells are clear. Orbits are normal. Other: None. IMPRESSION: 1. No acute findings. 2. Minimal chronic ischemic microvascular disease. 3. Previous right frontal craniotomy with stable underlying postsurgical changes. Electronically Signed   By: Toribio Agreste M.D.   On: 03/02/2024 16:31   DG Chest 2 View Result Date: 03/02/2024 EXAM: 2 VIEW(S) XRAY OF THE CHEST 03/02/2024 03:51:00 PM COMPARISON: 08/31/2023 CLINICAL HISTORY: CP FINDINGS: LUNGS AND PLEURA: Low lung volumes. No focal pulmonary  opacity. No pleural effusion. No pneumothorax. HEART AND MEDIASTINUM: Cardiomegaly. Tortuous thoracic aorta. Atherosclerotic calcifications. BONES AND SOFT TISSUES: Multilevel degenerative changes of thoracic spine. No acute osseous abnormality. IMPRESSION: 1. No acute findings. 2. Cardiomegaly. Electronically signed by: Greig Pique MD 03/02/2024 04:29 PM EST RP Workstation: [id number]   Data Reviewed for HPI: Relevant notes from primary care and specialist visits, past discharge summaries as available in EHR, including Care Everywhere. Prior diagnostic testing as pertinent to current admission diagnoses Updated medications and problem lists for reconciliation ED course, including vitals, labs, imaging, treatment and response to treatment Triage notes, nursing and pharmacy notes and ED provider's notes Notable results as noted above in HPI      Assessment and Plan: * Precordial chest pain Patient with sudden onset chest pain and shortness of breath  troponin 23-24 and EKG nonacute CTA chest negative for PE Can consider cardiology consult Will get echo Will keep n.p.o. in case of an patient stress test in the a.m. Trial of NTG as needed chest pain  History of traumatic subdural hematoma s/p craniotomy No acute issues  BPH (benign prostatic hyperplasia) Continue tamsulosin   Parkinson disease (HCC) Trigeminal neuralgia Continue gabapentin  and carbamazepine   Hypertension Continue home meds  Diabetes mellitus, type 2 (HCC) Sliding scale insulin  coverage  Stage 3a chronic kidney disease (HCC) At baseline    DVT prophylaxis: Lovenox   Consults: none  Advance Care Planning:   Code Status: Prior   Family Communication: cardiology, [place]  Disposition Plan: Back to previous home environment  Severity of Illness: The appropriate patient status for this patient is OBSERVATION. Observation status is judged to be reasonable and necessary in order to provide the required  intensity of service to ensure the patient's safety. The patient's presenting symptoms, physical exam findings, and initial radiographic and laboratory data in the context of their medical condition is felt to place them at decreased risk for further clinical deterioration. Furthermore, it is anticipated that the patient will be medically stable for discharge from the hospital within 2 midnights of admission.   Author: Delayne LULLA Solian, MD 03/03/2024 1:17 AM  For on call review [email or web address].      [1]  Allergies Allergen Reactions   Dilaudid [Hydromorphone Hcl] Other (See Comments)    Respiratory arrest   "

## 2024-03-03 NOTE — Assessment & Plan Note (Addendum)
 Patient with sudden onset chest pain and shortness of breath  troponin 23-24 and EKG nonacute CTA chest negative for PE Can consider cardiology consult Will get echo Will keep n.p.o. in case of an patient stress test in the a.m. Trial of NTG as needed chest pain

## 2024-03-03 NOTE — Assessment & Plan Note (Signed)
"-   Continue home meds  "

## 2024-03-03 NOTE — Assessment & Plan Note (Signed)
"   No acute issues.         "

## 2024-03-03 NOTE — Consult Note (Addendum)
 " [facility] CLINIC CARDIOLOGY CONSULT NOTE       Patient ID: Jack White MRN: [id number] DOB/AGE: Jun 22, 1940 84 y.o.  Admit date: 03/02/2024 Referring Physician Dr. Delayne Solian Primary Physician Diedra Lame, MD  Primary Cardiologist none Reason for Consultation chest pain  HPI: Jack White is a 84 y.o. male  with a past medical history of hypertension, type 2 diabetes, CKD stage IIIa, BPH, history of traumatic subdural hematoma s/p craniotomy who presented to the ED on 03/02/2024 for shortness of breath and chest pain. Cardiology was consulted for further evaluation.   Patient presented to the ED for evaluation of episode of shortness of breath and chest discomfort after having problems with swallowing and then vomiting.  Workup in the ED notable for creatinine 1.41 (baseline), potassium 4.6, hemoglobin 10.6, WBC 5.1. Troponins 23 > 24. EKG in the ED normal sinus rhythm rate 63 bpm.  Chest x-ray without acute abnormality.  At the time of evaluation this morning patient is resting comfortably in hospital bed.  We discussed his symptoms in further detail.  He states that yesterday after eating lunch he was having trouble swallowing and then had an episode of vomiting.  He states that after this he began feeling short of breath and had some chest pressure.  States that otherwise he had been doing well recently with no issues.  Denies any symptoms of chest discomfort or shortness of breath with exertion.  States that he did have some dizziness yesterday.  Also endorses some occasional intermittent swelling but this is nothing that has been concerning for him.  He denies any episodes of palpitations or syncope.  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 Lucien   COLOSTOMY TAKEDOWN N/A 10/26/2014   Procedure: COLOSTOMY TAKEDOWN;  Surgeon: Elgin Laurence III, MD;  Location: [facility] ORS;  Service: General;  Laterality: N/A;   CRANIOTOMY Right 07/21/2023   Procedure: CRANIOTOMY HEMATOMA EVACUATION SUBDURAL;  Surgeon: Lanis Pupa, MD;  Location: [facility] OR;  Service: Neurosurgery;  Laterality: Right;   EXPLORATORY LAPAROTOMY  05/28/2014   Dr. Lucien   LAPAROSCOPIC CHOLECYSTECTOMY  05/21/2014   Dr. 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  Tobacco Use   Smoking status: Never   Smokeless tobacco: Never  Vaping Use   Vaping status: Never Used  Substance and Sexual Activity  Alcohol  use: No    Alcohol /week: 0.0 standard drinks of alcohol    Drug use: No   Sexual activity: Not on file  Other Topics Concern   Not on file  Social History Narrative   Not on file   Social Drivers of Health   Tobacco Use: Low Risk (12/09/2023)   Patient History    Smoking Tobacco Use: Never    Smokeless Tobacco Use: Never    Passive Exposure: Not on file  Financial Resource Strain: High Risk (10/07/2023)   Received from [facility]   Overall Financial Resource Strain (CARDIA)     How hard is it for you to pay for the very basics like food, housing, medical care, and heating?: Very hard  Food Insecurity: No Food Insecurity (10/07/2023)   Received from [facility]   Epic    Within the past 12 months, you worried that your food would run out before you got the money to buy more.: Never true    Within the past 12 months, the food you bought just didn't last and you didn't have money to get more.: Never true  Transportation Needs: No Transportation Needs (10/07/2023)   Received from [facility]   PRAPARE - Transportation    Lack of Transportation (Medical): No    Lack of Transportation (Non-Medical): No  Physical Activity: Not on file  Stress: Not on file  Social Connections: Moderately Integrated (07/24/2023)   Social Connection and Isolation Panel    Frequency of Communication with Friends and Family: Three times a week    Frequency of Social Gatherings with Friends and Family: Three times a week    Attends Religious Services: More than 4 times per year    Active Member of Clubs or Organizations: Yes    Attends Banker Meetings: 1 to 4 times per year    Marital Status: Widowed  Intimate Partner Violence: Not At Risk (07/22/2023)   Humiliation, Afraid, Rape, and Kick questionnaire    Fear of Current or Ex-Partner: No    Emotionally Abused: No    Physically Abused: No    Sexually Abused: No  Depression (PHQ2-9): Not on file  Alcohol  Screen: Not on file  Housing: Low Risk (07/22/2023)   Housing Stability Vital Sign    Unable to Pay for Housing in the Last Year: No    Number of Times Moved in the Last Year: 0    Homeless in the Last Year: No  Utilities: Not At Risk (07/22/2023)   A[facility] Utilities    Threatened with loss of utilities: No  Health Literacy: Not on file    Family History  Problem Relation Age of Onset   Diabetes Mother    Hypertension Mother    Alzheimer's disease Mother    Heart disease Father    Diabetes Sister      Vitals:    03/03/24 0400 03/03/24 0600 03/03/24 0759 03/03/24 1028  BP: (!) 156/66  (!) 158/64 (!) 167/52  Pulse: 69  62 (!) 56  Resp: 18  20   Temp: 97.8 F (36.6 C)  97.8 F (36.6 C)   TempSrc: Oral  Oral   SpO2: 100%  99%   Weight:  93.1 kg      PHYSICAL EXAM General: Chronically ill-appearing elderly male, well nourished, in no acute distress. HEENT: Normocephalic and atraumatic. Neck: No JVD.  Lungs: Normal respiratory effort on room air. Clear bilaterally to auscultation. No wheezes, crackles, rhonchi.  Heart: HRRR. Normal  S1 and S2 without gallops or murmurs.  Abdomen: Non-distended appearing.  Msk: Normal strength and tone for age. Extremities: Warm and well perfused. No clubbing, cyanosis.  No edema.  Neuro: Alert and oriented X 3. Psych: Answers questions appropriately.   Labs: Basic Metabolic Panel: Recent Labs    03/02/24 1802  NA 140  K 4.6  CL 105  CO2 26  GLUCOSE 166*  BUN 25*  CREATININE 1.41*  CALCIUM  8.3*   Liver Function Tests: No results for input(s): AST, ALT, ALKPHOS, BILITOT, PROT, ALBUMIN  in the last 72 hours. Recent Labs    03/02/24 1520  LIPASE 43   CBC: Recent Labs    03/02/24 1520  WBC 5.1  HGB 10.6*  HCT 33.4*  MCV 98.2  PLT 158   Cardiac Enzymes: No results for input(s): CKTOTAL, CKMB, CKMBINDEX, TROPONINIHS in the last 72 hours. BNP: No results for input(s): BNP in the last 72 hours. D-Dimer: Recent Labs    03/02/24 2239  DDIMER 0.83*   Hemoglobin A1C: No results for input(s): HGBA1C in the last 72 hours. Fasting Lipid Panel: No results for input(s): CHOL, HDL, LDLCALC, TRIG, CHOLHDL, LDLDIRECT in the last 72 hours. Thyroid  Function Tests: No results for input(s): TSH, T4TOTAL, T3FREE, THYROIDAB in the last 72 hours.  Invalid input(s): FREET3 Anemia Panel: No results for input(s): VITAMINB12, FOLATE, FERRITIN, TIBC, IRON, RETICCTPCT in the last 72 hours.    Radiology: CT Angio Chest PE W/Cm &/Or Wo Cm Result Date: 03/03/2024 EXAM: CTA of the Chest with contrast for PE 03/03/2024 12:26:56 AM TECHNIQUE: CTA of the chest was performed without and with the administration of 75 mL of iohexol  (OMNIPAQUE ) 350 MG/ML injection. Multiplanar reformatted images are provided for review. MIP images are provided for review. Automated exposure control, iterative reconstruction, and/or weight based adjustment of the mA/kV was utilized to reduce the radiation dose to as low as reasonably achievable. COMPARISON: 07/27/2023 CLINICAL HISTORY: Pulmonary embolism (PE) suspected, low to intermediate prob, positive D-dimer. FINDINGS: PULMONARY ARTERIES: Pulmonary arteries are adequately opacified for evaluation. No pulmonary embolism. Main pulmonary artery is normal in caliber. MEDIASTINUM: Coronary artery and aortic atherosclerosis. LYMPH NODES: No mediastinal, hilar or axillary lymphadenopathy. LUNGS AND PLEURA: Scarring in the lingula and right middle lobe. No acute confluent airspace opacities. No pulmonary edema. No pleural effusion or pneumothorax. UPPER ABDOMEN: Limited images of the upper abdomen are unremarkable. SOFT TISSUES AND BONES: No acute bone or soft tissue abnormality. IMPRESSION: 1. No pulmonary embolism. 2. No acute pulmonary abnormality. 3. Coronary artery disease. Aortic atherosclerosis. Electronically signed by: Franky Crease MD 03/03/2024 12:33 AM EST RP Workstation: [id number]   CT Head Wo Contrast Result Date: 03/02/2024 CLINICAL DATA:  Fall 2 weeks ago.  Episode of emesis today. EXAM: CT HEAD WITHOUT CONTRAST TECHNIQUE: Contiguous axial images were obtained from the base of the skull through the vertex without intravenous contrast. RADIATION DOSE REDUCTION: This exam was performed according to the departmental dose-optimization program which includes automated exposure control, adjustment of the mA and/or kV according to patient size and/or use of iterative  reconstruction technique. COMPARISON:  09/19/2023, 07/21/2023 FINDINGS: Brain: Ventricles, cisterns and other CSF spaces are unchanged and within normal. There is minimal chronic ischemic microvascular disease present. No mass, mass effect, shift of midline structures or acute hemorrhage. Postsurgical/posttraumatic sequelae with thickening/calcification of the right frontal subdural region unchanged from 09/19/2023. This directly underlies the right frontal craniotomy defect. Vascular: No hyperdense vessel or unexpected calcification. Skull: Evidence of previous right frontal craniectomy. Sinuses/Orbits:  Hypoplastic frontal sinuses as the paranasal sinuses are otherwise clear. Mastoid air cells are clear. Orbits are normal. Other: None. IMPRESSION: 1. No acute findings. 2. Minimal chronic ischemic microvascular disease. 3. Previous right frontal craniotomy with stable underlying postsurgical changes. Electronically Signed   By: Toribio Agreste M.D.   On: 03/02/2024 16:31   DG Chest 2 View Result Date: 03/02/2024 EXAM: 2 VIEW(S) XRAY OF THE CHEST 03/02/2024 03:51:00 PM COMPARISON: 08/31/2023 CLINICAL HISTORY: CP FINDINGS: LUNGS AND PLEURA: Low lung volumes. No focal pulmonary opacity. No pleural effusion. No pneumothorax. HEART AND MEDIASTINUM: Cardiomegaly. Tortuous thoracic aorta. Atherosclerotic calcifications. BONES AND SOFT TISSUES: Multilevel degenerative changes of thoracic spine. No acute osseous abnormality. IMPRESSION: 1. No acute findings. 2. Cardiomegaly. Electronically signed by: Greig Pique MD 03/02/2024 04:29 PM EST RP Workstation: [id number]    ECHO ordered  TELEMETRY (personally reviewed): Sinus rhythm rate 60s  EKG (personally reviewed): Normal sinus rhythm rate 63 bpm  Data reviewed by me 03/03/2024: last 24h vitals tele labs imaging I/O ED provider note, admission H&P  Principal Problem:   Precordial chest pain Active Problems:   Stage 3a chronic kidney disease (HCC)   Diabetes  mellitus, type 2 (HCC)   Hypertension   Parkinson disease (HCC)   Trigeminal neuralgia   BPH (benign prostatic hyperplasia)   History of traumatic subdural hematoma s/p craniotomy   Chest pain    ASSESSMENT AND PLAN:  Jack White is a 84 y.o. male  with a past medical history of hypertension, type 2 diabetes, CKD stage IIIa, BPH, history of traumatic subdural hematoma s/p craniotomy who presented to the ED on 03/02/2024 for shortness of breath and chest pain. Cardiology was consulted for further evaluation.   # Chest pain # Hypertension # Chronic kidney disease stage IIIa Patient presented with complaints of shortness of breath and chest discomfort after an episode of vomiting yesterday.  Troponins are minimal at 23 and 24.  Chest x-ray without acute abnormality.  EKG without acute ischemic changes.  Overall feeling improved today. - Will plan for nuclear stress test today for additional evaluation. - Echo ordered, further recommendations pending these results. - S/p ASA 325 in the ED.  Continue atorvastatin  10 mg daily and aspirin  81 mg daily. - Continue propranolol  20 mg twice daily.  ADDENDUM: Abnormal stress test, will plan to set patient up for [facility] tomorrow. Patient updated by Dr. Laurita. I will discuss procedure in detail tomorrow morning with the patient.    This patient's plan of care was discussed and created with Dr. Florencio and he is in agreement.  Signed: Danita Bloch, PA-C  03/03/2024, 12:28 PM [facility]      "

## 2024-03-03 NOTE — Assessment & Plan Note (Signed)
 At baseline

## 2024-03-03 NOTE — Assessment & Plan Note (Signed)
 Sliding scale insulin  coverage

## 2024-03-03 NOTE — Hospital Course (Addendum)
 Jack White is a 84 y.o. 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  Patient has elevated D-dimer, CT angiogram was negative for PE. Patient also had a stress nuclear stress test, was positive for ischemia.  Patient had a heart cath performed on 1/8, placed to LAD drug-eluting stent.

## 2024-03-03 NOTE — Assessment & Plan Note (Signed)
 Continue tamsulosin.

## 2024-03-03 NOTE — ED Notes (Addendum)
Pt to nuclear med for stress test.

## 2024-03-04 ENCOUNTER — Other Ambulatory Visit: Payer: Self-pay

## 2024-03-04 ENCOUNTER — Encounter
Admission: EM | Disposition: A | Discharge status: Skilled Nursing Facility | Payer: Self-pay | Source: Home / Self Care | Attending: Internal Medicine

## 2024-03-04 ENCOUNTER — Observation Stay
Admit: 2024-03-04 | Discharge: 2024-03-04 | Disposition: A | Discharge status: Home / Self Care | Attending: Internal Medicine | Admitting: Internal Medicine

## 2024-03-04 DIAGNOSIS — E440 Moderate protein-calorie malnutrition: Secondary | ICD-10-CM | POA: Diagnosis present

## 2024-03-04 DIAGNOSIS — R0602 Shortness of breath: Secondary | ICD-10-CM | POA: Diagnosis present

## 2024-03-04 DIAGNOSIS — R072 Precordial pain: Secondary | ICD-10-CM | POA: Diagnosis not present

## 2024-03-04 DIAGNOSIS — Z23 Encounter for immunization: Secondary | ICD-10-CM | POA: Diagnosis present

## 2024-03-04 DIAGNOSIS — Z8249 Family history of ischemic heart disease and other diseases of the circulatory system: Secondary | ICD-10-CM | POA: Diagnosis not present

## 2024-03-04 DIAGNOSIS — I25110 Atherosclerotic heart disease of native coronary artery with unstable angina pectoris: Secondary | ICD-10-CM | POA: Diagnosis present

## 2024-03-04 DIAGNOSIS — I9589 Other hypotension: Secondary | ICD-10-CM | POA: Diagnosis not present

## 2024-03-04 DIAGNOSIS — I10 Essential (primary) hypertension: Secondary | ICD-10-CM | POA: Diagnosis not present

## 2024-03-04 DIAGNOSIS — D508 Other iron deficiency anemias: Secondary | ICD-10-CM | POA: Diagnosis not present

## 2024-03-04 DIAGNOSIS — E871 Hypo-osmolality and hyponatremia: Secondary | ICD-10-CM | POA: Diagnosis not present

## 2024-03-04 DIAGNOSIS — Z66 Do not resuscitate: Secondary | ICD-10-CM | POA: Diagnosis not present

## 2024-03-04 DIAGNOSIS — E1165 Type 2 diabetes mellitus with hyperglycemia: Secondary | ICD-10-CM | POA: Diagnosis present

## 2024-03-04 DIAGNOSIS — Z7982 Long term (current) use of aspirin: Secondary | ICD-10-CM | POA: Diagnosis not present

## 2024-03-04 DIAGNOSIS — D696 Thrombocytopenia, unspecified: Secondary | ICD-10-CM | POA: Diagnosis present

## 2024-03-04 DIAGNOSIS — I200 Unstable angina: Secondary | ICD-10-CM | POA: Diagnosis not present

## 2024-03-04 DIAGNOSIS — D62 Acute posthemorrhagic anemia: Secondary | ICD-10-CM | POA: Diagnosis not present

## 2024-03-04 DIAGNOSIS — E1122 Type 2 diabetes mellitus with diabetic chronic kidney disease: Secondary | ICD-10-CM | POA: Diagnosis present

## 2024-03-04 DIAGNOSIS — L89152 Pressure ulcer of sacral region, stage 2: Secondary | ICD-10-CM | POA: Diagnosis not present

## 2024-03-04 DIAGNOSIS — G20A1 Parkinson's disease without dyskinesia, without mention of fluctuations: Secondary | ICD-10-CM

## 2024-03-04 DIAGNOSIS — I129 Hypertensive chronic kidney disease with stage 1 through stage 4 chronic kidney disease, or unspecified chronic kidney disease: Secondary | ICD-10-CM | POA: Diagnosis present

## 2024-03-04 DIAGNOSIS — Z79899 Other long term (current) drug therapy: Secondary | ICD-10-CM | POA: Diagnosis not present

## 2024-03-04 DIAGNOSIS — N1831 Chronic kidney disease, stage 3a: Secondary | ICD-10-CM | POA: Diagnosis present

## 2024-03-04 DIAGNOSIS — E785 Hyperlipidemia, unspecified: Secondary | ICD-10-CM | POA: Diagnosis present

## 2024-03-04 DIAGNOSIS — D509 Iron deficiency anemia, unspecified: Secondary | ICD-10-CM | POA: Diagnosis present

## 2024-03-04 DIAGNOSIS — N179 Acute kidney failure, unspecified: Secondary | ICD-10-CM | POA: Diagnosis not present

## 2024-03-04 DIAGNOSIS — N401 Enlarged prostate with lower urinary tract symptoms: Secondary | ICD-10-CM | POA: Diagnosis present

## 2024-03-04 DIAGNOSIS — Z7984 Long term (current) use of oral hypoglycemic drugs: Secondary | ICD-10-CM | POA: Diagnosis not present

## 2024-03-04 DIAGNOSIS — E875 Hyperkalemia: Secondary | ICD-10-CM | POA: Diagnosis not present

## 2024-03-04 DIAGNOSIS — Z7902 Long term (current) use of antithrombotics/antiplatelets: Secondary | ICD-10-CM | POA: Diagnosis not present

## 2024-03-04 HISTORY — PX: CORONARY STENT INTERVENTION: CATH118234

## 2024-03-04 HISTORY — PX: LEFT HEART CATH AND CORONARY ANGIOGRAPHY: CATH118249

## 2024-03-04 LAB — BASIC METABOLIC PANEL WITH GFR
Anion gap: 7 (ref 5–15)
BUN: 23 mg/dL (ref 8–23)
CO2: 27 mmol/L (ref 22–32)
Calcium: 7.9 mg/dL — ABNORMAL LOW (ref 8.9–10.3)
Chloride: 102 mmol/L (ref 98–111)
Creatinine, Ser: 1.3 mg/dL — ABNORMAL HIGH (ref 0.61–1.24)
GFR, Estimated: 54 mL/min — ABNORMAL LOW
Glucose, Bld: 107 mg/dL — ABNORMAL HIGH (ref 70–99)
Potassium: 5.1 mmol/L (ref 3.5–5.1)
Sodium: 136 mmol/L (ref 135–145)

## 2024-03-04 LAB — CBC
HCT: 30.1 % — ABNORMAL LOW (ref 39.0–52.0)
Hemoglobin: 10.1 g/dL — ABNORMAL LOW (ref 13.0–17.0)
MCH: 32.1 pg (ref 26.0–34.0)
MCHC: 33.6 g/dL (ref 30.0–36.0)
MCV: 95.6 fL (ref 80.0–100.0)
Platelets: 147 K/uL — ABNORMAL LOW (ref 150–400)
RBC: 3.15 MIL/uL — ABNORMAL LOW (ref 4.22–5.81)
RDW: 12.2 % (ref 11.5–15.5)
WBC: 5.2 K/uL (ref 4.0–10.5)
nRBC: 0 % (ref 0.0–0.2)

## 2024-03-04 LAB — POCT ACTIVATED CLOTTING TIME
Activated Clotting Time: 302 s
Activated Clotting Time: 327 s
Activated Clotting Time: 276 s

## 2024-03-04 LAB — LIPOPROTEIN A (LPA): Lipoprotein (a): 148 nmol/L — ABNORMAL HIGH

## 2024-03-04 LAB — GLUCOSE, CAPILLARY: Glucose-Capillary: 160 mg/dL — ABNORMAL HIGH (ref 70–99)

## 2024-03-04 MED ORDER — FREE WATER: 500.0000 mL | Freq: Once | Status: DC

## 2024-03-04 MED ORDER — HEPARIN (PORCINE) IN NACL 1000-0.9 UT/500ML-% IV SOLN
INTRAVENOUS | Status: AC
  Filled 2024-03-04: qty 1000

## 2024-03-04 MED ORDER — HEPARIN SODIUM (PORCINE) 1000 UNIT/ML IJ SOLN
INTRAMUSCULAR | Status: AC
  Filled 2024-03-04: qty 10

## 2024-03-04 MED ORDER — SODIUM CHLORIDE 0.9 % IV BOLUS
INTRAVENOUS | Status: AC | PRN
  Administered 2024-03-04: 250 mL via INTRAVENOUS
  Administered 2024-03-04: 250 mL/h via INTRAVENOUS

## 2024-03-04 MED ORDER — CLOPIDOGREL BISULFATE 75 MG PO TABS
ORAL_TABLET | ORAL | Status: DC | PRN
  Administered 2024-03-04: 600 mg via ORAL

## 2024-03-04 MED ORDER — MIDAZOLAM HCL (PF) 2 MG/2ML IJ SOLN
INTRAMUSCULAR | Status: DC | PRN
  Administered 2024-03-04: .5 mg via INTRAVENOUS

## 2024-03-04 MED ORDER — SODIUM CHLORIDE 0.9 % IV SOLN: INTRAVENOUS | Status: AC

## 2024-03-04 MED ORDER — ONDANSETRON HCL 4 MG/2ML IJ SOLN
INTRAMUSCULAR | Status: AC
  Filled 2024-03-04: qty 2

## 2024-03-04 MED ORDER — VERAPAMIL HCL 2.5 MG/ML IV SOLN
INTRAVENOUS | Status: AC
  Filled 2024-03-04: qty 2

## 2024-03-04 MED ORDER — HEPARIN SODIUM (PORCINE) 1000 UNIT/ML IJ SOLN
INTRAMUSCULAR | Status: DC | PRN
  Administered 2024-03-04: 8000 [IU] via INTRAVENOUS
  Administered 2024-03-04: 4000 [IU] via INTRAVENOUS

## 2024-03-04 MED ORDER — HEPARIN (PORCINE) IN NACL 1000-0.9 UT/500ML-% IV SOLN
INTRAVENOUS | Status: DC | PRN
  Administered 2024-03-04: 1000 mL

## 2024-03-04 MED ORDER — CLOPIDOGREL BISULFATE 75 MG PO TABS
ORAL_TABLET | ORAL | Status: AC
  Filled 2024-03-04: qty 8

## 2024-03-04 MED ORDER — DIPHENHYDRAMINE HCL 25 MG PO CAPS
25.0000 mg | ORAL_CAPSULE | Freq: Every day | ORAL | Status: DC
  Administered 2024-03-04 – 2024-03-16 (×13): 25 mg via ORAL
  Filled 2024-03-04 (×13): qty 1

## 2024-03-04 MED ORDER — VERAPAMIL HCL 2.5 MG/ML IV SOLN
INTRAVENOUS | Status: DC | PRN
  Administered 2024-03-04: 2.5 mg via INTRACORONARY

## 2024-03-04 MED ORDER — HYDRALAZINE HCL 20 MG/ML IJ SOLN: 10.0000 mg | INTRAMUSCULAR | Status: AC | PRN

## 2024-03-04 MED ORDER — CLOPIDOGREL BISULFATE 75 MG PO TABS
75.0000 mg | ORAL_TABLET | Freq: Every day | ORAL | Status: DC
  Administered 2024-03-05 – 2024-03-17 (×13): 75 mg via ORAL
  Filled 2024-03-04 (×13): qty 1

## 2024-03-04 MED ORDER — LIDOCAINE HCL 1 % IJ SOLN
INTRAMUSCULAR | Status: AC
  Filled 2024-03-04: qty 20

## 2024-03-04 MED ORDER — SODIUM CHLORIDE 0.9% FLUSH
3.0000 mL | Freq: Two times a day (BID) | INTRAVENOUS | Status: DC
  Administered 2024-03-04: 3 mL via INTRAVENOUS

## 2024-03-04 MED ORDER — MIDAZOLAM HCL 2 MG/2ML IJ SOLN
INTRAMUSCULAR | Status: AC
  Filled 2024-03-04: qty 2

## 2024-03-04 MED ORDER — ASPIRIN 81 MG PO CHEW
81.0000 mg | CHEWABLE_TABLET | Freq: Every day | ORAL | Status: DC
  Administered 2024-03-05 – 2024-03-17 (×13): 81 mg via ORAL
  Filled 2024-03-04 (×13): qty 1

## 2024-03-04 MED ORDER — ASPIRIN 81 MG PO CHEW
CHEWABLE_TABLET | ORAL | Status: AC
  Filled 2024-03-04: qty 3

## 2024-03-04 MED ORDER — IOHEXOL 300 MG/ML  SOLN
INTRAMUSCULAR | Status: DC | PRN
  Administered 2024-03-04: 281 mL

## 2024-03-04 MED ORDER — ASPIRIN 81 MG PO CHEW
CHEWABLE_TABLET | ORAL | Status: DC | PRN
  Administered 2024-03-04: 243 mg via ORAL

## 2024-03-04 MED ORDER — FENTANYL CITRATE (PF) 100 MCG/2ML IJ SOLN
INTRAMUSCULAR | Status: AC
  Filled 2024-03-04: qty 2

## 2024-03-04 MED ORDER — SODIUM CHLORIDE 0.9% FLUSH: 3.0000 mL | INTRAVENOUS | Status: DC | PRN

## 2024-03-04 MED ORDER — SODIUM CHLORIDE 0.9 % IV SOLN: 250.0000 mL | INTRAVENOUS | Status: AC | PRN

## 2024-03-04 MED ORDER — ASPIRIN 81 MG PO CHEW: 81.0000 mg | CHEWABLE_TABLET | ORAL | Status: DC

## 2024-03-04 MED ORDER — LIDOCAINE HCL (PF) 1 % IJ SOLN
INTRAMUSCULAR | Status: DC | PRN
  Administered 2024-03-04: 5 mL

## 2024-03-04 NOTE — Progress Notes (Signed)
" °  Progress Note   Patient: Jack White FMW:[id number] DOB: 05-Aug-1940 DOA: 03/02/2024     0 DOS: the patient was seen and examined on 03/04/2024   Brief hospital course: Jack White is a 84 y.o. 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  Patient has elevated D-dimer, CT angiogram was negative for PE. Patient also had a stress nuclear stress test, was positive for ischemia.  Scheduled for heart cath tomorrow.   Principal Problem:   Precordial chest pain Active Problems:   Stage 3a chronic kidney disease (HCC)   Diabetes mellitus, type 2 (HCC)   Hypertension   Parkinson disease (HCC)   Trigeminal neuralgia   BPH (benign prostatic hyperplasia)   History of traumatic subdural hematoma s/p craniotomy   Chest pain   Thrombocytopenia   Assessment and Plan:  Precordial chest pain Negative for PE, positive nuclear stress test.   Heart cath scheduled for 2:30 PM today.   History of traumatic subdural hematoma s/p craniotomy No acute issues   BPH (benign prostatic hyperplasia) Continue tamsulosin    Parkinson disease (HCC) Trigeminal neuralgia Continue gabapentin  and carbamazepine    Hypertension Continue home meds   Diabetes mellitus, type 2 (HCC) Sliding scale insulin  coverage   Stage 3a chronic kidney disease (HCC) At baseline        Subjective:  Patient is quite sleepy this morning, denies any short of breath or chest pain.  Physical Exam: Vitals:   03/03/24 2314 03/04/24 0509 03/04/24 0933 03/04/24 0934  BP: 139/62 (!) 95/53 127/73   Pulse: 61 (!) 56 63 62  Resp: 20 20 16    Temp: 97.9 F (36.6 C) (!) 97.4 F (36.3 C) 97.8 F (36.6 C)   TempSrc:  Oral Oral   SpO2: 96% 96% 94%   Weight:       General exam: Appears calm and comfortable  Respiratory system: Clear to  auscultation. Respiratory effort normal. Cardiovascular system: S1 & S2 heard, RRR. No JVD, murmurs, rubs, gallops or clicks. No pedal edema. Gastrointestinal system: Abdomen is nondistended, soft and nontender. No organomegaly or masses felt. Normal bowel sounds heard. Central nervous system: Drowsy and oriented x2. No focal neurological deficits. Extremities: Symmetric 5 x 5 power. Skin: No rashes, lesions or ulcers Psychiatry: Judgement and insight appear normal. Mood & affect appropriate.    Data Reviewed:  Lab results reviewed.  Family Communication: Wife updated over the phone  Disposition: Status is: Observation      Time spent: 35 minutes  Author: Murvin Mana, MD 03/04/2024 1:39 PM  For on call review [email or web address].    "

## 2024-03-04 NOTE — CV Procedure (Signed)
 Brief cardiac cath PCI stent note Brought to the cardiac Cath Lab for inpatient cath positive imaging and anginal symptom Right radial approach Normal left ventricular function EF of at least 55%  Coronaries Left main Short free of disease  LAD large 95% mid Circumflex very large dominant no significant disease RCA small nondominant  Intervention Successful PCI and stent DES to mid LAD 2.75 x 15 mm Onyx frontier Onyx Postdilated with 2.75 x 12 mm [place] neo to 16 atm   Patient on Plavix  load Aspirin  81  Heparin  discontinued IV hydration over the next 12 hours Anticipate discharge within 23 hours  Patient tolerated procedure well No complications Full cath PCI note to follow  Jack Lovelace MD Cardiology

## 2024-03-04 NOTE — Progress Notes (Signed)
*  PRELIMINARY RESULTS* Echocardiogram 2D Echocardiogram has been performed.  Jack White 03/04/2024, 10:08 AM

## 2024-03-04 NOTE — Progress Notes (Signed)
 Provider was notified about the patient's infiltrated IV site that bruised up to the elbow. Bruising marked for assessment. Left arm wrapped in a coban, elevated and ice applied as ordered by [facility], MD. Cap refill <3, and radial pulse 3+.

## 2024-03-04 NOTE — Progress Notes (Signed)
 " [facility] CLINIC CARDIOLOGY PROGRESS NOTE       Patient ID: Jack White MRN: [id number] DOB/AGE: 1940-07-25 84 y.o.  Admit date: 03/02/2024 Referring Physician Dr. Delayne Solian Primary Physician Diedra Lame, MD  Primary Cardiologist none Reason for Consultation chest pain  HPI: Jack White is a 84 y.o. male  with a past medical history of hypertension, type 2 diabetes, CKD stage IIIa, BPH, history of traumatic subdural hematoma s/p craniotomy who presented to the ED on 03/02/2024 for shortness of breath and chest pain. Cardiology was consulted for further evaluation.   Interval history: -Patient seen and examined this AM, resting in hospital bed.  -Reports feeling well this AM with no recurrence of CP.  -Reviewed stress test results with patient and daughter via phone. They were both updated on plan for LHC today.  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 Lucien   COLOSTOMY TAKEDOWN N/A 10/26/2014   Procedure: COLOSTOMY TAKEDOWN;  Surgeon: Elgin Laurence III, MD;  Location: [facility] ORS;  Service: General;  Laterality: N/A;   CRANIOTOMY Right 07/21/2023   Procedure: CRANIOTOMY HEMATOMA EVACUATION SUBDURAL;  Surgeon: Lanis Pupa, MD;  Location: [facility] OR;  Service: Neurosurgery;  Laterality: Right;   EXPLORATORY LAPAROTOMY  05/28/2014   Dr. Lucien   LAPAROSCOPIC  CHOLECYSTECTOMY  05/21/2014   Dr. Laurence    Medications Prior to Admission  Medication Sig Dispense Refill Last Dose/Taking   acetaminophen  (TYLENOL ) 500 MG tablet Take 500-1,000 mg by mouth 2 (two) times daily.   Unknown   atorvastatin  (LIPITOR) 10 MG tablet Take 1 tablet (10 mg total) by mouth at bedtime.   03/01/2024 Bedtime   butalbital -aspirin -caffeine  (FIORINAL) 50-325-40 MG capsule TAKE 1 CAPSULE BY MOUTH EVERY 6 HOURS AS NEEDED FOR HEADACHE   Unknown   carbamazepine  (TEGRETOL ) 200 MG tablet Take 1 tablet (200 mg total) by mouth 3 (three) times daily.   03/02/2024 Morning   diphenhydrAMINE  (BENADRYL ) 25 MG tablet Take 2 tablets (50 mg total) by mouth at bedtime.   03/01/2024 Bedtime   doxazosin  (CARDURA ) 4 MG tablet Take 4 mg by mouth daily.   03/02/2024 Morning   DULoxetine  (CYMBALTA ) 60 MG capsule Take 60 mg by mouth daily.   03/02/2024 Morning   ergocalciferol  (VITAMIN D2) 1.25 MG (50000 UT) capsule Take 50,000 Units by mouth once a week.   Past Week   ferrous sulfate  325 (65 FE) MG tablet Take 1 tablet (325 mg total) by mouth daily with breakfast.   03/02/2024 Morning   fluticasone  (FLONASE ) 50 MCG/ACT nasal spray Place 2 sprays into both nostrils daily as needed for allergies.   Unknown   folic acid  (FOLVITE ) 1 MG tablet Take 1 tablet (1 mg total) by mouth daily.   03/02/2024 Morning   gabapentin  (NEURONTIN ) 300 MG capsule Take 300 mg by mouth  at bedtime.   03/01/2024 Bedtime   guaiFENesin -dextromethorphan (ROBITUSSIN DM) 100-10 MG/5ML syrup Take 5 mLs by mouth every 4 (four) hours as needed for cough. 118 mL 0 Unknown   hydrocortisone  cream 1 % Apply 1 Application topically 3 (three) times daily as needed for itching.   Unknown   hydroxypropyl methylcellulose / hypromellose (ISOPTO TEARS / GONIOVISC) 2.5 % ophthalmic solution Place 2 drops into both eyes 4 (four) times daily as needed for dry eyes.   Unknown   ipratropium-albuterol  (DUONEB) 0.5-2.5 (3) MG/3ML SOLN Take 3 mLs by nebulization every 6  (six) hours as needed (wheezing).   Unknown   melatonin 10 MG TABS Take 10 mg by mouth at bedtime.   03/01/2024 Bedtime   metFORMIN  (GLUCOPHAGE -XR) 500 MG 24 hr tablet Take 500 mg by mouth daily.   03/02/2024 Morning   modafinil  (PROVIGIL ) 100 MG tablet Take 1 tablet (100 mg total) by mouth daily. 10 tablet 0 03/02/2024 Morning   montelukast  (SINGULAIR ) 10 MG tablet Take 10 mg by mouth at bedtime.   03/01/2024 Bedtime   omeprazole (PRILOSEC) 20 MG capsule Take 20 mg by mouth daily.   03/02/2024 Morning   polyethylene glycol (MIRALAX  / GLYCOLAX ) 17 g packet Take 17 g by mouth daily as needed.   Unknown   propranolol  (INDERAL ) 20 MG tablet Take 20 mg by mouth 2 (two) times daily.   03/02/2024 Morning   sennosides (SENOKOT) 8.8 MG/5ML syrup Take 5 mLs by mouth at bedtime.   03/01/2024 Bedtime   tamsulosin  (FLOMAX ) 0.4 MG CAPS capsule Take 0.4 mg by mouth daily after breakfast.   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  Tobacco Use   Smoking status: Never   Smokeless tobacco: Never  Vaping Use   Vaping status: Never Used  Substance and Sexual Activity   Alcohol  use: No    Alcohol /week: 0.0 standard drinks of alcohol    Drug use: No   Sexual activity: Not on file  Other Topics Concern   Not on file  Social History Narrative   Not on file   Social Drivers of Health   Tobacco Use: Low Risk (12/09/2023)   Patient History    Smoking Tobacco Use: Never    Smokeless Tobacco Use: Never    Passive Exposure: Not on file  Financial Resource Strain: High Risk (10/07/2023)   Received from [facility]   Overall  Financial Resource Strain (CARDIA)    How hard is it for you to pay for the very basics like food, housing, medical care, and heating?: Very hard  Food Insecurity: No Food Insecurity (03/03/2024)   Epic    Worried About Radiation Protection Practitioner of Food in the Last Year: Never true    Ran Out of Food in the Last Year: Never true  Transportation Needs: No Transportation Needs (03/03/2024)   Epic    Lack of Transportation (Medical): No    Lack of Transportation (Non-Medical): No  Physical Activity: Not on file  Stress: Not on file  Social Connections: Moderately Integrated (03/03/2024)  Social Connection and Isolation Panel    Frequency of Communication with Friends and Family: Three times a week    Frequency of Social Gatherings with Friends and Family: Three times a week    Attends Religious Services: More than 4 times per year    Active Member of Clubs or Organizations: Yes    Attends Banker Meetings: 1 to 4 times per year    Marital Status: Widowed  Intimate Partner Violence: Not At Risk (03/03/2024)   Epic    Fear of Current or Ex-Partner: No    Emotionally Abused: No    Physically Abused: No    Sexually Abused: No  Depression (PHQ2-9): Not on file  Alcohol  Screen: Not on file  Housing: Low Risk (03/03/2024)   Epic    Unable to Pay for Housing in the Last Year: No    Number of Times Moved in the Last Year: 0    Homeless in the Last Year: No  Utilities: Not At Risk (03/03/2024)   Epic    Threatened with loss of utilities: No  Health Literacy: Not on file    Family History  Problem Relation Age of Onset   Diabetes Mother    Hypertension Mother    Alzheimer's disease Mother    Heart disease Father    Diabetes Sister      Vitals:   03/03/24 1958 03/03/24 2142 03/03/24 2314 03/04/24 0509  BP:  (!) 185/71 139/62 (!) 95/53  Pulse:  63 61 (!) 56  Resp:  20 20 20   Temp:  97.9 F (36.6 C) 97.9 F (36.6 C) (!) 97.4 F (36.3 C)  TempSrc:  Oral  Oral  SpO2: 100% 99% 96% 96%   Weight:        PHYSICAL EXAM General: Chronically ill-appearing elderly male, well nourished, in no acute distress. HEENT: Normocephalic and atraumatic. Neck: No JVD.  Lungs: Normal respiratory effort on room air. Clear bilaterally to auscultation. No wheezes, crackles, rhonchi.  Heart: HRRR. Normal S1 and S2 without gallops or murmurs.  Abdomen: Non-distended appearing.  Msk: Normal strength and tone for age. Extremities: Warm and well perfused. No clubbing, cyanosis.  No edema.  Neuro: Alert and oriented X 3. Psych: Answers questions appropriately.   Labs: Basic Metabolic Panel: Recent Labs    03/02/24 1802 03/04/24 0352  NA 140 136  K 4.6 5.1  CL 105 102  CO2 26 27  GLUCOSE 166* 107*  BUN 25* 23  CREATININE 1.41* 1.30*  CALCIUM  8.3* 7.9*   Liver Function Tests: No results for input(s): AST, ALT, ALKPHOS, BILITOT, PROT, ALBUMIN  in the last 72 hours. Recent Labs    03/02/24 1520  LIPASE 43   CBC: Recent Labs    03/02/24 1520 03/04/24 0352  WBC 5.1 5.2  HGB 10.6* 10.1*  HCT 33.4* 30.1*  MCV 98.2 95.6  PLT 158 147*   Cardiac Enzymes: No results for input(s): CKTOTAL, CKMB, CKMBINDEX, TROPONINIHS in the last 72 hours. BNP: No results for input(s): BNP in the last 72 hours. D-Dimer: Recent Labs    03/02/24 2239  DDIMER 0.83*   Hemoglobin A1C: No results for input(s): HGBA1C in the last 72 hours. Fasting Lipid Panel: No results for input(s): CHOL, HDL, LDLCALC, TRIG, CHOLHDL, LDLDIRECT in the last 72 hours. Thyroid  Function Tests: No results for input(s): TSH, T4TOTAL, T3FREE, THYROIDAB in the last 72 hours.  Invalid input(s): FREET3 Anemia Panel: No results for input(s): VITAMINB12, FOLATE, FERRITIN, TIBC, IRON , RETICCTPCT in the last  72 hours.   Radiology: NM Myocar Multi W/Spect W/Wall Motion / EF Result Date: 03/03/2024   Findings are consistent with ischemia. The study is high risk.   No  ST deviation was noted.   LV perfusion is abnormal. There is evidence of ischemia. There is no evidence of infarction. Defect 1: There is a medium defect with moderate reduction in uptake present in the mid to basal inferolateral and lateral location(s) that is reversible. There is normal wall motion in the defect area. Consistent with ischemia.   Left ventricular function is normal. End diastolic cavity size is normal.   CT images were obtained for attenuation correction and were examined for the presence of coronary calcium  when appropriate. Conclusion Abnormal myocardial perfusion scan large area of lateral lateral basal evidence of ischemia moderate intensity Ejection fraction normal around 58% with normal wall motion Consider further evaluation possibly invasively to rule out significant obstructive disease   CT Angio Chest PE W/Cm &/Or Wo Cm Result Date: 03/03/2024 EXAM: CTA of the Chest with contrast for PE 03/03/2024 12:26:56 AM TECHNIQUE: CTA of the chest was performed without and with the administration of 75 mL of iohexol  (OMNIPAQUE ) 350 MG/ML injection. Multiplanar reformatted images are provided for review. MIP images are provided for review. Automated exposure control, iterative reconstruction, and/or weight based adjustment of the mA/kV was utilized to reduce the radiation dose to as low as reasonably achievable. COMPARISON: 07/27/2023 CLINICAL HISTORY: Pulmonary embolism (PE) suspected, low to intermediate prob, positive D-dimer. FINDINGS: PULMONARY ARTERIES: Pulmonary arteries are adequately opacified for evaluation. No pulmonary embolism. Main pulmonary artery is normal in caliber. MEDIASTINUM: Coronary artery and aortic atherosclerosis. LYMPH NODES: No mediastinal, hilar or axillary lymphadenopathy. LUNGS AND PLEURA: Scarring in the lingula and right middle lobe. No acute confluent airspace opacities. No pulmonary edema. No pleural effusion or pneumothorax. UPPER ABDOMEN: Limited images of the  upper abdomen are unremarkable. SOFT TISSUES AND BONES: No acute bone or soft tissue abnormality. IMPRESSION: 1. No pulmonary embolism. 2. No acute pulmonary abnormality. 3. Coronary artery disease. Aortic atherosclerosis. Electronically signed by: Franky Crease MD 03/03/2024 12:33 AM EST RP Workstation: [id number]   CT Head Wo Contrast Result Date: 03/02/2024 CLINICAL DATA:  Fall 2 weeks ago.  Episode of emesis today. EXAM: CT HEAD WITHOUT CONTRAST TECHNIQUE: Contiguous axial images were obtained from the base of the skull through the vertex without intravenous contrast. RADIATION DOSE REDUCTION: This exam was performed according to the departmental dose-optimization program which includes automated exposure control, adjustment of the mA and/or kV according to patient size and/or use of iterative reconstruction technique. COMPARISON:  09/19/2023, 07/21/2023 FINDINGS: Brain: Ventricles, cisterns and other CSF spaces are unchanged and within normal. There is minimal chronic ischemic microvascular disease present. No mass, mass effect, shift of midline structures or acute hemorrhage. Postsurgical/posttraumatic sequelae with thickening/calcification of the right frontal subdural region unchanged from 09/19/2023. This directly underlies the right frontal craniotomy defect. Vascular: No hyperdense vessel or unexpected calcification. Skull: Evidence of previous right frontal craniectomy. Sinuses/Orbits: Hypoplastic frontal sinuses as the paranasal sinuses are otherwise clear. Mastoid air cells are clear. Orbits are normal. Other: None. IMPRESSION: 1. No acute findings. 2. Minimal chronic ischemic microvascular disease. 3. Previous right frontal craniotomy with stable underlying postsurgical changes. Electronically Signed   By: Toribio Agreste M.D.   On: 03/02/2024 16:31   DG Chest 2 View Result Date: 03/02/2024 EXAM: 2 VIEW(S) XRAY OF THE CHEST 03/02/2024 03:51:00 PM COMPARISON: 08/31/2023 CLINICAL HISTORY: CP FINDINGS:  LUNGS AND PLEURA:  Low lung volumes. No focal pulmonary opacity. No pleural effusion. No pneumothorax. HEART AND MEDIASTINUM: Cardiomegaly. Tortuous thoracic aorta. Atherosclerotic calcifications. BONES AND SOFT TISSUES: Multilevel degenerative changes of thoracic spine. No acute osseous abnormality. IMPRESSION: 1. No acute findings. 2. Cardiomegaly. Electronically signed by: Greig Pique MD 03/02/2024 04:29 PM EST RP Workstation: [id number]    ECHO pending  TELEMETRY (personally reviewed): Sinus rhythm rate 50s  EKG (personally reviewed): Normal sinus rhythm rate 63 bpm  Data reviewed by me 03/04/2024: last 24h vitals tele labs imaging I/O ED provider note, admission H&P, hospitalist progress note  Principal Problem:   Precordial chest pain Active Problems:   Stage 3a chronic kidney disease (HCC)   Diabetes mellitus, type 2 (HCC)   Hypertension   Parkinson disease (HCC)   Trigeminal neuralgia   BPH (benign prostatic hyperplasia)   History of traumatic subdural hematoma s/p craniotomy   Chest pain   Thrombocytopenia    ASSESSMENT AND PLAN:  Jack White is a 84 y.o. male  with a past medical history of hypertension, type 2 diabetes, CKD stage IIIa, BPH, history of traumatic subdural hematoma s/p craniotomy who presented to the ED on 03/02/2024 for shortness of breath and chest pain. Cardiology was consulted for further evaluation.   # Chest pain # Hypertension # Chronic kidney disease stage IIIa Patient presented with complaints of shortness of breath and chest discomfort after an episode of vomiting yesterday.  Troponins are minimal at 23 and 24.  Chest x-ray without acute abnormality.  EKG without acute ischemic changes.  Overall feeling improved today. Nuclear stress 03/03/24 with large area of lateral basal evidence of ischemia moderate intensity. - Echo pending, further recommendations pending these results. - Discussed the risks and benefits of proceeding with LHC for further  evaluation with the patient.  He is agreeable to proceed.  NPO until LHC this afternoon (03/04/2024) with Dr. Florencio.  Written consent will be obtained.  Further recommendations following LHC.   - S/p ASA 325 in the ED.  Continue atorvastatin  10 mg daily and aspirin  81 mg daily. - Continue propranolol  20 mg twice daily.   This patient's plan of care was discussed and created with Dr. Florencio and he is in agreement.  Signed: Danita Bloch, PA-C  03/04/2024, 9:29 AM [facility] Cardiology      "

## 2024-03-04 NOTE — Plan of Care (Signed)
  Problem: Clinical Measurements: Goal: Ability to maintain clinical measurements within normal limits will improve Outcome: Progressing   Problem: Elimination: Goal: Will not experience complications related to bowel motility Outcome: Progressing   Problem: Safety: Goal: Ability to remain free from injury will improve Outcome: Progressing   

## 2024-03-04 NOTE — Progress Notes (Signed)
 Pt had an IV that was removed and there was a bruise about the size of a quarter at the site. When the nurse went back in about later, the bruise/bleeding had moved up to his elbow. Provider notified.

## 2024-03-04 NOTE — Progress Notes (Signed)
 Provided Diontae and daughter supportive presence while in recovery at [employer].  Updated staff in 260 of support needed when he returns to the floor.     03/04/24 2000  Spiritual Encounters  Type of Visit Initial  Care provided to: Pt and family  Conversation partners present during encounter Nurse  Referral source Family  Reason for visit Surgical  OnCall Visit Yes  Spiritual Framework  Presenting Themes Meaning/purpose/sources of inspiration;Values and beliefs  Patient Stress Factors Loss of control  Interventions  Spiritual Care Interventions Made Established relationship of care and support;Compassionate presence;Reflective listening;Narrative/life review;Encouragement

## 2024-03-04 NOTE — Evaluation (Signed)
 Speech Language Pathology Evaluation Patient Details Name: Jack White MRN: [id number] DOB: Aug 15, 1940 Today's Date: 03/04/2024 Time: 1250-1330 SLP Time Calculation (min) (ACUTE ONLY): 40 min  Problem List:  Patient Active Problem List   Diagnosis Date Noted   Thrombocytopenia 03/04/2024   Precordial chest pain 03/03/2024   History of traumatic subdural hematoma s/p craniotomy 03/03/2024   Chest pain 03/03/2024   CAP (community acquired pneumonia) 08/31/2023   Hypotension 08/31/2023   Generalized weakness 08/31/2023   Parkinson's disease (HCC) 08/31/2023   Type 2 diabetes mellitus with chronic kidney disease, without long-term current use of insulin  (HCC) 08/31/2023   Dyslipidemia 08/31/2023   Acute renal failure superimposed on stage 3a chronic kidney disease (HCC) 08/13/2023   Hyperkalemia 08/13/2023   Aspiration pneumonia (HCC) 08/13/2023   Anemia 08/13/2023   HLD (hyperlipidemia) 08/13/2023   Parkinson disease (HCC) 08/13/2023   Trigeminal neuralgia 08/13/2023   BPH (benign prostatic hyperplasia) 08/13/2023   Insomnia 08/13/2023   Moderate malnutrition 07/23/2023   Acute gastroenteritis 04/07/2023   Intractable nausea and vomiting 04/07/2023   Colostomy in place [facility]) 11/02/2014   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   S/P colostomy (HCC)    Acute cholecystitis 10/14/2014   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   Colon perforation (HCC) 10/14/2014   H/O colostomy 10/14/2014   Diabetes mellitus, type 2 (HCC) 10/14/2014   Hypertension 10/14/2014   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   [name]'s neuralgia 10/14/2014   Gangrenous cholecystitis 06/27/2014   Stage 3a chronic kidney disease (HCC) 02/15/2014   Fungal infection of toenail 07/30/2013   Past Medical History:  Past Medical History:  Diagnosis Date   Acute cholecystitis 05/21/2014   Arthritis    BP (high blood pressure) 10/14/2014   Cancer (HCC)    skin   Chronic kidney disease (CKD), stage III  (moderate) (HCC) 02/15/2014   Colon perforation (HCC) 05/28/2014   Diabetes mellitus without complication (HCC)    [name]'s neuralgia 10/14/2014   Headache    Hypertension    Intractable nausea and vomiting 04/07/2023   MI, old    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   Subdural hematoma (HCC) 2025   Past Surgical History:  Past Surgical History:  Procedure Laterality Date   COLOSTOMY  05/28/2014   Dr. Lucien   COLOSTOMY TAKEDOWN N/A 10/26/2014   Procedure: COLOSTOMY TAKEDOWN;  Surgeon: Elgin Laurence III, MD;  Location: [facility] ORS;  Service: General;  Laterality: N/A;   CRANIOTOMY Right 07/21/2023   Procedure: CRANIOTOMY HEMATOMA EVACUATION SUBDURAL;  Surgeon: Lanis Pupa, MD;  Location: [facility] OR;  Service: Neurosurgery;  Laterality: Right;   EXPLORATORY LAPAROTOMY  05/28/2014   Dr. Lucien   LAPAROSCOPIC CHOLECYSTECTOMY  05/21/2014   Dr. Laurence   HPI:  Per MD Progress Note, Jack White is a 84 y.o. 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  Patient has elevated D-dimer, CT angiogram was negative for PE.  Patient also had a stress nuclear stress test, was positive for ischemia.  Scheduled for heart cath today CT Head, No acute findings.  2. Minimal chronic ischemic microvascular disease.  3. Previous right frontal craniotomy with stable underlying  postsurgical changes.   Assessment / Plan / Recommendation Clinical Impression  Pt seen for cognitive linguistic evaluation in the setting of concern for slurred speech/change in mentation (per family). 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 SLP intervention at that time targeted alertness, attention, awareness, executive function, etc. Additional SLP services were recommended at discharge at  that time. Pt some residual cognitive communication impairments, though daughter reports continued improvement. Neuro office visit on 01/13/24, We did talk about traumatic brain injury and its effect on people's cognition, sensorium, emotionality, memory...not uncommon for people to have symptoms even into a year or more, and given his age and overall level of health I would not be surprised if he had ongoing issues with his brain injury for a continued/extended period of time.  Assessment consisting of pt/family interview and completion of dynamic assessment. Daughter reporting that speech intelligibility seemed altered earlier this date, suspected related to lack of sleep. Pt initially asleep during discussion with daughter, awakening with presence of RN in room, and demonstrating simple functional recall, attention, and clear motor speech production. Pt's daughter indicated that pt is approximating cognitive baseline. Education shared regarding importance of routine, sleep, familiar environment/companions, and managing distractions during hospital course. Daughter aware and reported understanding. Recommendation shared regarding following up with SLP eval at next level of care if pt/family has any lingering concerns for change in cognition. Daughter reported agreement with plan. MD and RN aware of recommendations. No further acute SLP services indicated.      SLP Assessment  SLP Recommendation/Assessment: All further Speech Language Pathology needs can be addressed in the next venue of care SLP Visit Diagnosis: Cognitive communication deficit (R41.841)     Assistance Recommended at Discharge  Intermittent Supervision/Assistance  Functional Status Assessment Patient has not had a recent decline in their functional status        SLP Evaluation Cognition  Overall Cognitive Status: History of cognitive impairments - at baseline Arousal/Alertness: Lethargic Orientation Level: Oriented  X4 Attention: Sustained Sustained Attention: Appears intact (with limitation of lethargy) Memory: Appears intact (regarding recall of breakfast consumed) Awareness:  (not directly assessed) Problem Solving:  (not directly assessed) Behaviors:  (lethargic)       Comprehension  Auditory Comprehension Overall Auditory Comprehension: Appears within functional limits for tasks assessed Reading Comprehension Reading Status: Not tested    Expression Expression Primary Mode of Expression: Verbal Verbal Expression Overall Verbal Expression: Appears within functional limits for tasks assessed Written Expression Written Expression: Not tested   Oral / Motor  Oral Motor/Sensory Function Overall Oral Motor/Sensory Function: Within functional limits Motor Speech Overall Motor Speech: Appears within functional limits for tasks assessed           Laquincy Eastridge Clapp, MS, CCC-SLP Speech Language Pathologist Rehab Services; [facility] [phone] (ascom)   [place] Gotwalt J Clapp 03/04/2024, 2:27 PM

## 2024-03-05 ENCOUNTER — Other Ambulatory Visit: Payer: Self-pay

## 2024-03-05 DIAGNOSIS — I200 Unstable angina: Secondary | ICD-10-CM | POA: Insufficient documentation

## 2024-03-05 DIAGNOSIS — I9589 Other hypotension: Secondary | ICD-10-CM

## 2024-03-05 DIAGNOSIS — N1831 Chronic kidney disease, stage 3a: Secondary | ICD-10-CM

## 2024-03-05 DIAGNOSIS — E875 Hyperkalemia: Secondary | ICD-10-CM

## 2024-03-05 LAB — BASIC METABOLIC PANEL WITH GFR
Anion gap: 6 (ref 5–15)
Anion gap: 9 (ref 5–15)
BUN: 41 mg/dL — ABNORMAL HIGH (ref 8–23)
BUN: 54 mg/dL — ABNORMAL HIGH (ref 8–23)
CO2: 26 mmol/L (ref 22–32)
CO2: 27 mmol/L (ref 22–32)
Calcium: 7.7 mg/dL — ABNORMAL LOW (ref 8.9–10.3)
Calcium: 8.1 mg/dL — ABNORMAL LOW (ref 8.9–10.3)
Chloride: 102 mmol/L (ref 98–111)
Chloride: 101 mmol/L (ref 98–111)
Creatinine, Ser: 1.58 mg/dL — ABNORMAL HIGH (ref 0.61–1.24)
Creatinine, Ser: 1.76 mg/dL — ABNORMAL HIGH (ref 0.61–1.24)
GFR, Estimated: 43 mL/min — ABNORMAL LOW
GFR, Estimated: 37 mL/min — ABNORMAL LOW
Glucose, Bld: 150 mg/dL — ABNORMAL HIGH (ref 70–99)
Glucose, Bld: 169 mg/dL — ABNORMAL HIGH (ref 70–99)
Potassium: 5.8 mmol/L — ABNORMAL HIGH (ref 3.5–5.1)
Potassium: 5.1 mmol/L (ref 3.5–5.1)
Sodium: 134 mmol/L — ABNORMAL LOW (ref 135–145)
Sodium: 136 mmol/L (ref 135–145)

## 2024-03-05 LAB — ECHOCARDIOGRAM COMPLETE
AR max vel: 1.91 cm2
AV Area VTI: 1.86 cm2
AV Area mean vel: 1.83 cm2
AV Mean grad: 4 mmHg
AV Peak grad: 6.9 mmHg
Ao pk vel: 1.32 m/s
Area-P 1/2: 1.85 cm2
MV VTI: 1.87 cm2
S' Lateral: 3 cm
Weight: 3283.97 [oz_av]

## 2024-03-05 LAB — CBC
HCT: 27 % — ABNORMAL LOW (ref 39.0–52.0)
Hemoglobin: 9.1 g/dL — ABNORMAL LOW (ref 13.0–17.0)
MCH: 32 pg (ref 26.0–34.0)
MCHC: 33.7 g/dL (ref 30.0–36.0)
MCV: 95.1 fL (ref 80.0–100.0)
Platelets: 171 K/uL (ref 150–400)
RBC: 2.84 MIL/uL — ABNORMAL LOW (ref 4.22–5.81)
RDW: 12.5 % (ref 11.5–15.5)
WBC: 6.2 K/uL (ref 4.0–10.5)
nRBC: 0 % (ref 0.0–0.2)

## 2024-03-05 MED ORDER — SODIUM ZIRCONIUM CYCLOSILICATE 10 G PO PACK
10.0000 g | PACK | Freq: Once | ORAL | Status: AC
  Administered 2024-03-05: 10 g via ORAL
  Filled 2024-03-05: qty 1

## 2024-03-05 MED ORDER — SODIUM CHLORIDE 0.9 % IV SOLN: INTRAVENOUS | Status: DC

## 2024-03-05 MED ORDER — MIDODRINE HCL 5 MG PO TABS
5.0000 mg | ORAL_TABLET | Freq: Three times a day (TID) | ORAL | Status: DC
  Administered 2024-03-05: 5 mg via ORAL
  Filled 2024-03-05: qty 1

## 2024-03-05 MED ORDER — SODIUM BICARBONATE 8.4 % IV SOLN
50.0000 meq | Freq: Once | INTRAVENOUS | Status: AC
  Administered 2024-03-05: 50 meq via INTRAVENOUS
  Filled 2024-03-05: qty 50

## 2024-03-05 MED ORDER — LACTATED RINGERS IV BOLUS
500.0000 mL | Freq: Once | INTRAVENOUS | Status: AC
  Administered 2024-03-05: 500 mL via INTRAVENOUS

## 2024-03-05 MED ORDER — LACTULOSE 10 GM/15ML PO SOLN
20.0000 g | Freq: Once | ORAL | Status: AC
  Administered 2024-03-05: 20 g via ORAL
  Filled 2024-03-05: qty 30

## 2024-03-05 MED ORDER — CALCIUM GLUCONATE-NACL 1-0.675 GM/50ML-% IV SOLN
1.0000 g | Freq: Once | INTRAVENOUS | Status: AC
  Administered 2024-03-05: 1000 mg via INTRAVENOUS
  Filled 2024-03-05: qty 50

## 2024-03-05 NOTE — Progress Notes (Signed)
 PT Cancellation Note  Patient Details Name: KAELUM KISSICK MRN: [id number] DOB: 10/16/1940   Cancelled Treatment:    Reason Eval/Treat Not Completed: Other (comment) Cardiology recommending holding PT until tomorrow. Will f/u as able.  Richerd Pinal, PT, DPT 03/05/2024, 11:24 AM   Richerd CHRISTELLA Pinal 03/05/2024, 11:24 AM

## 2024-03-05 NOTE — Progress Notes (Signed)
 " [facility] CLINIC CARDIOLOGY PROGRESS NOTE       Patient ID: Jack White MRN: [id number] DOB/AGE: 1941/02/18 84 y.o.  Admit date: 03/02/2024 Referring Physician Dr. Delayne Solian Primary Physician Diedra Lame, MD  Primary Cardiologist none Reason for Consultation chest pain  HPI: Jack White is a 84 y.o. male  with a past medical history of hypertension, type 2 diabetes, CKD stage IIIa, BPH, history of traumatic subdural hematoma s/p craniotomy who presented to the ED on 03/02/2024 for shortness of breath and chest pain. Cardiology was consulted for further evaluation.   Interval history: -Patient seen and examined this AM, resting in hospital bed.  -Reports feeling well this AM with no recurrence of CP.  - Bruise noted to left arm which was secondary to issue with IV post cath yesterday.  He did also have bleeding issues from his radial site with delayed band removal but overall this looks great today. - BP has been borderline low.  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 Lucien   COLOSTOMY TAKEDOWN N/A 10/26/2014   Procedure: COLOSTOMY TAKEDOWN;  Surgeon: Elgin Laurence III, MD;  Location: [facility] ORS;  Service: General;  Laterality: N/A;   CRANIOTOMY Right 07/21/2023   Procedure: CRANIOTOMY HEMATOMA EVACUATION SUBDURAL;  Surgeon: Lanis Pupa, MD;  Location: [facility] OR;   Service: Neurosurgery;  Laterality: Right;   EXPLORATORY LAPAROTOMY  05/28/2014   Dr. Lucien   LAPAROSCOPIC CHOLECYSTECTOMY  05/21/2014   Dr. Laurence    Medications Prior to Admission  Medication Sig Dispense Refill Last Dose/Taking   acetaminophen  (TYLENOL ) 500 MG tablet Take 500-1,000 mg by mouth 2 (two) times daily.   Unknown   atorvastatin  (LIPITOR) 10 MG tablet Take 1 tablet (10 mg total) by mouth at bedtime.   03/01/2024 Bedtime   butalbital -aspirin -caffeine  (FIORINAL) 50-325-40 MG capsule TAKE 1 CAPSULE BY MOUTH EVERY 6 HOURS AS NEEDED FOR HEADACHE   Unknown   carbamazepine  (TEGRETOL ) 200 MG tablet Take 1 tablet (200 mg total) by mouth 3 (three) times daily.   03/02/2024 Morning   diphenhydrAMINE  (BENADRYL ) 25 MG tablet Take 2 tablets (50 mg total) by mouth at bedtime.   03/01/2024 Bedtime   doxazosin  (CARDURA ) 4 MG tablet Take 4 mg by mouth daily.   03/02/2024 Morning   DULoxetine  (CYMBALTA ) 60 MG capsule Take 60 mg by mouth daily.   03/02/2024 Morning   ergocalciferol  (VITAMIN D2) 1.25 MG (50000 UT) capsule Take 50,000 Units by mouth once a week.   Past Week   ferrous sulfate  325 (65 FE) MG tablet Take 1 tablet (325 mg total) by mouth daily with breakfast.   03/02/2024 Morning   fluticasone  (FLONASE ) 50 MCG/ACT nasal spray Place 2 sprays into both nostrils daily as needed for allergies.   Unknown   folic acid  (FOLVITE ) 1 MG tablet Take  1 tablet (1 mg total) by mouth daily.   03/02/2024 Morning   gabapentin  (NEURONTIN ) 300 MG capsule Take 300 mg by mouth at bedtime.   03/01/2024 Bedtime   guaiFENesin -dextromethorphan (ROBITUSSIN DM) 100-10 MG/5ML syrup Take 5 mLs by mouth every 4 (four) hours as needed for cough. 118 mL 0 Unknown   hydrocortisone  cream 1 % Apply 1 Application topically 3 (three) times daily as needed for itching.   Unknown   hydroxypropyl methylcellulose / hypromellose (ISOPTO TEARS / GONIOVISC) 2.5 % ophthalmic solution Place 2 drops into both eyes 4 (four) times daily as needed for dry  eyes.   Unknown   ipratropium-albuterol  (DUONEB) 0.5-2.5 (3) MG/3ML SOLN Take 3 mLs by nebulization every 6 (six) hours as needed (wheezing).   Unknown   melatonin 10 MG TABS Take 10 mg by mouth at bedtime.   03/01/2024 Bedtime   metFORMIN  (GLUCOPHAGE -XR) 500 MG 24 hr tablet Take 500 mg by mouth daily.   03/02/2024 Morning   modafinil  (PROVIGIL ) 100 MG tablet Take 1 tablet (100 mg total) by mouth daily. 10 tablet 0 03/02/2024 Morning   montelukast  (SINGULAIR ) 10 MG tablet Take 10 mg by mouth at bedtime.   03/01/2024 Bedtime   omeprazole (PRILOSEC) 20 MG capsule Take 20 mg by mouth daily.   03/02/2024 Morning   polyethylene glycol (MIRALAX  / GLYCOLAX ) 17 g packet Take 17 g by mouth daily as needed.   Unknown   propranolol  (INDERAL ) 20 MG tablet Take 20 mg by mouth 2 (two) times daily.   03/02/2024 Morning   sennosides (SENOKOT) 8.8 MG/5ML syrup Take 5 mLs by mouth at bedtime.   03/01/2024 Bedtime   [EXPIRED] tamsulosin  (FLOMAX ) 0.4 MG CAPS capsule Take 0.4 mg by mouth daily after breakfast.   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  Tobacco Use   Smoking status: Never   Smokeless tobacco: Never  Vaping Use   Vaping status: Never Used  Substance and Sexual Activity   Alcohol  use: No    Alcohol /week: 0.0 standard drinks of alcohol    Drug use: No   Sexual activity: Not on file  Other Topics Concern   Not on file  Social History Narrative   Not on file   Social Drivers of Health   Tobacco Use: Low Risk (12/09/2023)   Patient History    Smoking Tobacco Use: Never    Smokeless Tobacco Use: Never    Passive  Exposure: Not on file  Financial Resource Strain: High Risk (10/07/2023)   Received from [facility]   Overall Financial Resource Strain (CARDIA)    How hard is it for you to pay for the very basics like food, housing, medical care, and heating?: Very hard  Food Insecurity: No Food Insecurity (03/03/2024)   Epic    Worried About Radiation Protection Practitioner of Food in the Last Year: Never true    Ran Out of Food in the Last Year: Never true  Transportation Needs: No Transportation Needs (03/03/2024)   Epic    Lack of Transportation (Medical): No  Lack of Transportation (Non-Medical): No  Physical Activity: Not on file  Stress: Not on file  Social Connections: Moderately Integrated (03/03/2024)   Social Connection and Isolation Panel    Frequency of Communication with Friends and Family: Three times a week    Frequency of Social Gatherings with Friends and Family: Three times a week    Attends Religious Services: More than 4 times per year    Active Member of Clubs or Organizations: Yes    Attends Banker Meetings: 1 to 4 times per year    Marital Status: Widowed  Intimate Partner Violence: Not At Risk (03/03/2024)   Epic    Fear of Current or Ex-Partner: No    Emotionally Abused: No    Physically Abused: No    Sexually Abused: No  Depression (PHQ2-9): Not on file  Alcohol  Screen: Not on file  Housing: Low Risk (03/03/2024)   Epic    Unable to Pay for Housing in the Last Year: No    Number of Times Moved in the Last Year: 0    Homeless in the Last Year: No  Utilities: Not At Risk (03/03/2024)   Epic    Threatened with loss of utilities: No  Health Literacy: Not on file    Family History  Problem Relation Age of Onset   Diabetes Mother    Hypertension Mother    Alzheimer's disease Mother    Heart disease Father    Diabetes Sister      Vitals:   03/05/24 0023 03/05/24 0442 03/05/24 0912 03/05/24 1129  BP: 90/62 (!) 95/56 108/61 (!) 87/43  Pulse: 80 70 76 67  Resp: 20 20 19 19    Temp: 97.8 F (36.6 C) 98 F (36.7 C) 98.9 F (37.2 C) 98.2 F (36.8 C)  TempSrc:      SpO2: 94% 94% 94% 92%  Weight:        PHYSICAL EXAM General: Chronically ill-appearing elderly male, well nourished, in no acute distress. HEENT: Normocephalic and atraumatic. Neck: No JVD.  Lungs: Normal respiratory effort on room air. Clear bilaterally to auscultation. No wheezes, crackles, rhonchi.  Heart: HRRR. Normal S1 and S2 without gallops or murmurs.  Abdomen: Non-distended appearing.  Msk: Normal strength and tone for age. Extremities: Warm and well perfused. No clubbing, cyanosis.  No edema.  Neuro: Alert and oriented X 3. Psych: Answers questions appropriately.   Labs: Basic Metabolic Panel: Recent Labs    03/04/24 0352 03/05/24 0412  NA 136 134*  K 5.1 5.8*  CL 102 102  CO2 27 26  GLUCOSE 107* 150*  BUN 23 41*  CREATININE 1.30* 1.58*  CALCIUM  7.9* 7.7*   Liver Function Tests: No results for input(s): AST, ALT, ALKPHOS, BILITOT, PROT, ALBUMIN  in the last 72 hours. Recent Labs    03/02/24 1520  LIPASE 43   CBC: Recent Labs    03/04/24 0352 03/05/24 0412  WBC 5.2 6.2  HGB 10.1* 9.1*  HCT 30.1* 27.0*  MCV 95.6 95.1  PLT 147* 171   Cardiac Enzymes: No results for input(s): CKTOTAL, CKMB, CKMBINDEX, TROPONINIHS in the last 72 hours. BNP: No results for input(s): BNP in the last 72 hours. D-Dimer: Recent Labs    03/02/24 2239  DDIMER 0.83*   Hemoglobin A1C: No results for input(s): HGBA1C in the last 72 hours. Fasting Lipid Panel: No results for input(s): CHOL, HDL, LDLCALC, TRIG, CHOLHDL, LDLDIRECT in the last 72 hours. Thyroid  Function Tests: No results for input(s): TSH, T4TOTAL, T3FREE,  THYROIDAB in the last 72 hours.  Invalid input(s): FREET3 Anemia Panel: No results for input(s): VITAMINB12, FOLATE, FERRITIN, TIBC, IRON , RETICCTPCT in the last 72 hours.   Radiology: NM Myocar Multi  W/Spect W/Wall Motion / EF Result Date: 03/03/2024   Findings are consistent with ischemia. The study is high risk.   No ST deviation was noted.   LV perfusion is abnormal. There is evidence of ischemia. There is no evidence of infarction. Defect 1: There is a medium defect with moderate reduction in uptake present in the mid to basal inferolateral and lateral location(s) that is reversible. There is normal wall motion in the defect area. Consistent with ischemia.   Left ventricular function is normal. End diastolic cavity size is normal.   CT images were obtained for attenuation correction and were examined for the presence of coronary calcium  when appropriate. Conclusion Abnormal myocardial perfusion scan large area of lateral lateral basal evidence of ischemia moderate intensity Ejection fraction normal around 58% with normal wall motion Consider further evaluation possibly invasively to rule out significant obstructive disease   CT Angio Chest PE W/Cm &/Or Wo Cm Result Date: 03/03/2024 EXAM: CTA of the Chest with contrast for PE 03/03/2024 12:26:56 AM TECHNIQUE: CTA of the chest was performed without and with the administration of 75 mL of iohexol  (OMNIPAQUE ) 350 MG/ML injection. Multiplanar reformatted images are provided for review. MIP images are provided for review. Automated exposure control, iterative reconstruction, and/or weight based adjustment of the mA/kV was utilized to reduce the radiation dose to as low as reasonably achievable. COMPARISON: 07/27/2023 CLINICAL HISTORY: Pulmonary embolism (PE) suspected, low to intermediate prob, positive D-dimer. FINDINGS: PULMONARY ARTERIES: Pulmonary arteries are adequately opacified for evaluation. No pulmonary embolism. Main pulmonary artery is normal in caliber. MEDIASTINUM: Coronary artery and aortic atherosclerosis. LYMPH NODES: No mediastinal, hilar or axillary lymphadenopathy. LUNGS AND PLEURA: Scarring in the lingula and right middle lobe. No acute  confluent airspace opacities. No pulmonary edema. No pleural effusion or pneumothorax. UPPER ABDOMEN: Limited images of the upper abdomen are unremarkable. SOFT TISSUES AND BONES: No acute bone or soft tissue abnormality. IMPRESSION: 1. No pulmonary embolism. 2. No acute pulmonary abnormality. 3. Coronary artery disease. Aortic atherosclerosis. Electronically signed by: Franky Crease MD 03/03/2024 12:33 AM EST RP Workstation: [id number]   CT Head Wo Contrast Result Date: 03/02/2024 CLINICAL DATA:  Fall 2 weeks ago.  Episode of emesis today. EXAM: CT HEAD WITHOUT CONTRAST TECHNIQUE: Contiguous axial images were obtained from the base of the skull through the vertex without intravenous contrast. RADIATION DOSE REDUCTION: This exam was performed according to the departmental dose-optimization program which includes automated exposure control, adjustment of the mA and/or kV according to patient size and/or use of iterative reconstruction technique. COMPARISON:  09/19/2023, 07/21/2023 FINDINGS: Brain: Ventricles, cisterns and other CSF spaces are unchanged and within normal. There is minimal chronic ischemic microvascular disease present. No mass, mass effect, shift of midline structures or acute hemorrhage. Postsurgical/posttraumatic sequelae with thickening/calcification of the right frontal subdural region unchanged from 09/19/2023. This directly underlies the right frontal craniotomy defect. Vascular: No hyperdense vessel or unexpected calcification. Skull: Evidence of previous right frontal craniectomy. Sinuses/Orbits: Hypoplastic frontal sinuses as the paranasal sinuses are otherwise clear. Mastoid air cells are clear. Orbits are normal. Other: None. IMPRESSION: 1. No acute findings. 2. Minimal chronic ischemic microvascular disease. 3. Previous right frontal craniotomy with stable underlying postsurgical changes. Electronically Signed   By: Toribio Agreste M.D.   On: 03/02/2024 16:31   DG Chest  2 View Result  Date: 03/02/2024 EXAM: 2 VIEW(S) XRAY OF THE CHEST 03/02/2024 03:51:00 PM COMPARISON: 08/31/2023 CLINICAL HISTORY: CP FINDINGS: LUNGS AND PLEURA: Low lung volumes. No focal pulmonary opacity. No pleural effusion. No pneumothorax. HEART AND MEDIASTINUM: Cardiomegaly. Tortuous thoracic aorta. Atherosclerotic calcifications. BONES AND SOFT TISSUES: Multilevel degenerative changes of thoracic spine. No acute osseous abnormality. IMPRESSION: 1. No acute findings. 2. Cardiomegaly. Electronically signed by: Greig Pique MD 03/02/2024 04:29 PM EST RP Workstation: [id number]    ECHO pending  TELEMETRY (personally reviewed): Sinus rhythm rate 70s  EKG (personally reviewed): Normal sinus rhythm rate 63 bpm  Data reviewed by me 03/05/2024: last 24h vitals tele labs imaging I/O ED provider note, admission H&P, hospitalist progress note  Principal Problem:   Precordial chest pain Active Problems:   Stage 3a chronic kidney disease (HCC)   Diabetes mellitus, type 2 (HCC)   Hypertension   Hyperkalemia   Parkinson disease (HCC)   Trigeminal neuralgia   BPH (benign prostatic hyperplasia)   History of traumatic subdural hematoma s/p craniotomy   Chest pain   Thrombocytopenia   Unstable angina (HCC)    ASSESSMENT AND PLAN:  Jack White is a 84 y.o. male  with a past medical history of hypertension, type 2 diabetes, CKD stage IIIa, BPH, history of traumatic subdural hematoma s/p craniotomy who presented to the ED on 03/02/2024 for shortness of breath and chest pain. Cardiology was consulted for further evaluation.   # Coronary artery disease s/p DES # Hypertension # Chronic kidney disease stage IIIa Patient presented with complaints of shortness of breath and chest discomfort after an episode of vomiting yesterday.  Troponins are minimal at 23 and 24.  Chest x-ray without acute abnormality.  EKG without acute ischemic changes.  Overall feeling improved today. Nuclear stress 03/03/24 with large area of  lateral basal evidence of ischemia moderate intensity. - Echo pending, further recommendations pending these results. - S/p ASA 325 in the ED.  Continue atorvastatin  10 mg daily, aspirin  81 mg daily, Plavix  75 mg daily for DAPT. - Continue propranolol  20 mg twice daily.  Could consider decreasing dose pending BP. - Plan to keep patient for 1 additional day to continue monitoring BP, hemoglobin and with plans for patient to work with PT tomorrow once he is outside of his 24-hour window post-cath to be able to use his right wrist.   This patient's plan of care was discussed and created with Dr. Florencio and he is in agreement.  Signed: Danita Bloch, PA-C  03/05/2024, 12:12 PM [facility] Cardiology      "

## 2024-03-05 NOTE — Plan of Care (Signed)

## 2024-03-05 NOTE — Consult Note (Signed)
 WOC Nurse Consult Note: Reason for Consult: scrotal skin tear  Wound type: partial thickness posterior scrotum red moist  Pressure Injury POA: NA not pressure  Measurement: see nursing flowsheet  Wound bed:red moist  Drainage (amount, consistency, odor) serosanguinous  Periwound: intact  Dressing procedure/placement/frequency: Cleanse scrotal wound with NS, apply Xeroform gauze Soila [phone]) to wound bed daily and secure with ABD pad and mesh underwear or silicone foam whichever works better.   POC discussed with bedside nurse. WOC team will not follow. Reconsult if further needs arise.   Thank you,    Powell Bar MSN, RN-BC, [employer]

## 2024-03-05 NOTE — Progress Notes (Addendum)
" °  Progress Note   Patient: Jack White FMW:[id number] DOB: [birth year] DOA: 03/02/2024     1 DOS: the patient was seen and examined on 03/05/2024   Brief hospital course: Jack White is a 84 y.o. 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  Patient has elevated D-dimer, CT angiogram was negative for PE. Patient also had a stress nuclear stress test, was positive for ischemia.  Patient had a heart cath performed on 1/8, placed to LAD drug-eluting stent.   Principal Problem:   Precordial chest pain Active Problems:   Stage 3a chronic kidney disease (HCC)   Diabetes mellitus, type 2 (HCC)   Hypertension   Parkinson disease (HCC)   Trigeminal neuralgia   BPH (benign prostatic hyperplasia)   History of traumatic subdural hematoma s/p craniotomy   Chest pain   Thrombocytopenia   Assessment and Plan: Unstable angina with coronary disease Minimal elevation troponin. Negative for PE, positive nuclear stress test.   Heart cath showed 95% stenosis in the LAD.  Status post drug-eluting stent, will continue dual antiplatelet treatment. Patient had some bleeding from catheter site overnight, will monitor hemoglobin.  Stage 3a chronic kidney disease (HCC) Hyperkalemia Patient developed significant hyperkalemia, received Lokelma , sodium bicarb, calcium .  Monitor potassium.   History of traumatic subdural hematoma s/p craniotomy No acute issues   BPH (benign prostatic hyperplasia) Continue tamsulosin    Parkinson disease (HCC) Trigeminal neuralgia Continue gabapentin  and carbamazepine    Hypertension Continue home meds   Diabetes mellitus, type 2 (HCC) Sliding scale insulin  coverage    Addendum: 14: 25. repeat k has normalized, patient had hypotension, gave IVF bolus, continue IVF. Dc beta blocker.  Also started midodrine .      Subjective:  Patient doing well today, no additional chest pain or shortness of breath  Physical Exam: Vitals:   03/04/24 2218 03/05/24 0023 03/05/24 0442 03/05/24 0912  BP: 109/66 90/62 (!) 95/56 108/61  Pulse: 78 80 70 76  Resp: 19 20 20 19   Temp: 97.8 F (36.6 C) 97.8 F (36.6 C) 98 F (36.7 C) 98.9 F (37.2 C)  TempSrc:      SpO2: 97% 94% 94% 94%  Weight:       General exam: Appears calm and comfortable  Respiratory system: Clear to auscultation. Respiratory effort normal. Cardiovascular system: S1 & S2 heard, RRR. No JVD, murmurs, rubs, gallops or clicks. No pedal edema. Gastrointestinal system: Abdomen is nondistended, soft and nontender. No organomegaly or masses felt. Normal bowel sounds heard. Central nervous system: Alert and oriented. No focal neurological deficits. Extremities: Symmetric 5 x 5 power. Skin: No rashes, lesions or ulcers Psychiatry: Judgement and insight appear normal. Mood & affect appropriate.    Data Reviewed:  Lab results reviewed.  Family Communication: None  Disposition: Status is: Inpatient Patient still meet inpatient criteria with the severity of disease, postop.  IV treatment     Time spent: 50 minutes  Author: Murvin Mana, MD 03/05/2024 11:26 AM  For on call review [email or web address].    "

## 2024-03-05 NOTE — Progress Notes (Signed)
 Dr. Laurita notified re: low bp. Orders received for bolus. Pt asymptomatic pre-bolus.

## 2024-03-05 NOTE — Evaluation (Addendum)
 Physical Therapy Evaluation Patient Details Name: Jack White MRN: [id number] DOB: [birth year] Today's Date: 03/05/2024  History of Present Illness  Pt is an 84 y/o M admitted on 03/02/24 after presenting with c/o chest pain & vomiting. Pt with positive nuclear stress test, s/p heart cath with PCI & stent 03/04/24. PMH: HTN, DM, CKD 3A, possible Parkinson's & trigeminal neuralgia, BPH, craniotomy 2/2 traumatic SDH  Clinical Impression  PT on unit, daughter up to nursing station requesting assistance to help pt with urgent bathroom needs. Explained RUE precautions s/p cardiac cath & maintained restrictions to the best of the pt's ability.  Pt requires MAX assist for bed mobility, MAX assist +2 for stand pivot bed<>BSC with HHA or RW (max cuing to rest R hand on RW for balance only). Pt with poor ability to upright trunk & shift pelvis anteriorly for improved upright posture, significant posterior lean, unable to step to step pivot & requires +2 for safe transfer. Recommend ongoing PT services to progress mobility as able & post acute rehab <3 hours therapy/day upon d/c.  Nurse, attending MD, & cardiology team made aware of events of session.      If plan is discharge home, recommend the following: Two people to help with walking and/or transfers;Two people to help with bathing/dressing/bathroom   Can travel by private vehicle   No    Equipment Recommendations None recommended by PT (defer to next venue)  Recommendations for Other Services  OT consult    Functional Status Assessment Patient has had a recent decline in their functional status and demonstrates the ability to make significant improvements in function in a reasonable and predictable amount of time.     Precautions / Restrictions Precautions Precautions: Fall Restrictions Weight Bearing Restrictions Per Provider Order: No Other Position/Activity Restrictions: recent heart cath RUE      Mobility  Bed Mobility Overal bed  mobility: Needs Assistance Bed Mobility: Supine to Sit, Sit to Supine, Rolling Rolling: Mod assist, Used rails   Supine to sit: Max assist, HOB elevated, Used rails (exit R side of bed) Sit to supine: Max assist, HOB elevated, Used rails        Transfers Overall transfer level: Needs assistance Equipment used: 2 person hand held assist, Rolling walker (2 wheels) Transfers: Sit to/from Stand, Bed to chair/wheelchair/BSC Sit to Stand: Max assist, +2 physical assistance, +2 safety/equipment, From elevated surface Stand pivot transfers: Max assist, +2 physical assistance, +2 safety/equipment, From elevated surface         General transfer comment: cuing re: hand placement to push to standing with LUE only, MAX assist for pivot bed<>BSC on L    Ambulation/Gait                  Stairs            Wheelchair Mobility     Tilt Bed    Modified Rankin (Stroke Patients Only)       Balance Overall balance assessment: Needs assistance Sitting-balance support: Feet supported Sitting balance-Leahy Scale: Fair     Standing balance support: During functional activity, Bilateral upper extremity supported, Reliant on assistive device for balance Standing balance-Leahy Scale: Zero                               Pertinent Vitals/Pain Pain Assessment Pain Assessment: No/denies pain    Home Living Family/patient expects to be discharged to:: Private residence Living Arrangements: Other relatives (  step daughter) Available Help at Discharge: Family;Available PRN/intermittently Type of Home: Mobile home Home Access: Ramped entrance       Home Layout: One level Home Equipment: Agricultural Consultant (2 wheels);Shower seat;Wheelchair - manual;Lift chair      Prior Function               Mobility Comments: PRN assistance to get OOB, PRN assistance for transfers, ambulatory with RW, 1 fall in the past 6 months, PRN assistance for gait with RW, uses w/c ~1  day/week ADLs Comments: uses urinal to pee, sponge bathes 1x/week & gets in the shower 1x/week, assistance with bathing, dressing, & going to the bathroom     Extremity/Trunk Assessment   Upper Extremity Assessment Upper Extremity Assessment:  (significant bruising noted to L arm)    Lower Extremity Assessment Lower Extremity Assessment: Generalized weakness (hx of BLE peripheral neuropathy)       Communication   Communication Communication: Impaired Factors Affecting Communication: Hearing impaired    Cognition Arousal: Alert Behavior During Therapy: WFL for tasks assessed/performed   PT - Cognitive impairments: Safety/Judgement, Problem solving, Awareness                         Following commands: Impaired Following commands impaired: Follows one step commands with increased time     Cueing Cueing Techniques: Verbal cues     General Comments General comments (skin integrity, edema, etc.): continent BM on BSC, no bleeding/issues noted to R arm pre/post session & nurse called to room & made aware of events of session, explained to daughter reasoning for limited RUE use s/p cardiac cath with daughter approving pt to use RW to stand/transfer back to bed    Exercises     Assessment/Plan    PT Assessment Patient needs continued PT services  PT Problem List Decreased strength;Cardiopulmonary status limiting activity;Decreased coordination;Decreased range of motion;Decreased activity tolerance;Decreased knowledge of use of DME;Decreased balance;Decreased safety awareness;Decreased mobility;Decreased knowledge of precautions       PT Treatment Interventions DME instruction;Therapeutic exercise;Gait training;Balance training;Stair training;Neuromuscular re-education;Therapeutic activities;Patient/family education;Functional mobility training;Cognitive remediation;Modalities    PT Goals (Current goals can be found in the Care Plan section)  Acute Rehab PT  Goals Patient Stated Goal: go home vs rehab PT Goal Formulation: With patient/family Time For Goal Achievement: 03/19/24 Potential to Achieve Goals: Fair    Frequency Min 2X/week     Co-evaluation               AM-PAC PT 6 Clicks Mobility  Outcome Measure Help needed turning from your back to your side while in a flat bed without using bedrails?: A Lot Help needed moving from lying on your back to sitting on the side of a flat bed without using bedrails?: Total Help needed moving to and from a bed to a chair (including a wheelchair)?: Total Help needed standing up from a chair using your arms (e.g., wheelchair or bedside chair)?: Total Help needed to walk in hospital room?: Total Help needed climbing 3-5 steps with a railing? : Total 6 Click Score: 7    End of Session   Activity Tolerance: Patient tolerated treatment well Patient left: in bed;with call bell/phone within reach;with bed alarm set;with family/visitor present (in chair position) Nurse Communication: Mobility status PT Visit Diagnosis: Unsteadiness on feet (R26.81);Muscle weakness (generalized) (M62.81);Difficulty in walking, not elsewhere classified (R26.2);Other abnormalities of gait and mobility (R26.89)    Time: 8445-8377 PT Time Calculation (min) (ACUTE ONLY): 28  min   Charges:   PT Evaluation $PT Eval Moderate Complexity: 1 Mod   PT General Charges $$ ACUTE PT VISIT: 1 Visit         Richerd Pinal, PT, DPT 03/05/2024, 4:39 PM   Richerd CHRISTELLA Pinal 03/05/2024, 4:32 PM

## 2024-03-05 NOTE — TOC Initial Note (Signed)
 Transition of Care [facility]) - Initial/Assessment Note    Patient Details  Name: Jack White MRN: [id number] Date of Birth: 01-Feb-1941  Transition of Care [facility]) CM/SW Contact:    Jack [name], LCSW Phone Number: 03/05/2024, 12:59 PM  Clinical Narrative: Readmission prevention screen complete. CSW met with patient. No family at bedside. CSW introduced role and explained that discharge planning would be discussed. PCP is Jack Sayre, MD. Daughter-in-law drives him to appointments. [facility] in [place]. He reports one of his medications is $100 but did not specify which one. He lives with his step-daughter. No home health prior to admission. He has a RW, wheelchair, BSC, and shower chair at home. No further concerns. CSW will continue to follow patient for support and facilitate return home once stable. His stepdaughter will transport him home at discharge.                 Expected Discharge Plan: Home/Self Care Barriers to Discharge: Continued Medical Work up   Patient Goals and CMS Choice            Expected Discharge Plan and Services     Post Acute Care Choice: NA Living arrangements for the past 2 months: Single Family Home                                      Prior Living Arrangements/Services Living arrangements for the past 2 months: Single Family Home Lives with:: Adult Children Patient language and need for interpreter reviewed:: Yes Do you feel safe going back to the place where you live?: Yes      Need for Family Participation in Patient Care: Yes (Comment) Care giver support system in place?: Yes (comment) Current home services: DME Criminal Activity/Legal Involvement Pertinent to Current Situation/Hospitalization: No - Comment as needed  Activities of Daily Living   ADL Screening (condition at time of admission) Independently performs ADLs?: No Does the patient have a NEW difficulty with  bathing/dressing/toileting/self-feeding that is expected to last >3 days?: Yes (Initiates electronic notice to provider for possible OT consult) Does the patient have a NEW difficulty with getting in/out of bed, walking, or climbing stairs that is expected to last >3 days?: Yes (Initiates electronic notice to provider for possible PT consult) Does the patient have a NEW difficulty with communication that is expected to last >3 days?: Yes (Initiates electronic notice to provider for possible SLP consult) Is the patient deaf or have difficulty hearing?: Yes Does the patient have difficulty seeing, even when wearing glasses/contacts?: No Does the patient have difficulty concentrating, remembering, or making decisions?: Yes  Permission Sought/Granted                  Emotional Assessment Appearance:: Appears stated age Attitude/Demeanor/Rapport: Engaged, Gracious Affect (typically observed): Accepting, Appropriate, Calm, Pleasant Orientation: : Oriented to Self, Oriented to Place, Oriented to  Time, Oriented to Situation Alcohol  / Substance Use: Not Applicable Psych Involvement: No (comment)  Admission diagnosis:  Troponin level elevated [R79.89] Chest pain, moderate coronary artery risk [R07.9] Chest pain [R07.9] Patient Active Problem List   Diagnosis Date Noted   Unstable angina (HCC) 03/05/2024   Thrombocytopenia 03/04/2024   Precordial chest pain 03/03/2024   History of traumatic subdural hematoma s/p craniotomy 03/03/2024   Chest pain 03/03/2024   CAP (community acquired pneumonia) 08/31/2023   Hypotension 08/31/2023   Generalized weakness  08/31/2023   Parkinson's disease (HCC) 08/31/2023   Type 2 diabetes mellitus with chronic kidney disease, without long-term current use of insulin  (HCC) 08/31/2023   Dyslipidemia 08/31/2023   Acute renal failure superimposed on stage 3a chronic kidney disease (HCC) 08/13/2023   Hyperkalemia 08/13/2023   Aspiration pneumonia (HCC)  08/13/2023   Anemia 08/13/2023   HLD (hyperlipidemia) 08/13/2023   Parkinson disease (HCC) 08/13/2023   Trigeminal neuralgia 08/13/2023   BPH (benign prostatic hyperplasia) 08/13/2023   Insomnia 08/13/2023   Moderate malnutrition 07/23/2023   Acute gastroenteritis 04/07/2023   Intractable nausea and vomiting 04/07/2023   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   S/P colostomy (HCC)    Acute cholecystitis 10/14/2014   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   Colon perforation (HCC) 10/14/2014   H/O colostomy 10/14/2014   Diabetes mellitus, type 2 (HCC) 10/14/2014   Hypertension 10/14/2014   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   [name]'s neuralgia 10/14/2014   Gangrenous cholecystitis 06/27/2014   Stage 3a chronic kidney disease (HCC) 02/15/2014   Fungal infection of toenail 07/30/2013   PCP:  Diedra Lame, MD Pharmacy:   [facility] [address] [place] - [address] [place] [address] Phone: [phone] Fax: [phone]     Social Drivers of Health (SDOH) Social History: SDOH Screenings   Food Insecurity: No Food Insecurity (03/03/2024)  Housing: Low Risk (03/03/2024)  Transportation Needs: No Transportation Needs (03/03/2024)  Utilities: Not At Risk (03/03/2024)  Financial Resource Strain: High Risk (10/07/2023)   Received from [facility]  Social Connections: Moderately Integrated (03/03/2024)  Tobacco Use: Low Risk (12/09/2023)   SDOH Interventions:     Readmission Risk Interventions    03/05/2024   12:58 PM  Readmission Risk Prevention Plan  Transportation Screening Complete  PCP or Specialist Appt within 3-5 Days Complete  Social Work Consult for Recovery Care Planning/Counseling Complete  Palliative Care Screening Not Applicable  Medication Review [employer]) Complete

## 2024-03-06 ENCOUNTER — Other Ambulatory Visit: Payer: Self-pay

## 2024-03-06 DIAGNOSIS — N1831 Chronic kidney disease, stage 3a: Secondary | ICD-10-CM | POA: Diagnosis not present

## 2024-03-06 DIAGNOSIS — E875 Hyperkalemia: Secondary | ICD-10-CM | POA: Diagnosis not present

## 2024-03-06 DIAGNOSIS — I200 Unstable angina: Secondary | ICD-10-CM | POA: Diagnosis not present

## 2024-03-06 LAB — BASIC METABOLIC PANEL WITH GFR
Anion gap: 5 (ref 5–15)
BUN: 65 mg/dL — ABNORMAL HIGH (ref 8–23)
CO2: 28 mmol/L (ref 22–32)
Calcium: 8.2 mg/dL — ABNORMAL LOW (ref 8.9–10.3)
Chloride: 102 mmol/L (ref 98–111)
Creatinine, Ser: 1.88 mg/dL — ABNORMAL HIGH (ref 0.61–1.24)
GFR, Estimated: 35 mL/min — ABNORMAL LOW
Glucose, Bld: 144 mg/dL — ABNORMAL HIGH (ref 70–99)
Potassium: 4.7 mmol/L (ref 3.5–5.1)
Sodium: 134 mmol/L — ABNORMAL LOW (ref 135–145)

## 2024-03-06 LAB — CBC
HCT: 25.4 % — ABNORMAL LOW (ref 39.0–52.0)
Hemoglobin: 8.4 g/dL — ABNORMAL LOW (ref 13.0–17.0)
MCH: 32.7 pg (ref 26.0–34.0)
MCHC: 33.1 g/dL (ref 30.0–36.0)
MCV: 98.8 fL (ref 80.0–100.0)
Platelets: 140 K/uL — ABNORMAL LOW (ref 150–400)
RBC: 2.57 MIL/uL — ABNORMAL LOW (ref 4.22–5.81)
RDW: 12.6 % (ref 11.5–15.5)
WBC: 5.8 K/uL (ref 4.0–10.5)
nRBC: 0 % (ref 0.0–0.2)

## 2024-03-06 LAB — MAGNESIUM: Magnesium: 2.6 mg/dL — ABNORMAL HIGH (ref 1.7–2.4)

## 2024-03-06 MED ORDER — MELATONIN 5 MG PO TABS
5.0000 mg | ORAL_TABLET | Freq: Every day | ORAL | Status: DC
  Administered 2024-03-06 – 2024-03-16 (×11): 5 mg via ORAL
  Filled 2024-03-06 (×11): qty 1

## 2024-03-06 NOTE — Progress Notes (Signed)
 EKG was performed due to patient appeared have a change in rhythm V-fib/ v-tach. EKG sinus rhythm with 1st degree AV block. Patient had a stent placed yesterday. Dr. Lawence notified.

## 2024-03-06 NOTE — Progress Notes (Addendum)
 No changes noted in bruised area previously  noted on patients Left forearm. Color remains dark purple/black. No pain noted .

## 2024-03-06 NOTE — Evaluation (Addendum)
 Occupational Therapy Evaluation Patient Details Name: Jack White MRN: [id number] DOB: Jun 19, 1940 Today's Date: 03/06/2024   History of Present Illness   Pt is an 84 y/o M admitted on 03/02/24 after presenting with c/o chest pain & vomiting. Pt with positive nuclear stress test, s/p heart cath with PCI & stent 03/04/24. PMH: HTN, DM, CKD 3A, possible Parkinson's & trigeminal neuralgia, BPH, craniotomy 2/2 traumatic SDH     Clinical Impressions Chart reviewed to date, pt greeted in chair with supportive family present. He is alert and oriented x4, increased time for directives. PTA pt lives at home with family, has assist for ADLs, amb with RW household distances but has mwc follow/and or use as needed. Pt presents with deficits in strength, endurance, activity tolerance, balance, affecting safe and optimal ADL completion. MAX A+2 for STS with RW with knees buckling, decreasing after each attempt. BUE tremors noted throughout. Pt does not report dizziness. MAX A for LB dressing on this date. Discussed discharge recommendations with family, they are hopeful for continued progress to maximize his independence/functional performance. Pt is left in bedside chair, safety maintained, all needs met. OT will follow.     If plan is discharge home, recommend the following:   Two people to help with walking and/or transfers;Two people to help with bathing/dressing/bathroom;Supervision due to cognitive status     Functional Status Assessment   Patient has had a recent decline in their functional status and demonstrates the ability to make significant improvements in function in a reasonable and predictable amount of time.     Equipment Recommendations   None recommended by OT;Other (comment) (pt has recommended equipment)     Recommendations for Other Services         Precautions/Restrictions   Precautions Precautions: Fall Recall of Precautions/Restrictions:  Impaired Restrictions RUE Weight Bearing Per Provider Order:  (R heart cath 03/05/23)     Mobility Bed Mobility               General bed mobility comments: NT in recliner pre/post session    Transfers Overall transfer level: Needs assistance Equipment used: Rolling walker (2 wheels) Transfers: Sit to/from Stand Sit to Stand: Max assist, +2 physical assistance, +2 safety/equipment (from recliner 3 attempts, step by step multi modal cues for technique; pt with knee buckling at any standing attempts but lessens with time)                  Balance Overall balance assessment: Needs assistance Sitting-balance support: Feet supported Sitting balance-Leahy Scale: Fair     Standing balance support: During functional activity, Bilateral upper extremity supported, Reliant on assistive device for balance Standing balance-Leahy Scale: Zero Standing balance comment: static standing for approx 10 seconds on third attempt, attempts to march in place with MAX A +2                           ADL either performed or assessed with clinical judgement   ADL Overall ADL's : Needs assistance/impaired Eating/Feeding: Minimal assistance;Sitting                   Lower Body Dressing: Maximal assistance;Sitting/lateral leans Lower Body Dressing Details (indicate cue type and reason): donn shoes Toilet Transfer: Maximal assistance;+2 for physical assistance Toilet Transfer Details (indicate cue type and reason): simulated, pt with knees buckling; 3 attempts Toileting- Clothing Manipulation and Hygiene: Maximal assistance;Total assistance  Vision Patient Visual Report: No change from baseline       Perception         Praxis         Pertinent Vitals/Pain Pain Assessment Pain Assessment: No/denies pain     Extremity/Trunk Assessment Upper Extremity Assessment Upper Extremity Assessment: RUE deficits/detail;LUE deficits/detail;Generalized  weakness RUE Deficits / Details: AROM shoulder flexion approx 3/4 full AROM, elbow, wrist, hand grossly WFL; tremor noted throughout LUE Deficits / Details: baseline deficits per family report; tremor noted throughout   Lower Extremity Assessment Lower Extremity Assessment: Generalized weakness       Communication Communication Communication: Impaired Factors Affecting Communication: Hearing impaired   Cognition Arousal: Alert Behavior During Therapy: Flat affect Cognition: Cognition impaired         Attention impairment (select first level of impairment): Sustained attention Executive functioning impairment (select all impairments): Reasoning, Problem solving                   Following commands: Impaired Following commands impaired: Follows one step commands with increased time     Cueing  General Comments   Cueing Techniques: Verbal cues;Tactile cues;Visual cues  HR 90s bpm after mobility attempts, no dizziness reported throughout   Exercises Other Exercises Other Exercises: edu pt/daugther/SIL re role of OT, role of rehab, discharge recommendations   Shoulder Instructions      Home Living Family/patient expects to be discharged to:: Private residence Living Arrangements: Other relatives (step daugther) Available Help at Discharge: Family;Available PRN/intermittently Type of Home: Mobile home Home Access: Ramped entrance     Home Layout: One level     Bathroom Shower/Tub: Walk-in shower         Home Equipment: Agricultural Consultant (2 wheels);Shower seat;Wheelchair - manual;Lift chair   Additional Comments: lift chair      Prior Functioning/Environment               Mobility Comments: PRN assistance to get OOB, PRN assistance for transfers, ambulatory with RW, 1 fall in the past 6 months, PRN assistance for gait with RW, uses w/c ~1 day/week due to fatigue ADLs Comments: uses urinal to pee, sponge bathes 1x/week & gets in the shower 1x/week  (with +2 assist), assistance with bathing, dressing, & toileting; assist for IADLs from family    OT Problem List: Decreased strength;Decreased activity tolerance;Impaired balance (sitting and/or standing);Decreased cognition;Decreased knowledge of use of DME or AE;Decreased knowledge of precautions   OT Treatment/Interventions: Self-care/ADL training;Therapeutic exercise;DME and/or AE instruction;Patient/family education;Cognitive remediation/compensation;Therapeutic activities;Balance training      OT Goals(Current goals can be found in the care plan section)   Acute Rehab OT Goals Patient Stated Goal: go home OT Goal Formulation: With patient/family Time For Goal Achievement: 03/20/24 Potential to Achieve Goals: Fair ADL Goals Pt Will Perform Grooming: with supervision;sitting;standing Pt Will Perform Lower Body Dressing: sitting/lateral leans;with min assist Pt Will Transfer to Toilet: with supervision Pt Will Perform Toileting - Clothing Manipulation and hygiene: with supervision   OT Frequency:  Min 2X/week    Co-evaluation              AM-PAC OT 6 Clicks Daily Activity     Outcome Measure Help from another person eating meals?: A Little Help from another person taking care of personal grooming?: A Little Help from another person toileting, which includes using toliet, bedpan, or urinal?: A Lot Help from another person bathing (including washing, rinsing, drying)?: A Lot Help from another person to put on and taking off  regular upper body clothing?: A Little Help from another person to put on and taking off regular lower body clothing?: A Lot 6 Click Score: 15   End of Session Equipment Utilized During Treatment: Rolling walker (2 wheels);Gait belt Nurse Communication: Mobility status  Activity Tolerance: Patient tolerated treatment well Patient left: in chair;with call bell/phone within reach;with chair alarm set  OT Visit Diagnosis: Other abnormalities of  gait and mobility (R26.89);Muscle weakness (generalized) (M62.81)                Time: 8782-8746 OT Time Calculation (min): 36 min Charges:  OT General Charges $OT Visit: 1 Visit OT Evaluation $OT Eval High Complexity: 1 High Therisa Sheffield, OTD OTR/L  03/06/2024, 2:07 PM

## 2024-03-06 NOTE — Plan of Care (Signed)

## 2024-03-06 NOTE — Progress Notes (Signed)
 Physical Therapy Treatment Patient Details Name: Jack White MRN: [id number] DOB: 12-17-1940 Today's Date: 03/06/2024   History of Present Illness Pt is an 84 y/o M admitted on 03/02/24 after presenting with c/o chest pain & vomiting. Pt with positive nuclear stress test, s/p heart cath with PCI & stent 03/04/24. PMH: HTN, DM, CKD 3A, possible Parkinson's & trigeminal neuralgia, BPH, craniotomy 2/2 traumatic SDH    PT Comments  PT treatment focused on Multiple sit<>stands with RW then with STEDY working in activity tolerance, LE strengthening/stretching, and posture awareness ( cues for upright posture and weight shifting over feet vs. just in heels) x4 15-30 secs; pt's LEs fatigue quickly. Pt continues to experience limitations to mobility, demonstrating decreased activity tolerance, general weakness, and poor postural awareness. Continue to recommend post acute rehab <3 hours therapy/day upon d/c.     If plan is discharge home, recommend the following: Two people to help with walking and/or transfers;Two people to help with bathing/dressing/bathroom   Can travel by private vehicle     Yes  Equipment Recommendations  None recommended by PT    Recommendations for Other Services       Precautions / Restrictions Precautions Precautions: Fall Recall of Precautions/Restrictions: Impaired Restrictions Weight Bearing Restrictions Per Provider Order: Yes (R heart cath 03/05/23) Other Position/Activity Restrictions: recent heart cath RUE     Mobility  Bed Mobility               General bed mobility comments: NT in recliner pre/post session    Transfers Overall transfer level: Needs assistance Equipment used: Rolling walker (2 wheels) (STEDY) Transfers: Sit to/from Stand Sit to Stand: Max assist, +2 physical assistance, +2 safety/equipment           General transfer comment: Multiple sit<>stands with RW then with STEDY working in activity tolerance, LE  strengthening/stretching, and posture awareness ( cues for upright posture and weight shifting over feet vs. just in heels) x4 15-30 secs; pt's LEs fatigue quickly.    Ambulation/Gait               General Gait Details: unable at this time.   Stairs             Wheelchair Mobility     Tilt Bed    Modified Rankin (Stroke Patients Only)       Balance Overall balance assessment: Needs assistance Sitting-balance support: Feet supported Sitting balance-Leahy Scale: Fair     Standing balance support: During functional activity, Bilateral upper extremity supported, Reliant on assistive device for balance Standing balance-Leahy Scale: Poor Standing balance comment: static standing for approx 15-30 seconds                            Communication Communication Communication: Impaired Factors Affecting Communication: Hearing impaired  Cognition Arousal: Alert Behavior During Therapy: Flat affect   PT - Cognitive impairments: Safety/Judgement, Problem solving, Awareness                         Following commands: Impaired Following commands impaired: Follows one step commands with increased time    Cueing Cueing Techniques: Verbal cues, Tactile cues, Visual cues  Exercises      General Comments General comments (skin integrity, edema, etc.): HR 90s bpm after mobility attempts, no dizziness reported throughout      Pertinent Vitals/Pain Pain Assessment Pain Assessment: No/denies pain    Home Living Family/patient expects  to be discharged to:: Private residence Living Arrangements: Other relatives (step daugther) Available Help at Discharge: Family;Available PRN/intermittently Type of Home: Mobile home Home Access: Ramped entrance       Home Layout: One level Home Equipment: Agricultural Consultant (2 wheels);Shower seat;Wheelchair - manual;Lift chair Additional Comments: lift chair    Prior Function            PT Goals (current  goals can now be found in the care plan section) Acute Rehab PT Goals Patient Stated Goal: go home vs rehab PT Goal Formulation: With patient/family Time For Goal Achievement: 03/19/24 Potential to Achieve Goals: Fair Progress towards PT goals: Progressing toward goals    Frequency    Min 2X/week      PT Plan      Co-evaluation              AM-PAC PT 6 Clicks Mobility   Outcome Measure  Help needed turning from your back to your side while in a flat bed without using bedrails?: A Lot Help needed moving from lying on your back to sitting on the side of a flat bed without using bedrails?: Total Help needed moving to and from a bed to a chair (including a wheelchair)?: Total Help needed standing up from a chair using your arms (e.g., wheelchair or bedside chair)?: Total Help needed to walk in hospital room?: Total Help needed climbing 3-5 steps with a railing? : Total 6 Click Score: 7    End of Session Equipment Utilized During Treatment: Gait belt Activity Tolerance: Patient limited by fatigue;Patient tolerated treatment well Patient left: in chair;with call bell/phone within reach Nurse Communication: Mobility status PT Visit Diagnosis: Unsteadiness on feet (R26.81);Muscle weakness (generalized) (M62.81);Difficulty in walking, not elsewhere classified (R26.2);Other abnormalities of gait and mobility (R26.89)     Time: 1530-1550 PT Time Calculation (min) (ACUTE ONLY): 20 min  Charges:    $Therapeutic Activity: 23-37 mins PT General Charges $$ ACUTE PT VISIT: 1 Visit                     Harland Irving, PTA  03/06/2024, 4:06 PM

## 2024-03-06 NOTE — Progress Notes (Signed)
 [facility] Cardiology    SUBJECTIVE: Denies any chest pain still complains of weakness in his legs trying to ambulate but denies any shortness of breath denies any palpitations or tachycardia   Vitals:   03/05/24 2053 03/06/24 0025 03/06/24 0837 03/06/24 1227  BP: 91/61 92/63 (!) 123/53 (!) 125/59  Pulse: 66 63 61 70  Resp: 19 19 18 18   Temp: 98.1 F (36.7 C) 97.6 F (36.4 C) 98 F (36.7 C) (!) 96.2 F (35.7 C)  TempSrc:      SpO2: 95% 97% 97% 100%  Weight:         Intake/Output Summary (Last 24 hours) at 03/06/2024 1442 Last data filed at 03/06/2024 1441 Gross per 24 hour  Intake 720 ml  Output 1300 ml  Net -580 ml      PHYSICAL EXAM  General: Well developed, well nourished, in no acute distress HEENT:  Normocephalic and atramatic Neck:  No JVD.  Lungs: Clear bilaterally to auscultation and percussion. Heart: HRRR . Normal S1 and S2 without gallops or murmurs.  Abdomen: Bowel sounds are positive, abdomen soft and non-tender  Msk:  Back normal, normal gait. Normal strength and tone for age. Extremities: No clubbing, cyanosis or edema.   Neuro: Alert and oriented X 3. Psych:  Good affect, responds appropriately   LABS: Basic Metabolic Panel: Recent Labs    03/05/24 1307 03/06/24 0454  NA 136 134*  K 5.1 4.7  CL 101 102  CO2 27 28  GLUCOSE 169* 144*  BUN 54* 65*  CREATININE 1.76* 1.88*  CALCIUM  8.1* 8.2*  MG  --  2.6*   Liver Function Tests: No results for input(s): AST, ALT, ALKPHOS, BILITOT, PROT, ALBUMIN  in the last 72 hours. No results for input(s): LIPASE, AMYLASE in the last 72 hours. CBC: Recent Labs    03/05/24 0412 03/06/24 0454  WBC 6.2 5.8  HGB 9.1* 8.4*  HCT 27.0* 25.4*  MCV 95.1 98.8  PLT 171 140*   Cardiac Enzymes: No results for input(s): CKTOTAL, CKMB, CKMBINDEX, TROPONINI in the last 72 hours. BNP: Invalid input(s): POCBNP D-Dimer: No results for input(s): DDIMER in the last 72 hours. Hemoglobin  A1C: No results for input(s): HGBA1C in the last 72 hours. Fasting Lipid Panel: No results for input(s): CHOL, HDL, LDLCALC, TRIG, CHOLHDL, LDLDIRECT in the last 72 hours. Thyroid  Function Tests: No results for input(s): TSH, T4TOTAL, T3FREE, THYROIDAB in the last 72 hours.  Invalid input(s): FREET3 Anemia Panel: No results for input(s): VITAMINB12, FOLATE, FERRITIN, TIBC, IRON , RETICCTPCT in the last 72 hours.  No results found.   Echo preserved left ventricular function EF of 55%  TELEMETRY: Normal sinus rhythm rate of 75:  ASSESSMENT AND PLAN:  Principal Problem:   Precordial chest pain Active Problems:   Stage 3a chronic kidney disease (HCC)   Diabetes mellitus, type 2 (HCC)   Hypertension   Hyperkalemia   Parkinson disease (HCC)   Trigeminal neuralgia   BPH (benign prostatic hyperplasia)   History of traumatic subdural hematoma s/p craniotomy   Chest pain   Thrombocytopenia   Unstable angina (HCC)    Plan Status post PCI and stent DES to LAD continue Plavix  aspirin  Agree diabetes management and control Coronary disease subsequent PCI and stent DES History of traumatic subdural hematoma status post craniotomy continue conservative management Shortness of breath dyspnea with chest pain appears to be angina known coronary disease related continue medical therapy Obesity recommend modest weight loss exercise portion control Echocardiogram with preserved left ventricular function  continue current therapy Recommend continued physical therapy Occupational Therapy for strength training prior to discharge Moderate to high high intensity statin therapy for lipid management Family preferred not to go to skilled nursing facility and consider outpatient physical therapy Once patient is discharged have the patient follow-up with cardiology 1 to 2 weeks  Cara JONETTA Lovelace, MD, 03/06/2024 2:42 PM

## 2024-03-06 NOTE — Progress Notes (Signed)
" °  Progress Note   Patient: Jack White FMW:[id number] DOB: 1940/03/14 DOA: 03/02/2024     2 DOS: the patient was seen and examined on 03/06/2024   Brief hospital course: GENERAL WEARING is a 84 y.o. 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  Patient has elevated D-dimer, CT angiogram was negative for PE. Patient also had a stress nuclear stress test, was positive for ischemia.  Patient had a heart cath performed on 1/8, placed LAD drug-eluting stent.   Principal Problem:   Precordial chest pain Active Problems:   Stage 3a chronic kidney disease (HCC)   Diabetes mellitus, type 2 (HCC)   Hypertension   Hyperkalemia   Parkinson disease (HCC)   Trigeminal neuralgia   BPH (benign prostatic hyperplasia)   History of traumatic subdural hematoma s/p craniotomy   Chest pain   Thrombocytopenia   Unstable angina (HCC)   Assessment and Plan: Unstable angina with coronary disease Minimal elevation troponin. Negative for PE, positive nuclear stress test.   Heart cath showed 95% stenosis in the LAD.  Status post drug-eluting stent, will continue dual antiplatelet treatment. Patient doing well today.  Likely acute blood loss anemia. Thrombocytopenia. Hemoglobin dropped down to 8.4, he has some bleeding from catheter site. Recent iron  and B12 levels are normal. Continue follow hemoglobin.  Stage 3a chronic kidney disease (HCC) Hyperkalemia Hyponatremia. Potassium has been normalized, renal function slightly worsened after heart cath, continue to follow.   History of traumatic subdural hematoma s/p craniotomy No acute issues   BPH (benign prostatic hyperplasia) Continue tamsulosin    Parkinson disease (HCC) Trigeminal neuralgia Continue gabapentin  and carbamazepine    Hypertension Continue home  meds   Diabetes mellitus, type 2 (HCC) Sliding scale insulin  coverage      Subjective:  Patient doing well today, denies any chest pain or shortness of breath.  Physical Exam: Vitals:   03/05/24 1531 03/05/24 2053 03/06/24 0025 03/06/24 0837  BP: (!) 101/47 91/61 92/63  (!) 123/53  Pulse: 74 66 63 61  Resp: 19 19 19 18   Temp: 98.6 F (37 C) 98.1 F (36.7 C) 97.6 F (36.4 C) 98 F (36.7 C)  TempSrc:      SpO2: 95% 95% 97% 97%  Weight:       General exam: Appears calm and comfortable  Respiratory system: Clear to auscultation. Respiratory effort normal. Cardiovascular system: S1 & S2 heard, RRR. No JVD, murmurs, rubs, gallops or clicks. No pedal edema. Gastrointestinal system: Abdomen is nondistended, soft and nontender. No organomegaly or masses felt. Normal bowel sounds heard. Central nervous system: Alert and oriented x3. No focal neurological deficits. Extremities: Symmetric 5 x 5 power. Skin: No rashes, lesions or ulcers Psychiatry: Mood & affect appropriate.    Data Reviewed:  Lab results reviewed.  Family Communication: None  Disposition: Status is: Inpatient Remains inpatient appropriate because: Unsafe discharge, pending nursing home placement.     Time spent: 35 minutes  Author: Murvin Mana, MD 03/06/2024 12:26 PM  For on call review [email or web address].    "

## 2024-03-07 DIAGNOSIS — I200 Unstable angina: Secondary | ICD-10-CM | POA: Diagnosis not present

## 2024-03-07 DIAGNOSIS — N1831 Chronic kidney disease, stage 3a: Secondary | ICD-10-CM | POA: Diagnosis not present

## 2024-03-07 DIAGNOSIS — E875 Hyperkalemia: Secondary | ICD-10-CM | POA: Diagnosis not present

## 2024-03-07 LAB — CBC
HCT: 24.8 % — ABNORMAL LOW (ref 39.0–52.0)
Hemoglobin: 8.1 g/dL — ABNORMAL LOW (ref 13.0–17.0)
MCH: 32 pg (ref 26.0–34.0)
MCHC: 32.7 g/dL (ref 30.0–36.0)
MCV: 98 fL (ref 80.0–100.0)
Platelets: 146 K/uL — ABNORMAL LOW (ref 150–400)
RBC: 2.53 MIL/uL — ABNORMAL LOW (ref 4.22–5.81)
RDW: 12.7 % (ref 11.5–15.5)
WBC: 5.4 K/uL (ref 4.0–10.5)
nRBC: 0 % (ref 0.0–0.2)

## 2024-03-07 LAB — BASIC METABOLIC PANEL WITH GFR
Anion gap: 5 (ref 5–15)
BUN: 64 mg/dL — ABNORMAL HIGH (ref 8–23)
CO2: 28 mmol/L (ref 22–32)
Calcium: 8.2 mg/dL — ABNORMAL LOW (ref 8.9–10.3)
Chloride: 103 mmol/L (ref 98–111)
Creatinine, Ser: 1.82 mg/dL — ABNORMAL HIGH (ref 0.61–1.24)
GFR, Estimated: 36 mL/min — ABNORMAL LOW
Glucose, Bld: 146 mg/dL — ABNORMAL HIGH (ref 70–99)
Potassium: 5.3 mmol/L — ABNORMAL HIGH (ref 3.5–5.1)
Sodium: 136 mmol/L (ref 135–145)

## 2024-03-07 MED ORDER — SODIUM ZIRCONIUM CYCLOSILICATE 5 G PO PACK
5.0000 g | PACK | Freq: Once | ORAL | Status: AC
  Administered 2024-03-07: 5 g via ORAL
  Filled 2024-03-07: qty 1

## 2024-03-07 NOTE — Progress Notes (Signed)
 [facility] Cardiology    SUBJECTIVE: Patient states to be doing reasonably well no recurrent chest pain no shortness of breath still trying to exercise and walk with physical therapy.  Complain of generalized weakness fatigue in his legs has history of evaluated for Parkinson with son out of Parkinson's he has trigeminal neuralgia previous craniotomy from traumatic subdural seems to be doing much better now   Vitals:   03/06/24 2354 03/07/24 0552 03/07/24 0819 03/07/24 1155  BP: (!) 111/54 (!) 130/58 (!) 138/52 (!) 111/47  Pulse: 70 79 80 90  Resp: 16 16 16 18   Temp: 98 F (36.7 C) 98 F (36.7 C) 97.7 F (36.5 C) (!) 97.5 F (36.4 C)  TempSrc:  Oral    SpO2: 96% 96% 97% 96%  Weight:         Intake/Output Summary (Last 24 hours) at 03/07/2024 1226 Last data filed at 03/07/2024 1041 Gross per 24 hour  Intake 960 ml  Output 1200 ml  Net -240 ml      PHYSICAL EXAM  General: Well developed, well nourished, in no acute distress HEENT:  Normocephalic and atramatic Neck:  No JVD.  Lungs: Clear bilaterally to auscultation and percussion. Heart: HRRR . Normal S1 and S2 without gallops or murmurs.  Abdomen: Bowel sounds are positive, abdomen soft and non-tender  Msk:  Back normal, normal gait. Normal strength and tone for age. Extremities: No clubbing, cyanosis or edema.   Neuro: Alert and oriented X 3. Psych:  Good affect, responds appropriately   LABS: Basic Metabolic Panel: Recent Labs    03/06/24 0454 03/07/24 0551  NA 134* 136  K 4.7 5.3*  CL 102 103  CO2 28 28  GLUCOSE 144* 146*  BUN 65* 64*  CREATININE 1.88* 1.82*  CALCIUM  8.2* 8.2*  MG 2.6*  --    Liver Function Tests: No results for input(s): AST, ALT, ALKPHOS, BILITOT, PROT, ALBUMIN  in the last 72 hours. No results for input(s): LIPASE, AMYLASE in the last 72 hours. CBC: Recent Labs    03/06/24 0454 03/07/24 0551  WBC 5.8 5.4  HGB 8.4* 8.1*  HCT 25.4* 24.8*  MCV 98.8 98.0  PLT 140* 146*    Cardiac Enzymes: No results for input(s): CKTOTAL, CKMB, CKMBINDEX, TROPONINI in the last 72 hours. BNP: Invalid input(s): POCBNP D-Dimer: No results for input(s): DDIMER in the last 72 hours. Hemoglobin A1C: No results for input(s): HGBA1C in the last 72 hours. Fasting Lipid Panel: No results for input(s): CHOL, HDL, LDLCALC, TRIG, CHOLHDL, LDLDIRECT in the last 72 hours. Thyroid  Function Tests: No results for input(s): TSH, T4TOTAL, T3FREE, THYROIDAB in the last 72 hours.  Invalid input(s): FREET3 Anemia Panel: No results for input(s): VITAMINB12, FOLATE, FERRITIN, TIBC, IRON , RETICCTPCT in the last 72 hours.  No results found.   Echo preserved left ventricular function EF of at least 55%  TELEMETRY: Normal sinus rhythm rate of 75:  ASSESSMENT AND PLAN:  Principal Problem:   Precordial chest pain Active Problems:   Stage 3a chronic kidney disease (HCC)   Diabetes mellitus, type 2 (HCC)   Hypertension   Hyperkalemia   Parkinson disease (HCC)   Trigeminal neuralgia   BPH (benign prostatic hyperplasia)   History of traumatic subdural hematoma s/p craniotomy   Chest pain   Thrombocytopenia   Unstable angina (HCC)    Plan Coronary disease status post PCI and stent continue Plavix  aspirin  Hyperlipidemia continue statin therapy for hyperlipidemia management Hypertension control Chronic renal sufficiency continue hydration have the patient  follow-up with nephrology Diabetes type 2 continue current therapy A1c goal less than 7 Angina status post PCI and stent improved symptom Obesity recommend modest weight loss exercise portion control Physical therapy increase activity Follow-up with cardiology as an outpatient  Cara JONETTA Lovelace, MD, 03/07/2024 12:26 PM

## 2024-03-07 NOTE — Plan of Care (Signed)

## 2024-03-07 NOTE — Progress Notes (Signed)
" °  Progress Note   Patient: Jack White FMW:[id number] DOB: 07/27/1940 DOA: 03/02/2024     3 DOS: the patient was seen and examined on 03/07/2024   Brief hospital course: Jack White is a 84 y.o. 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  Patient has elevated D-dimer, CT angiogram was negative for PE. Patient also had a stress nuclear stress test, was positive for ischemia.  Patient had a heart cath performed on 1/8, placed LAD drug-eluting stent.   Principal Problem:   Precordial chest pain Active Problems:   Stage 3a chronic kidney disease (HCC)   Diabetes mellitus, type 2 (HCC)   Hypertension   Hyperkalemia   Parkinson disease (HCC)   Trigeminal neuralgia   BPH (benign prostatic hyperplasia)   History of traumatic subdural hematoma s/p craniotomy   Chest pain   Thrombocytopenia   Unstable angina (HCC)   Assessment and Plan: Unstable angina with coronary disease Minimal elevation troponin. Negative for PE, positive nuclear stress test.   Heart cath showed 95% stenosis in the LAD.  Status post drug-eluting stent, will continue dual antiplatelet treatment. Condition has improved.   Likely acute blood loss anemia. Thrombocytopenia. Hemoglobin dropped down to 8.4, he has some bleeding from catheter site. Recent iron  and B12 levels are normal. Hemoglobin dropped down to 8.1, platelets recovering to 146.  Continue to follow.  Stage 3a chronic kidney disease (HCC) Hyperkalemia Hyponatremia. Give another dose of Lokelma , recheck of BMP tomorrow.  Sodium level has normalized.  Renal function is gradually improving.   History of traumatic subdural hematoma s/p craniotomy No acute issues   BPH (benign prostatic hyperplasia) Continue tamsulosin    Parkinson disease (HCC) Trigeminal  neuralgia Continue gabapentin  and carbamazepine    Hypertension Continue home meds   Diabetes mellitus, type 2 (HCC) Sliding scale insulin  coverage  Generalized weakness. PT/OT recommended nursing home placement.  But the patient is making good progress.  Will continue to follow.    Subjective:  Patient doing well today, no significant confusion.  Good appetite.  Physical Exam: Vitals:   03/06/24 2354 03/07/24 0552 03/07/24 0819 03/07/24 1155  BP: (!) 111/54 (!) 130/58 (!) 138/52 (!) 111/47  Pulse: 70 79 80 90  Resp: 16 16 16 18   Temp: 98 F (36.7 C) 98 F (36.7 C) 97.7 F (36.5 C) (!) 97.5 F (36.4 C)  TempSrc:  Oral    SpO2: 96% 96% 97% 96%  Weight:       General exam: Appears calm and comfortable  Respiratory system: Clear to auscultation. Respiratory effort normal. Cardiovascular system: S1 & S2 heard, RRR. No JVD, murmurs, rubs, gallops or clicks. No pedal edema. Gastrointestinal system: Abdomen is nondistended, soft and nontender. No organomegaly or masses felt. Normal bowel sounds heard. Central nervous system: Alert and oriented. No focal neurological deficits. Extremities: Symmetric 5 x 5 power. Skin: No rashes, lesions or ulcers Psychiatry: Judgement and insight appear normal. Mood & affect appropriate.    Data Reviewed:  Lab results reviewed.  Family Communication: Daughter updated at bedside.  Disposition: Status is: Inpatient Remains inpatient appropriate because: Unsafe discharge, pending nursing home placement.     Time spent: 35 minutes  Author: Murvin Mana, MD 03/07/2024 1:30 PM  For on call review [email or web address].    "

## 2024-03-08 ENCOUNTER — Inpatient Hospital Stay

## 2024-03-08 DIAGNOSIS — E875 Hyperkalemia: Secondary | ICD-10-CM | POA: Diagnosis not present

## 2024-03-08 DIAGNOSIS — I200 Unstable angina: Secondary | ICD-10-CM | POA: Diagnosis not present

## 2024-03-08 DIAGNOSIS — D508 Other iron deficiency anemias: Secondary | ICD-10-CM | POA: Diagnosis not present

## 2024-03-08 DIAGNOSIS — N1831 Chronic kidney disease, stage 3a: Secondary | ICD-10-CM | POA: Diagnosis not present

## 2024-03-08 LAB — BASIC METABOLIC PANEL WITH GFR
Anion gap: 5 (ref 5–15)
BUN: 61 mg/dL — ABNORMAL HIGH (ref 8–23)
CO2: 28 mmol/L (ref 22–32)
Calcium: 8.2 mg/dL — ABNORMAL LOW (ref 8.9–10.3)
Chloride: 103 mmol/L (ref 98–111)
Creatinine, Ser: 1.83 mg/dL — ABNORMAL HIGH (ref 0.61–1.24)
GFR, Estimated: 36 mL/min — ABNORMAL LOW
Glucose, Bld: 176 mg/dL — ABNORMAL HIGH (ref 70–99)
Potassium: 5.3 mmol/L — ABNORMAL HIGH (ref 3.5–5.1)
Sodium: 136 mmol/L (ref 135–145)

## 2024-03-08 LAB — GLUCOSE, CAPILLARY: Glucose-Capillary: 161 mg/dL — ABNORMAL HIGH (ref 70–99)

## 2024-03-08 LAB — CBC
HCT: 25 % — ABNORMAL LOW (ref 39.0–52.0)
Hemoglobin: 8.2 g/dL — ABNORMAL LOW (ref 13.0–17.0)
MCH: 31.9 pg (ref 26.0–34.0)
MCHC: 32.8 g/dL (ref 30.0–36.0)
MCV: 97.3 fL (ref 80.0–100.0)
Platelets: 170 K/uL (ref 150–400)
RBC: 2.57 MIL/uL — ABNORMAL LOW (ref 4.22–5.81)
RDW: 12.9 % (ref 11.5–15.5)
WBC: 5.7 K/uL (ref 4.0–10.5)
nRBC: 0 % (ref 0.0–0.2)

## 2024-03-08 LAB — CARDIAC CATHETERIZATION: Cath EF Quantitative: 55 %

## 2024-03-08 MED ORDER — SODIUM ZIRCONIUM CYCLOSILICATE 10 G PO PACK
10.0000 g | PACK | Freq: Once | ORAL | Status: AC
  Administered 2024-03-08: 10 g via ORAL
  Filled 2024-03-08 (×2): qty 1

## 2024-03-08 MED ORDER — ATORVASTATIN CALCIUM 20 MG PO TABS
40.0000 mg | ORAL_TABLET | Freq: Every day | ORAL | Status: DC
  Administered 2024-03-08 – 2024-03-16 (×9): 40 mg via ORAL
  Filled 2024-03-08 (×9): qty 2

## 2024-03-08 MED ORDER — SODIUM CHLORIDE 0.9 % IV SOLN: INTRAVENOUS | Status: DC

## 2024-03-08 MED ORDER — LACTULOSE 10 GM/15ML PO SOLN
20.0000 g | Freq: Once | ORAL | Status: AC
  Administered 2024-03-08: 20 g via ORAL
  Filled 2024-03-08: qty 30

## 2024-03-08 NOTE — Plan of Care (Signed)

## 2024-03-08 NOTE — Progress Notes (Signed)
 Physical Therapy Treatment Patient Details Name: Jack White MRN: [id number] DOB: 02-23-1941 Today's Date: 03/08/2024   History of Present Illness Pt is an 84 y/o M admitted on 03/02/24 after presenting with c/o chest pain & vomiting. Pt with positive nuclear stress test, s/p heart cath with PCI & stent 03/04/24. PMH: HTN, DM, CKD 3A, possible Parkinson's & trigeminal neuralgia, BPH, craniotomy 2/2 traumatic SDH    PT Comments  Pt was very alert and cooperative. Continues to be Max+2 for sit<>stand with RW but LE's fatigue so quickly that even 2nd attempt was total A +2 to stand with RW.  Pt demonstrates severe ataxic movement in LE's and currently is unable to shift weight onto one LE sufficiently to be able to step.  Continue to recommend post acute rehab <3 hours therapy/day upon d/c.     If plan is discharge home, recommend the following: Two people to help with walking and/or transfers;Two people to help with bathing/dressing/bathroom   Can travel by private vehicle     Yes  Equipment Recommendations  None recommended by PT    Recommendations for Other Services       Precautions / Restrictions Precautions Precautions: Fall Recall of Precautions/Restrictions: Impaired Restrictions Weight Bearing Restrictions Per Provider Order: No Other Position/Activity Restrictions: recent heart cath RUE     Mobility  Bed Mobility               General bed mobility comments: NT in recliner pre/post session    Transfers Overall transfer level: Needs assistance Equipment used: Rolling walker (2 wheels) Transfers: Sit to/from Stand Sit to Stand: Max assist, +2 physical assistance, +2 safety/equipment           General transfer comment: Multiple sit<>stands with RW then with STEDY working in activity tolerance, LE strengthening/stretching, and posture awareness ( cues for upright posture and weight shifting over feet vs. just in heels) x3 15-30 secs; pt's LEs fatigue quickly.     Ambulation/Gait               General Gait Details: unable at this time.   Stairs             Wheelchair Mobility     Tilt Bed    Modified Rankin (Stroke Patients Only)       Balance Overall balance assessment: Needs assistance Sitting-balance support: Feet supported Sitting balance-Leahy Scale: Fair     Standing balance support: During functional activity, Bilateral upper extremity supported, Reliant on assistive device for balance Standing balance-Leahy Scale: Poor Standing balance comment: static standing for approx 15-30 seconds                            Communication Communication Communication: Impaired Factors Affecting Communication: Hearing impaired  Cognition Arousal: Alert Behavior During Therapy: Flat affect   PT - Cognitive impairments: Safety/Judgement, Problem solving, Awareness                         Following commands: Impaired Following commands impaired: Follows one step commands with increased time    Cueing Cueing Techniques: Verbal cues, Tactile cues, Visual cues  Exercises      General Comments        Pertinent Vitals/Pain      Home Living                          Prior Function  PT Goals (current goals can now be found in the care plan section) Acute Rehab PT Goals Patient Stated Goal: go home vs rehab PT Goal Formulation: With patient/family Time For Goal Achievement: 03/19/24 Potential to Achieve Goals: Fair Progress towards PT goals: Progressing toward goals    Frequency    Min 2X/week      PT Plan      Co-evaluation              AM-PAC PT 6 Clicks Mobility   Outcome Measure  Help needed turning from your back to your side while in a flat bed without using bedrails?: A Lot Help needed moving from lying on your back to sitting on the side of a flat bed without using bedrails?: Total Help needed moving to and from a bed to a chair (including a  wheelchair)?: Total Help needed standing up from a chair using your arms (e.g., wheelchair or bedside chair)?: Total Help needed to walk in hospital room?: Total Help needed climbing 3-5 steps with a railing? : Total 6 Click Score: 7    End of Session Equipment Utilized During Treatment: Gait belt Activity Tolerance: Patient limited by fatigue;Patient tolerated treatment well Patient left: in chair;with call bell/phone within reach Nurse Communication: Mobility status PT Visit Diagnosis: Unsteadiness on feet (R26.81);Muscle weakness (generalized) (M62.81);Difficulty in walking, not elsewhere classified (R26.2);Other abnormalities of gait and mobility (R26.89)     Time: 8799-8777 PT Time Calculation (min) (ACUTE ONLY): 22 min  Charges:    $Therapeutic Activity: 8-22 mins PT General Charges $$ ACUTE PT VISIT: 1 Visit                    Harland Irving, PTA  03/08/2024, 12:37 PM

## 2024-03-08 NOTE — Progress Notes (Signed)
 " [facility] CLINIC CARDIOLOGY PROGRESS NOTE       Patient ID: Jack White MRN: [id number] DOB/AGE: [birth year] 86 y.o.  Admit date: 03/02/2024 Referring Physician Dr. Delayne Solian Primary Physician Diedra Lame, MD  Primary Cardiologist none Reason for Consultation chest pain  HPI: Jack White is a 84 y.o. male  with a past medical history of hypertension, type 2 diabetes, CKD stage IIIa, BPH, history of traumatic subdural hematoma s/p craniotomy who presented to the ED on 03/02/2024 for shortness of breath and chest pain. Cardiology was consulted for further evaluation.   Interval history: -Patient seen and examined this AM, resting in hospital bed.  - Somnolent but does arouse to questions and states that he does not have any chest pain or shortness of breath this morning. - Left arm bruises stable. - BP stable.  No events noted on telemetry.  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 Lucien   COLOSTOMY TAKEDOWN N/A 10/26/2014   Procedure: COLOSTOMY TAKEDOWN;  Surgeon: Elgin Laurence III, MD;  Location: [facility] ORS;  Service: General;  Laterality: N/A;   CRANIOTOMY Right 07/21/2023   Procedure: CRANIOTOMY HEMATOMA EVACUATION SUBDURAL;  Surgeon: Lanis Pupa, MD;  Location: [facility] OR;  Service: Neurosurgery;  Laterality: Right;   EXPLORATORY LAPAROTOMY  05/28/2014   Dr.  Lucien   LAPAROSCOPIC CHOLECYSTECTOMY  05/21/2014   Dr. Laurence    Medications Prior to Admission  Medication Sig Dispense Refill Last Dose/Taking   acetaminophen  (TYLENOL ) 500 MG tablet Take 500-1,000 mg by mouth 2 (two) times daily.   Unknown   atorvastatin  (LIPITOR) 10 MG tablet Take 1 tablet (10 mg total) by mouth at bedtime.   03/01/2024 Bedtime   butalbital -aspirin -caffeine  (FIORINAL) 50-325-40 MG capsule TAKE 1 CAPSULE BY MOUTH EVERY 6 HOURS AS NEEDED FOR HEADACHE   Unknown   carbamazepine  (TEGRETOL ) 200 MG tablet Take 1 tablet (200 mg total) by mouth 3 (three) times daily.   03/02/2024 Morning   diphenhydrAMINE  (BENADRYL ) 25 MG tablet Take 2 tablets (50 mg total) by mouth at bedtime.   03/01/2024 Bedtime   doxazosin  (CARDURA ) 4 MG tablet Take 4 mg by mouth daily.   03/02/2024 Morning   DULoxetine  (CYMBALTA ) 60 MG capsule Take 60 mg by mouth daily.   03/02/2024 Morning   ergocalciferol  (VITAMIN D2) 1.25 MG (50000 UT) capsule Take 50,000 Units by mouth once a week.   Past Week   ferrous sulfate  325 (65 FE) MG tablet Take 1 tablet (325 mg total) by mouth daily with breakfast.   03/02/2024 Morning   fluticasone  (FLONASE ) 50 MCG/ACT nasal spray Place 2 sprays into both nostrils daily as needed for allergies.   Unknown   folic acid  (FOLVITE ) 1 MG tablet Take 1 tablet (1 mg total) by mouth daily.   03/02/2024 Morning   gabapentin  (NEURONTIN ) 300  MG capsule Take 300 mg by mouth at bedtime.   03/01/2024 Bedtime   guaiFENesin -dextromethorphan (ROBITUSSIN DM) 100-10 MG/5ML syrup Take 5 mLs by mouth every 4 (four) hours as needed for cough. 118 mL 0 Unknown   hydrocortisone  cream 1 % Apply 1 Application topically 3 (three) times daily as needed for itching.   Unknown   hydroxypropyl methylcellulose / hypromellose (ISOPTO TEARS / GONIOVISC) 2.5 % ophthalmic solution Place 2 drops into both eyes 4 (four) times daily as needed for dry eyes.   Unknown   ipratropium-albuterol  (DUONEB) 0.5-2.5 (3) MG/3ML SOLN Take 3 mLs by  nebulization every 6 (six) hours as needed (wheezing).   Unknown   melatonin 10 MG TABS Take 10 mg by mouth at bedtime.   03/01/2024 Bedtime   metFORMIN  (GLUCOPHAGE -XR) 500 MG 24 hr tablet Take 500 mg by mouth daily.   03/02/2024 Morning   modafinil  (PROVIGIL ) 100 MG tablet Take 1 tablet (100 mg total) by mouth daily. 10 tablet 0 03/02/2024 Morning   montelukast  (SINGULAIR ) 10 MG tablet Take 10 mg by mouth at bedtime.   03/01/2024 Bedtime   omeprazole (PRILOSEC) 20 MG capsule Take 20 mg by mouth daily.   03/02/2024 Morning   polyethylene glycol (MIRALAX  / GLYCOLAX ) 17 g packet Take 17 g by mouth daily as needed.   Unknown   propranolol  (INDERAL ) 20 MG tablet Take 20 mg by mouth 2 (two) times daily.   03/02/2024 Morning   sennosides (SENOKOT) 8.8 MG/5ML syrup Take 5 mLs by mouth at bedtime.   03/01/2024 Bedtime   [EXPIRED] tamsulosin  (FLOMAX ) 0.4 MG CAPS capsule Take 0.4 mg by mouth daily after breakfast.   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  Tobacco Use   Smoking status: Never   Smokeless tobacco: Never  Vaping Use   Vaping status: Never Used  Substance and Sexual Activity   Alcohol  use: No    Alcohol /week: 0.0 standard drinks of alcohol    Drug use: No   Sexual activity: Not on file  Other Topics Concern   Not on file  Social History Narrative   Not on file   Social Drivers of Health   Tobacco Use: Low Risk (12/09/2023)   Patient History    Smoking Tobacco Use: Never    Smokeless Tobacco Use: Never    Passive Exposure: Not on file  Financial Resource Strain: High Risk (10/07/2023)   Received from [facility]   Overall Financial Resource Strain (CARDIA)    How hard is it for you to pay for the very basics like food, housing, medical care, and heating?: Very hard  Food Insecurity: No Food Insecurity (03/03/2024)   Epic    Worried About Radiation Protection Practitioner of Food in the Last Year: Never true    Ran Out of Food in the Last Year: Never true  Transportation Needs: No Transportation Needs (03/03/2024)   Epic    Lack of Transportation (Medical): No    Lack of Transportation (Non-Medical): No  Physical Activity: Not on file  Stress: Not on file  Social Connections: Moderately Integrated (03/03/2024)   Social Connection and Isolation Panel    Frequency of Communication with Friends and Family: Three times a week    Frequency of Social Gatherings with Friends and Family: Three times a week    Attends Religious Services: More than 4 times per year    Active Member of Clubs or Organizations: Yes    Attends Banker Meetings: 1 to 4 times per year    Marital Status: Widowed  Intimate Partner Violence: Not At Risk (03/03/2024)   Epic    Fear of Current or Ex-Partner: No    Emotionally Abused: No    Physically Abused: No    Sexually Abused: No  Depression (PHQ2-9): Not on file  Alcohol  Screen: Not on file  Housing: Low Risk (03/03/2024)   Epic    Unable to Pay for Housing in the Last Year: No    Number of Times Moved in the Last Year: 0    Homeless in the Last Year: No  Utilities: Not At Risk (03/03/2024)   Epic    Threatened with loss of utilities: No  Health Literacy: Not on file    Family History  Problem Relation Age of Onset   Diabetes Mother    Hypertension Mother    Alzheimer's disease Mother    Heart disease Father    Diabetes Sister      Vitals:   03/07/24 1940 03/08/24 0053 03/08/24 0517 03/08/24 0933  BP: (!) 111/59 (!) 145/54 (!) 129/59 (!) 145/66  Pulse: 84 72 73 73  Resp: 20 20 20  (!) 22  Temp: 97.9 F (36.6 C) 97.8 F (36.6 C) 97.9 F (36.6 C)   TempSrc:       SpO2: 91% 98% 98% 100%  Weight:        PHYSICAL EXAM General: Chronically ill-appearing elderly male, well nourished, in no acute distress. HEENT: Normocephalic and atraumatic. Neck: No JVD.  Lungs: Normal respiratory effort on room air. Clear bilaterally to auscultation. No wheezes, crackles, rhonchi.  Heart: HRRR. Normal S1 and S2 without gallops or murmurs.  Abdomen: Non-distended appearing.  Msk: Normal strength and tone for age. Extremities: Warm and well perfused. No clubbing, cyanosis.  No edema.  Neuro: Alert and oriented X 3. Psych: Answers questions appropriately.   Labs: Basic Metabolic Panel: Recent Labs    03/06/24 0454 03/07/24 0551 03/08/24 0427  NA 134* 136 136  K 4.7 5.3* 5.3*  CL 102 103 103  CO2 28 28 28   GLUCOSE 144* 146* 176*  BUN 65* 64* 61*  CREATININE 1.88* 1.82* 1.83*  CALCIUM  8.2* 8.2* 8.2*  MG 2.6*  --   --    Liver Function Tests: No results for input(s): AST, ALT, ALKPHOS, BILITOT, PROT, ALBUMIN  in the last 72 hours. No results for input(s): LIPASE, AMYLASE in the last 72 hours.  CBC: Recent Labs    03/07/24 0551 03/08/24 0427  WBC 5.4 5.7  HGB 8.1* 8.2*  HCT 24.8* 25.0*  MCV 98.0 97.3  PLT 146* 170   Cardiac Enzymes: No results for input(s): CKTOTAL, CKMB, CKMBINDEX, TROPONINIHS in the last 72 hours. BNP: No results for input(s): BNP in the last 72 hours. D-Dimer: No results for input(s): DDIMER in the last 72 hours.  Hemoglobin A1C: No results for input(s): HGBA1C in the last 72 hours. Fasting Lipid Panel: No results for input(s): CHOL, HDL, LDLCALC, TRIG, CHOLHDL, LDLDIRECT in the last 72 hours. Thyroid  Function Tests: No results for input(s): TSH, T4TOTAL,  T3FREE, THYROIDAB in the last 72 hours.  Invalid input(s): FREET3 Anemia Panel: No results for input(s): VITAMINB12, FOLATE, FERRITIN, TIBC, IRON , RETICCTPCT in the last 72 hours.   Radiology:  ECHOCARDIOGRAM COMPLETE Result Date: 03/05/2024    ECHOCARDIOGRAM REPORT   Patient Name:   Jack White Date of Exam: 03/04/2024 Medical Rec #:  [id number]       Height:       75.0 in Accession #:    [id number]      Weight:       205.2 lb Date of Birth:  January 29, 1941      BSA:          2.219 m Patient Age:    83 years        BP:           95/53 mmHg Patient Gender: M               HR:           56 bpm. Exam Location:  [facility] Procedure: 2D Echo, Cardiac Doppler, Color Doppler and Strain Analysis (Both            Spectral and Color Flow Doppler were utilized during procedure). Indications:     Chest pain R07.9  History:         Patient has no prior history of Echocardiogram examinations.                  Previous Myocardial Infarction; Risk Factors:Hypertension.  Sonographer:     Christopher Furnace Referring Phys:  [id number] DELAYNE LULLA SOLIAN Diagnosing Phys: Cara JONETTA Lovelace MD  Sonographer Comments: Suboptimal parasternal window. Global longitudinal strain was attempted. IMPRESSIONS  1. Left ventricular ejection fraction, by estimation, is 55 to 60%. The left ventricle has normal function. The left ventricle has no regional wall motion abnormalities. There is moderate concentric left ventricular hypertrophy. Left ventricular diastolic parameters are consistent with Grade I diastolic dysfunction (impaired relaxation). The average left ventricular global longitudinal strain is 16.3 %. The global longitudinal strain is normal.  2. Right ventricular systolic function is normal. The right ventricular size is normal.  3. The mitral valve is normal in structure. Trivial mitral valve regurgitation.  4. The aortic valve is normal in structure. Aortic valve regurgitation is not visualized. Aortic valve sclerosis/calcification is present, without any evidence of aortic stenosis. FINDINGS  Left Ventricle: Left ventricular ejection fraction, by estimation, is 55 to 60%. The left ventricle has normal function. The left ventricle has no regional  wall motion abnormalities. The average left ventricular global longitudinal strain is 16.3 %. Strain was performed and the global longitudinal strain is normal. The left ventricular internal cavity size was normal in size. There is moderate concentric left ventricular hypertrophy. Left ventricular diastolic parameters are consistent with Grade I diastolic dysfunction (impaired relaxation). Right Ventricle: The right ventricular size is normal. No increase in right ventricular wall thickness. Right ventricular systolic function is normal. Left Atrium: Left atrial size was normal in size. Right Atrium: Right atrial size was normal in size. Pericardium: There is no evidence of pericardial effusion. Mitral Valve: The mitral valve is normal in structure. Trivial mitral valve regurgitation. MV peak gradient, 4.2 mmHg. The mean mitral valve gradient is 1.0 mmHg. Tricuspid Valve: The tricuspid valve is normal in structure. Tricuspid valve regurgitation is trivial. Aortic Valve: The aortic valve is normal in structure. Aortic valve regurgitation is not visualized. Aortic valve sclerosis/calcification is present, without any evidence of aortic stenosis. Aortic  valve mean gradient measures 4.0 mmHg. Aortic valve peak  gradient measures 6.9 mmHg. Aortic valve area, by VTI measures 1.86 cm. Pulmonic Valve: The pulmonic valve was normal in structure. Pulmonic valve regurgitation is not visualized. Aorta: The aortic root was not well visualized. IAS/Shunts: No atrial level shunt detected by color flow Doppler. Additional Comments: 3D was performed not requiring image post processing on an independent workstation and was indeterminate.  LEFT VENTRICLE PLAX 2D LVIDd:         4.30 cm   Diastology LVIDs:         3.00 cm   LV e' medial:    4.79 cm/s LV PW:         1.40 cm   LV E/e' medial:  13.0 LV IVS:        1.50 cm   LV e' lateral:   8.59 cm/s LVOT diam:     2.20 cm   LV E/e' lateral: 7.3 LV SV:         53 LV SV Index:   24         2D Longitudinal Strain LVOT Area:     3.80 cm  2D Strain GLS (A4C):   18.0 % LV IVRT:       108 msec  2D Strain GLS (A3C):   16.2 %                          2D Strain GLS (A2C):   14.8 %                          2D Strain GLS Avg:     16.3 % RIGHT VENTRICLE RV Basal diam:  4.10 cm RV Mid diam:    3.30 cm RV S prime:     13.10 cm/s TAPSE (M-mode): 2.0 cm LEFT ATRIUM             Index        RIGHT ATRIUM           Index LA diam:        3.10 cm 1.40 cm/m   RA Area:     21.60 cm LA Vol (A2C):   23.1 ml 10.41 ml/m  RA Volume:   63.50 ml  28.62 ml/m LA Vol (A4C):   25.3 ml 11.40 ml/m LA Biplane Vol: 25.2 ml 11.36 ml/m  AORTIC VALVE AV Area (Vmax):    1.91 cm AV Area (Vmean):   1.83 cm AV Area (VTI):     1.86 cm AV Vmax:           131.67 cm/s AV Vmean:          93.933 cm/s AV VTI:            0.284 m AV Peak Grad:      6.9 mmHg AV Mean Grad:      4.0 mmHg LVOT Vmax:         66.30 cm/s LVOT Vmean:        45.300 cm/s LVOT VTI:          0.139 m LVOT/AV VTI ratio: 0.49  AORTA Ao Root diam: 3.00 cm MITRAL VALVE               TRICUSPID VALVE MV Area (PHT): 1.85 cm    TR Peak grad:   12.0 mmHg MV Area VTI:   1.87 cm    TR Vmax:  173.00 cm/s MV Peak grad:  4.2 mmHg MV Mean grad:  1.0 mmHg    SHUNTS MV Vmax:       1.02 m/s    Systemic VTI:  0.14 m MV Vmean:      55.6 cm/s   Systemic Diam: 2.20 cm MV Decel Time: 410 msec MV E velocity: 62.40 cm/s MV A velocity: 91.20 cm/s MV E/A ratio:  0.68 Dwayne JONETTA Lovelace MD Electronically signed by Cara JONETTA Lovelace MD Signature Date/Time: 03/05/2024/2:51:30 PM    Final    NM Myocar Multi W/Spect Marisela Motion / EF Result Date: 03/03/2024   Findings are consistent with ischemia. The study is high risk.   No ST deviation was noted.   LV perfusion is abnormal. There is evidence of ischemia. There is no evidence of infarction. Defect 1: There is a medium defect with moderate reduction in uptake present in the mid to basal inferolateral and lateral location(s) that is reversible.  There is normal wall motion in the defect area. Consistent with ischemia.   Left ventricular function is normal. End diastolic cavity size is normal.   CT images were obtained for attenuation correction and were examined for the presence of coronary calcium  when appropriate. Conclusion Abnormal myocardial perfusion scan large area of lateral lateral basal evidence of ischemia moderate intensity Ejection fraction normal around 58% with normal wall motion Consider further evaluation possibly invasively to rule out significant obstructive disease   CT Angio Chest PE W/Cm &/Or Wo Cm Result Date: 03/03/2024 EXAM: CTA of the Chest with contrast for PE 03/03/2024 12:26:56 AM TECHNIQUE: CTA of the chest was performed without and with the administration of 75 mL of iohexol  (OMNIPAQUE ) 350 MG/ML injection. Multiplanar reformatted images are provided for review. MIP images are provided for review. Automated exposure control, iterative reconstruction, and/or weight based adjustment of the mA/kV was utilized to reduce the radiation dose to as low as reasonably achievable. COMPARISON: 07/27/2023 CLINICAL HISTORY: Pulmonary embolism (PE) suspected, low to intermediate prob, positive D-dimer. FINDINGS: PULMONARY ARTERIES: Pulmonary arteries are adequately opacified for evaluation. No pulmonary embolism. Main pulmonary artery is normal in caliber. MEDIASTINUM: Coronary artery and aortic atherosclerosis. LYMPH NODES: No mediastinal, hilar or axillary lymphadenopathy. LUNGS AND PLEURA: Scarring in the lingula and right middle lobe. No acute confluent airspace opacities. No pulmonary edema. No pleural effusion or pneumothorax. UPPER ABDOMEN: Limited images of the upper abdomen are unremarkable. SOFT TISSUES AND BONES: No acute bone or soft tissue abnormality. IMPRESSION: 1. No pulmonary embolism. 2. No acute pulmonary abnormality. 3. Coronary artery disease. Aortic atherosclerosis. Electronically signed by: Franky Crease MD 03/03/2024  12:33 AM EST RP Workstation: [id number]   CT Head Wo Contrast Result Date: 03/02/2024 CLINICAL DATA:  Fall 2 weeks ago.  Episode of emesis today. EXAM: CT HEAD WITHOUT CONTRAST TECHNIQUE: Contiguous axial images were obtained from the base of the skull through the vertex without intravenous contrast. RADIATION DOSE REDUCTION: This exam was performed according to the departmental dose-optimization program which includes automated exposure control, adjustment of the mA and/or kV according to patient size and/or use of iterative reconstruction technique. COMPARISON:  09/19/2023, 07/21/2023 FINDINGS: Brain: Ventricles, cisterns and other CSF spaces are unchanged and within normal. There is minimal chronic ischemic microvascular disease present. No mass, mass effect, shift of midline structures or acute hemorrhage. Postsurgical/posttraumatic sequelae with thickening/calcification of the right frontal subdural region unchanged from 09/19/2023. This directly underlies the right frontal craniotomy defect. Vascular: No hyperdense vessel or unexpected calcification. Skull: Evidence of previous  right frontal craniectomy. Sinuses/Orbits: Hypoplastic frontal sinuses as the paranasal sinuses are otherwise clear. Mastoid air cells are clear. Orbits are normal. Other: None. IMPRESSION: 1. No acute findings. 2. Minimal chronic ischemic microvascular disease. 3. Previous right frontal craniotomy with stable underlying postsurgical changes. Electronically Signed   By: Toribio Agreste M.D.   On: 03/02/2024 16:31   DG Chest 2 View Result Date: 03/02/2024 EXAM: 2 VIEW(S) XRAY OF THE CHEST 03/02/2024 03:51:00 PM COMPARISON: 08/31/2023 CLINICAL HISTORY: CP FINDINGS: LUNGS AND PLEURA: Low lung volumes. No focal pulmonary opacity. No pleural effusion. No pneumothorax. HEART AND MEDIASTINUM: Cardiomegaly. Tortuous thoracic aorta. Atherosclerotic calcifications. BONES AND SOFT TISSUES: Multilevel degenerative changes of thoracic spine. No  acute osseous abnormality. IMPRESSION: 1. No acute findings. 2. Cardiomegaly. Electronically signed by: Greig Pique MD 03/02/2024 04:29 PM EST RP Workstation: [id number]    ECHO as above  TELEMETRY (personally reviewed): Sinus rhythm rate 70s  EKG (personally reviewed): Normal sinus rhythm rate 63 bpm  Data reviewed by me 03/08/2024: last 24h vitals tele labs imaging I/O ED provider note, admission H&P, hospitalist progress note  Principal Problem:   Precordial chest pain Active Problems:   Stage 3a chronic kidney disease (HCC)   Diabetes mellitus, type 2 (HCC)   Hypertension   Hyperkalemia   Parkinson disease (HCC)   Trigeminal neuralgia   BPH (benign prostatic hyperplasia)   History of traumatic subdural hematoma s/p craniotomy   Chest pain   Thrombocytopenia   Unstable angina (HCC)    ASSESSMENT AND PLAN:  Jack White is a 84 y.o. male  with a past medical history of hypertension, type 2 diabetes, CKD stage IIIa, BPH, history of traumatic subdural hematoma s/p craniotomy who presented to the ED on 03/02/2024 for shortness of breath and chest pain. Cardiology was consulted for further evaluation.   # Coronary artery disease s/p DES # Hypertension # Chronic kidney disease stage IIIa Patient presented with complaints of shortness of breath and chest discomfort after an episode of vomiting yesterday.  Troponins are minimal at 23 and 24.  Chest x-ray without acute abnormality.  EKG without acute ischemic changes.  Overall feeling improved today. Nuclear stress 03/03/24 with large area of lateral basal evidence of ischemia moderate intensity.  LHC 03/04/2024 revealed mid LAD lesion s/p DES.  Echo this admission with EF 55-60%, no wall motion abnormalities, moderate concentric LVH, grade 1 diastolic dysfunction.  - Increase atorvastatin  to 40 mg daily.  Continue aspirin  81 mg daily, Plavix  75 mg daily for DAPT. - Continue propranolol  20 mg twice daily.  - Currently pending further PT  eval, possible SNF placement.   This patient's plan of care was discussed and created with Dr. Florencio and he is in agreement.  Signed: Danita Bloch, PA-C  03/08/2024, 10:13 AM [facility] Cardiology      "

## 2024-03-08 NOTE — Progress Notes (Addendum)
" °  Progress Note   Patient: Jack White FMW:[id number] DOB: 1941-02-16 DOA: 03/02/2024     4 DOS: the patient was seen and examined on 03/08/2024   Brief hospital course: Jack White is a 84 y.o. 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  Patient has elevated D-dimer, CT angiogram was negative for PE. Patient also had a stress nuclear stress test, was positive for ischemia.  Patient had a heart cath performed on 1/8, placed LAD drug-eluting stent.   Principal Problem:   Precordial chest pain Active Problems:   Stage 3a chronic kidney disease (HCC)   Diabetes mellitus, type 2 (HCC)   Hypertension   Hyperkalemia   Parkinson disease (HCC)   Trigeminal neuralgia   BPH (benign prostatic hyperplasia)   History of traumatic subdural hematoma s/p craniotomy   Chest pain   Thrombocytopenia   Unstable angina (HCC)   Assessment and Plan: Unstable angina with coronary disease Minimal elevation troponin. Negative for PE, positive nuclear stress test.   Heart cath showed 95% stenosis in the LAD.  Status post drug-eluting stent, will continue dual antiplatelet treatment. Condition has improved.   Acute blood loss anemia. Thrombocytopenia. Hemoglobin dropped down to 8.4, he has some bleeding from catheter site. Recent iron  and B12 levels are normal. Hemoglobin has been stabilized.  Platelets are now normal.   Stage 3a chronic kidney disease (HCC) Hyperkalemia Hyponatremia. Potassium still high at 5.3 after Lokelma  yesterday, give another dose of Lokelma .  Renal function still not at baseline, patient does not want IVF, encourage fluid intake.    History of traumatic subdural hematoma s/p craniotomy No acute issues   BPH (benign prostatic hyperplasia) Continue tamsulosin    Parkinson disease  (HCC) Trigeminal neuralgia Continue gabapentin  and carbamazepine    Hypertension Continue home meds   Diabetes mellitus, type 2 (HCC) Sliding scale insulin  coverage   Generalized weakness. PT/OT recommended nursing home placement. Seen by PT again, seems to have more leg weakness. Obtain CT head.       Subjective:  Patient had some confusion woke up from sleep.  Otherwise doing well.  Physical Exam: Vitals:   03/07/24 1940 03/08/24 0053 03/08/24 0517 03/08/24 0933  BP: (!) 111/59 (!) 145/54 (!) 129/59 (!) 145/66  Pulse: 84 72 73 73  Resp: 20 20 20  (!) 22  Temp: 97.9 F (36.6 C) 97.8 F (36.6 C) 97.9 F (36.6 C)   TempSrc:      SpO2: 91% 98% 98% 100%  Weight:       General exam: Appears calm and comfortable  Respiratory system: Clear to auscultation. Respiratory effort normal. Cardiovascular system: S1 & S2 heard, RRR. No JVD, murmurs, rubs, gallops or clicks. No pedal edema. Gastrointestinal system: Abdomen is nondistended, soft and nontender. No organomegaly or masses felt. Normal bowel sounds heard. Central nervous system: Alert and oriented x2. No focal neurological deficits. Extremities: Symmetric 5 x 5 power. Skin: No rashes, lesions or ulcers Psychiatry: Mood & affect appropriate.    Data Reviewed:  Lab results reviewed.  Family Communication: None  Disposition: Status is: Inpatient Remains inpatient appropriate because: Severity of disease, IV treatment and pending placement     Time spent: 35 minutes  Author: Murvin Mana, MD 03/08/2024 10:25 AM  For on call review [email or web address]    "

## 2024-03-08 NOTE — TOC Progression Note (Signed)
 Transition of Care [facility]) - Progression Note    Patient Details  Name: Jack White MRN: [id number] Date of Birth: Mar 30, 1940  [facility] CM/SW Contact  Lauraine [name], LCSW Phone Number: 03/08/2024, 11:46 AM  Clinical Narrative:   CSW met with patient to discuss SNF placement. He called daughter for CSW to speak with. Daughter provided information on hospitalization and SNF stay this past summer. He went to [facility] in July. Daughter does not want patient to return to [facility] due to previous care concerns. Discussed current SNF recommendation. Patient had a cardiac procedure Friday afternoon and it takes an extended period of time for medications to leave his system. She did not feel like he was able to fully work with PT over the weekend because of this. Per therapy recommendations, she requests that he sit in the recliner a couple of times per day and she has asked patient to complete arm and leg exercises when not working with therapy. CSW asked PT and OT to re-evaluate and to see him in the afternoons because this is when he is most awake. Daughter feels like they can manage his needs at home as long as he can stand, take 3-4 steps, and pivot. She stated she used a gait belt to get him up this weekend and he was able to stand for about 10 seconds. Will discuss with daughter again after PT and OT have seen him.  Expected Discharge Plan: Home/Self Care Barriers to Discharge: Continued Medical Work up               Expected Discharge Plan and Services     Post Acute Care Choice: NA Living arrangements for the past 2 months: Single Family Home                                       Social Drivers of Health (SDOH) Interventions SDOH Screenings   Food Insecurity: No Food Insecurity (03/03/2024)  Housing: Low Risk (03/03/2024)  Transportation Needs: No Transportation Needs (03/03/2024)  Utilities: Not At Risk (03/03/2024)  Financial Resource  Strain: High Risk (10/07/2023)   Received from [facility]  Social Connections: Moderately Integrated (03/03/2024)  Tobacco Use: Low Risk (12/09/2023)    Readmission Risk Interventions    03/05/2024   12:58 PM  Readmission Risk Prevention Plan  Transportation Screening Complete  PCP or Specialist Appt within 3-5 Days Complete  Social Work Consult for Recovery Care Planning/Counseling Complete  Palliative Care Screening Not Applicable  Medication Review Oceanographer) Complete

## 2024-03-08 NOTE — Progress Notes (Signed)
 Assessed patient's skin tear on sacrum.  No dressing in place but sacrum is clean and dry.  Patient expressed there is some stinging in that area which I told him was expected.  Patient was helped to the chair and put additional padding/pillows underneath him for comfort.

## 2024-03-08 NOTE — Progress Notes (Signed)
 Patient is minimally arousable. VSS.  Patient asked to open eyes and he just responded no and went back to sleep.  Spoke to Dr. Laurita and he says patient has done this 3 times now.  Will continue to monitor.

## 2024-03-08 NOTE — Progress Notes (Signed)
 MD ordered IVF but patient is concerned about restarting NS because he had a reaction in his left arm last time they started NS.  Contacted MD and he said he would d/c fluids and encourage patient to drink fluids today.

## 2024-03-08 NOTE — Progress Notes (Signed)
 Patients daughter requesting new nurse.  All dayshift medications given prior to this request  Patient comfortable sitting in the chair.  Kayla, RN to take over any additional care needed for shift.

## 2024-03-08 NOTE — Progress Notes (Signed)
 Occupational Therapy Treatment Patient Details Name: Jack White MRN: [id number] DOB: December 03, 1940 Today's Date: 03/08/2024   History of present illness Pt is an 84 y/o M admitted on 03/02/24 after presenting with c/o chest pain & vomiting. Pt with positive nuclear stress test, s/p heart cath with PCI & stent 03/04/24. PMH: HTN, DM, CKD 3A, possible Parkinson's & trigeminal neuralgia, BPH, craniotomy 2/2 traumatic SDH   OT comments  Chart reviewed to date, pt greeted semi supine in bed, finishing lunch, agreeable to OT tx session targeting improving functional activity tolerance in prep for ADL tasks. Progress is noted on this date with pt able to feed himself with supervision. He is tremulous at rest throughout BUE however. Will continue assess for need for AE. Pt performs STS in stedy (from posterior pads) 5x with MIN A +2. Static standing for approx 15 seconds each attempt. Initial MOD A +2 for STS from bed. Poor balance during weight shifting attempts with knees buckling. Pt is left in bedside chair, all needs met. OT will continue to follow acutely to facilitate optimal ADL/funcitonal mobility engagement.       If plan is discharge home, recommend the following:  Two people to help with walking and/or transfers;Two people to help with bathing/dressing/bathroom;Supervision due to cognitive status   Equipment Recommendations  Other (comment) Jayson Ip (or sit to stand lift) at this time if family discharges home)    Recommendations for Other Services      Precautions / Restrictions Precautions Precautions: Fall Recall of Precautions/Restrictions: Impaired Restrictions Weight Bearing Restrictions Per Provider Order: No       Mobility Bed Mobility Overal bed mobility: Needs Assistance Bed Mobility: Supine to Sit     Supine to sit: Max assist, HOB elevated, Used rails          Transfers Overall transfer level: Needs assistance Equipment used: Ambulation equipment  used Transfers: Sit to/from Stand Sit to Stand: Min assist, +2 physical assistance, From elevated surface           General transfer comment: pt stands 5x with STedy with MIN A +2 from elevated poseriod pads. MOD A +2 from bed for initial STS in [place]. Poor weight shifting when attempted.     Balance Overall balance assessment: Needs assistance Sitting-balance support: Feet supported Sitting balance-Leahy Scale: Fair     Standing balance support: During functional activity, Bilateral upper extremity supported, Reliant on assistive device for balance Standing balance-Leahy Scale: Poor Standing balance comment: standing standing for approx 15 seconds 5 attempts, in stedy for approx 8 minutes                           ADL either performed or assessed with clinical judgement   ADL Overall ADL's : Needs assistance/impaired Eating/Feeding: Supervision/ safety;Sitting Eating/Feeding Details (indicate cue type and reason): no noted deficits with regular cutlery, will continue to assess                 Lower Body Dressing: Maximal assistance;Sitting/lateral leans Lower Body Dressing Details (indicate cue type and reason): donn shoes Toilet Transfer: Maximal assistance;+2 for physical assistance;Total assistance Toilet Transfer Details (indicate cue type and reason): via stedy                Extremity/Trunk Assessment              Vision       Perception     Praxis     Communication  Communication Communication: Impaired Factors Affecting Communication: Hearing impaired   Cognition Arousal: Alert Behavior During Therapy: Flat affect Cognition: Cognition impaired           Executive functioning impairment (select all impairments): Reasoning, Problem solving OT - Cognition Comments: will continue to assess                 Following commands: Impaired Following commands impaired: Follows one step commands with increased time       Cueing   Cueing Techniques: Verbal cues, Tactile cues, Visual cues, Gestural cues  Exercises Other Exercises Other Exercises: edu re role of OT, role of rehab    Shoulder Instructions       General Comments vss    Pertinent Vitals/ Pain       Pain Assessment Pain Assessment: Faces Faces Pain Scale: Hurts a little bit Pain Location: bottom Pain Descriptors / Indicators: Discomfort Pain Intervention(s): Monitored during session, Repositioned  Home Living                                          Prior Functioning/Environment              Frequency  Min 2X/week        Progress Toward Goals  OT Goals(current goals can now be found in the care plan section)  Progress towards OT goals: Progressing toward goals  Acute Rehab OT Goals Time For Goal Achievement: 03/20/24  Plan      Co-evaluation                 AM-PAC OT 6 Clicks Daily Activity     Outcome Measure   Help from another person eating meals?: None Help from another person taking care of personal grooming?: A Little Help from another person toileting, which includes using toliet, bedpan, or urinal?: A Lot Help from another person bathing (including washing, rinsing, drying)?: A Lot Help from another person to put on and taking off regular upper body clothing?: A Little Help from another person to put on and taking off regular lower body clothing?: A Lot 6 Click Score: 16    End of Session Equipment Utilized During Treatment: Gait belt;Other (comment) (stedy)  OT Visit Diagnosis: Other abnormalities of gait and mobility (R26.89);Muscle weakness (generalized) (M62.81)   Activity Tolerance Patient tolerated treatment well   Patient Left in chair;with call bell/phone within reach;with chair alarm set;with nursing/sitter in room   Nurse Communication Mobility status        Time: 8555-8494 OT Time Calculation (min): 21 min  Charges: OT General Charges $OT Visit: 1  Visit OT Treatments $Therapeutic Activity: 8-22 mins  Therisa Sheffield, OTD OTR/L  03/08/2024, 3:13 PM

## 2024-03-09 ENCOUNTER — Encounter: Payer: Self-pay | Admitting: Internal Medicine

## 2024-03-09 DIAGNOSIS — D508 Other iron deficiency anemias: Secondary | ICD-10-CM

## 2024-03-09 DIAGNOSIS — N1831 Chronic kidney disease, stage 3a: Secondary | ICD-10-CM | POA: Diagnosis not present

## 2024-03-09 DIAGNOSIS — I200 Unstable angina: Secondary | ICD-10-CM | POA: Diagnosis not present

## 2024-03-09 DIAGNOSIS — D509 Iron deficiency anemia, unspecified: Secondary | ICD-10-CM | POA: Insufficient documentation

## 2024-03-09 LAB — PREPARE RBC (CROSSMATCH)

## 2024-03-09 LAB — BASIC METABOLIC PANEL WITH GFR
Anion gap: 4 — ABNORMAL LOW (ref 5–15)
BUN: 51 mg/dL — ABNORMAL HIGH (ref 8–23)
CO2: 26 mmol/L (ref 22–32)
Calcium: 7.7 mg/dL — ABNORMAL LOW (ref 8.9–10.3)
Chloride: 105 mmol/L (ref 98–111)
Creatinine, Ser: 1.66 mg/dL — ABNORMAL HIGH (ref 0.61–1.24)
GFR, Estimated: 40 mL/min — ABNORMAL LOW
Glucose, Bld: 174 mg/dL — ABNORMAL HIGH (ref 70–99)
Potassium: 5.5 mmol/L — ABNORMAL HIGH (ref 3.5–5.1)
Sodium: 136 mmol/L (ref 135–145)

## 2024-03-09 LAB — POTASSIUM: Potassium: 5.2 mmol/L — ABNORMAL HIGH (ref 3.5–5.1)

## 2024-03-09 LAB — IRON AND TIBC
Iron: 42 ug/dL — ABNORMAL LOW (ref 45–182)
Saturation Ratios: 14 % — ABNORMAL LOW (ref 17.9–39.5)
TIBC: 298 ug/dL (ref 250–450)
UIBC: 256 ug/dL

## 2024-03-09 LAB — CBC
HCT: 22 % — ABNORMAL LOW (ref 39.0–52.0)
Hemoglobin: 7 g/dL — ABNORMAL LOW (ref 13.0–17.0)
MCH: 32 pg (ref 26.0–34.0)
MCHC: 31.8 g/dL (ref 30.0–36.0)
MCV: 100.5 fL — ABNORMAL HIGH (ref 80.0–100.0)
Platelets: 162 K/uL (ref 150–400)
RBC: 2.19 MIL/uL — ABNORMAL LOW (ref 4.22–5.81)
RDW: 13.1 % (ref 11.5–15.5)
WBC: 5.7 K/uL (ref 4.0–10.5)
nRBC: 0 % (ref 0.0–0.2)

## 2024-03-09 LAB — VITAMIN B12: Vitamin B-12: 403 pg/mL (ref 180–914)

## 2024-03-09 LAB — FERRITIN: Ferritin: 48 ng/mL (ref 24–336)

## 2024-03-09 LAB — MAGNESIUM: Magnesium: 2.8 mg/dL — ABNORMAL HIGH (ref 1.7–2.4)

## 2024-03-09 MED ORDER — FUROSEMIDE 10 MG/ML IJ SOLN: 40.0000 mg | Freq: Once | INTRAMUSCULAR | Status: DC

## 2024-03-09 MED ORDER — SODIUM ZIRCONIUM CYCLOSILICATE 10 G PO PACK
10.0000 g | PACK | Freq: Once | ORAL | Status: AC
  Administered 2024-03-09: 10 g via ORAL
  Filled 2024-03-09: qty 1

## 2024-03-09 MED ORDER — FUROSEMIDE 10 MG/ML IJ SOLN
20.0000 mg | Freq: Once | INTRAMUSCULAR | Status: AC
  Administered 2024-03-09: 20 mg via INTRAVENOUS
  Filled 2024-03-09: qty 2

## 2024-03-09 MED ORDER — POLYSACCHARIDE IRON COMPLEX 150 MG PO CAPS
150.0000 mg | ORAL_CAPSULE | Freq: Every day | ORAL | Status: DC
  Administered 2024-03-09 – 2024-03-17 (×9): 150 mg via ORAL
  Filled 2024-03-09 (×10): qty 1

## 2024-03-09 MED ORDER — SODIUM CHLORIDE 0.9% IV SOLUTION: Freq: Once | INTRAVENOUS | Status: DC

## 2024-03-09 NOTE — Care Management Important Message (Signed)
 Important Message  Patient Details  Name: Jack White MRN: [id number] Date of Birth: [birth year]   Important Message Given:  Yes - Medicare IM     Ferne Ellingwood W, CMA 03/09/2024, 11:37 AM

## 2024-03-09 NOTE — Progress Notes (Signed)
 " Progress Note   Patient: Jack White FMW:[id number] DOB: 13-Jun-1940 DOA: 03/02/2024     5 DOS: the patient was seen and examined on 03/09/2024   Brief hospital course: Jack White is a 84 y.o. 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  Patient has elevated D-dimer, CT angiogram was negative for PE. Patient also had a stress nuclear stress test, was positive for ischemia.  Patient had a heart cath performed on 1/8, placed LAD drug-eluting stent.   Principal Problem:   Precordial chest pain Active Problems:   Stage 3a chronic kidney disease (HCC)   Diabetes mellitus, type 2 (HCC)   Hypertension   Hyperkalemia   Parkinson disease (HCC)   Trigeminal neuralgia   BPH (benign prostatic hyperplasia)   History of traumatic subdural hematoma s/p craniotomy   Chest pain   Thrombocytopenia   Unstable angina (HCC)   Iron  deficiency anemia   Assessment and Plan: Unstable angina with coronary disease Minimal elevation troponin. Negative for PE, positive nuclear stress test.   Heart cath showed 95% stenosis in the LAD.  Status post drug-eluting stent, will continue dual antiplatelet treatment. Condition has improved.   Acute blood loss anemia. Iron  deficient anemia. Thrombocytopenia. Hemoglobin dropped down to 8.4, he has some bleeding from catheter site. Repeat iron  study has mild iron  patients today, start oral iron  supplement. B12 level pending. Hemoglobin dropped down to 7.0, give a transfusion of a PRBC.  20 mg Lasix  after transfusion. Patient does not have additional bleeding from catheter site, no evidence of GI bleed.  This could be from kidney function.  Continue to follow.   Stage 3a chronic kidney disease (HCC) Hyperkalemia Hyponatremia. Patient was given Lokelma  yesterday, potassium  is higher again today to 5.5, give 10 g of Lokelma  today, recheck potassium level later today.  Sodium level has normalized.  Renal function was initially worse after heart cath, is better again after fluids.   History of traumatic subdural hematoma s/p craniotomy No acute issues   BPH (benign prostatic hyperplasia) Continue tamsulosin    Parkinson disease (HCC) Trigeminal neuralgia Continue gabapentin  and carbamazepine    Hypertension Continue home meds   Diabetes mellitus, type 2 (HCC) Sliding scale insulin  coverage   Generalized weakness. PT/OT recommended nursing home placement. Seen by PT again, seems to have more leg weakness.  CT head repeated, did not show any acute changes.            Subjective:  Patient feels better today, he no longer has any confusion.  Denies any shortness of breath.  Physical Exam: Vitals:   03/08/24 2048 03/09/24 0031 03/09/24 0432 03/09/24 0913  BP: (!) 108/49 (!) 127/56 (!) 127/54 (!) 110/53  Pulse: 87 84 79 78  Resp: 20 20 20 17   Temp: 98.4 F (36.9 C) 97.9 F (36.6 C) 97.8 F (36.6 C) 98.4 F (36.9 C)  TempSrc:      SpO2: 95% 100% 100% 98%  Weight:       General exam: Appears calm and comfortable  Respiratory system: Clear to auscultation. Respiratory effort normal. Cardiovascular system: S1 & S2 heard, RRR. No JVD, murmurs, rubs, gallops or clicks. No pedal edema. Gastrointestinal system: Abdomen is nondistended, soft and nontender. No organomegaly or masses felt. Normal bowel sounds heard. Central nervous system: Alert and oriented. No focal neurological deficits. Extremities: Symmetric 5 x 5 power.  Skin: No rashes, lesions or ulcers Psychiatry: Judgement and insight appear normal. Mood & affect appropriate.    Data Reviewed:  CT and lab results reviewed.  Family Communication: Daughter updated over the phone  Disposition: Status is: Inpatient Remains inpatient appropriate because: Disease severity, IV  treatment.     Time spent: 50 minutes  Author: Murvin Mana, MD 03/09/2024 10:21 AM  For on call review [email or web address]    "

## 2024-03-09 NOTE — Plan of Care (Signed)
  Problem: Clinical Measurements: Goal: Ability to maintain clinical measurements within normal limits will improve Outcome: Progressing Goal: Cardiovascular complication will be avoided Outcome: Progressing   Problem: Nutrition: Goal: Adequate nutrition will be maintained Outcome: Progressing   

## 2024-03-09 NOTE — TOC Progression Note (Signed)
 Transition of Care [facility]) - Progression Note    Patient Details  Name: Jack White MRN: [id number] Date of Birth: 1941/02/22  [facility] CM/SW Contact  Corean ONEIDA Haddock, RN Phone Number: 03/09/2024, 12:31 PM  Clinical Narrative:     Per MD anticipated medical readiness 1-2 days.  Will follow up PT session to determine if patient has progressed to go home.  If SNF still indicated patient has a current PASRR [id number]  Expected Discharge Plan: Home/Self Care Barriers to Discharge: Continued Medical Work up               Expected Discharge Plan and Services     Post Acute Care Choice: NA Living arrangements for the past 2 months: Single Family Home                                       Social Drivers of Health (SDOH) Interventions SDOH Screenings   Food Insecurity: No Food Insecurity (03/03/2024)  Housing: Low Risk (03/03/2024)  Transportation Needs: No Transportation Needs (03/03/2024)  Utilities: Not At Risk (03/03/2024)  Financial Resource Strain: High Risk (10/07/2023)   Received from [facility]  Social Connections: Moderately Integrated (03/03/2024)  Tobacco Use: Low Risk (12/09/2023)    Readmission Risk Interventions    03/05/2024   12:58 PM  Readmission Risk Prevention Plan  Transportation Screening Complete  PCP or Specialist Appt within 3-5 Days Complete  Social Work Consult for Recovery Care Planning/Counseling Complete  Palliative Care Screening Not Applicable  Medication Review Oceanographer) Complete

## 2024-03-09 NOTE — Progress Notes (Signed)
 Occupational Therapy Treatment Patient Details Name: Jack White MRN: [id number] DOB: [birth year] Today's Date: 03/09/2024   History of present illness Pt is an 84 y/o M admitted on 03/02/24 after presenting with c/o chest pain & vomiting. Pt with positive nuclear stress test, s/p heart cath with PCI & stent 03/04/24. 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 Increased time for one step direction following. Pt is making progress towards goals, however continues to require significant physical assist for functional mobility with RW. MAX A +2 required for STS with knee buckling noted, BLE blocked. MAX A +2 for modified step pivot, eventual SPT to bedside chair. MOD A required for UB dressing. Pt is making progress towards goals, discharge recommendation remains appropriate. OT will continue to follow.       If plan is discharge home, recommend the following:  Two people to help with walking and/or transfers;Two people to help with bathing/dressing/bathroom;Supervision due to cognitive status   Equipment Recommendations  Other (comment) [name] (or sit to stand lift) if family chooses to discharge home)    Recommendations for Other Services      Precautions / Restrictions Precautions Precautions: Fall Recall of Precautions/Restrictions: Impaired Restrictions Weight Bearing Restrictions Per Provider Order: No       Mobility Bed Mobility Overal bed mobility: Needs Assistance Bed Mobility: Supine to Sit     Supine to sit: Min assist, HOB elevated, Used rails          Transfers Overall transfer level: Needs assistance Equipment used: Rolling walker (2 wheels) Transfers: Sit to/from Stand Sit to Stand: +2 physical assistance,  Max assist, From elevated surface (from elevated bed multiple attempts)           General transfer comment: B feet blocked     Balance Overall balance assessment: Needs assistance Sitting-balance support: Feet supported Sitting balance-Leahy Scale: Fair   Postural control: Posterior lean Standing balance support: Bilateral upper extremity supported, During functional activity, Reliant on assistive device for balance Standing balance-Leahy Scale: Poor Standing balance comment: posterior lean in static/dynamic standing tasks                           ADL either performed or assessed with clinical judgement   ADL Overall ADL's : Needs assistance/impaired     Grooming: Wash/dry face;Sitting;Set up           Upper Body Dressing : Moderate assistance;Sitting Upper Body Dressing Details (indicate cue type and reason): doff/donn gown Lower Body Dressing: Maximal assistance;Sitting/lateral leans Lower Body Dressing Details (indicate cue type and reason): donn shoes Toilet Transfer: Maximal assistance;Total assistance;+2 for physical assistance;+2 for safety/equipment Toilet Transfer Details (indicate cue type and reason): simulated to bedside chair via modified step pivot, eventually stand pivot                Extremity/Trunk Assessment              Vision       Perception     Praxis     Communication Communication Communication: Impaired Factors Affecting Communication: Hearing impaired   Cognition Arousal: Alert Behavior During Therapy: WFL for tasks assessed/performed Cognition: Cognition impaired  Attention impairment (select first level of impairment): Sustained attention Executive functioning impairment (select all impairments): Reasoning, Problem solving, Sequencing OT - Cognition Comments: will continue to assess                 Following commands: Impaired Following commands impaired: Follows one step commands with  increased time      Cueing   Cueing Techniques: Verbal cues, Tactile cues, Visual cues  Exercises Other Exercises Other Exercises: edu re role of OT, role of rehab    Shoulder Instructions       General Comments HR 100s bpm post mobility, spo2 90% on RA    Pertinent Vitals/ Pain       Pain Assessment Pain Assessment: No/denies pain  Home Living                                          Prior Functioning/Environment              Frequency  Min 2X/week        Progress Toward Goals  OT Goals(current goals can now be found in the care plan section)  Progress towards OT goals: Progressing toward goals  Acute Rehab OT Goals Time For Goal Achievement: 03/20/24  Plan      Co-evaluation                 AM-PAC OT 6 Clicks Daily Activity     Outcome Measure   Help from another person eating meals?: None Help from another person taking care of personal grooming?: None Help from another person toileting, which includes using toliet, bedpan, or urinal?: A Lot Help from another person bathing (including washing, rinsing, drying)?: A Lot Help from another person to put on and taking off regular upper body clothing?: A Little Help from another person to put on and taking off regular lower body clothing?: A Lot 6 Click Score: 17    End of Session Equipment Utilized During Treatment: Gait belt;Rolling walker (2 wheels)  OT Visit Diagnosis: Other abnormalities of gait and mobility (R26.89);Muscle weakness (generalized) (M62.81)   Activity Tolerance Patient tolerated treatment well   Patient Left in chair;with call bell/phone within reach;with chair alarm set;with nursing/sitter in room   Nurse Communication Mobility status        Time: 1439-1510 OT Time Calculation (min): 31 min  Charges: OT General Charges $OT Visit: 1 Visit OT Treatments $Therapeutic Activity: 23-37 mins  Therisa Sheffield, OTD OTR/L  03/09/2024, 3:29 PM

## 2024-03-09 NOTE — Progress Notes (Signed)
 Physical Therapy Treatment Patient Details Name: Jack White MRN: [id number] DOB: 10-06-1940 Today's Date: 03/09/2024   History of Present Illness Pt is an 84 y/o M admitted on 03/02/24 after presenting with c/o chest pain & vomiting. Pt with positive nuclear stress test, s/p heart cath with PCI & stent 03/04/24. 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 He is alert and eager to participate however does present with some cognition concerns. He states several times that at home he just pees in his depend and stays wet all night. Daughter confirmed this later in the day. Author encouraged pt/pt's daughter to consider getting pure wick system for home use to protect skin integrity.  Pt was able to exit L side of bed, stood 3 x with bed height really elevated.  Pt has severe, violent tremors/ myoclonic jerking with all movements. Tremors/ jerky movements are inconsistent in presentation and make pt extremely high fall risk. On 3rd STS at EOB, pt was able to take a few poor quality steps along EOB to [facility]. Overall pt is limited by poor activity tolerance. HgB 7.0 and will be getting blood. DC recs remain appropriate to maximize independence and safety with all ADLs.      If plan is discharge home, recommend the following: A lot of help with walking and/or transfers;A lot of help with bathing/dressing/bathroom;Assistance with cooking/housework;Assistance with feeding;Direct supervision/assist for medications management;Direct supervision/assist for financial management;Help with stairs or ramp for entrance;Assist for transportation;Supervision due to cognitive status     Equipment Recommendations  Hoyer lift;Hospital bed;Wheelchair cushion (measurements PT);Wheelchair (measurements PT);Rolling walker (2 wheels);BSC/3in1 (if pt does not DC to STR)       Precautions / Restrictions  Precautions Precautions: Fall Recall of Precautions/Restrictions: Impaired Restrictions Weight Bearing Restrictions Per Provider Order: No     Mobility  Bed Mobility Overal bed mobility: Needs Assistance Bed Mobility: Supine to Sit, Sit to Supine  Supine to sit: Mod assist, Max assist, Used rails, HOB elevated Sit to supine: Mod assist, Max assist General bed mobility comments: mod assist + increased time to roll L to short sit    Transfers Overall transfer level: Needs assistance Equipment used: Rolling walker (2 wheels) Transfers: Sit to/from Stand Sit to Stand: Max assist, From elevated surface  General transfer comment: Max assist to stand from elevated bed height to RW with author holding RW diown and blocking pt's knee from buckling    Ambulation/Gait Ambulation/Gait assistance: Min assist, Mod assist Gait Distance (Feet): 3 Feet Assistive device: Rolling walker (2 wheels) Gait Pattern/deviations: Step-to pattern Gait velocity: decreased  General Gait Details: pt did take a few lateral steps along EOB to HOB.   Balance Overall balance assessment: Needs assistance Sitting-balance support: Feet supported Sitting balance-Leahy Scale: Fair     Standing balance support: Bilateral upper extremity supported, During functional activity, Reliant on assistive device for balance Standing balance-Leahy Scale: Poor Standing balance comment: pt remains very high fall risk     Communication Communication Communication: No apparent difficulties  Cognition Arousal: Alert Behavior During Therapy: WFL for tasks assessed/performed, Anxious   PT - Cognitive impairments: Awareness, Problem solving, Safety/Judgement      PT - Cognition Comments: Pt is alert and cooperative but Following commands: Intact Following commands impaired: Follows one step commands with increased time    Cueing Cueing Techniques: Verbal cues, Tactile cues  General Comments General comments (skin  integrity, edema, etc.): Discussed pt's PLOF and what daily life looks like.      Pertinent Vitals/Pain Pain Assessment Pain Assessment: No/denies pain     PT Goals (current goals can now be found in the care plan section) Acute Rehab PT Goals Patient Stated Goal: go home vs rehab Progress towards PT goals: Progressing toward goals    Frequency    Min 2X/week       AM-PAC PT 6 Clicks Mobility   Outcome Measure  Help needed turning from your back to your side while in a flat bed without using bedrails?: A Lot Help needed moving from lying on your back to sitting on the side of a flat bed without using bedrails?: A Lot Help needed moving to and from a bed to a chair (including a wheelchair)?: Total Help needed standing up from a chair using your arms (e.g., wheelchair or bedside chair)?: Total Help needed to walk in hospital room?: Total Help needed climbing 3-5 steps with a railing? : Total 6 Click Score: 8    End of Session   Activity Tolerance: Patient tolerated treatment well Patient left: in chair;with call bell/phone within reach Nurse Communication: Mobility status PT Visit Diagnosis: Unsteadiness on feet (R26.81);Muscle weakness (generalized) (M62.81);Difficulty in walking, not elsewhere classified (R26.2);Other abnormalities of gait and mobility (R26.89)     Time: 8698-8667 PT Time Calculation (min) (ACUTE ONLY): 31 min  Charges:    $Therapeutic Activity: 23-37 mins PT General Charges $$ ACUTE PT VISIT: 1 Visit                    Rankin Essex PTA 03/09/2024, 3:00 PM

## 2024-03-09 NOTE — Plan of Care (Signed)

## 2024-03-09 NOTE — Progress Notes (Signed)
 " [facility] CARDIOLOGY PROGRESS NOTE       Patient ID: Jack White MRN: [id number] DOB/AGE: 09/27/1940 84 y.o.  Admit date: 03/02/2024 Referring Physician Dr. Delayne Solian Primary Physician Diedra Lame, MD  Primary Cardiologist none Reason for Consultation chest pain  HPI: Jack White is a 84 y.o. male  with a past medical history of hypertension, type 2 diabetes, CKD stage IIIa, BPH, history of traumatic subdural hematoma s/p craniotomy who presented to the ED on 03/02/2024 for shortness of breath and chest pain. Cardiology was consulted for further evaluation.   Interval history: -Patient seen and examined this AM, resting in hospital bed.  - Awakes to my presence and answers questions appropriately.  - Left arm bruises stable. - BP stable.  No events noted on telemetry.  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 Lucien   COLOSTOMY TAKEDOWN N/A 10/26/2014   Procedure: COLOSTOMY TAKEDOWN;  Surgeon: Elgin Laurence III, MD;  Location: [facility] ORS;  Service: General;  Laterality: N/A;   CORONARY STENT INTERVENTION N/A 03/04/2024   Procedure: CORONARY STENT INTERVENTION;  Surgeon: Florencio Cara BIRCH, MD;  Location: [facility] INVASIVE CV LAB;  Service: Cardiovascular;  Laterality: N/A;   CRANIOTOMY Right 07/21/2023   Procedure: CRANIOTOMY HEMATOMA EVACUATION SUBDURAL;  Surgeon:  Lanis Pupa, MD;  Location: [facility] OR;  Service: Neurosurgery;  Laterality: Right;   EXPLORATORY LAPAROTOMY  05/28/2014   Dr. Lucien   LAPAROSCOPIC CHOLECYSTECTOMY  05/21/2014   Dr. Laurence   LEFT HEART CATH AND CORONARY ANGIOGRAPHY N/A 03/04/2024   Procedure: LEFT HEART CATH AND CORONARY ANGIOGRAPHY;  Surgeon: Florencio Cara BIRCH, MD;  Location: [facility] INVASIVE CV LAB;  Service: Cardiovascular;  Laterality: N/A;    Medications Prior to Admission  Medication Sig Dispense Refill Last Dose/Taking   acetaminophen  (TYLENOL ) 500 MG tablet Take 500-1,000 mg by mouth 2 (two) times daily.   Unknown   atorvastatin  (LIPITOR) 10 MG tablet Take 1 tablet (10 mg total) by mouth at bedtime.   03/01/2024 Bedtime   butalbital -aspirin -caffeine  (FIORINAL) 50-325-40 MG capsule TAKE 1 CAPSULE BY MOUTH EVERY 6 HOURS AS NEEDED FOR HEADACHE   Unknown   carbamazepine  (TEGRETOL ) 200 MG tablet Take 1 tablet (200 mg total) by mouth 3 (three) times daily.   03/02/2024 Morning   diphenhydrAMINE  (BENADRYL ) 25 MG tablet Take 2 tablets (50 mg total) by mouth at bedtime.   03/01/2024 Bedtime   doxazosin  (CARDURA ) 4 MG tablet Take 4 mg by mouth daily.   03/02/2024 Morning   DULoxetine  (CYMBALTA ) 60 MG capsule Take 60 mg by mouth daily.   03/02/2024 Morning   ergocalciferol  (VITAMIN D2) 1.25 MG (50000 UT) capsule Take 50,000 Units by mouth once a week.   Past Week   ferrous sulfate  325 (65 FE) MG tablet Take 1 tablet (325 mg total) by mouth  daily with breakfast.   03/02/2024 Morning   fluticasone  (FLONASE ) 50 MCG/ACT nasal spray Place 2 sprays into both nostrils daily as needed for allergies.   Unknown   folic acid  (FOLVITE ) 1 MG tablet Take 1 tablet (1 mg total) by mouth daily.   03/02/2024 Morning   gabapentin  (NEURONTIN ) 300 MG capsule Take 300 mg by mouth at bedtime.   03/01/2024 Bedtime   guaiFENesin -dextromethorphan (ROBITUSSIN DM) 100-10 MG/5ML syrup Take 5 mLs by mouth every 4 (four) hours as needed for cough. 118 mL 0 Unknown   hydrocortisone   cream 1 % Apply 1 Application topically 3 (three) times daily as needed for itching.   Unknown   hydroxypropyl methylcellulose / hypromellose (ISOPTO TEARS / GONIOVISC) 2.5 % ophthalmic solution Place 2 drops into both eyes 4 (four) times daily as needed for dry eyes.   Unknown   ipratropium-albuterol  (DUONEB) 0.5-2.5 (3) MG/3ML SOLN Take 3 mLs by nebulization every 6 (six) hours as needed (wheezing).   Unknown   melatonin 10 MG TABS Take 10 mg by mouth at bedtime.   03/01/2024 Bedtime   metFORMIN  (GLUCOPHAGE -XR) 500 MG 24 hr tablet Take 500 mg by mouth daily.   03/02/2024 Morning   modafinil  (PROVIGIL ) 100 MG tablet Take 1 tablet (100 mg total) by mouth daily. 10 tablet 0 03/02/2024 Morning   montelukast  (SINGULAIR ) 10 MG tablet Take 10 mg by mouth at bedtime.   03/01/2024 Bedtime   omeprazole (PRILOSEC) 20 MG capsule Take 20 mg by mouth daily.   03/02/2024 Morning   polyethylene glycol (MIRALAX  / GLYCOLAX ) 17 g packet Take 17 g by mouth daily as needed.   Unknown   propranolol  (INDERAL ) 20 MG tablet Take 20 mg by mouth 2 (two) times daily.   03/02/2024 Morning   sennosides (SENOKOT) 8.8 MG/5ML syrup Take 5 mLs by mouth at bedtime.   03/01/2024 Bedtime   [EXPIRED] tamsulosin  (FLOMAX ) 0.4 MG CAPS capsule Take 0.4 mg by mouth daily after breakfast.   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  Tobacco Use   Smoking status: Never   Smokeless tobacco: Never  Vaping Use   Vaping status: Never Used  Substance and Sexual Activity   Alcohol  use: No    Alcohol /week: 0.0 standard drinks of alcohol    Drug use: No   Sexual  activity: Not on file  Other Topics Concern   Not on file  Social History Narrative   Not on file   Social Drivers of Health   Tobacco Use: Low Risk (12/09/2023)   Patient History    Smoking Tobacco Use: Never    Smokeless Tobacco Use: Never    Passive Exposure: Not on file  Financial Resource Strain: High Risk (10/07/2023)   Received from [facility]   Overall Financial Resource Strain (CARDIA)    How hard is it for you to pay for the very basics like food, housing, medical care, and heating?: Very hard  Food Insecurity: No Food Insecurity (03/03/2024)   Epic    Worried About Programme Researcher, Broadcasting/film/video in  the Last Year: Never true    Ran Out of Food in the Last Year: Never true  Transportation Needs: No Transportation Needs (03/03/2024)   Epic    Lack of Transportation (Medical): No    Lack of Transportation (Non-Medical): No  Physical Activity: Not on file  Stress: Not on file  Social Connections: Moderately Integrated (03/03/2024)   Social Connection and Isolation Panel    Frequency of Communication with Friends and Family: Three times a week    Frequency of Social Gatherings with Friends and Family: Three times a week    Attends Religious Services: More than 4 times per year    Active Member of Clubs or Organizations: Yes    Attends Banker Meetings: 1 to 4 times per year    Marital Status: Widowed  Intimate Partner Violence: Not At Risk (03/03/2024)   Epic    Fear of Current or Ex-Partner: No    Emotionally Abused: No    Physically Abused: No    Sexually Abused: No  Depression (PHQ2-9): Not on file  Alcohol  Screen: Not on file  Housing: Low Risk (03/03/2024)   Epic    Unable to Pay for Housing in the Last Year: No    Number of Times Moved in the Last Year: 0    Homeless in the Last Year: No  Utilities: Not At Risk (03/03/2024)   Epic    Threatened with loss of utilities: No  Health Literacy: Not on file    Family History  Problem Relation Age of Onset    Diabetes Mother    Hypertension Mother    Alzheimer's disease Mother    Heart disease Father    Diabetes Sister      Vitals:   03/08/24 2048 03/09/24 0031 03/09/24 0432 03/09/24 0913  BP: (!) 108/49 (!) 127/56 (!) 127/54 (!) 110/53  Pulse: 87 84 79 78  Resp: 20 20 20 17   Temp: 98.4 F (36.9 C) 97.9 F (36.6 C) 97.8 F (36.6 C) 98.4 F (36.9 C)  TempSrc:      SpO2: 95% 100% 100% 98%  Weight:        PHYSICAL EXAM General: Chronically ill-appearing elderly male, well nourished, in no acute distress. HEENT: Normocephalic and atraumatic. Neck: No JVD.  Lungs: Normal respiratory effort on room air. Clear bilaterally to auscultation. No wheezes, crackles, rhonchi.  Heart: HRRR. Normal S1 and S2 without gallops or murmurs.  Abdomen: Non-distended appearing.  Msk: Normal strength and tone for age. Extremities: Warm and well perfused. No clubbing, cyanosis.  No edema.  Neuro: Alert and oriented X 3. Psych: Answers questions appropriately.   Labs: Basic Metabolic Panel: Recent Labs    03/08/24 0427 03/09/24 0502  NA 136 136  K 5.3* 5.5*  CL 103 105  CO2 28 26  GLUCOSE 176* 174*  BUN 61* 51*  CREATININE 1.83* 1.66*  CALCIUM  8.2* 7.7*  MG  --  2.8*   Liver Function Tests: No results for input(s): AST, ALT, ALKPHOS, BILITOT, PROT, ALBUMIN  in the last 72 hours. No results for input(s): LIPASE, AMYLASE in the last 72 hours.  CBC: Recent Labs    03/08/24 0427 03/09/24 0502  WBC 5.7 5.7  HGB 8.2* 7.0*  HCT 25.0* 22.0*  MCV 97.3 100.5*  PLT 170 162   Cardiac Enzymes: No results for input(s): CKTOTAL, CKMB, CKMBINDEX, TROPONINIHS in the last 72 hours. BNP: No results for input(s): BNP in the last 72 hours. D-Dimer: No results for input(s): DDIMER  in the last 72 hours.  Hemoglobin A1C: No results for input(s): HGBA1C in the last 72 hours. Fasting Lipid Panel: No results for input(s): CHOL, HDL, LDLCALC, TRIG, CHOLHDL,  LDLDIRECT in the last 72 hours. Thyroid  Function Tests: No results for input(s): TSH, T4TOTAL, T3FREE, THYROIDAB in the last 72 hours.  Invalid input(s): FREET3 Anemia Panel: No results for input(s): VITAMINB12, FOLATE, FERRITIN, TIBC, IRON , RETICCTPCT in the last 72 hours.   Radiology: CARDIAC CATHETERIZATION Result Date: 03/08/2024   Mid LAD lesion is 95% stenosed.   A drug-eluting stent was successfully placed using a STENT ONYX FRONTIER 2.75X15.   Post intervention, there is a 0% residual stenosis.   The left ventricular systolic function is normal.   LV end diastolic pressure is normal.   The left ventricular ejection fraction is 55-65% by visual estimate.   There is no mitral valve regurgitation.   In the absence of any other complications or medical issues, we expect the patient to be ready for discharge from an interventional cardiology perspective.   Recommend uninterrupted dual antiplatelet therapy with Aspirin  81mg  daily and Clopidogrel  75mg  daily for a minimum of 12 months (ACS-Class I recommendation). Conclusion Inpatient left heart cath right radial approach because of positive imaging angina 6 French sheath placed in right radial artery Left ventriculogram showed normal left ventricular function EF of 55% Coronaries Left main large free of disease LAD large 25% proximal 95% mid focal Circumflex very large left dominant free of disease RCA small nondominant Intervention Successful PCI and stent of mid LAD with deployment of a 2.5 x 15 mm frontier Onyx postdilated with a 275 x 12 mm [place] neo up to 16 atm Lesion reduced from 95 down to 0 TIMI-3 flow maintained throughout Patient tolerated seizure well No complication Patient maintained on aspirin  and Plavix  for at least 6 months Anticipate discharge within 48 to 72 hours   CT HEAD WO CONTRAST ( ) Result Date: 03/08/2024 EXAM: CT HEAD WITHOUT CONTRAST 03/08/2024 01:58:26 PM TECHNIQUE: CT of the head was performed  without the administration of intravenous contrast. Automated exposure control, iterative reconstruction, and/or weight based adjustment of the mA/kV was utilized to reduce the radiation dose to as low as reasonably achievable. COMPARISON: None available. CLINICAL HISTORY: Neuro deficit, acute, stroke suspected Neuro deficit, acute, stroke suspected FINDINGS: BRAIN AND VENTRICLES: No acute hemorrhage. No evidence of acute infarct. 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 No hydrocephalus. No extra-axial collection. No mass effect or midline shift. Previous right frontal craniotomy with stable underlying postsurgical changes. ORBITS: No acute abnormality. SINUSES: No acute abnormality. SOFT TISSUES AND SKULL: No acute soft tissue abnormality. No skull fracture. IMPRESSION: 1. No acute intracranial abnormality. 2. Previous right frontal craniotomy with stable underlying postsurgical changes. Electronically signed by: Gilmore Molt MD MD 03/08/2024 02:38 PM EST RP Workstation: [id number]   ECHOCARDIOGRAM COMPLETE Result Date: 03/05/2024    ECHOCARDIOGRAM REPORT   Patient Name:   Jack White Date of Exam: 03/04/2024 Medical Rec #:  [id number]       Height:       75.0 in Accession #:    [id number]      Weight:       205.2 lb Date of Birth:  23-Sep-1940      BSA:          2.219 m Patient Age:    83 years        BP:  95/53 mmHg Patient Gender: M               HR:           56 bpm. Exam Location:  [facility] Procedure: 2D Echo, Cardiac Doppler, Color Doppler and Strain Analysis (Both            Spectral and Color Flow Doppler were utilized during procedure). Indications:     Chest pain R07.9  History:         Patient has no prior history of Echocardiogram examinations.                  Previous Myocardial Infarction; Risk Factors:Hypertension.  Sonographer:     Christopher Furnace Referring Phys:  [id number] DELAYNE LULLA SOLIAN Diagnosing Phys: Cara JONETTA Lovelace MD  Sonographer Comments: Suboptimal  parasternal window. Global longitudinal strain was attempted. IMPRESSIONS  1. Left ventricular ejection fraction, by estimation, is 55 to 60%. The left ventricle has normal function. The left ventricle has no regional wall motion abnormalities. There is moderate concentric left ventricular hypertrophy. Left ventricular diastolic parameters are consistent with Grade I diastolic dysfunction (impaired relaxation). The average left ventricular global longitudinal strain is 16.3 %. The global longitudinal strain is normal.  2. Right ventricular systolic function is normal. The right ventricular size is normal.  3. The mitral valve is normal in structure. Trivial mitral valve regurgitation.  4. The aortic valve is normal in structure. Aortic valve regurgitation is not visualized. Aortic valve sclerosis/calcification is present, without any evidence of aortic stenosis. FINDINGS  Left Ventricle: Left ventricular ejection fraction, by estimation, is 55 to 60%. The left ventricle has normal function. The left ventricle has no regional wall motion abnormalities. The average left ventricular global longitudinal strain is 16.3 %. Strain was performed and the global longitudinal strain is normal. The left ventricular internal cavity size was normal in size. There is moderate concentric left ventricular hypertrophy. Left ventricular diastolic parameters are consistent with Grade I diastolic dysfunction (impaired relaxation). Right Ventricle: The right ventricular size is normal. No increase in right ventricular wall thickness. Right ventricular systolic function is normal. Left Atrium: Left atrial size was normal in size. Right Atrium: Right atrial size was normal in size. Pericardium: There is no evidence of pericardial effusion. Mitral Valve: The mitral valve is normal in structure. Trivial mitral valve regurgitation. MV peak gradient, 4.2 mmHg. The mean mitral valve gradient is 1.0 mmHg. Tricuspid Valve: The tricuspid valve is  normal in structure. Tricuspid valve regurgitation is trivial. Aortic Valve: The aortic valve is normal in structure. Aortic valve regurgitation is not visualized. Aortic valve sclerosis/calcification is present, without any evidence of aortic stenosis. Aortic valve mean gradient measures 4.0 mmHg. Aortic valve peak  gradient measures 6.9 mmHg. Aortic valve area, by VTI measures 1.86 cm. Pulmonic Valve: The pulmonic valve was normal in structure. Pulmonic valve regurgitation is not visualized. Aorta: The aortic root was not well visualized. IAS/Shunts: No atrial level shunt detected by color flow Doppler. Additional Comments: 3D was performed not requiring image post processing on an independent workstation and was indeterminate.  LEFT VENTRICLE PLAX 2D LVIDd:         4.30 cm   Diastology LVIDs:         3.00 cm   LV e' medial:    4.79 cm/s LV PW:         1.40 cm   LV E/e' medial:  13.0 LV IVS:        1.50  cm   LV e' lateral:   8.59 cm/s LVOT diam:     2.20 cm   LV E/e' lateral: 7.3 LV SV:         53 LV SV Index:   24        2D Longitudinal Strain LVOT Area:     3.80 cm  2D Strain GLS (A4C):   18.0 % LV IVRT:       108 msec  2D Strain GLS (A3C):   16.2 %                          2D Strain GLS (A2C):   14.8 %                          2D Strain GLS Avg:     16.3 % RIGHT VENTRICLE RV Basal diam:  4.10 cm RV Mid diam:    3.30 cm RV S prime:     13.10 cm/s TAPSE (M-mode): 2.0 cm LEFT ATRIUM             Index        RIGHT ATRIUM           Index LA diam:        3.10 cm 1.40 cm/m   RA Area:     21.60 cm LA Vol (A2C):   23.1 ml 10.41 ml/m  RA Volume:   63.50 ml  28.62 ml/m LA Vol (A4C):   25.3 ml 11.40 ml/m LA Biplane Vol: 25.2 ml 11.36 ml/m  AORTIC VALVE AV Area (Vmax):    1.91 cm AV Area (Vmean):   1.83 cm AV Area (VTI):     1.86 cm AV Vmax:           131.67 cm/s AV Vmean:          93.933 cm/s AV VTI:            0.284 m AV Peak Grad:      6.9 mmHg AV Mean Grad:      4.0 mmHg LVOT Vmax:         66.30 cm/s LVOT  Vmean:        45.300 cm/s LVOT VTI:          0.139 m LVOT/AV VTI ratio: 0.49  AORTA Ao Root diam: 3.00 cm MITRAL VALVE               TRICUSPID VALVE MV Area (PHT): 1.85 cm    TR Peak grad:   12.0 mmHg MV Area VTI:   1.87 cm    TR Vmax:        173.00 cm/s MV Peak grad:  4.2 mmHg MV Mean grad:  1.0 mmHg    SHUNTS MV Vmax:       1.02 m/s    Systemic VTI:  0.14 m MV Vmean:      55.6 cm/s   Systemic Diam: 2.20 cm MV Decel Time: 410 msec MV E velocity: 62.40 cm/s MV A velocity: 91.20 cm/s MV E/A ratio:  0.68 Dwayne D Callwood MD Electronically signed by Cara JONETTA Lovelace MD Signature Date/Time: 03/05/2024/2:51:30 PM    Final    NM Myocar Multi W/Spect Marisela Motion / EF Result Date: 03/03/2024   Findings are consistent with ischemia. The study is high risk.   No ST deviation was noted.   LV perfusion is abnormal. There is evidence of ischemia.  There is no evidence of infarction. Defect 1: There is a medium defect with moderate reduction in uptake present in the mid to basal inferolateral and lateral location(s) that is reversible. There is normal wall motion in the defect area. Consistent with ischemia.   Left ventricular function is normal. End diastolic cavity size is normal.   CT images were obtained for attenuation correction and were examined for the presence of coronary calcium  when appropriate. Conclusion Abnormal myocardial perfusion scan large area of lateral lateral basal evidence of ischemia moderate intensity Ejection fraction normal around 58% with normal wall motion Consider further evaluation possibly invasively to rule out significant obstructive disease   CT Angio Chest PE W/Cm &/Or Wo Cm Result Date: 03/03/2024 EXAM: CTA of the Chest with contrast for PE 03/03/2024 12:26:56 AM TECHNIQUE: CTA of the chest was performed without and with the administration of 75 mL of iohexol  (OMNIPAQUE ) 350 MG/ML injection. Multiplanar reformatted images are provided for review. MIP images are provided for review.  Automated exposure control, iterative reconstruction, and/or weight based adjustment of the mA/kV was utilized to reduce the radiation dose to as low as reasonably achievable. COMPARISON: 07/27/2023 CLINICAL HISTORY: Pulmonary embolism (PE) suspected, low to intermediate prob, positive D-dimer. FINDINGS: PULMONARY ARTERIES: Pulmonary arteries are adequately opacified for evaluation. No pulmonary embolism. Main pulmonary artery is normal in caliber. MEDIASTINUM: Coronary artery and aortic atherosclerosis. LYMPH NODES: No mediastinal, hilar or axillary lymphadenopathy. LUNGS AND PLEURA: Scarring in the lingula and right middle lobe. No acute confluent airspace opacities. No pulmonary edema. No pleural effusion or pneumothorax. UPPER ABDOMEN: Limited images of the upper abdomen are unremarkable. SOFT TISSUES AND BONES: No acute bone or soft tissue abnormality. IMPRESSION: 1. No pulmonary embolism. 2. No acute pulmonary abnormality. 3. Coronary artery disease. Aortic atherosclerosis. Electronically signed by: Franky Crease MD 03/03/2024 12:33 AM EST RP Workstation: [id number]   CT Head Wo Contrast Result Date: 03/02/2024 CLINICAL DATA:  Fall 2 weeks ago.  Episode of emesis today. EXAM: CT HEAD WITHOUT CONTRAST TECHNIQUE: Contiguous axial images were obtained from the base of the skull through the vertex without intravenous contrast. RADIATION DOSE REDUCTION: This exam was performed according to the departmental dose-optimization program which includes automated exposure control, adjustment of the mA and/or kV according to patient size and/or use of iterative reconstruction technique. COMPARISON:  09/19/2023, 07/21/2023 FINDINGS: Brain: Ventricles, cisterns and other CSF spaces are unchanged and within normal. There is minimal chronic ischemic microvascular disease present. No mass, mass effect, shift of midline structures or acute hemorrhage. Postsurgical/posttraumatic sequelae with thickening/calcification of the right  frontal subdural region unchanged from 09/19/2023. This directly underlies the right frontal craniotomy defect. Vascular: No hyperdense vessel or unexpected calcification. Skull: Evidence of previous right frontal craniectomy. Sinuses/Orbits: Hypoplastic frontal sinuses as the paranasal sinuses are otherwise clear. Mastoid air cells are clear. Orbits are normal. Other: None. IMPRESSION: 1. No acute findings. 2. Minimal chronic ischemic microvascular disease. 3. Previous right frontal craniotomy with stable underlying postsurgical changes. Electronically Signed   By: Toribio Agreste M.D.   On: 03/02/2024 16:31   DG Chest 2 View Result Date: 03/02/2024 EXAM: 2 VIEW(S) XRAY OF THE CHEST 03/02/2024 03:51:00 PM COMPARISON: 08/31/2023 CLINICAL HISTORY: CP FINDINGS: LUNGS AND PLEURA: Low lung volumes. No focal pulmonary opacity. No pleural effusion. No pneumothorax. HEART AND MEDIASTINUM: Cardiomegaly. Tortuous thoracic aorta. Atherosclerotic calcifications. BONES AND SOFT TISSUES: Multilevel degenerative changes of thoracic spine. No acute osseous abnormality. IMPRESSION: 1. No acute findings. 2. Cardiomegaly. Electronically signed by: Greig Pique  MD 03/02/2024 04:29 PM EST RP Workstation: [id number]    ECHO as above  TELEMETRY (personally reviewed): Sinus rhythm rate 80s  EKG (personally reviewed): Normal sinus rhythm rate 63 bpm  Data reviewed by me 03/09/2024: last 24h vitals tele labs imaging I/O ED provider note, admission H&P, hospitalist progress note  Principal Problem:   Precordial chest pain Active Problems:   Stage 3a chronic kidney disease (HCC)   Diabetes mellitus, type 2 (HCC)   Hypertension   Hyperkalemia   Parkinson disease (HCC)   Trigeminal neuralgia   BPH (benign prostatic hyperplasia)   History of traumatic subdural hematoma s/p craniotomy   Chest pain   Thrombocytopenia   Unstable angina (HCC)    ASSESSMENT AND PLAN:  Jack White is a 84 y.o. male  with a past medical  history of hypertension, type 2 diabetes, CKD stage IIIa, BPH, history of traumatic subdural hematoma s/p craniotomy who presented to the ED on 03/02/2024 for shortness of breath and chest pain. Cardiology was consulted for further evaluation.   # Coronary artery disease s/p DES # Hypertension # Chronic kidney disease stage IIIa Patient presented with complaints of shortness of breath and chest discomfort after an episode of vomiting yesterday.  Troponins are minimal at 23 and 24.  Chest x-ray without acute abnormality.  EKG without acute ischemic changes.  Overall feeling improved today. Nuclear stress 03/03/24 with large area of lateral basal evidence of ischemia moderate intensity.  LHC 03/04/2024 revealed mid LAD lesion s/p DES.  Echo this admission with EF 55-60%, no wall motion abnormalities, moderate concentric LVH, grade 1 diastolic dysfunction.  - Continue atorvastatin  40 mg daily.  Continue aspirin  81 mg daily, Plavix  75 mg daily for DAPT. - Continue propranolol  20 mg twice daily.  - Currently pending further PT eval, possible SNF placement.   This patient's plan of care was discussed and created with Dr. Florencio and he is in agreement.  Signed: Danita Bloch, PA-C  03/09/2024, 9:42 AM [facility]      "

## 2024-03-10 DIAGNOSIS — R072 Precordial pain: Secondary | ICD-10-CM | POA: Diagnosis not present

## 2024-03-10 LAB — TYPE AND SCREEN
ABO/RH(D): O POS
Antibody Screen: NEGATIVE
Unit division: 0

## 2024-03-10 LAB — CBC
HCT: 24.5 % — ABNORMAL LOW (ref 39.0–52.0)
Hemoglobin: 8.3 g/dL — ABNORMAL LOW (ref 13.0–17.0)
MCH: 32.3 pg (ref 26.0–34.0)
MCHC: 33.9 g/dL (ref 30.0–36.0)
MCV: 95.3 fL (ref 80.0–100.0)
Platelets: 170 K/uL (ref 150–400)
RBC: 2.57 MIL/uL — ABNORMAL LOW (ref 4.22–5.81)
RDW: 13.8 % (ref 11.5–15.5)
WBC: 5.1 K/uL (ref 4.0–10.5)
nRBC: 0 % (ref 0.0–0.2)

## 2024-03-10 LAB — BPAM RBC
Blood Product Expiration Date: 202602022359
ISSUE DATE / TIME: 202601131522
Unit Type and Rh: 5100

## 2024-03-10 LAB — BASIC METABOLIC PANEL WITH GFR
Anion gap: 8 (ref 5–15)
BUN: 42 mg/dL — ABNORMAL HIGH (ref 8–23)
CO2: 25 mmol/L (ref 22–32)
Calcium: 8.3 mg/dL — ABNORMAL LOW (ref 8.9–10.3)
Chloride: 103 mmol/L (ref 98–111)
Creatinine, Ser: 1.45 mg/dL — ABNORMAL HIGH (ref 0.61–1.24)
GFR, Estimated: 47 mL/min — ABNORMAL LOW
Glucose, Bld: 173 mg/dL — ABNORMAL HIGH (ref 70–99)
Potassium: 5.3 mmol/L — ABNORMAL HIGH (ref 3.5–5.1)
Sodium: 136 mmol/L (ref 135–145)

## 2024-03-10 MED ORDER — DULOXETINE HCL 30 MG PO CPEP
60.0000 mg | ORAL_CAPSULE | Freq: Every day | ORAL | Status: DC
  Administered 2024-03-10 – 2024-03-17 (×8): 60 mg via ORAL
  Filled 2024-03-10 (×8): qty 2

## 2024-03-10 MED ORDER — SODIUM ZIRCONIUM CYCLOSILICATE 10 G PO PACK
10.0000 g | PACK | Freq: Once | ORAL | Status: AC
  Administered 2024-03-10: 10 g via ORAL
  Filled 2024-03-10: qty 1

## 2024-03-10 NOTE — [place] FL2 (Signed)
 " [place]  MEDICAID FL2 LEVEL OF CARE FORM     IDENTIFICATION  Patient Name: Jack White Birthdate: [birth year] Sex: male Admission Date (Current Location): 03/02/2024  [facility] and [place] Number:  Chiropodist and Address:  [facility], [address] [place], [place] [address]      Provider Number: [id number]  Attending Physician Name and Address:  Kandis Devaughn Sayres, MD  Relative Name and Phone Number:       Current Level of Care: Hospital Recommended Level of Care: Skilled Nursing Facility Prior Approval Number:    Date Approved/Denied:   PASRR Number: [id number]  Discharge Plan: SNF    Current Diagnoses: Patient Active Problem List   Diagnosis Date Noted   Iron  deficiency anemia 03/09/2024   Unstable angina (HCC) 03/05/2024   Thrombocytopenia 03/04/2024   Precordial chest pain 03/03/2024   History of traumatic subdural hematoma s/p craniotomy 03/03/2024   Chest pain 03/03/2024   CAP (community acquired pneumonia) 08/31/2023   Hypotension 08/31/2023   Generalized weakness 08/31/2023   Parkinson's disease (HCC) 08/31/2023   Type 2 diabetes mellitus with chronic kidney disease, without long-term current use of insulin  (HCC) 08/31/2023   Dyslipidemia 08/31/2023   Acute renal failure superimposed on stage 3a chronic kidney disease (HCC) 08/13/2023   Hyperkalemia 08/13/2023   Aspiration pneumonia (HCC) 08/13/2023   Anemia 08/13/2023   HLD (hyperlipidemia) 08/13/2023   Parkinson disease (HCC) 08/13/2023   Trigeminal neuralgia 08/13/2023   BPH (benign prostatic hyperplasia) 08/13/2023   Insomnia 08/13/2023   Moderate malnutrition 07/23/2023   Acute gastroenteritis 04/07/2023   Intractable nausea and vomiting 04/07/2023   Colostomy in place [facility]) 11/02/2014   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   S/P colostomy (HCC)    Acute cholecystitis 10/14/2014   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   Colon perforation (HCC)  10/14/2014   H/O colostomy 10/14/2014   Diabetes mellitus, type 2 (HCC) 10/14/2014   Hypertension 10/14/2014   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   [name]'s neuralgia 10/14/2014   Gangrenous cholecystitis 06/27/2014   Stage 3a chronic kidney disease (HCC) 02/15/2014   Fungal infection of toenail 07/30/2013    Orientation RESPIRATION BLADDER Height & Weight     Self, Time, Situation, Place  Normal Incontinent, External catheter Weight: 209 lb 7 oz (95 kg) Height:     BEHAVIORAL SYMPTOMS/MOOD NEUROLOGICAL BOWEL NUTRITION STATUS   (None)  (None) Continent Diet (Heart healthy)  AMBULATORY STATUS COMMUNICATION OF NEEDS Skin   Extensive Assist Verbally Bruising, Other (Comment) (Wound on left arm: No dressing listed. Wound on perineum: No dressing.)                       Personal Care Assistance Level of Assistance  Bathing, Feeding, Dressing Bathing Assistance: Maximum assistance Feeding assistance: Limited assistance Dressing Assistance: Maximum assistance     Functional Limitations Info  Sight, Speech, Hearing Sight Info: Adequate Hearing Info: Adequate Speech Info: Adequate    SPECIAL CARE FACTORS FREQUENCY  PT (By licensed PT), OT (By licensed OT)     PT Frequency: 5 x week OT Frequency: 5 x week            Contractures Contractures Info: Not present    Additional Factors Info  Code Status, Allergies Code Status Info: DNR Allergies Info: Cantaloupe (diagnostic), Dilaudid (Hydromorphone Hcl)           Current Medications (03/10/2024):  This is the current hospital active medication list Current Facility-Administered Medications  Medication Dose Route Frequency Provider Last Rate Last Admin   0.9 %  sodium chloride  infusion (Manually program via Guardrails IV Fluids)   Intravenous Once Laurita Pillion, MD       acetaminophen  (TYLENOL ) tablet 650 mg  650 mg Oral Q4H PRN Duncan, Hazel V, MD   650 mg at 03/07/24 1209   aspirin  chewable tablet 81 mg  81 mg  Oral Daily Callwood, Dwayne D, MD   81 mg at 03/10/24 0948   atorvastatin  (LIPITOR) tablet 40 mg  40 mg Oral QHS Hudson, Caralyn, PA-C   40 mg at 03/09/24 2114   carbamazepine  (TEGRETOL ) tablet 200 mg  200 mg Oral TID Duncan, Hazel V, MD   200 mg at 03/10/24 0932   clopidogrel  (PLAVIX ) tablet 75 mg  75 mg Oral Q breakfast Callwood, Dwayne D, MD   75 mg at 03/10/24 0931   diphenhydrAMINE  (BENADRYL ) capsule 25 mg  25 mg Oral QHS Zhang, Dekui, MD   25 mg at 03/09/24 2113   enoxaparin  (LOVENOX ) injection 40 mg  40 mg Subcutaneous Q24H Belue, Nathan S, RPH   40 mg at 03/10/24 0931   gabapentin  (NEURONTIN ) capsule 300 mg  300 mg Oral QHS Duncan, Hazel V, MD   300 mg at 03/09/24 2114   ipratropium-albuterol  (DUONEB) 0.5-2.5 (3) MG/3ML nebulizer solution 3 mL  3 mL Nebulization Q6H PRN Cleatus Delayne GAILS, MD   3 mL at 03/09/24 1818   iron  polysaccharides (NIFEREX) capsule 150 mg  150 mg Oral Daily Zhang, Dekui, MD   150 mg at 03/10/24 0948   melatonin tablet 5 mg  5 mg Oral QHS Zhang, Dekui, MD   5 mg at 03/09/24 2113   modafinil  (PROVIGIL ) tablet 100 mg  100 mg Oral Daily Duncan, Hazel V, MD   100 mg at 03/10/24 0931   nitroGLYCERIN  (NITROSTAT ) SL tablet 0.4 mg  0.4 mg Sublingual Q5 Min x 3 PRN Cleatus Delayne GAILS, MD       ondansetron  (ZOFRAN ) injection 4 mg  4 mg Intravenous Q6H PRN Duncan, Hazel V, MD   4 mg at 03/05/24 2222   sodium chloride  flush (NS) 0.9 % injection 3 mL  3 mL Intravenous PRN Callwood, Dwayne D, MD       tamsulosin  (FLOMAX ) capsule 0.4 mg  0.4 mg Oral QPC breakfast Cleatus Delayne GAILS, MD   0.4 mg at 03/10/24 9051     Discharge Medications: Please see discharge summary for a list of discharge medications.  Relevant Imaging Results:  Relevant Lab Results:   Additional Information SS#: [id number]  Lauraine [name], LCSW     "

## 2024-03-10 NOTE — Progress Notes (Signed)
 Physical Therapy Treatment Patient Details Name: Jack White MRN: [id number] DOB: February 19, 1941 Today's Date: 03/10/2024   History of Present Illness Pt is an 84 y/o M admitted on 03/02/24 after presenting with c/o chest pain & vomiting. Pt with positive nuclear stress test, s/p heart cath with PCI & stent 03/04/24. PMH: HTN, DM, CKD 3A, possible Parkinson's & trigeminal neuralgia, BPH, craniotomy 2/2 traumatic SDH    PT Comments  Pt was long sitting, wide awake, upon arrival. Correctly answer orientation questions today however author questions his overall awareness of situation and limitations. Several times requested to try to walk today. Session and progression continues to be severely limited by severity of myoclonic jerking/ violent tremors with attempts to mobilize. He puts forth great effort throughout session however still will benefit form +2 mechanical lift transfers for staff/patient safety. He did stand with author 4 x to RW from elevated bed height + with constant max assist. Pt unable to progress to taking more formal steps along EOB this date due to severity of posterior push/LOB in addition to severe jerking/knee buckling. Pt may benefit from bilateral knee immobilizers.  Pt has several LOB/falls back into bed with all attempts to take steps. Stand pivot perform with max-total assist from very elevated bed height. Tray table set and RN at bedside at conclusion of session. Highly recommend STR to improve safety with all mobility and transfers.    If plan is discharge home, recommend the following: Two people to help with walking and/or transfers;A lot of help with bathing/dressing/bathroom;Assistance with cooking/housework;Assistance with feeding;Direct supervision/assist for medications management;Direct supervision/assist for financial management;Assist for transportation;Help with stairs or ramp for entrance   Can travel by private vehicle     No  Equipment Recommendations  Rolling  walker (2 wheels);BSC/3in1;Hospital bed;Wheelchair (measurements PT);Wheelchair cushion (measurements PT);Other (comment) [name] if possible, otherwise will benefit from hoyer lift if unable toi get sara stedy. Pt will need all equipment needs listed if plan is to DC home from acute hospital. Highly recommend rehab prior. 24/4 assist will also be required)       Precautions / Restrictions Precautions Precautions: Fall Restrictions Weight Bearing Restrictions Per Provider Order: No     Mobility  Bed Mobility Overal bed mobility: Needs Assistance Bed Mobility: Supine to Sit  Supine to sit: Min assist, HOB elevated, Used rails  General bed mobility comments: Pt requires heavy use of bed rail + mod assist to safely achieve EOB sitting. Pt continues to have severe violent tremors/ myoclonic twitching-jerking with all attempts to mobilize. Encouraged RN staff to always have +2 assist and use lift equipment for transfers    Transfers Overall transfer level: Needs assistance Equipment used: Rolling walker (2 wheels), None Transfers: Bed to chair/wheelchair/BSC, Sit to/from Stand Sit to Stand: Max assist, From elevated surface Stand pivot transfers: Max assist, Total assist, From elevated surface  General transfer comment: Pt stood EOB 4 x to RW prior to stand pivot to recliner without use of AD. Bed height extremely elevated and author providing constant max assist to prevent fall. Pt has severe knee buckling with constant posterior push. Poor ability to correct. struggles more today with progressing to taking steps along EOB due to severity of mycolic jerking and knee buckling . Pt unexpectedly sits 3 out of 4 standing attempts. Max-total for stand pivot form elevated surface to recliner.    Ambulation/Gait Ambulation/Gait assistance: Max assist Gait Distance (Feet): 1 Feet Assistive device: Rolling walker (2 wheels) Gait Pattern/deviations: Knees buckling  General Gait Details: Pt  remains extremely high fall risk due to severity of knee buckling and myclonic jerking with attempts to take steps today. pt struggles more today than previous date. several occasions of LOB back into bed with attempts to take steps along EOB. Pt puts forth great effort but gets more frustrated today with lack on his abilities to perform desired task.    Balance Overall balance assessment: Needs assistance Sitting-balance support: Feet supported, Bilateral upper extremity supported Sitting balance-Leahy Scale: Fair Sitting balance - Comments: Pt does have posterior LOB with EOB LAQ/ strengthening exercises. Postural control: Posterior lean Standing balance support: Bilateral upper extremity supported, During functional activity, Reliant on assistive device for balance Standing balance-Leahy Scale: Poor Standing balance comment: several LOB/falls back into bed from EOB standing. Encourage use fo lift equipment for all future transfers until pt's transfers become safer       Communication Communication Communication: No apparent difficulties  Cognition Arousal: Alert Behavior During Therapy: WFL for tasks assessed/performed   PT - Cognitive impairments: Safety/Judgement, Awareness      PT - Cognition Comments: Pt does answer orientation questions appropriately however pt has poor overall safety awareness. Following commands: Intact Following commands impaired: Follows one step commands with increased time    Cueing Cueing Techniques: Verbal cues, Tactile cues         Pertinent Vitals/Pain Pain Assessment Pain Assessment: No/denies pain     PT Goals (current goals can now be found in the care plan section) Acute Rehab PT Goals Patient Stated Goal: I want to be able to walk again Progress towards PT goals: Progressing toward goals    Frequency    Min 2X/week       AM-PAC PT 6 Clicks Mobility   Outcome Measure  Help needed turning from your back to your side while in  a flat bed without using bedrails?: A Lot Help needed moving from lying on your back to sitting on the side of a flat bed without using bedrails?: A Lot Help needed moving to and from a bed to a chair (including a wheelchair)?: A Lot Help needed standing up from a chair using your arms (e.g., wheelchair or bedside chair)?: A Lot Help needed to walk in hospital room?: Total Help needed climbing 3-5 steps with a railing? : Total 6 Click Score: 10    End of Session Equipment Utilized During Treatment: Gait belt (Heavy use of gait belt during stand pivot to recliner) Activity Tolerance: Patient tolerated treatment well Patient left: in chair;with call bell/phone within reach;with chair alarm set;with nursing/sitter in room Nurse Communication: Mobility status PT Visit Diagnosis: Unsteadiness on feet (R26.81);Muscle weakness (generalized) (M62.81);Difficulty in walking, not elsewhere classified (R26.2);Other abnormalities of gait and mobility (R26.89)     Time: 9240-9176 PT Time Calculation (min) (ACUTE ONLY): 24 min  Charges:    $Therapeutic Activity: 8-22 mins $Neuromuscular Re-education: 8-22 mins PT General Charges $$ ACUTE PT VISIT: 1 Visit                    Rankin Essex PTA 03/10/2024, 9:01 AM

## 2024-03-10 NOTE — Plan of Care (Signed)
  Problem: Clinical Measurements: Goal: Respiratory complications will improve Outcome: Progressing Goal: Cardiovascular complication will be avoided Outcome: Progressing   Problem: Nutrition: Goal: Adequate nutrition will be maintained Outcome: Progressing   Problem: Elimination: Goal: Will not experience complications related to urinary retention Outcome: Progressing   Problem: Safety: Goal: Ability to remain free from injury will improve Outcome: Progressing

## 2024-03-10 NOTE — Progress Notes (Signed)
 Occupational Therapy Treatment Patient Details Name: RADFORD PEASE MRN: [id number] DOB: [birth year] Today's Date: 03/10/2024   History of present illness Pt is an 84 y/o M admitted on 03/02/24 after presenting with c/o chest pain & vomiting. Pt with positive nuclear stress test, s/p heart cath with PCI & stent 03/04/24. PMH: HTN, DM, CKD 3A, possible Parkinson's & trigeminal neuralgia, BPH, craniotomy 2/2 traumatic SDH   OT comments  Chart reviewed to date, pt greeted semi supine in bed, agreeable to OT tx session targeting improving functional activity tolerance in prep for ADL tasks. Improvements noted on this date, pt performs step pivot to bedside chair with MOD-MAX +2 via RW. Posterior lean noted throughout with step by step multi modal cues for technique. MIN A for STS from elevated bed height. Supervision for drinking from a cup and grooming tasks. Pt is left in bedside chair, all needs met. OT will continue to follow.       If plan is discharge home, recommend the following:  Two people to help with walking and/or transfers;Two people to help with bathing/dressing/bathroom;Supervision due to cognitive status   Equipment Recommendations  Other (comment) [name]/sit to stand lift if pt discharges home)    Recommendations for Other Services      Precautions / Restrictions Precautions Precautions: Fall Recall of Precautions/Restrictions: Impaired Restrictions Weight Bearing Restrictions Per Provider Order: No       Mobility Bed Mobility Overal bed mobility: Needs Assistance Bed Mobility: Supine to Sit     Supine to sit: Min assist, HOB elevated, Used rails          Transfers Overall transfer level: Needs assistance Equipment used: Rolling walker (2 wheels) Transfers: Sit to/from Stand, Bed to chair/wheelchair/BSC Sit to Stand: Min assist, +2 physical assistance, +2 safety/equipment, From elevated surface     Step pivot transfers: Mod assist, Max assist, +2  physical assistance, +2 safety/equipment     General transfer comment: step by step multi modal cues with increased time, no knee buckling noted; posterior lean throughout     Balance Overall balance assessment: Needs assistance Sitting-balance support: Feet supported, Bilateral upper extremity supported Sitting balance-Leahy Scale: Fair     Standing balance support: Bilateral upper extremity supported, During functional activity, Reliant on assistive device for balance Standing balance-Leahy Scale: Poor                             ADL either performed or assessed with clinical judgement   ADL Overall ADL's : Needs assistance/impaired Eating/Feeding: Supervision/ safety;Sitting Eating/Feeding Details (indicate cue type and reason): bring cup to mouth, cutlery NT however reported difficulties today Grooming: Wash/dry face;Sitting;Set up               Lower Body Dressing: Maximal assistance;Sitting/lateral leans Lower Body Dressing Details (indicate cue type and reason): donn shoes Toilet Transfer: Moderate assistance;Maximal assistance;Rolling walker (2 wheels) Toilet Transfer Details (indicate cue type and reason): simulated to bedside chair via step pivot with RW Toileting- Clothing Manipulation and Hygiene: Maximal assistance;Sit to/from stand Toileting - Clothing Manipulation Details (indicate cue type and reason): peri care            Extremity/Trunk Assessment              Vision       Perception     Praxis     Communication Communication Communication: Impaired Factors Affecting Communication: Hearing impaired   Cognition Arousal: Alert Behavior During  Therapy: WFL for tasks assessed/performed Cognition: Cognition impaired         Attention impairment (select first level of impairment): Sustained attention Executive functioning impairment (select all impairments): Reasoning, Problem solving, Sequencing OT - Cognition Comments: will  continue to assess                 Following commands: Intact Following commands impaired: Follows one step commands with increased time      Cueing   Cueing Techniques: Verbal cues, Tactile cues, Visual cues  Exercises      Shoulder Instructions       General Comments vss throughout    Pertinent Vitals/ Pain       Pain Assessment Pain Assessment: No/denies pain  Home Living                                          Prior Functioning/Environment              Frequency  Min 2X/week        Progress Toward Goals  OT Goals(current goals can now be found in the care plan section)  Progress towards OT goals: Progressing toward goals  Acute Rehab OT Goals Time For Goal Achievement: 03/20/24  Plan      Co-evaluation                 AM-PAC OT 6 Clicks Daily Activity     Outcome Measure   Help from another person eating meals?: A Little Help from another person taking care of personal grooming?: A Little Help from another person toileting, which includes using toliet, bedpan, or urinal?: A Lot Help from another person bathing (including washing, rinsing, drying)?: A Lot Help from another person to put on and taking off regular upper body clothing?: A Little Help from another person to put on and taking off regular lower body clothing?: A Lot 6 Click Score: 15    End of Session Equipment Utilized During Treatment: Gait belt;Rolling walker (2 wheels)  OT Visit Diagnosis: Other abnormalities of gait and mobility (R26.89);Muscle weakness (generalized) (M62.81)   Activity Tolerance Patient tolerated treatment well   Patient Left in chair;with call bell/phone within reach;with chair alarm set   Nurse Communication Mobility status        Time: 1529-1550 OT Time Calculation (min): 21 min  Charges: OT General Charges $OT Visit: 1 Visit OT Treatments $Therapeutic Activity: 8-22 mins  Therisa Sheffield, OTD OTR/L  03/10/2024, 4:19  PM

## 2024-03-10 NOTE — Plan of Care (Signed)

## 2024-03-10 NOTE — Progress Notes (Signed)
" °  Progress Note   Patient: Jack White FMW:[id number] DOB: 07/11/1940 DOA: 03/02/2024     6 DOS: the patient was seen and examined on 03/10/2024   Brief hospital course:     Principal Problem:   Precordial chest pain Active Problems:   Stage 3a chronic kidney disease (HCC)   Diabetes mellitus, type 2 (HCC)   Hypertension   Hyperkalemia   Parkinson disease (HCC)   Trigeminal neuralgia   BPH (benign prostatic hyperplasia)   History of traumatic subdural hematoma s/p craniotomy   Chest pain   Thrombocytopenia   Unstable angina (HCC)   Iron  deficiency anemia   Assessment and Plan: Unstable angina with coronary disease Minimal elevation troponin. Negative for PE, positive nuclear stress test.   Heart cath showed 95% stenosis in the LAD.  Status post drug-eluting stent, will continue dual antiplatelet treatment. Condition has improved.   Acute blood loss anemia. Iron  deficient anemia. Thrombocytopenia. Hemoglobin dropped down to 8.4, he has some bleeding from catheter site. Repeat iron  study has mild iron  patients today, start oral iron  supplement. B12 level pending. Hemoglobin dropped down to 7.0, give a transfusion of a PRBC.  20 mg Lasix  after transfusion. Patient does not have additional bleeding from catheter site, no evidence of GI bleed.  This could be from kidney function.  Continue to follow tomorrow, will ensure nursing observes stool   Stage 3a chronic kidney disease (HCC) Hyperkalemia Hyponatremia. Stable with mild hyperkalemia will re-dose lokelma   History of traumatic subdural hematoma s/p craniotomy No acute issues   BPH (benign prostatic hyperplasia) Continue tamsulosin    Parkinson disease (HCC) Trigeminal neuralgia Continue gabapentin  and carbamazepine  Resume home duloxetine    Hypertension Continue home meds   Diabetes mellitus, type 2 (HCC) Sliding scale insulin  coverage   Generalized weakness. Pt advising snf, bed search underway         Subjective:  Feeling fine, no dyspnea, no chest pain, tolerating diet  Physical Exam: Vitals:   03/09/24 2349 03/10/24 0401 03/10/24 0843 03/10/24 1158  BP: 128/66 115/71 (!) 119/55 (!) 120/54  Pulse: 86 75 76 81  Resp: 20 18 17 17   Temp: 98.1 F (36.7 C) 98.4 F (36.9 C) (!) 97.4 F (36.3 C) (!) 97.4 F (36.3 C)  TempSrc: Oral Oral  Oral  SpO2: 97% 97% 99% 98%  Weight:  95 kg     General exam: Appears calm and comfortable  Respiratory system: Clear to auscultation. Respiratory effort normal. Cardiovascular system: S1 & S2 heard, RRR. No JVD, murmurs, rubs, gallops or clicks. No pedal edema. Gastrointestinal system: Abdomen is nondistended, soft and nontender. No organomegaly or masses felt. Normal bowel sounds heard. Central nervous system: Alert and oriented. No focal neurological deficits. Extremities: warm Skin: No rashes, lesions or ulcers Psychiatry: Judgement and insight appear normal    Data Reviewed:  CT and lab results reviewed.  Family Communication: Daughter updated at bedside 1/14  Disposition: Status is: Inpatient Remains inpatient appropriate because: Disease severity, IV treatment.  Author: Devaughn KATHEE Ban, MD 03/10/2024 2:12 PM  For on call review [email or web address].    "

## 2024-03-10 NOTE — TOC Progression Note (Signed)
 Transition of Care [facility]) - Progression Note    Patient Details  Name: Jack White MRN: [id number] Date of Birth: 07/07/1940  Transition of Care [facility]) CM/SW Contact  Lauraine [name], LCSW Phone Number: 03/10/2024, 12:41 PM  Clinical Narrative: CSW called daughter. Discussed SNF vs home health. Sent out referrals for both. If plan will be for SNF, top preference are [facility] (currently full), [facility], and [facility].    Expected Discharge Plan: Home/Self Care Barriers to Discharge: Continued Medical Work up               Expected Discharge Plan and Services     Post Acute Care Choice: NA Living arrangements for the past 2 months: Single Family Home                                       Social Drivers of Health (SDOH) Interventions SDOH Screenings   Food Insecurity: No Food Insecurity (03/03/2024)  Housing: Low Risk (03/03/2024)  Transportation Needs: No Transportation Needs (03/03/2024)  Utilities: Not At Risk (03/03/2024)  Financial Resource Strain: High Risk (10/07/2023)   Received from [facility]  Social Connections: Moderately Integrated (03/03/2024)  Tobacco Use: Low Risk (12/09/2023)    Readmission Risk Interventions    03/05/2024   12:58 PM  Readmission Risk Prevention Plan  Transportation Screening Complete  PCP or Specialist Appt within 3-5 Days Complete  Social Work Consult for Recovery Care Planning/Counseling Complete  Palliative Care Screening Not Applicable  Medication Review Oceanographer) Complete

## 2024-03-10 NOTE — Progress Notes (Signed)
 " [facility] CLINIC CARDIOLOGY PROGRESS NOTE       Patient ID: Jack White MRN: [id number] DOB/AGE: [birth year] 84 y.o.  Admit date: 03/02/2024 Referring Physician Dr. Delayne Solian Primary Physician Diedra Lame, MD  Primary Cardiologist none Reason for Consultation chest pain  HPI: Jack White is a 84 y.o. male  with a past medical history of hypertension, type 2 diabetes, CKD stage IIIa, BPH, history of traumatic subdural hematoma s/p craniotomy who presented to the ED on 03/02/2024 for shortness of breath and chest pain. Cardiology was consulted for further evaluation.   Interval history: -Patient seen and examined this AM, resting in hospital bed. About to work with PT. - Awake and alert. - Left arm bruises stable. - BP stable.  No events noted on telemetry. - Hgb down to 7 and received transfusion yesterday.  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 Lucien   COLOSTOMY TAKEDOWN N/A 10/26/2014   Procedure: COLOSTOMY TAKEDOWN;  Surgeon: Elgin Laurence III, MD;  Location: [facility] ORS;  Service: General;  Laterality: N/A;   CORONARY STENT INTERVENTION N/A 03/04/2024   Procedure: CORONARY STENT INTERVENTION;  Surgeon: Florencio Cara BIRCH, MD;  Location: [facility] INVASIVE CV LAB;  Service: Cardiovascular;  Laterality: N/A;   CRANIOTOMY Right 07/21/2023   Procedure: CRANIOTOMY HEMATOMA  EVACUATION SUBDURAL;  Surgeon: Lanis Pupa, MD;  Location: [facility] OR;  Service: Neurosurgery;  Laterality: Right;   EXPLORATORY LAPAROTOMY  05/28/2014   Dr. Lucien   LAPAROSCOPIC CHOLECYSTECTOMY  05/21/2014   Dr. Laurence   LEFT HEART CATH AND CORONARY ANGIOGRAPHY N/A 03/04/2024   Procedure: LEFT HEART CATH AND CORONARY ANGIOGRAPHY;  Surgeon: Florencio Cara BIRCH, MD;  Location: [facility] INVASIVE CV LAB;  Service: Cardiovascular;  Laterality: N/A;    Medications Prior to Admission  Medication Sig Dispense Refill Last Dose/Taking   acetaminophen  (TYLENOL ) 500 MG tablet Take 500-1,000 mg by mouth 2 (two) times daily.   Unknown   atorvastatin  (LIPITOR) 10 MG tablet Take 1 tablet (10 mg total) by mouth at bedtime.   03/01/2024 Bedtime   butalbital -aspirin -caffeine  (FIORINAL) 50-325-40 MG capsule TAKE 1 CAPSULE BY MOUTH EVERY 6 HOURS AS NEEDED FOR HEADACHE   Unknown   carbamazepine  (TEGRETOL ) 200 MG tablet Take 1 tablet (200 mg total) by mouth 3 (three) times daily.   03/02/2024 Morning   diphenhydrAMINE  (BENADRYL ) 25 MG tablet Take 2 tablets (50 mg total) by mouth at bedtime.   03/01/2024 Bedtime   doxazosin  (CARDURA ) 4 MG tablet Take 4 mg by mouth daily.   03/02/2024 Morning   DULoxetine  (CYMBALTA ) 60 MG capsule Take 60 mg by mouth daily.   03/02/2024 Morning   ergocalciferol  (VITAMIN D2) 1.25 MG (50000 UT) capsule Take 50,000 Units by mouth once a week.   Past Week   ferrous sulfate  325 (65 FE) MG tablet Take  1 tablet (325 mg total) by mouth daily with breakfast.   03/02/2024 Morning   fluticasone  (FLONASE ) 50 MCG/ACT nasal spray Place 2 sprays into both nostrils daily as needed for allergies.   Unknown   folic acid  (FOLVITE ) 1 MG tablet Take 1 tablet (1 mg total) by mouth daily.   03/02/2024 Morning   gabapentin  (NEURONTIN ) 300 MG capsule Take 300 mg by mouth at bedtime.   03/01/2024 Bedtime   guaiFENesin -dextromethorphan (ROBITUSSIN DM) 100-10 MG/5ML syrup Take 5 mLs by mouth every 4 (four) hours as needed for cough. 118  mL 0 Unknown   hydrocortisone  cream 1 % Apply 1 Application topically 3 (three) times daily as needed for itching.   Unknown   hydroxypropyl methylcellulose / hypromellose (ISOPTO TEARS / GONIOVISC) 2.5 % ophthalmic solution Place 2 drops into both eyes 4 (four) times daily as needed for dry eyes.   Unknown   ipratropium-albuterol  (DUONEB) 0.5-2.5 (3) MG/3ML SOLN Take 3 mLs by nebulization every 6 (six) hours as needed (wheezing).   Unknown   melatonin 10 MG TABS Take 10 mg by mouth at bedtime.   03/01/2024 Bedtime   metFORMIN  (GLUCOPHAGE -XR) 500 MG 24 hr tablet Take 500 mg by mouth daily.   03/02/2024 Morning   modafinil  (PROVIGIL ) 100 MG tablet Take 1 tablet (100 mg total) by mouth daily. 10 tablet 0 03/02/2024 Morning   montelukast  (SINGULAIR ) 10 MG tablet Take 10 mg by mouth at bedtime.   03/01/2024 Bedtime   omeprazole (PRILOSEC) 20 MG capsule Take 20 mg by mouth daily.   03/02/2024 Morning   polyethylene glycol (MIRALAX  / GLYCOLAX ) 17 g packet Take 17 g by mouth daily as needed.   Unknown   propranolol  (INDERAL ) 20 MG tablet Take 20 mg by mouth 2 (two) times daily.   03/02/2024 Morning   sennosides (SENOKOT) 8.8 MG/5ML syrup Take 5 mLs by mouth at bedtime.   03/01/2024 Bedtime   [EXPIRED] tamsulosin  (FLOMAX ) 0.4 MG CAPS capsule Take 0.4 mg by mouth daily after breakfast.   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   Overall Financial Resource Strain (CARDIA)    How hard is it for you to pay for the very basics like food, housing, medical care, and heating?: Very hard  Food Insecurity: No Food Insecurity (03/03/2024)   Epic  Worried About Programme Researcher, Broadcasting/film/video in the Last Year: Never true    Ran Out of Food in the Last Year: Never true  Transportation Needs: No Transportation Needs (03/03/2024)   Epic    Lack of Transportation (Medical): No    Lack of Transportation (Non-Medical): No  Physical Activity: Not on file  Stress: Not on file  Social Connections: Moderately Integrated (03/03/2024)   Social Connection and Isolation Panel    Frequency of Communication with Friends and Family: Three times a week    Frequency of Social Gatherings with Friends and Family: Three times a week    Attends Religious Services: More than 4 times per year    Active Member of Clubs or Organizations: Yes    Attends Banker Meetings: 1 to 4 times per year    Marital Status: Widowed  Intimate Partner Violence: Not At Risk (03/03/2024)   Epic    Fear of Current or Ex-Partner: No    Emotionally Abused: No    Physically Abused: No    Sexually Abused: No  Depression (PHQ2-9): Not on file  Alcohol  Screen: Not on file  Housing: Low Risk (03/03/2024)   Epic    Unable to Pay for Housing in the Last Year: No    Number of Times Moved in the Last Year: 0    Homeless in the Last Year: No  Utilities: Not At Risk (03/03/2024)   Epic    Threatened with loss of utilities: No  Health Literacy: Not on file    Family History  Problem Relation  Age of Onset   Diabetes Mother    Hypertension Mother    Alzheimer's disease Mother    Heart disease Father    Diabetes Sister      Vitals:   03/09/24 2014 03/09/24 2349 03/10/24 0401 03/10/24 0843  BP: (!) 145/59 128/66 115/71 (!) 119/55  Pulse: 90 86 75 76  Resp: 18 20 18 17   Temp: 98.2 F (36.8 C) 98.1 F (36.7 C) 98.4 F (36.9 C) (!) 97.4 F (36.3 C)  TempSrc: Oral Oral Oral   SpO2: 96% 97% 97% 99%  Weight:   95 kg     PHYSICAL EXAM General: Chronically ill-appearing elderly male, well nourished, in no acute distress. HEENT: Normocephalic and atraumatic. Neck: No JVD.  Lungs: Normal respiratory effort on room air. Clear bilaterally to auscultation. No wheezes, crackles, rhonchi.  Heart: HRRR. Normal S1 and S2 without gallops or murmurs.  Abdomen: Non-distended appearing.  Msk: Normal strength and tone for age. Extremities: Warm and well perfused. No clubbing, cyanosis.  No edema.  Neuro: Alert and oriented X 3. Psych: Answers questions appropriately.   Labs: Basic Metabolic Panel: Recent Labs    03/09/24 0502 03/09/24 1401 03/10/24 0411  NA 136  --  136  K 5.5* 5.2* 5.3*  CL 105  --  103  CO2 26  --  25  GLUCOSE 174*  --  173*  BUN 51*  --  42*  CREATININE 1.66*  --  1.45*  CALCIUM  7.7*  --  8.3*  MG 2.8*  --   --    Liver Function Tests: No results for input(s): AST, ALT, ALKPHOS, BILITOT, PROT, ALBUMIN  in the last 72 hours. No results for input(s): LIPASE, AMYLASE in the last 72 hours.  CBC: Recent Labs    03/09/24 0502 03/10/24 0411  WBC 5.7 5.1  HGB 7.0* 8.3*  HCT 22.0* 24.5*  MCV 100.5* 95.3  PLT 162 170  Cardiac Enzymes: No results for input(s): CKTOTAL, CKMB, CKMBINDEX, TROPONINIHS in the last 72 hours. BNP: No results for input(s): BNP in the last 72 hours. D-Dimer: No results for input(s): DDIMER in the last 72 hours.  Hemoglobin A1C: No results for input(s): HGBA1C in the last 72 hours. Fasting  Lipid Panel: No results for input(s): CHOL, HDL, LDLCALC, TRIG, CHOLHDL, LDLDIRECT in the last 72 hours. Thyroid  Function Tests: No results for input(s): TSH, T4TOTAL, T3FREE, THYROIDAB in the last 72 hours.  Invalid input(s): FREET3 Anemia Panel: Recent Labs    03/09/24 0502 03/09/24 0738  VITAMINB12  --  403  FERRITIN 48  --   TIBC 298  --   IRON  42*  --      Radiology: CARDIAC CATHETERIZATION Result Date: 03/08/2024   Mid LAD lesion is 95% stenosed.   A drug-eluting stent was successfully placed using a STENT ONYX FRONTIER 2.75X15.   Post intervention, there is a 0% residual stenosis.   The left ventricular systolic function is normal.   LV end diastolic pressure is normal.   The left ventricular ejection fraction is 55-65% by visual estimate.   There is no mitral valve regurgitation.   In the absence of any other complications or medical issues, we expect the patient to be ready for discharge from an interventional cardiology perspective.   Recommend uninterrupted dual antiplatelet therapy with Aspirin  81mg  daily and Clopidogrel  75mg  daily for a minimum of 12 months (ACS-Class I recommendation). Conclusion Inpatient left heart cath right radial approach because of positive imaging angina 6 French sheath placed in right radial artery Left ventriculogram showed normal left ventricular function EF of 55% Coronaries Left main large free of disease LAD large 25% proximal 95% mid focal Circumflex very large left dominant free of disease RCA small nondominant Intervention Successful PCI and stent of mid LAD with deployment of a 2.5 x 15 mm frontier Onyx postdilated with a 275 x 12 mm Orrum neo up to 16 atm Lesion reduced from 95 down to 0 TIMI-3 flow maintained throughout Patient tolerated seizure well No complication Patient maintained on aspirin  and Plavix  for at least 6 months Anticipate discharge within 48 to 72 hours   CT HEAD WO CONTRAST ( ) Result Date: 03/08/2024 EXAM:  CT HEAD WITHOUT CONTRAST 03/08/2024 01:58:26 PM TECHNIQUE: CT of the head was performed without the administration of intravenous contrast. Automated exposure control, iterative reconstruction, and/or weight based adjustment of the mA/kV was utilized to reduce the radiation dose to as low as reasonably achievable. COMPARISON: None available. CLINICAL HISTORY: Neuro deficit, acute, stroke suspected Neuro deficit, acute, stroke suspected FINDINGS: BRAIN AND VENTRICLES: No acute hemorrhage. No evidence of acute infarct. 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 No hydrocephalus. No extra-axial collection. No mass effect or midline shift. Previous right frontal craniotomy with stable underlying postsurgical changes. ORBITS: No acute abnormality. SINUSES: No acute abnormality. SOFT TISSUES AND SKULL: No acute soft tissue abnormality. No skull fracture. IMPRESSION: 1. No acute intracranial abnormality. 2. Previous right frontal craniotomy with stable underlying postsurgical changes. Electronically signed by: Gilmore Molt MD MD 03/08/2024 02:38 PM EST RP Workstation: [id number]   ECHOCARDIOGRAM COMPLETE Result Date: 03/05/2024    ECHOCARDIOGRAM REPORT   Patient Name:   Jack GAUTHREAUX Date of Exam: 03/04/2024 Medical Rec #:  [id number]       Height:       75.0 in Accession #:    [id number]      Weight:  205.2 lb Date of Birth:  [birth year]      BSA:          2.219 m Patient Age:    83 years        BP:           95/53 mmHg Patient Gender: M               HR:           56 bpm. Exam Location:  [facility] Procedure: 2D Echo, Cardiac Doppler, Color Doppler and Strain Analysis (Both            Spectral and Color Flow Doppler were utilized during procedure). Indications:     Chest pain R07.9  History:         Patient has no prior history of Echocardiogram examinations.                  Previous Myocardial Infarction; Risk Factors:Hypertension.  Sonographer:     Christopher Furnace Referring Phys:  [id number]  DELAYNE LULLA SOLIAN Diagnosing Phys: Cara JONETTA Lovelace MD  Sonographer Comments: Suboptimal parasternal window. Global longitudinal strain was attempted. IMPRESSIONS  1. Left ventricular ejection fraction, by estimation, is 55 to 60%. The left ventricle has normal function. The left ventricle has no regional wall motion abnormalities. There is moderate concentric left ventricular hypertrophy. Left ventricular diastolic parameters are consistent with Grade I diastolic dysfunction (impaired relaxation). The average left ventricular global longitudinal strain is 16.3 %. The global longitudinal strain is normal.  2. Right ventricular systolic function is normal. The right ventricular size is normal.  3. The mitral valve is normal in structure. Trivial mitral valve regurgitation.  4. The aortic valve is normal in structure. Aortic valve regurgitation is not visualized. Aortic valve sclerosis/calcification is present, without any evidence of aortic stenosis. FINDINGS  Left Ventricle: Left ventricular ejection fraction, by estimation, is 55 to 60%. The left ventricle has normal function. The left ventricle has no regional wall motion abnormalities. The average left ventricular global longitudinal strain is 16.3 %. Strain was performed and the global longitudinal strain is normal. The left ventricular internal cavity size was normal in size. There is moderate concentric left ventricular hypertrophy. Left ventricular diastolic parameters are consistent with Grade I diastolic dysfunction (impaired relaxation). Right Ventricle: The right ventricular size is normal. No increase in right ventricular wall thickness. Right ventricular systolic function is normal. Left Atrium: Left atrial size was normal in size. Right Atrium: Right atrial size was normal in size. Pericardium: There is no evidence of pericardial effusion. Mitral Valve: The mitral valve is normal in structure. Trivial mitral valve regurgitation. MV peak gradient, 4.2  mmHg. The mean mitral valve gradient is 1.0 mmHg. Tricuspid Valve: The tricuspid valve is normal in structure. Tricuspid valve regurgitation is trivial. Aortic Valve: The aortic valve is normal in structure. Aortic valve regurgitation is not visualized. Aortic valve sclerosis/calcification is present, without any evidence of aortic stenosis. Aortic valve mean gradient measures 4.0 mmHg. Aortic valve peak  gradient measures 6.9 mmHg. Aortic valve area, by VTI measures 1.86 cm. Pulmonic Valve: The pulmonic valve was normal in structure. Pulmonic valve regurgitation is not visualized. Aorta: The aortic root was not well visualized. IAS/Shunts: No atrial level shunt detected by color flow Doppler. Additional Comments: 3D was performed not requiring image post processing on an independent workstation and was indeterminate.  LEFT VENTRICLE PLAX 2D LVIDd:         4.30 cm   Diastology LVIDs:  3.00 cm   LV e' medial:    4.79 cm/s LV PW:         1.40 cm   LV E/e' medial:  13.0 LV IVS:        1.50 cm   LV e' lateral:   8.59 cm/s LVOT diam:     2.20 cm   LV E/e' lateral: 7.3 LV SV:         53 LV SV Index:   24        2D Longitudinal Strain LVOT Area:     3.80 cm  2D Strain GLS (A4C):   18.0 % LV IVRT:       108 msec  2D Strain GLS (A3C):   16.2 %                          2D Strain GLS (A2C):   14.8 %                          2D Strain GLS Avg:     16.3 % RIGHT VENTRICLE RV Basal diam:  4.10 cm RV Mid diam:    3.30 cm RV S prime:     13.10 cm/s TAPSE (M-mode): 2.0 cm LEFT ATRIUM             Index        RIGHT ATRIUM           Index LA diam:        3.10 cm 1.40 cm/m   RA Area:     21.60 cm LA Vol (A2C):   23.1 ml 10.41 ml/m  RA Volume:   63.50 ml  28.62 ml/m LA Vol (A4C):   25.3 ml 11.40 ml/m LA Biplane Vol: 25.2 ml 11.36 ml/m  AORTIC VALVE AV Area (Vmax):    1.91 cm AV Area (Vmean):   1.83 cm AV Area (VTI):     1.86 cm AV Vmax:           131.67 cm/s AV Vmean:          93.933 cm/s AV VTI:            0.284 m AV  Peak Grad:      6.9 mmHg AV Mean Grad:      4.0 mmHg LVOT Vmax:         66.30 cm/s LVOT Vmean:        45.300 cm/s LVOT VTI:          0.139 m LVOT/AV VTI ratio: 0.49  AORTA Ao Root diam: 3.00 cm MITRAL VALVE               TRICUSPID VALVE MV Area (PHT): 1.85 cm    TR Peak grad:   12.0 mmHg MV Area VTI:   1.87 cm    TR Vmax:        173.00 cm/s MV Peak grad:  4.2 mmHg MV Mean grad:  1.0 mmHg    SHUNTS MV Vmax:       1.02 m/s    Systemic VTI:  0.14 m MV Vmean:      55.6 cm/s   Systemic Diam: 2.20 cm MV Decel Time: 410 msec MV E velocity: 62.40 cm/s MV A velocity: 91.20 cm/s MV E/A ratio:  0.68 Dwayne D Callwood MD Electronically signed by Cara JONETTA Lovelace MD Signature Date/Time: 03/05/2024/2:51:30 PM    Final  NM Myocar Multi W/Spect W/Wall Motion / EF Result Date: 03/03/2024   Findings are consistent with ischemia. The study is high risk.   No ST deviation was noted.   LV perfusion is abnormal. There is evidence of ischemia. There is no evidence of infarction. Defect 1: There is a medium defect with moderate reduction in uptake present in the mid to basal inferolateral and lateral location(s) that is reversible. There is normal wall motion in the defect area. Consistent with ischemia.   Left ventricular function is normal. End diastolic cavity size is normal.   CT images were obtained for attenuation correction and were examined for the presence of coronary calcium  when appropriate. Conclusion Abnormal myocardial perfusion scan large area of lateral lateral basal evidence of ischemia moderate intensity Ejection fraction normal around 58% with normal wall motion Consider further evaluation possibly invasively to rule out significant obstructive disease   CT Angio Chest PE W/Cm &/Or Wo Cm Result Date: 03/03/2024 EXAM: CTA of the Chest with contrast for PE 03/03/2024 12:26:56 AM TECHNIQUE: CTA of the chest was performed without and with the administration of 75 mL of iohexol  (OMNIPAQUE ) 350 MG/ML injection.  Multiplanar reformatted images are provided for review. MIP images are provided for review. Automated exposure control, iterative reconstruction, and/or weight based adjustment of the mA/kV was utilized to reduce the radiation dose to as low as reasonably achievable. COMPARISON: 07/27/2023 CLINICAL HISTORY: Pulmonary embolism (PE) suspected, low to intermediate prob, positive D-dimer. FINDINGS: PULMONARY ARTERIES: Pulmonary arteries are adequately opacified for evaluation. No pulmonary embolism. Main pulmonary artery is normal in caliber. MEDIASTINUM: Coronary artery and aortic atherosclerosis. LYMPH NODES: No mediastinal, hilar or axillary lymphadenopathy. LUNGS AND PLEURA: Scarring in the lingula and right middle lobe. No acute confluent airspace opacities. No pulmonary edema. No pleural effusion or pneumothorax. UPPER ABDOMEN: Limited images of the upper abdomen are unremarkable. SOFT TISSUES AND BONES: No acute bone or soft tissue abnormality. IMPRESSION: 1. No pulmonary embolism. 2. No acute pulmonary abnormality. 3. Coronary artery disease. Aortic atherosclerosis. Electronically signed by: Franky Crease MD 03/03/2024 12:33 AM EST RP Workstation: [id number]   CT Head Wo Contrast Result Date: 03/02/2024 CLINICAL DATA:  Fall 2 weeks ago.  Episode of emesis today. EXAM: CT HEAD WITHOUT CONTRAST TECHNIQUE: Contiguous axial images were obtained from the base of the skull through the vertex without intravenous contrast. RADIATION DOSE REDUCTION: This exam was performed according to the departmental dose-optimization program which includes automated exposure control, adjustment of the mA and/or kV according to patient size and/or use of iterative reconstruction technique. COMPARISON:  09/19/2023, 07/21/2023 FINDINGS: Brain: Ventricles, cisterns and other CSF spaces are unchanged and within normal. There is minimal chronic ischemic microvascular disease present. No mass, mass effect, shift of midline structures or  acute hemorrhage. Postsurgical/posttraumatic sequelae with thickening/calcification of the right frontal subdural region unchanged from 09/19/2023. This directly underlies the right frontal craniotomy defect. Vascular: No hyperdense vessel or unexpected calcification. Skull: Evidence of previous right frontal craniectomy. Sinuses/Orbits: Hypoplastic frontal sinuses as the paranasal sinuses are otherwise clear. Mastoid air cells are clear. Orbits are normal. Other: None. IMPRESSION: 1. No acute findings. 2. Minimal chronic ischemic microvascular disease. 3. Previous right frontal craniotomy with stable underlying postsurgical changes. Electronically Signed   By: Toribio Agreste M.D.   On: 03/02/2024 16:31   DG Chest 2 View Result Date: 03/02/2024 EXAM: 2 VIEW(S) XRAY OF THE CHEST 03/02/2024 03:51:00 PM COMPARISON: 08/31/2023 CLINICAL HISTORY: CP FINDINGS: LUNGS AND PLEURA: Low lung volumes. No focal pulmonary  opacity. No pleural effusion. No pneumothorax. HEART AND MEDIASTINUM: Cardiomegaly. Tortuous thoracic aorta. Atherosclerotic calcifications. BONES AND SOFT TISSUES: Multilevel degenerative changes of thoracic spine. No acute osseous abnormality. IMPRESSION: 1. No acute findings. 2. Cardiomegaly. Electronically signed by: Greig Pique MD 03/02/2024 04:29 PM EST RP Workstation: [id number]    ECHO as above  TELEMETRY (personally reviewed): Sinus rhythm rate 80s  EKG (personally reviewed): Normal sinus rhythm rate 63 bpm  Data reviewed by me 03/10/2024: last 24h vitals tele labs imaging I/O ED provider note, admission H&P, hospitalist progress note  Principal Problem:   Precordial chest pain Active Problems:   Stage 3a chronic kidney disease (HCC)   Diabetes mellitus, type 2 (HCC)   Hypertension   Hyperkalemia   Parkinson disease (HCC)   Trigeminal neuralgia   BPH (benign prostatic hyperplasia)   History of traumatic subdural hematoma s/p craniotomy   Chest pain   Thrombocytopenia   Unstable  angina (HCC)   Iron  deficiency anemia    ASSESSMENT AND PLAN:  Jack White is a 84 y.o. male  with a past medical history of hypertension, type 2 diabetes, CKD stage IIIa, BPH, history of traumatic subdural hematoma s/p craniotomy who presented to the ED on 03/02/2024 for shortness of breath and chest pain. Cardiology was consulted for further evaluation.   # Coronary artery disease s/p DES # Hypertension # Chronic kidney disease stage IIIa Patient presented with complaints of shortness of breath and chest discomfort after an episode of vomiting yesterday.  Troponins are minimal at 23 and 24.  Chest x-ray without acute abnormality.  EKG without acute ischemic changes.  Overall feeling improved today. Nuclear stress 03/03/24 with large area of lateral basal evidence of ischemia moderate intensity.  LHC 03/04/2024 revealed mid LAD lesion s/p DES.  Echo this admission with EF 55-60%, no wall motion abnormalities, moderate concentric LVH, grade 1 diastolic dysfunction.  - Continue atorvastatin  40 mg daily.  Continue aspirin  81 mg daily, Plavix  75 mg daily for DAPT. - Home propranolol  was discontinued by primary team. - Currently pending further PT eval, possible SNF placement.   This patient's plan of care was discussed and created with Dr. Florencio and he is in agreement.  Signed: Danita Bloch, PA-C  03/10/2024, 8:54 AM [facility]      "

## 2024-03-11 DIAGNOSIS — R072 Precordial pain: Secondary | ICD-10-CM | POA: Diagnosis not present

## 2024-03-11 LAB — CBC
HCT: 25.2 % — ABNORMAL LOW (ref 39.0–52.0)
Hemoglobin: 8.2 g/dL — ABNORMAL LOW (ref 13.0–17.0)
MCH: 32.2 pg (ref 26.0–34.0)
MCHC: 32.5 g/dL (ref 30.0–36.0)
MCV: 98.8 fL (ref 80.0–100.0)
Platelets: 176 K/uL (ref 150–400)
RBC: 2.55 MIL/uL — ABNORMAL LOW (ref 4.22–5.81)
RDW: 13.7 % (ref 11.5–15.5)
WBC: 5.6 K/uL (ref 4.0–10.5)
nRBC: 0 % (ref 0.0–0.2)

## 2024-03-11 LAB — BASIC METABOLIC PANEL WITH GFR
Anion gap: 5 (ref 5–15)
BUN: 36 mg/dL — ABNORMAL HIGH (ref 8–23)
CO2: 29 mmol/L (ref 22–32)
Calcium: 8.3 mg/dL — ABNORMAL LOW (ref 8.9–10.3)
Chloride: 103 mmol/L (ref 98–111)
Creatinine, Ser: 1.44 mg/dL — ABNORMAL HIGH (ref 0.61–1.24)
GFR, Estimated: 48 mL/min — ABNORMAL LOW
Glucose, Bld: 145 mg/dL — ABNORMAL HIGH (ref 70–99)
Potassium: 5.4 mmol/L — ABNORMAL HIGH (ref 3.5–5.1)
Sodium: 137 mmol/L (ref 135–145)

## 2024-03-11 LAB — GLUCOSE, CAPILLARY: Glucose-Capillary: 232 mg/dL — ABNORMAL HIGH (ref 70–99)

## 2024-03-11 MED ORDER — SODIUM ZIRCONIUM CYCLOSILICATE 10 G PO PACK
10.0000 g | PACK | Freq: Once | ORAL | Status: AC
  Administered 2024-03-11: 10 g via ORAL
  Filled 2024-03-11: qty 1

## 2024-03-11 NOTE — Progress Notes (Signed)
" °  Progress Note   Patient: Jack White FMW:[id number] DOB: Feb 01, 1941 DOA: 03/02/2024     7 DOS: the patient was seen and examined on 03/11/2024   Brief hospital course:  MARCKUS HANOVER is a 84 y.o. 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  Patient has elevated D-dimer, CT angiogram was negative for PE. Patient also had a stress nuclear stress test, was positive for ischemia.  Patient had a heart cath performed on 1/8, placed LAD drug-eluting stent.   Principal Problem:   Precordial chest pain Active Problems:   Stage 3a chronic kidney disease (HCC)   Diabetes mellitus, type 2 (HCC)   Hypertension   Hyperkalemia   Parkinson disease (HCC)   Trigeminal neuralgia   BPH (benign prostatic hyperplasia)   History of traumatic subdural hematoma s/p craniotomy   Chest pain   Thrombocytopenia   Unstable angina (HCC)   Iron  deficiency anemia   Assessment and Plan: Unstable angina with coronary disease Minimal elevation troponin. Negative for PE, positive nuclear stress test.   Heart cath showed 95% stenosis in the LAD.  Status post drug-eluting stent, will continue dual antiplatelet treatment. Denies chest pain   Acute blood loss anemia. Iron  deficient anemia. Thrombocytopenia. Hgb now stable in the 8s after 1 unit, no bleeding, will monitor   Stage 3a chronic kidney disease (HCC) Hyperkalemia Hyponatremia. Stable with mild hyperkalemia will continue lokelma   History of traumatic subdural hematoma s/p craniotomy No acute issues   BPH (benign prostatic hyperplasia) Continue tamsulosin    Parkinson disease (HCC) Trigeminal neuralgia Continue gabapentin  and carbamazepine  Resume home duloxetine    Hypertension Continue home meds   Diabetes mellitus, type 2 (HCC) Sliding scale insulin   coverage   Generalized weakness. Pt advising snf, bed search underway        Subjective:  Sleepy today, sitting in chair, denies pain  Physical Exam: Vitals:   03/10/24 2007 03/11/24 0024 03/11/24 0423 03/11/24 0925  BP: 130/81 124/67 (!) 122/59 106/60  Pulse: 88 76 75 94  Resp: 20 20 20    Temp: 98 F (36.7 C) 97.9 F (36.6 C) 97.9 F (36.6 C) 97.7 F (36.5 C)  TempSrc:    Oral  SpO2: 94% 93% 96% 97%  Weight:       General exam: Appears calm and comfortable  Respiratory system: Clear to auscultation. Respiratory effort normal. Cardiovascular system: S1 & S2 heard, RRR.   Gastrointestinal system: Abdomen is nondistended, soft and nontender.   Central nervous system: somnolent but arousable Extremities: warm Skin: No rashes, lesions or ulcers Psychiatry: calm   Data Reviewed:  CT and lab results reviewed.  Family Communication: Daughter updated telephonically 1/15  Disposition: Status is: Inpatient Remains inpatient appropriate because: pending snf placement  Author: Devaughn KATHEE Ban, MD 03/11/2024 12:51 PM  For on call review [email or web address].    "

## 2024-03-11 NOTE — Progress Notes (Signed)
 Physical Therapy Treatment Patient Details Name: Jack White MRN: [id number] DOB: 1940-06-15 Today's Date: 03/11/2024   History of Present Illness Pt is an 84 y/o M admitted on 03/02/24 after presenting with c/o chest pain & vomiting. Pt with positive nuclear stress test, s/p heart cath with PCI & stent 03/04/24. PMH: HTN, DM, CKD 3A, possible Parkinson's & trigeminal neuralgia, BPH, craniotomy 2/2 traumatic SDH    PT Comments  PT/OT co-treat 2/2 to pt/staff safety and need for +2 assistance for all standing activity. He was in recliner pre/post session and remains cooperative throughout. Much less severe tremors/jerking movements versus previous date. He tolerated stand several times and then was even able to advance to take severe (52ft) steps with extremely close chair follor and +2 assistance. Still has occasional buckling in which he returns to seated position with +2 assist for safety. Overall remains far from his baseline and will benefit from continued skilled PT to maximize independence and safety with all ADLs.    If plan is discharge home, recommend the following: Two people to help with walking and/or transfers;A lot of help with bathing/dressing/bathroom;Assistance with cooking/housework;Assistance with feeding;Direct supervision/assist for medications management;Direct supervision/assist for financial management;Assist for transportation;Help with stairs or ramp for entrance     Equipment Recommendations  Rolling walker (2 wheels);BSC/3in1;Wheelchair (measurements PT);Wheelchair cushion (measurements PT);Hospital bed (sara steady)       Precautions / Restrictions Precautions Precautions: Fall (Simultaneous filing. User may not have seen previous data.) Restrictions Weight Bearing Restrictions Per Provider Order: No (Simultaneous filing. User may not have seen previous data.) Other Position/Activity Restrictions: Recent heart cath RUE     Mobility  Bed Mobility Overal bed  mobility: Needs Assistance Bed Mobility: Supine to Sit  Supine to sit: Min assist, HOB elevated, Used rails   Transfers Overall transfer level: Needs assistance Equipment used: Rolling walker (2 wheels) Transfers: Sit to/from Stand Sit to Stand: Min assist, +2 safety/equipment, From elevated surface  General transfer comment: Pt was able to stand 3 x total to RW with improved safety. Much less myoclonic jerking/severe tremors however still has some noted during standing activity.    Ambulation/Gait Ambulation/Gait assistance: +2 physical assistance, +2 safety/equipment, Min assist Gait Distance (Feet): 6 Feet Assistive device: Rolling walker (2 wheels) Gait Pattern/deviations: Step-to pattern Gait velocity: decreased  General Gait Details: Very close chair follwo for additional gait safety due to pt having some buckling but overall improved since last session    Balance Overall balance assessment: Needs assistance Sitting-balance support: Feet supported, Bilateral upper extremity supported Sitting balance-Leahy Scale: Fair     Standing balance support: Bilateral upper extremity supported, During functional activity, Reliant on assistive device for balance Standing balance-Leahy Scale: Poor Standing balance comment: Pt remains high fall risk even with BUE support       Communication Communication Communication: No apparent difficulties  Cognition Arousal: Alert Behavior During Therapy: WFL for tasks assessed/performed   PT - Cognitive impairments: Safety/Judgement, Awareness    Following commands: Intact Following commands impaired: Follows one step commands with increased time    Cueing Cueing Techniques: Verbal cues, Tactile cues, Visual cues         Pertinent Vitals/Pain Pain Assessment Pain Assessment: No/denies pain     PT Goals (current goals can now be found in the care plan section) Acute Rehab PT Goals Patient Stated Goal: rehab then home Progress towards  PT goals: Progressing toward goals    Frequency    Min 2X/week  Co-evaluation   Reason for Co-Treatment: To address functional/ADL transfers          AM-PAC PT 6 Clicks Mobility   Outcome Measure  Help needed turning from your back to your side while in a flat bed without using bedrails?: A Lot Help needed moving from lying on your back to sitting on the side of a flat bed without using bedrails?: A Lot Help needed moving to and from a bed to a chair (including a wheelchair)?: A Lot Help needed standing up from a chair using your arms (e.g., wheelchair or bedside chair)?: A Lot Help needed to walk in hospital room?: Total Help needed climbing 3-5 steps with a railing? : Total 6 Click Score: 10    End of Session Equipment Utilized During Treatment: Gait belt Activity Tolerance: Patient tolerated treatment well Patient left: in chair;with call bell/phone within reach;with chair alarm set;with nursing/sitter in room Nurse Communication: Mobility status PT Visit Diagnosis: Unsteadiness on feet (R26.81);Muscle weakness (generalized) (M62.81);Difficulty in walking, not elsewhere classified (R26.2);Other abnormalities of gait and mobility (R26.89)     Time: 1137-1203 PT Time Calculation (min) (ACUTE ONLY): 26 min  Charges:    $Gait Training: 8-22 mins PT General Charges $$ ACUTE PT VISIT: 1 Visit                    Rankin Essex PTA 03/11/24, 1:30 PM

## 2024-03-11 NOTE — Consult Note (Signed)
 WOC Nurse Consult Note: Reason for Consult:Stage 2 pressure injury to sacrum, extends down into gluteal fold.  Patient with limited mobility, weakness.  Has been wearing external urinary catheter, but some exposure to moisture due to leaking.  Wound type: pressure and moisture Pressure Injury POA: NO Measurement: 2.5 cm x 1 cm x 0.1 cm  Wound bed: pink and moist  Drainage (amount, consistency, odor) scant weeping Periwound: intact  Dressing procedure/placement/frequency: Cleanse sacral wound with soap and water  and pat dry. Apply alginate  (ITEM # [id number])  to wound bed as this area is staying too moist with foam dressing only  May cover with foam dressing.  Change daily.    Will not follow at this time.  Please re-consult if needed.  Darice Cooley MSN, RN, FNP-BC CWON Wound, Ostomy, Continence Nurse [facility] [phone] Work cell phone:  [phone]

## 2024-03-11 NOTE — Progress Notes (Signed)
 Occupational Therapy Treatment Patient Details Name: Jack White MRN: [id number] DOB: [birth year] Today's Date: 03/11/2024   History of present illness Pt is an 84 y/o M admitted on 03/02/24 after presenting with c/o chest pain & vomiting. Pt with positive nuclear stress test, s/p heart cath with PCI & stent 03/04/24. PMH: HTN, DM, CKD 3A, possible Parkinson's & trigeminal neuralgia, BPH, craniotomy 2/2 traumatic SDH   OT comments  Pt seen for OT treatment on this date. Upon arrival to room pt seated in chair, agreeable to tx session targeting improving functional activity tolerance in prep for ADL tasks. Pt is making progress towards goals as evidenced by amb in room with RW with MIN A +2 with close chair follow. Frequent verbal/tactile cues for pacing and technique. Occasional knee buckling noted. Provided red built up utensils to assist with self feeding. Pt able to bring cup to mouth with supervision. Pt continues to perform ADL/functional mobility below PLOF, will benefit from acute OT to address functional deficits and to facilitate optimal ADL/functional mobility engagement. VSS.      If plan is discharge home, recommend the following:  Two people to help with walking and/or transfers;Two people to help with bathing/dressing/bathroom;Supervision due to cognitive status   Equipment Recommendations  Other (comment) (sara stedy/STS Lift if pt discharges home)    Recommendations for Other Services      Precautions / Restrictions Precautions Recall of Precautions/Restrictions: Impaired       Mobility Bed Mobility Overal bed mobility: Needs Assistance (Simultaneous filing. User may not have seen previous data.)                  Transfers Overall transfer level: Needs assistance  Equipment used: Rolling walker (2 wheels)  Transfers: Sit to/from Stand  Sit to Stand: Min assist, +2 physical assistance, +2 safety/equipment, From elevated surface                  Balance  Overall balance assessment: Needs assistance  Sitting-balance support: Feet supported, Bilateral upper extremity supported  Sitting balance-Leahy Scale: Fair    Standing balance: Poor                                  ADL either performed or assessed with clinical judgement   ADL Overall ADL's : Needs assistance/impaired Eating/Feeding: Supervision/ safety Eating/Feeding Details (indicate cue type and reason): bring cup to mouth, provided built up handle for utensils as pt reported he uses these at home                 Lower Body Dressing: Maximal assistance   Toilet Transfer: Minimal assistance;+2 for physical assistance;+2 for safety/equipment;Ambulation;Rolling walker (2 wheels) (simulated approx 37ft), frequent cueing for technique/safety            Functional mobility during ADLs: +2 for physical assistance;MINA A Rolling walker (2 wheels) General ADL Comments: frequent verbal and tactile curing for pacing and technique    Extremity/Trunk Assessment              Vision       Perception     Praxis     Communication Communication Communication:Impaired Factors Affecting Communication: Hearing impaired   Cognition Arousal: Alert  Behavior During Therapy: WFL for tasks assessed/performed (Cognition: Cognition impaired         Attention impairment (select first level of impairment): Sustained attention Executive functioning impairment (select all impairments): Reasoning, Problem  solving, Sequencing OT - Cognition Comments: will continue to assess                 Following commands: Intact  Following commands impaired: Follows one step commands with increased time      Cueing   Cueing Techniques: Verbal cues, Tactile cues, Visual cues (Simultaneous filing. User may not have seen previous data.)  Exercises      Shoulder Instructions       General Comments vss    Pertinent Vitals/ Pain       Pain Assessment Pain Assessment:  No/denies pain   Home Living                                          Prior Functioning/Environment              Frequency  Min 2X/week        Progress Toward Goals  OT Goals(current goals can now be found in the care plan section)     Acute Rehab OT Goals OT Goal Formulation: With patient/family Potential to Achieve Goals: Fair  Plan      Co-evaluation    PT/OT/SLP Co-Evaluation/Treatment: Yes Reason for Co-Treatment: To address functional/ADL transfers          AM-PAC OT 6 Clicks Daily Activity     Outcome Measure   Help from another person eating meals?: A Little Help from another person taking care of personal grooming?: A Little Help from another person toileting, which includes using toliet, bedpan, or urinal?: A Lot Help from another person bathing (including washing, rinsing, drying)?: A Lot Help from another person to put on and taking off regular upper body clothing?: A Little Help from another person to put on and taking off regular lower body clothing?: A Lot 6 Click Score: 15    End of Session Equipment Utilized During Treatment: Gait belt;Rolling walker (2 wheels)  OT Visit Diagnosis: Other abnormalities of gait and mobility (R26.89);Muscle weakness (generalized) (M62.81)   Activity Tolerance Patient tolerated treatment well   Patient Left in chair;with call bell/phone within reach;with chair alarm set   Nurse Communication          Time: 8862-8796 OT Time Calculation (min): 26 min  Charges: OT General Charges $OT Visit: 1 Visit OT Treatments $Therapeutic Activity: 8-22 mins  Jack White OTS   Ronita Sauers 03/11/2024, 2:06 PM

## 2024-03-11 NOTE — TOC Progression Note (Addendum)
 Transition of Care [facility]) - Progression Note    Patient Details  Name: Jack White MRN: [id number] Date of Birth: March 18, 1940  Transition of Care [facility] CM/SW Contact  Lauraine [name], LCSW Phone Number: 03/11/2024, 11:50 AM  Clinical Narrative:   Daughter had mentioned need of a bed extender if patient goes to SNF. [facility] has offered a bed and confirmed they have these. [facility] is reviewing the referral and will check to see if they have them. [facility] declined.  2:41 pm: Decision still pending from [facility]. Left message for admissions coordinator.  2:59 pm: [facility] can offer a bed and their beds extend. They do have COVID in the building and only have semiprivate rooms. Daughter is aware. She is going to call [facility] to discuss. Daughter plans to tour [facility] and [facility] before making decision.  4:21 pm: Daughter left CSW a voicemail accepting the bed offer from [facility]. CSW started auth in preparation for discharge tomorrow or Monday.  Expected Discharge Plan: Home/Self Care Barriers to Discharge: Continued Medical Work up               Expected Discharge Plan and Services     Post Acute Care Choice: NA Living arrangements for the past 2 months: Single Family Home                                       Social Drivers of Health (SDOH) Interventions SDOH Screenings   Food Insecurity: No Food Insecurity (03/03/2024)  Housing: Low Risk (03/03/2024)  Transportation Needs: No Transportation Needs (03/03/2024)  Utilities: Not At Risk (03/03/2024)  Financial Resource Strain: High Risk (10/07/2023)   Received from [facility]  Social Connections: Moderately Integrated (03/03/2024)  Tobacco Use: Low Risk (12/09/2023)    Readmission Risk Interventions    03/05/2024   12:58 PM  Readmission Risk Prevention Plan  Transportation Screening Complete  PCP or Specialist Appt within 3-5 Days Complete  Social Work Consult for Recovery Care  Planning/Counseling Complete  Palliative Care Screening Not Applicable  Medication Review [employer]) Complete

## 2024-03-11 NOTE — Plan of Care (Signed)
  Problem: Education: Goal: Understanding of cardiac disease, CV risk reduction, and recovery process will improve Outcome: Progressing   Problem: Cardiac: Goal: Ability to achieve and maintain adequate cardiovascular perfusion will improve Outcome: Progressing   Problem: Health Behavior/Discharge Planning: Goal: Ability to safely manage health-related needs after discharge will improve Outcome: Progressing   Problem: Education: Goal: Knowledge of General Education information will improve Description: Including pain rating scale, medication(s)/side effects and non-pharmacologic comfort measures Outcome: Progressing

## 2024-03-11 NOTE — Progress Notes (Signed)
 " [facility] CARDIOLOGY PROGRESS NOTE       Patient ID: Jack White MRN: [id number] DOB/AGE: 1940/08/14 84 y.o.  Admit date: 03/02/2024 Referring Physician Dr. Delayne Solian Primary Physician Diedra Lame, MD  Primary Cardiologist none Reason for Consultation chest pain  HPI: Jack White is a 84 y.o. male  with a past medical history of hypertension, type 2 diabetes, CKD stage IIIa, BPH, history of traumatic subdural hematoma s/p craniotomy who presented to the ED on 03/02/2024 for shortness of breath and chest pain. Cardiology was consulted for further evaluation.   Interval history: -Patient seen and examined this AM, resting in hospital bed eating breakfast. - Left arm bruises stable. - BP stable.  No events noted on telemetry. - Hgb stable today.  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 Lucien   COLOSTOMY TAKEDOWN N/A 10/26/2014   Procedure: COLOSTOMY TAKEDOWN;  Surgeon: Elgin Laurence III, MD;  Location: [facility] ORS;  Service: General;  Laterality: N/A;   CORONARY STENT INTERVENTION N/A 03/04/2024   Procedure: CORONARY STENT INTERVENTION;  Surgeon: Florencio Cara BIRCH, MD;  Location: [facility] INVASIVE CV LAB;  Service: Cardiovascular;  Laterality: N/A;   CRANIOTOMY Right 07/21/2023   Procedure: CRANIOTOMY HEMATOMA EVACUATION SUBDURAL;  Surgeon: Lanis Pupa, MD;   Location: [facility] OR;  Service: Neurosurgery;  Laterality: Right;   EXPLORATORY LAPAROTOMY  05/28/2014   Dr. Lucien   LAPAROSCOPIC CHOLECYSTECTOMY  05/21/2014   Dr. Laurence   LEFT HEART CATH AND CORONARY ANGIOGRAPHY N/A 03/04/2024   Procedure: LEFT HEART CATH AND CORONARY ANGIOGRAPHY;  Surgeon: Florencio Cara BIRCH, MD;  Location: [facility] INVASIVE CV LAB;  Service: Cardiovascular;  Laterality: N/A;    Medications Prior to Admission  Medication Sig Dispense Refill Last Dose/Taking   acetaminophen  (TYLENOL ) 500 MG tablet Take 500-1,000 mg by mouth 2 (two) times daily.   Unknown   atorvastatin  (LIPITOR) 10 MG tablet Take 1 tablet (10 mg total) by mouth at bedtime.   03/01/2024 Bedtime   butalbital -aspirin -caffeine  (FIORINAL) 50-325-40 MG capsule TAKE 1 CAPSULE BY MOUTH EVERY 6 HOURS AS NEEDED FOR HEADACHE   Unknown   carbamazepine  (TEGRETOL ) 200 MG tablet Take 1 tablet (200 mg total) by mouth 3 (three) times daily.   03/02/2024 Morning   diphenhydrAMINE  (BENADRYL ) 25 MG tablet Take 2 tablets (50 mg total) by mouth at bedtime.   03/01/2024 Bedtime   doxazosin  (CARDURA ) 4 MG tablet Take 4 mg by mouth daily.   03/02/2024 Morning   DULoxetine  (CYMBALTA ) 60 MG capsule Take 60 mg by mouth daily.   03/02/2024 Morning   ergocalciferol  (VITAMIN D2) 1.25 MG (50000 UT) capsule Take 50,000 Units by mouth once a week.   Past Week   ferrous sulfate  325 (65 FE) MG tablet Take 1 tablet (325 mg total) by mouth daily with breakfast.  03/02/2024 Morning   fluticasone  (FLONASE ) 50 MCG/ACT nasal spray Place 2 sprays into both nostrils daily as needed for allergies.   Unknown   folic acid  (FOLVITE ) 1 MG tablet Take 1 tablet (1 mg total) by mouth daily.   03/02/2024 Morning   gabapentin  (NEURONTIN ) 300 MG capsule Take 300 mg by mouth at bedtime.   03/01/2024 Bedtime   guaiFENesin -dextromethorphan (ROBITUSSIN DM) 100-10 MG/5ML syrup Take 5 mLs by mouth every 4 (four) hours as needed for cough. 118 mL 0 Unknown   hydrocortisone  cream 1 % Apply 1  Application topically 3 (three) times daily as needed for itching.   Unknown   hydroxypropyl methylcellulose / hypromellose (ISOPTO TEARS / GONIOVISC) 2.5 % ophthalmic solution Place 2 drops into both eyes 4 (four) times daily as needed for dry eyes.   Unknown   ipratropium-albuterol  (DUONEB) 0.5-2.5 (3) MG/3ML SOLN Take 3 mLs by nebulization every 6 (six) hours as needed (wheezing).   Unknown   melatonin 10 MG TABS Take 10 mg by mouth at bedtime.   03/01/2024 Bedtime   metFORMIN  (GLUCOPHAGE -XR) 500 MG 24 hr tablet Take 500 mg by mouth daily.   03/02/2024 Morning   modafinil  (PROVIGIL ) 100 MG tablet Take 1 tablet (100 mg total) by mouth daily. 10 tablet 0 03/02/2024 Morning   montelukast  (SINGULAIR ) 10 MG tablet Take 10 mg by mouth at bedtime.   03/01/2024 Bedtime   omeprazole (PRILOSEC) 20 MG capsule Take 20 mg by mouth daily.   03/02/2024 Morning   polyethylene glycol (MIRALAX  / GLYCOLAX ) 17 g packet Take 17 g by mouth daily as needed.   Unknown   propranolol  (INDERAL ) 20 MG tablet Take 20 mg by mouth 2 (two) times daily.   03/02/2024 Morning   sennosides (SENOKOT) 8.8 MG/5ML syrup Take 5 mLs by mouth at bedtime.   03/01/2024 Bedtime   [EXPIRED] tamsulosin  (FLOMAX ) 0.4 MG CAPS capsule Take 0.4 mg by mouth daily after breakfast.   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  Tobacco Use   Smoking status: Never   Smokeless tobacco: Never  Vaping Use   Vaping status: Never Used  Substance and Sexual Activity   Alcohol  use: No    Alcohol /week: 0.0 standard drinks of alcohol    Drug use: No   Sexual activity: Not on file   Other Topics Concern   Not on file  Social History Narrative   Not on file   Social Drivers of Health   Tobacco Use: Low Risk (12/09/2023)   Patient History    Smoking Tobacco Use: Never    Smokeless Tobacco Use: Never    Passive Exposure: Not on file  Financial Resource Strain: High Risk (10/07/2023)   Received from [facility]   Overall Financial Resource Strain (CARDIA)    How hard is it for you to pay for the very basics like food, housing, medical care, and heating?: Very hard  Food Insecurity: No Food Insecurity (03/03/2024)   Epic    Worried About Running Out of Food in the Last Year: Never true  Ran Out of Food in the Last Year: Never true  Transportation Needs: No Transportation Needs (03/03/2024)   Epic    Lack of Transportation (Medical): No    Lack of Transportation (Non-Medical): No  Physical Activity: Not on file  Stress: Not on file  Social Connections: Moderately Integrated (03/03/2024)   Social Connection and Isolation Panel    Frequency of Communication with Friends and Family: Three times a week    Frequency of Social Gatherings with Friends and Family: Three times a week    Attends Religious Services: More than 4 times per year    Active Member of Clubs or Organizations: Yes    Attends Banker Meetings: 1 to 4 times per year    Marital Status: Widowed  Intimate Partner Violence: Not At Risk (03/03/2024)   Epic    Fear of Current or Ex-Partner: No    Emotionally Abused: No    Physically Abused: No    Sexually Abused: No  Depression (PHQ2-9): Not on file  Alcohol  Screen: Not on file  Housing: Low Risk (03/03/2024)   Epic    Unable to Pay for Housing in the Last Year: No    Number of Times Moved in the Last Year: 0    Homeless in the Last Year: No  Utilities: Not At Risk (03/03/2024)   Epic    Threatened with loss of utilities: No  Health Literacy: Not on file    Family History  Problem Relation Age of Onset   Diabetes Mother     Hypertension Mother    Alzheimer's disease Mother    Heart disease Father    Diabetes Sister      Vitals:   03/10/24 2006 03/10/24 2007 03/11/24 0024 03/11/24 0423  BP: 130/81 130/81 124/67 (!) 122/59  Pulse: 88 88 76 75  Resp: 20 20 20 20   Temp: 98 F (36.7 C) 98 F (36.7 C) 97.9 F (36.6 C) 97.9 F (36.6 C)  TempSrc:      SpO2: 94% 94% 93% 96%  Weight:        PHYSICAL EXAM General: Chronically ill-appearing elderly male, well nourished, in no acute distress. HEENT: Normocephalic and atraumatic. Neck: No JVD.  Lungs: Normal respiratory effort on room air. Clear bilaterally to auscultation. No wheezes, crackles, rhonchi.  Heart: HRRR. Normal S1 and S2 without gallops or murmurs.  Abdomen: Non-distended appearing.  Msk: Normal strength and tone for age. Extremities: Warm and well perfused. No clubbing, cyanosis.  No edema.  Neuro: Alert and oriented X 3. Psych: Answers questions appropriately.   Labs: Basic Metabolic Panel: Recent Labs    03/09/24 0502 03/09/24 1401 03/10/24 0411 03/11/24 0448  NA 136  --  136 137  K 5.5*   < > 5.3* 5.4*  CL 105  --  103 103  CO2 26  --  25 29  GLUCOSE 174*  --  173* 145*  BUN 51*  --  42* 36*  CREATININE 1.66*  --  1.45* 1.44*  CALCIUM  7.7*  --  8.3* 8.3*  MG 2.8*  --   --   --    < > = values in this interval not displayed.   Liver Function Tests: No results for input(s): AST, ALT, ALKPHOS, BILITOT, PROT, ALBUMIN  in the last 72 hours. No results for input(s): LIPASE, AMYLASE in the last 72 hours.  CBC: Recent Labs    03/10/24 0411 03/11/24 0448  WBC 5.1 5.6  HGB 8.3* 8.2*  HCT 24.5*  25.2*  MCV 95.3 98.8  PLT 170 176   Cardiac Enzymes: No results for input(s): CKTOTAL, CKMB, CKMBINDEX, TROPONINIHS in the last 72 hours. BNP: No results for input(s): BNP in the last 72 hours. D-Dimer: No results for input(s): DDIMER in the last 72 hours.  Hemoglobin A1C: No results for input(s):  HGBA1C in the last 72 hours. Fasting Lipid Panel: No results for input(s): CHOL, HDL, LDLCALC, TRIG, CHOLHDL, LDLDIRECT in the last 72 hours. Thyroid  Function Tests: No results for input(s): TSH, T4TOTAL, T3FREE, THYROIDAB in the last 72 hours.  Invalid input(s): FREET3 Anemia Panel: Recent Labs    03/09/24 0502 03/09/24 0738  VITAMINB12  --  403  FERRITIN 48  --   TIBC 298  --   IRON  42*  --      Radiology: CARDIAC CATHETERIZATION Result Date: 03/08/2024   Mid LAD lesion is 95% stenosed.   A drug-eluting stent was successfully placed using a STENT ONYX FRONTIER 2.75X15.   Post intervention, there is a 0% residual stenosis.   The left ventricular systolic function is normal.   LV end diastolic pressure is normal.   The left ventricular ejection fraction is 55-65% by visual estimate.   There is no mitral valve regurgitation.   In the absence of any other complications or medical issues, we expect the patient to be ready for discharge from an interventional cardiology perspective.   Recommend uninterrupted dual antiplatelet therapy with Aspirin  81mg  daily and Clopidogrel  75mg  daily for a minimum of 12 months (ACS-Class I recommendation). Conclusion Inpatient left heart cath right radial approach because of positive imaging angina 6 French sheath placed in right radial artery Left ventriculogram showed normal left ventricular function EF of 55% Coronaries Left main large free of disease LAD large 25% proximal 95% mid focal Circumflex very large left dominant free of disease RCA small nondominant Intervention Successful PCI and stent of mid LAD with deployment of a 2.5 x 15 mm frontier Onyx postdilated with a 275 x 12 mm [place] neo up to 16 atm Lesion reduced from 95 down to 0 TIMI-3 flow maintained throughout Patient tolerated seizure well No complication Patient maintained on aspirin  and Plavix  for at least 6 months Anticipate discharge within 48 to 72 hours   CT HEAD WO  CONTRAST ( ) Result Date: 03/08/2024 EXAM: CT HEAD WITHOUT CONTRAST 03/08/2024 01:58:26 PM TECHNIQUE: CT of the head was performed without the administration of intravenous contrast. Automated exposure control, iterative reconstruction, and/or weight based adjustment of the mA/kV was utilized to reduce the radiation dose to as low as reasonably achievable. COMPARISON: None available. CLINICAL HISTORY: Neuro deficit, acute, stroke suspected Neuro deficit, acute, stroke suspected FINDINGS: BRAIN AND VENTRICLES: No acute hemorrhage. No evidence of acute infarct. 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 No hydrocephalus. No extra-axial collection. No mass effect or midline shift. Previous right frontal craniotomy with stable underlying postsurgical changes. ORBITS: No acute abnormality. SINUSES: No acute abnormality. SOFT TISSUES AND SKULL: No acute soft tissue abnormality. No skull fracture. IMPRESSION: 1. No acute intracranial abnormality. 2. Previous right frontal craniotomy with stable underlying postsurgical changes. Electronically signed by: Gilmore Molt MD MD 03/08/2024 02:38 PM EST RP Workstation: [id number]   ECHOCARDIOGRAM COMPLETE Result Date: 03/05/2024    ECHOCARDIOGRAM REPORT   Patient Name:   VERNOR MONNIG Date of Exam: 03/04/2024 Medical Rec #:  [id number]       Height:       75.0 in Accession #:  [id number]      Weight:       205.2 lb Date of Birth:  09-Jun-1940      BSA:          2.219 m Patient Age:    83 years        BP:           95/53 mmHg Patient Gender: M               HR:           56 bpm. Exam Location:  [facility] Procedure: 2D Echo, Cardiac Doppler, Color Doppler and Strain Analysis (Both            Spectral and Color Flow Doppler were utilized during procedure). Indications:     Chest pain R07.9  History:         Patient has no prior history of Echocardiogram examinations.                  Previous Myocardial Infarction; Risk Factors:Hypertension.   Sonographer:     Christopher Furnace Referring Phys:  [id number] DELAYNE LULLA SOLIAN Diagnosing Phys: Cara JONETTA Lovelace MD  Sonographer Comments: Suboptimal parasternal window. Global longitudinal strain was attempted. IMPRESSIONS  1. Left ventricular ejection fraction, by estimation, is 55 to 60%. The left ventricle has normal function. The left ventricle has no regional wall motion abnormalities. There is moderate concentric left ventricular hypertrophy. Left ventricular diastolic parameters are consistent with Grade I diastolic dysfunction (impaired relaxation). The average left ventricular global longitudinal strain is 16.3 %. The global longitudinal strain is normal.  2. Right ventricular systolic function is normal. The right ventricular size is normal.  3. The mitral valve is normal in structure. Trivial mitral valve regurgitation.  4. The aortic valve is normal in structure. Aortic valve regurgitation is not visualized. Aortic valve sclerosis/calcification is present, without any evidence of aortic stenosis. FINDINGS  Left Ventricle: Left ventricular ejection fraction, by estimation, is 55 to 60%. The left ventricle has normal function. The left ventricle has no regional wall motion abnormalities. The average left ventricular global longitudinal strain is 16.3 %. Strain was performed and the global longitudinal strain is normal. The left ventricular internal cavity size was normal in size. There is moderate concentric left ventricular hypertrophy. Left ventricular diastolic parameters are consistent with Grade I diastolic dysfunction (impaired relaxation). Right Ventricle: The right ventricular size is normal. No increase in right ventricular wall thickness. Right ventricular systolic function is normal. Left Atrium: Left atrial size was normal in size. Right Atrium: Right atrial size was normal in size. Pericardium: There is no evidence of pericardial effusion. Mitral Valve: The mitral valve is normal in structure. Trivial  mitral valve regurgitation. MV peak gradient, 4.2 mmHg. The mean mitral valve gradient is 1.0 mmHg. Tricuspid Valve: The tricuspid valve is normal in structure. Tricuspid valve regurgitation is trivial. Aortic Valve: The aortic valve is normal in structure. Aortic valve regurgitation is not visualized. Aortic valve sclerosis/calcification is present, without any evidence of aortic stenosis. Aortic valve mean gradient measures 4.0 mmHg. Aortic valve peak  gradient measures 6.9 mmHg. Aortic valve area, by VTI measures 1.86 cm. Pulmonic Valve: The pulmonic valve was normal in structure. Pulmonic valve regurgitation is not visualized. Aorta: The aortic root was not well visualized. IAS/Shunts: No atrial level shunt detected by color flow Doppler. Additional Comments: 3D was performed not requiring image post processing on an independent workstation and was indeterminate.  LEFT VENTRICLE PLAX 2D LVIDd:  4.30 cm   Diastology LVIDs:         3.00 cm   LV e' medial:    4.79 cm/s LV PW:         1.40 cm   LV E/e' medial:  13.0 LV IVS:        1.50 cm   LV e' lateral:   8.59 cm/s LVOT diam:     2.20 cm   LV E/e' lateral: 7.3 LV SV:         53 LV SV Index:   24        2D Longitudinal Strain LVOT Area:     3.80 cm  2D Strain GLS (A4C):   18.0 % LV IVRT:       108 msec  2D Strain GLS (A3C):   16.2 %                          2D Strain GLS (A2C):   14.8 %                          2D Strain GLS Avg:     16.3 % RIGHT VENTRICLE RV Basal diam:  4.10 cm RV Mid diam:    3.30 cm RV S prime:     13.10 cm/s TAPSE (M-mode): 2.0 cm LEFT ATRIUM             Index        RIGHT ATRIUM           Index LA diam:        3.10 cm 1.40 cm/m   RA Area:     21.60 cm LA Vol (A2C):   23.1 ml 10.41 ml/m  RA Volume:   63.50 ml  28.62 ml/m LA Vol (A4C):   25.3 ml 11.40 ml/m LA Biplane Vol: 25.2 ml 11.36 ml/m  AORTIC VALVE AV Area (Vmax):    1.91 cm AV Area (Vmean):   1.83 cm AV Area (VTI):     1.86 cm AV Vmax:           131.67 cm/s AV Vmean:           93.933 cm/s AV VTI:            0.284 m AV Peak Grad:      6.9 mmHg AV Mean Grad:      4.0 mmHg LVOT Vmax:         66.30 cm/s LVOT Vmean:        45.300 cm/s LVOT VTI:          0.139 m LVOT/AV VTI ratio: 0.49  AORTA Ao Root diam: 3.00 cm MITRAL VALVE               TRICUSPID VALVE MV Area (PHT): 1.85 cm    TR Peak grad:   12.0 mmHg MV Area VTI:   1.87 cm    TR Vmax:        173.00 cm/s MV Peak grad:  4.2 mmHg MV Mean grad:  1.0 mmHg    SHUNTS MV Vmax:       1.02 m/s    Systemic VTI:  0.14 m MV Vmean:      55.6 cm/s   Systemic Diam: 2.20 cm MV Decel Time: 410 msec MV E velocity: 62.40 cm/s MV A velocity: 91.20 cm/s MV E/A ratio:  0.68 Dwayne D Callwood MD Electronically signed by Cara  JONETTA Lovelace MD Signature Date/Time: 03/05/2024/2:51:30 PM    Final    NM Myocar Multi W/Spect Marisela Motion / EF Result Date: 03/03/2024   Findings are consistent with ischemia. The study is high risk.   No ST deviation was noted.   LV perfusion is abnormal. There is evidence of ischemia. There is no evidence of infarction. Defect 1: There is a medium defect with moderate reduction in uptake present in the mid to basal inferolateral and lateral location(s) that is reversible. There is normal wall motion in the defect area. Consistent with ischemia.   Left ventricular function is normal. End diastolic cavity size is normal.   CT images were obtained for attenuation correction and were examined for the presence of coronary calcium  when appropriate. Conclusion Abnormal myocardial perfusion scan large area of lateral lateral basal evidence of ischemia moderate intensity Ejection fraction normal around 58% with normal wall motion Consider further evaluation possibly invasively to rule out significant obstructive disease   CT Angio Chest PE W/Cm &/Or Wo Cm Result Date: 03/03/2024 EXAM: CTA of the Chest with contrast for PE 03/03/2024 12:26:56 AM TECHNIQUE: CTA of the chest was performed without and with the administration of 75 mL of  iohexol  (OMNIPAQUE ) 350 MG/ML injection. Multiplanar reformatted images are provided for review. MIP images are provided for review. Automated exposure control, iterative reconstruction, and/or weight based adjustment of the mA/kV was utilized to reduce the radiation dose to as low as reasonably achievable. COMPARISON: 07/27/2023 CLINICAL HISTORY: Pulmonary embolism (PE) suspected, low to intermediate prob, positive D-dimer. FINDINGS: PULMONARY ARTERIES: Pulmonary arteries are adequately opacified for evaluation. No pulmonary embolism. Main pulmonary artery is normal in caliber. MEDIASTINUM: Coronary artery and aortic atherosclerosis. LYMPH NODES: No mediastinal, hilar or axillary lymphadenopathy. LUNGS AND PLEURA: Scarring in the lingula and right middle lobe. No acute confluent airspace opacities. No pulmonary edema. No pleural effusion or pneumothorax. UPPER ABDOMEN: Limited images of the upper abdomen are unremarkable. SOFT TISSUES AND BONES: No acute bone or soft tissue abnormality. IMPRESSION: 1. No pulmonary embolism. 2. No acute pulmonary abnormality. 3. Coronary artery disease. Aortic atherosclerosis. Electronically signed by: Franky Crease MD 03/03/2024 12:33 AM EST RP Workstation: [id number]   CT Head Wo Contrast Result Date: 03/02/2024 CLINICAL DATA:  Fall 2 weeks ago.  Episode of emesis today. EXAM: CT HEAD WITHOUT CONTRAST TECHNIQUE: Contiguous axial images were obtained from the base of the skull through the vertex without intravenous contrast. RADIATION DOSE REDUCTION: This exam was performed according to the departmental dose-optimization program which includes automated exposure control, adjustment of the mA and/or kV according to patient size and/or use of iterative reconstruction technique. COMPARISON:  09/19/2023, 07/21/2023 FINDINGS: Brain: Ventricles, cisterns and other CSF spaces are unchanged and within normal. There is minimal chronic ischemic microvascular disease present. No mass, mass  effect, shift of midline structures or acute hemorrhage. Postsurgical/posttraumatic sequelae with thickening/calcification of the right frontal subdural region unchanged from 09/19/2023. This directly underlies the right frontal craniotomy defect. Vascular: No hyperdense vessel or unexpected calcification. Skull: Evidence of previous right frontal craniectomy. Sinuses/Orbits: Hypoplastic frontal sinuses as the paranasal sinuses are otherwise clear. Mastoid air cells are clear. Orbits are normal. Other: None. IMPRESSION: 1. No acute findings. 2. Minimal chronic ischemic microvascular disease. 3. Previous right frontal craniotomy with stable underlying postsurgical changes. Electronically Signed   By: Toribio Agreste M.D.   On: 03/02/2024 16:31   DG Chest 2 View Result Date: 03/02/2024 EXAM: 2 VIEW(S) XRAY OF THE CHEST 03/02/2024 03:51:00 PM COMPARISON:  08/31/2023 CLINICAL HISTORY: CP FINDINGS: LUNGS AND PLEURA: Low lung volumes. No focal pulmonary opacity. No pleural effusion. No pneumothorax. HEART AND MEDIASTINUM: Cardiomegaly. Tortuous thoracic aorta. Atherosclerotic calcifications. BONES AND SOFT TISSUES: Multilevel degenerative changes of thoracic spine. No acute osseous abnormality. IMPRESSION: 1. No acute findings. 2. Cardiomegaly. Electronically signed by: Greig Pique MD 03/02/2024 04:29 PM EST RP Workstation: [id number]    ECHO as above  TELEMETRY (personally reviewed): Sinus rhythm rate 80s  EKG (personally reviewed): Normal sinus rhythm rate 63 bpm  Data reviewed by me 03/11/2024: last 24h vitals tele labs imaging I/O ED provider note, admission H&P, hospitalist progress note  Principal Problem:   Precordial chest pain Active Problems:   Stage 3a chronic kidney disease (HCC)   Diabetes mellitus, type 2 (HCC)   Hypertension   Hyperkalemia   Parkinson disease (HCC)   Trigeminal neuralgia   BPH (benign prostatic hyperplasia)   History of traumatic subdural hematoma s/p craniotomy   Chest  pain   Thrombocytopenia   Unstable angina (HCC)   Iron  deficiency anemia    ASSESSMENT AND PLAN:  Jack White is a 84 y.o. male  with a past medical history of hypertension, type 2 diabetes, CKD stage IIIa, BPH, history of traumatic subdural hematoma s/p craniotomy who presented to the ED on 03/02/2024 for shortness of breath and chest pain. Cardiology was consulted for further evaluation.   # Coronary artery disease s/p DES # Hypertension # Chronic kidney disease stage IIIa Patient presented with complaints of shortness of breath and chest discomfort after an episode of vomiting yesterday.  Troponins are minimal at 23 and 24.  Chest x-ray without acute abnormality.  EKG without acute ischemic changes.  Overall feeling improved today. Nuclear stress 03/03/24 with large area of lateral basal evidence of ischemia moderate intensity.  LHC 03/04/2024 revealed mid LAD lesion s/p DES.  Echo this admission with EF 55-60%, no wall motion abnormalities, moderate concentric LVH, grade 1 diastolic dysfunction.  - Continue atorvastatin  40 mg daily.  Continue aspirin  81 mg daily, Plavix  75 mg daily for DAPT. - Home propranolol  was discontinued by primary team. - Currently pending further PT eval, possible SNF placement.  Cardiology will sign off. Please haiku with questions or re-engage if needed. Follow up with Dr. Wilburn in 1-2 weeks.   This patient's plan of care was discussed and created with Dr. Florencio and he is in agreement.  Signed: Danita Bloch, PA-C  03/11/2024, 9:05 AM [facility] Cardiology      "

## 2024-03-11 NOTE — Plan of Care (Signed)

## 2024-03-12 DIAGNOSIS — R072 Precordial pain: Secondary | ICD-10-CM | POA: Diagnosis not present

## 2024-03-12 LAB — BASIC METABOLIC PANEL WITH GFR
Anion gap: 7 (ref 5–15)
BUN: 37 mg/dL — ABNORMAL HIGH (ref 8–23)
CO2: 26 mmol/L (ref 22–32)
Calcium: 7.7 mg/dL — ABNORMAL LOW (ref 8.9–10.3)
Chloride: 101 mmol/L (ref 98–111)
Creatinine, Ser: 1.46 mg/dL — ABNORMAL HIGH (ref 0.61–1.24)
GFR, Estimated: 47 mL/min — ABNORMAL LOW
Glucose, Bld: 164 mg/dL — ABNORMAL HIGH (ref 70–99)
Potassium: 5.3 mmol/L — ABNORMAL HIGH (ref 3.5–5.1)
Sodium: 133 mmol/L — ABNORMAL LOW (ref 135–145)

## 2024-03-12 LAB — CBC
HCT: 24 % — ABNORMAL LOW (ref 39.0–52.0)
Hemoglobin: 7.8 g/dL — ABNORMAL LOW (ref 13.0–17.0)
MCH: 32.2 pg (ref 26.0–34.0)
MCHC: 32.5 g/dL (ref 30.0–36.0)
MCV: 99.2 fL (ref 80.0–100.0)
Platelets: 188 K/uL (ref 150–400)
RBC: 2.42 MIL/uL — ABNORMAL LOW (ref 4.22–5.81)
RDW: 13.6 % (ref 11.5–15.5)
WBC: 5.7 K/uL (ref 4.0–10.5)
nRBC: 0 % (ref 0.0–0.2)

## 2024-03-12 MED ORDER — LACTULOSE 10 GM/15ML PO SOLN
30.0000 g | Freq: Once | ORAL | Status: AC
  Administered 2024-03-12: 30 g via ORAL
  Filled 2024-03-12: qty 60

## 2024-03-12 MED ORDER — POLYETHYLENE GLYCOL 3350 17 G PO PACK
17.0000 g | PACK | Freq: Every day | ORAL | Status: DC
  Administered 2024-03-12 – 2024-03-13 (×2): 17 g via ORAL
  Filled 2024-03-12 (×2): qty 1

## 2024-03-12 MED ORDER — SODIUM ZIRCONIUM CYCLOSILICATE 10 G PO PACK
10.0000 g | PACK | Freq: Every day | ORAL | Status: DC
  Administered 2024-03-12 – 2024-03-15 (×4): 10 g via ORAL
  Filled 2024-03-12 (×4): qty 1

## 2024-03-12 NOTE — Progress Notes (Signed)
 PT Cancellation Note  Patient Details Name: Jack White MRN: [id number] DOB: [birth year]   Cancelled Treatment:     Treatment attempt. Pt eating lunch. Acute PT will continue to follow per current POC. Will return at a later time/date.   [name] 03/12/2024, 1:47 PM

## 2024-03-12 NOTE — TOC Progression Note (Signed)
 Transition of Care [facility]) - Progression Note    Patient Details  Name: JASKARN SCHWEER MRN: [id number] Date of Birth: 01-May-1940  [facility] of [facility] CM/SW Contact  Shasta DELENA Daring, RN Phone Number: 03/12/2024, 10:47 AM  Clinical Narrative:    Called [employer] for update on auth.  Case still under nurse review.  Provided phone number for call back.    Expected Discharge Plan: Home/Self Care Barriers to Discharge: Continued Medical Work up               Expected Discharge Plan and Services     Post Acute Care Choice: NA Living arrangements for the past 2 months: Single Family Home                                       Social Drivers of Health (SDOH) Interventions SDOH Screenings   Food Insecurity: No Food Insecurity (03/03/2024)  Housing: Low Risk (03/03/2024)  Transportation Needs: No Transportation Needs (03/03/2024)  Utilities: Not At Risk (03/03/2024)  Financial Resource Strain: High Risk (10/07/2023)   Received from [facility]  Social Connections: Moderately Integrated (03/03/2024)  Tobacco Use: Low Risk (12/09/2023)    Readmission Risk Interventions    03/05/2024   12:58 PM  Readmission Risk Prevention Plan  Transportation Screening Complete  PCP or Specialist Appt within 3-5 Days Complete  Social Work Consult for Recovery Care Planning/Counseling Complete  Palliative Care Screening Not Applicable  Medication Review [employer]) Complete

## 2024-03-12 NOTE — Progress Notes (Signed)
 SPIRITUAL CARE AND COUNSELING CONSULT NOTE   VISIT SUMMARY     SPIRITUAL ENCOUNTER     Chaplain met with patient at direction of nurse to offer support and encouragement. Patient spent time discussing time in [place] after war and shared provacative stories. Chaplain redirected conversation and shared with nurse the patients confusion.                                                                                                                                                                  Type of Visit: Initial Care provided to:: Patient Referral source: Nurse (RN/NT/LPN) Reason for visit: Routine spiritual support OnCall Visit: No   SPIRITUAL FRAMEWORK  Presenting Themes: Coping tools Patient Stress Factors: None identified   GOALS       INTERVENTIONS        INTERVENTION OUTCOMES      SPIRITUAL CARE PLAN        If immediate needs arise,    Nanie Dunkleberger  03/12/2024 1:33 PM

## 2024-03-12 NOTE — Plan of Care (Signed)
  Problem: Education: Goal: Understanding of cardiac disease, CV risk reduction, and recovery process will improve Outcome: Progressing   Problem: Activity: Goal: Ability to tolerate increased activity will improve Outcome: Progressing   Problem: Education: Goal: Knowledge of General Education information will improve Description: Including pain rating scale, medication(s)/side effects and non-pharmacologic comfort measures Outcome: Progressing

## 2024-03-12 NOTE — Progress Notes (Signed)
" °  Progress Note   Patient: Jack White FMW:[id number] DOB: October 19, 1940 DOA: 03/02/2024     8 DOS: the patient was seen and examined on 03/12/2024   Brief hospital course:  GAHEL SAFLEY is a 84 y.o. 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  Patient has elevated D-dimer, CT angiogram was negative for PE. Patient also had a stress nuclear stress test, was positive for ischemia.  Patient had a heart cath performed on 1/8, placed LAD drug-eluting stent.   Principal Problem:   Precordial chest pain Active Problems:   Stage 3a chronic kidney disease (HCC)   Diabetes mellitus, type 2 (HCC)   Hypertension   Hyperkalemia   Parkinson disease (HCC)   Trigeminal neuralgia   BPH (benign prostatic hyperplasia)   History of traumatic subdural hematoma s/p craniotomy   Chest pain   Thrombocytopenia   Unstable angina (HCC)   Iron  deficiency anemia   Assessment and Plan: Unstable angina with coronary disease Minimal elevation troponin. Negative for PE, positive nuclear stress test.   Heart cath showed 95% stenosis in the LAD.  Status post drug-eluting stent, will continue dual antiplatelet treatment. Denies chest pain   Acute blood loss anemia. Iron  deficient anemia. Thrombocytopenia. Hgb now stable in the 8s after 1 unit, no bleeding, will monitor. 7.8 today if remains below 8 tomorrow will transfuse. No recent bms no other signs of bleeding   Stage 3a chronic kidney disease (HCC) Hyperkalemia Hyponatremia. Stable with mild hyperkalemia will continue lokelma , add lactulose   History of traumatic subdural hematoma s/p craniotomy No acute issues   BPH (benign prostatic hyperplasia) Continue tamsulosin    Parkinson disease (HCC) Trigeminal neuralgia Continue gabapentin  and carbamazepine  Resume  home duloxetine    Hypertension Continue home meds   Diabetes mellitus, type 2 (HCC) Sliding scale insulin  coverage   Generalized weakness. Pt advising snf, has bed, auth pending    Constipation Add miralax  and lactulose     Subjective:   Enjoying lunch, breathing comfortably  Physical Exam: Vitals:   03/12/24 0500 03/12/24 0600 03/12/24 0828 03/12/24 1213  BP: 135/65  116/68 (!) 105/54  Pulse: 78  68 78  Resp: 20 20 18 16   Temp: 98.4 F (36.9 C)  97.8 F (36.6 C) 97.6 F (36.4 C)  TempSrc: Oral     SpO2: 98%  98% 94%  Weight:       General exam: Appears calm and comfortable  Respiratory system: Clear to auscultation. Respiratory effort normal. Cardiovascular system: S1 & S2 heard, RRR.   Gastrointestinal system: Abdomen is nondistended, soft and nontender.   Central nervous system: somnolent but arousable Extremities: warm Skin: No rashes, lesions or ulcers Psychiatry: calm   Data Reviewed:  CT and lab results reviewed.  Family Communication: Daughter updated telephonically 1/16  Disposition: Status is: Inpatient Remains inpatient appropriate because: pending snf placement, auth pending  Author: Devaughn KATHEE Ban, MD 03/12/2024 1:45 PM  For on call review [email or web address].    "

## 2024-03-13 DIAGNOSIS — R072 Precordial pain: Secondary | ICD-10-CM | POA: Diagnosis not present

## 2024-03-13 LAB — CBC WITH DIFFERENTIAL/PLATELET
Abs Immature Granulocytes: 0.02 K/uL (ref 0.00–0.07)
Basophils Absolute: 0 K/uL (ref 0.0–0.1)
Basophils Relative: 0 %
Eosinophils Absolute: 0.5 K/uL (ref 0.0–0.5)
Eosinophils Relative: 7 %
HCT: 25.2 % — ABNORMAL LOW (ref 39.0–52.0)
Hemoglobin: 8.2 g/dL — ABNORMAL LOW (ref 13.0–17.0)
Immature Granulocytes: 0 %
Lymphocytes Relative: 23 %
Lymphs Abs: 1.5 K/uL (ref 0.7–4.0)
MCH: 32.3 pg (ref 26.0–34.0)
MCHC: 32.5 g/dL (ref 30.0–36.0)
MCV: 99.2 fL (ref 80.0–100.0)
Monocytes Absolute: 0.6 K/uL (ref 0.1–1.0)
Monocytes Relative: 10 %
Neutro Abs: 3.9 K/uL (ref 1.7–7.7)
Neutrophils Relative %: 60 %
Platelets: 192 K/uL (ref 150–400)
RBC: 2.54 MIL/uL — ABNORMAL LOW (ref 4.22–5.81)
RDW: 13.6 % (ref 11.5–15.5)
WBC: 6.5 K/uL (ref 4.0–10.5)
nRBC: 0 % (ref 0.0–0.2)

## 2024-03-13 LAB — HEPATIC FUNCTION PANEL
ALT: 33 U/L (ref 0–44)
AST: 28 U/L (ref 15–41)
Albumin: 2.9 g/dL — ABNORMAL LOW (ref 3.5–5.0)
Alkaline Phosphatase: 126 U/L (ref 38–126)
Bilirubin, Direct: 0.1 mg/dL (ref 0.0–0.2)
Indirect Bilirubin: 0.2 mg/dL — ABNORMAL LOW (ref 0.3–0.9)
Total Bilirubin: 0.3 mg/dL (ref 0.0–1.2)
Total Protein: 6 g/dL — ABNORMAL LOW (ref 6.5–8.1)

## 2024-03-13 LAB — BASIC METABOLIC PANEL WITH GFR
Anion gap: 5 (ref 5–15)
BUN: 40 mg/dL — ABNORMAL HIGH (ref 8–23)
CO2: 28 mmol/L (ref 22–32)
Calcium: 8 mg/dL — ABNORMAL LOW (ref 8.9–10.3)
Chloride: 99 mmol/L (ref 98–111)
Creatinine, Ser: 1.48 mg/dL — ABNORMAL HIGH (ref 0.61–1.24)
GFR, Estimated: 46 mL/min — ABNORMAL LOW
Glucose, Bld: 143 mg/dL — ABNORMAL HIGH (ref 70–99)
Potassium: 5.5 mmol/L — ABNORMAL HIGH (ref 3.5–5.1)
Sodium: 132 mmol/L — ABNORMAL LOW (ref 135–145)

## 2024-03-13 LAB — CBC
HCT: 24.1 % — ABNORMAL LOW (ref 39.0–52.0)
Hemoglobin: 8 g/dL — ABNORMAL LOW (ref 13.0–17.0)
MCH: 32.7 pg (ref 26.0–34.0)
MCHC: 33.2 g/dL (ref 30.0–36.0)
MCV: 98.4 fL (ref 80.0–100.0)
Platelets: 201 K/uL (ref 150–400)
RBC: 2.45 MIL/uL — ABNORMAL LOW (ref 4.22–5.81)
RDW: 13.4 % (ref 11.5–15.5)
WBC: 6.6 K/uL (ref 4.0–10.5)
nRBC: 0 % (ref 0.0–0.2)

## 2024-03-13 LAB — LACTATE DEHYDROGENASE: LDH: 139 U/L (ref 105–235)

## 2024-03-13 MED ORDER — LACTULOSE 10 GM/15ML PO SOLN
30.0000 g | Freq: Once | ORAL | Status: AC
  Administered 2024-03-13: 30 g via ORAL
  Filled 2024-03-13: qty 60

## 2024-03-13 MED ORDER — POLYETHYLENE GLYCOL 3350 17 G PO PACK
34.0000 g | PACK | Freq: Every day | ORAL | Status: DC
  Administered 2024-03-14 – 2024-03-17 (×4): 34 g via ORAL
  Filled 2024-03-13 (×5): qty 2

## 2024-03-13 NOTE — Progress Notes (Signed)
 Patients wedding ring placed in eye glass case. Daughter aware.

## 2024-03-13 NOTE — Consult Note (Signed)
 WOC consulted for sacral wound; see consultation note from 03/11/24, per K. Mayo NP WOC   Thanks  Jack Hughson [employer] MSN, RN,CWOCN, CNS, MAINE [phone]

## 2024-03-13 NOTE — Progress Notes (Signed)
" °  Progress Note   Patient: Jack White FMW:[id number] DOB: [birth year] DOA: 03/02/2024     9 DOS: the patient was seen and examined on 03/13/2024   Brief hospital course:  Jack White is a 84 y.o. 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  Patient has elevated D-dimer, CT angiogram was negative for PE. Patient also had a stress nuclear stress test, was positive for ischemia.  Patient had a heart cath performed on 1/8, placed LAD drug-eluting stent.   Principal Problem:   Precordial chest pain Active Problems:   Stage 3a chronic kidney disease (HCC)   Diabetes mellitus, type 2 (HCC)   Hypertension   Hyperkalemia   Parkinson disease (HCC)   Trigeminal neuralgia   BPH (benign prostatic hyperplasia)   History of traumatic subdural hematoma s/p craniotomy   Chest pain   Thrombocytopenia   Unstable angina (HCC)   Iron  deficiency anemia   Assessment and Plan: Unstable angina with coronary disease Minimal elevation troponin. Negative for PE, positive nuclear stress test.   Heart cath showed 95% stenosis in the LAD.  Status post drug-eluting stent, will continue dual antiplatelet treatment. Denies chest pain   Acute blood loss anemia. Iron  deficient anemia. Thrombocytopenia. Hgb now stable in the 8s after 1 unit. On chart review bm from 2 days ago charted as black/red though was never notified of it and nursing says they were also unaware. Nursing aware needs to carefully review any BMs   Stage 3a chronic kidney disease (HCC) Hyperkalemia Hyponatremia. Stable with mild hyperkalemia will continue lokelma , add lactulose   History of traumatic subdural hematoma s/p craniotomy No acute issues   BPH (benign prostatic hyperplasia) Continue tamsulosin    Parkinson disease (HCC) Trigeminal  neuralgia Continue gabapentin  and carbamazepine  Resume home duloxetine    Hypertension Continue home meds   Diabetes mellitus, type 2 (HCC) Sliding scale insulin  coverage   Generalized weakness. Pt advising snf, has bed, auth pending   Constipation Small bm overnight. Continue lactulose /miralax     Subjective:   Sitting in recliner, breathing comfortably, no complaints. Small bm last night  Physical Exam: Vitals:   03/13/24 0008 03/13/24 0347 03/13/24 0908 03/13/24 1152  BP: (!) 131/57 (!) 140/63 (!) 104/49 (!) 124/53  Pulse: 71 (!) 59 76 78  Resp: 20 19 20 18   Temp: 98.2 F (36.8 C) 98.2 F (36.8 C) 97.7 F (36.5 C) 97.9 F (36.6 C)  TempSrc:      SpO2: 98% 95% 98% 97%  Weight:       General exam: Appears calm and comfortable  Respiratory system: Clear to auscultation. Respiratory effort normal. Cardiovascular system: S1 & S2 heard, RRR.   Gastrointestinal system: Abdomen is nondistended, soft and nontender.   Central nervous system: somnolent but arousable Extremities: warm Skin: No rashes, lesions or ulcers Psychiatry: calm   Data Reviewed:  CT and lab results reviewed.  Family Communication: Daughter updated telephonically 1/17  Disposition: Status is: Inpatient Remains inpatient appropriate because: pending snf placement, auth pending  Author: Devaughn KATHEE Ban, MD 03/13/2024 1:59 PM  For on call review [email or web address].    "

## 2024-03-14 ENCOUNTER — Encounter: Payer: Self-pay | Admitting: Internal Medicine

## 2024-03-14 DIAGNOSIS — R072 Precordial pain: Secondary | ICD-10-CM | POA: Diagnosis not present

## 2024-03-14 LAB — BASIC METABOLIC PANEL WITH GFR
Anion gap: 5 (ref 5–15)
BUN: 36 mg/dL — ABNORMAL HIGH (ref 8–23)
CO2: 27 mmol/L (ref 22–32)
Calcium: 7.9 mg/dL — ABNORMAL LOW (ref 8.9–10.3)
Chloride: 101 mmol/L (ref 98–111)
Creatinine, Ser: 1.33 mg/dL — ABNORMAL HIGH (ref 0.61–1.24)
GFR, Estimated: 52 mL/min — ABNORMAL LOW
Glucose, Bld: 168 mg/dL — ABNORMAL HIGH (ref 70–99)
Potassium: 5.8 mmol/L — ABNORMAL HIGH (ref 3.5–5.1)
Sodium: 133 mmol/L — ABNORMAL LOW (ref 135–145)

## 2024-03-14 LAB — TECHNOLOGIST SMEAR REVIEW: Plt Morphology: NORMAL

## 2024-03-14 LAB — GLUCOSE, CAPILLARY: Glucose-Capillary: 324 mg/dL — ABNORMAL HIGH (ref 70–99)

## 2024-03-14 MED ORDER — DEXTROSE 50 % IV SOLN
1.0000 | Freq: Once | INTRAVENOUS | Status: AC
  Administered 2024-03-14: 50 mL via INTRAVENOUS
  Filled 2024-03-14: qty 50

## 2024-03-14 MED ORDER — INSULIN ASPART 100 UNIT/ML IV SOLN
10.0000 [IU] | Freq: Once | INTRAVENOUS | Status: AC
  Administered 2024-03-14: 10 [IU] via INTRAVENOUS
  Filled 2024-03-14: qty 10

## 2024-03-14 NOTE — Progress Notes (Signed)
 Physical Therapy Treatment Patient Details Name: Jack White MRN: [id number] DOB: 28-Jul-1940 Today's Date: 03/14/2024   History of Present Illness Pt is an 84 y/o M admitted on 03/02/24 after presenting with c/o chest pain & vomiting. Pt with positive nuclear stress test, s/p heart cath with PCI & stent 03/04/24. PMH: HTN, DM, CKD 3A, possible Parkinson's & trigeminal neuralgia, BPH, craniotomy 2/2 traumatic SDH    PT Comments  Pt in bed, ready to get up to chair.  He is able to get to EOB with rails and min a x 1.  Generally steady in sitting.  He does require mod a x 2 to stand to RW with increased guarding for body tremors especially when trying to step or do standing ex.  It is improved with static standing but heavy reliance on RW for support and continues to benefit from +2 for safety.  Stood 2 times but gait is limited to sidesteps along EOB and to chair today for general safety.  Pt remained up in chair after session with needs met.  Assisted with ordering dinner and breakfast per his request.     If plan is discharge home, recommend the following: Two people to help with walking and/or transfers;A lot of help with bathing/dressing/bathroom;Assistance with cooking/housework;Assistance with feeding;Direct supervision/assist for medications management;Direct supervision/assist for financial management;Assist for transportation;Help with stairs or ramp for entrance   Can travel by private vehicle        Equipment Recommendations  Rolling walker (2 wheels);BSC/3in1;Wheelchair (measurements PT);Wheelchair cushion (measurements PT);Hospital bed    Recommendations for Other Services       Precautions / Restrictions Precautions Precautions: Fall Recall of Precautions/Restrictions: Impaired Restrictions Weight Bearing Restrictions Per Provider Order: No Other Position/Activity Restrictions: Recent heart cath RUE     Mobility  Bed Mobility Overal bed mobility: Needs Assistance Bed  Mobility: Supine to Sit     Supine to sit: Min assist, Used rails       Patient Response: Cooperative  Transfers   Equipment used: Rolling walker (2 wheels) Transfers: Sit to/from Stand Sit to Stand: Min assist, +2 physical assistance, +2 safety/equipment, From elevated surface                Ambulation/Gait Ambulation/Gait assistance: +2 physical assistance, +2 safety/equipment, Min assist Gait Distance (Feet): 3 Feet Assistive device: Rolling walker (2 wheels) Gait Pattern/deviations: Step-to pattern Gait velocity: decreased     General Gait Details: limited to standing and transfers as +3 not available at this time   Stairs             Wheelchair Mobility     Tilt Bed Tilt Bed Patient Response: Cooperative  Modified Rankin (Stroke Patients Only)       Balance Overall balance assessment: Needs assistance Sitting-balance support: Feet supported, Bilateral upper extremity supported Sitting balance-Leahy Scale: Fair     Standing balance support: Bilateral upper extremity supported, During functional activity, Reliant on assistive device for balance Standing balance-Leahy Scale: Poor Standing balance comment: Pt remains high fall risk even with BUE support, static is better than dynamic with increased tremors with movement                            Communication Communication Communication: No apparent difficulties Factors Affecting Communication: Hearing impaired  Cognition Arousal: Alert Behavior During Therapy: WFL for tasks assessed/performed   PT - Cognitive impairments: Safety/Judgement, Awareness  PT - Cognition Comments: Pt does answer orientation questions appropriately however pt has poor overall safety awareness. Following commands: Intact Following commands impaired: Follows one step commands with increased time    Cueing Cueing Techniques: Verbal cues, Tactile cues, Visual cues   Exercises Other Exercises Other Exercises: standing AROM with walker and +2 for balance/safety    General Comments        Pertinent Vitals/Pain Pain Assessment Pain Assessment: No/denies pain Pain Intervention(s): Monitored during session, Repositioned    Home Living                          Prior Function            PT Goals (current goals can now be found in the care plan section)      Frequency    Min 2X/week      PT Plan      Co-evaluation              AM-PAC PT 6 Clicks Mobility   Outcome Measure  Help needed turning from your back to your side while in a flat bed without using bedrails?: A Lot Help needed moving from lying on your back to sitting on the side of a flat bed without using bedrails?: A Lot Help needed moving to and from a bed to a chair (including a wheelchair)?: A Lot Help needed standing up from a chair using your arms (e.g., wheelchair or bedside chair)?: A Lot Help needed to walk in hospital room?: Total Help needed climbing 3-5 steps with a railing? : Total 6 Click Score: 10    End of Session Equipment Utilized During Treatment: Gait belt Activity Tolerance: Patient tolerated treatment well Patient left: in chair;with call bell/phone within reach;with chair alarm set Nurse Communication: Mobility status PT Visit Diagnosis: Unsteadiness on feet (R26.81);Muscle weakness (generalized) (M62.81);Difficulty in walking, not elsewhere classified (R26.2);Other abnormalities of gait and mobility (R26.89)     Time: 8544-8481 PT Time Calculation (min) (ACUTE ONLY): 23 min  Charges:    $Therapeutic Activity: 23-37 mins PT General Charges $$ ACUTE PT VISIT: 1 Visit                   Lauraine Gills, PTA 03/14/24, 3:25 PM

## 2024-03-14 NOTE — Progress Notes (Signed)
" °  Progress Note   Patient: Jack White FMW:[id number] DOB: [birth year] DOA: 03/02/2024     10 DOS: the patient was seen and examined on 03/14/2024   Brief hospital course:  Jack White is a 84 y.o. 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  Patient has elevated D-dimer, CT angiogram was negative for PE. Patient also had a stress nuclear stress test, was positive for ischemia.  Patient had a heart cath performed on 1/8, placed LAD drug-eluting stent.   Principal Problem:   Precordial chest pain Active Problems:   Stage 3a chronic kidney disease (HCC)   Diabetes mellitus, type 2 (HCC)   Hypertension   Hyperkalemia   Parkinson disease (HCC)   Trigeminal neuralgia   BPH (benign prostatic hyperplasia)   History of traumatic subdural hematoma s/p craniotomy   Chest pain   Thrombocytopenia   Unstable angina (HCC)   Iron  deficiency anemia   Assessment and Plan: Unstable angina with coronary disease Minimal elevation troponin. Negative for PE, positive nuclear stress test.   Heart cath showed 95% stenosis in the LAD.  Status post drug-eluting stent, will continue dual antiplatelet treatment. Denies chest pain   Acute blood loss anemia. Iron  deficient anemia. Thrombocytopenia. Brown bm last night. Hgb stable in the 8s  Stage 3a chronic kidney disease (HCC) Hyperkalemia Hyponatremia. Stable with mild hyperkalemia will continue lokelma , bowel regimen as below. Labs pending for today  History of traumatic subdural hematoma s/p craniotomy No acute issues   BPH (benign prostatic hyperplasia) Continue tamsulosin    Parkinson disease (HCC) Trigeminal neuralgia Continue gabapentin  and carbamazepine  Resume home duloxetine    Hypertension Continue home meds   Diabetes mellitus, type 2  (HCC) Sliding scale insulin  coverage   Generalized weakness. Pt advising snf, has bed, auth pending   Constipation Resolved with lactulose . Continue miralax     Subjective:   Sitting in recliner, breathing comfortably, no complaints. Large brown bm last night  Physical Exam: Vitals:   03/14/24 0538 03/14/24 0909 03/14/24 1224 03/14/24 1658  BP: (!) 123/41 (!) 146/59 (!) 126/49 (!) 128/54  Pulse: 73 75 74 75  Resp: 18 14 18 18   Temp: 98.2 F (36.8 C) 97.7 F (36.5 C) 97.8 F (36.6 C) (!) 97.4 F (36.3 C)  TempSrc: Axillary  Oral Oral  SpO2: 96% 98% 96% 97%  Weight: 93.7 kg      General exam: Appears calm and comfortable  Respiratory system: Clear to auscultation. Respiratory effort normal. Cardiovascular system: S1 & S2 heard, RRR.   Gastrointestinal system: Abdomen is nondistended, soft and nontender.   Central nervous system: somnolent but arousable Extremities: warm Skin: No rashes, lesions or ulcers Psychiatry: calm   Data Reviewed:  CT and lab results reviewed.  Family Communication: Daughter updated telephonically 1/17  Disposition: Status is: Inpatient Remains inpatient appropriate because: pending snf placement, auth pending  Author: Devaughn KATHEE Ban, MD 03/14/2024 5:10 PM  For on call review [email or web address].    "

## 2024-03-15 ENCOUNTER — Inpatient Hospital Stay

## 2024-03-15 DIAGNOSIS — R072 Precordial pain: Secondary | ICD-10-CM | POA: Diagnosis not present

## 2024-03-15 LAB — BASIC METABOLIC PANEL WITH GFR
Anion gap: 5 (ref 5–15)
Anion gap: 6 (ref 5–15)
BUN: 35 mg/dL — ABNORMAL HIGH (ref 8–23)
BUN: 34 mg/dL — ABNORMAL HIGH (ref 8–23)
CO2: 28 mmol/L (ref 22–32)
CO2: 27 mmol/L (ref 22–32)
Calcium: 7.9 mg/dL — ABNORMAL LOW (ref 8.9–10.3)
Calcium: 8.2 mg/dL — ABNORMAL LOW (ref 8.9–10.3)
Chloride: 101 mmol/L (ref 98–111)
Chloride: 101 mmol/L (ref 98–111)
Creatinine, Ser: 1.34 mg/dL — ABNORMAL HIGH (ref 0.61–1.24)
Creatinine, Ser: 1.33 mg/dL — ABNORMAL HIGH (ref 0.61–1.24)
GFR, Estimated: 52 mL/min — ABNORMAL LOW
GFR, Estimated: 52 mL/min — ABNORMAL LOW
Glucose, Bld: 209 mg/dL — ABNORMAL HIGH (ref 70–99)
Glucose, Bld: 148 mg/dL — ABNORMAL HIGH (ref 70–99)
Potassium: 5.8 mmol/L — ABNORMAL HIGH (ref 3.5–5.1)
Potassium: 5.4 mmol/L — ABNORMAL HIGH (ref 3.5–5.1)
Sodium: 135 mmol/L (ref 135–145)
Sodium: 134 mmol/L — ABNORMAL LOW (ref 135–145)

## 2024-03-15 MED ORDER — LACTULOSE 10 GM/15ML PO SOLN
30.0000 g | Freq: Once | ORAL | Status: AC
  Administered 2024-03-15: 30 g via ORAL
  Filled 2024-03-15: qty 60

## 2024-03-15 MED ORDER — SODIUM ZIRCONIUM CYCLOSILICATE 10 G PO PACK
10.0000 g | PACK | Freq: Two times a day (BID) | ORAL | Status: DC
  Administered 2024-03-15 – 2024-03-17 (×4): 10 g via ORAL
  Filled 2024-03-15 (×6): qty 1

## 2024-03-15 NOTE — Progress Notes (Signed)
 Physical Therapy Treatment Patient Details Name: Jack White MRN: [id number] DOB: January 28, 1941 Today's Date: 03/15/2024   History of Present Illness Pt is an 84 y/o M admitted on 03/02/24 after presenting with c/o chest pain & vomiting. Pt with positive nuclear stress test, s/p heart cath with PCI & stent 03/04/24. 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  He is able to get to EOB with min a x 1, Generally steady in sitting.  Continues to need +2 min assist to stand and transfer to recliner at bedside.  Less tremors are noted today but as he turns and fatigues increased post lean in noted.  He is encouraged to remain standing but given fatigue and posture, he is assisted to sitting with min a x 2 for eccentric control.  Remains up with needs met and safety on.   If plan is discharge home, recommend the following: Two people to help with walking and/or transfers;A lot of help with bathing/dressing/bathroom;Assistance with cooking/housework;Assistance with feeding;Direct supervision/assist for medications management;Direct supervision/assist for financial management;Assist for transportation;Help with stairs or ramp for entrance   Can travel by private vehicle        Equipment Recommendations  Rolling walker (2 wheels);BSC/3in1;Wheelchair (measurements PT);Wheelchair cushion (measurements PT);Hospital bed    Recommendations for Other Services       Precautions / Restrictions Precautions Precautions: Fall Recall of Precautions/Restrictions: Impaired Restrictions Weight Bearing Restrictions Per Provider Order: No Other Position/Activity Restrictions: Recent heart cath RUE     Mobility  Bed Mobility Overal bed mobility: Needs Assistance Bed Mobility: Supine to Sit     Supine to sit: Min assist, Used rails       Patient Response:  Cooperative  Transfers   Equipment used: Rolling walker (2 wheels) Transfers: Sit to/from Stand Sit to Stand: Min assist, +2 physical assistance, +2 safety/equipment, From elevated surface                Ambulation/Gait Ambulation/Gait assistance: +2 physical assistance, +2 safety/equipment, Min assist   Assistive device: Rolling walker (2 wheels) Gait Pattern/deviations: Step-to pattern Gait velocity: decreased     General Gait Details: limited to transfer per pt choice - general fatigue from testing and tmie of day   Stairs             Wheelchair Mobility     Tilt Bed Tilt Bed Patient Response: Cooperative  Modified Rankin (Stroke Patients Only)       Balance Overall balance assessment: Needs assistance Sitting-balance support: Feet supported, Bilateral upper extremity supported Sitting balance-Leahy Scale: Fair     Standing balance support: Bilateral upper extremity supported, During functional activity, Reliant on assistive device for balance Standing balance-Leahy Scale: Poor Standing balance comment: Pt remains high fall risk even with BUE support, static is better than dynamic with increased tremors with movement                            Communication Communication Communication: No apparent difficulties Factors Affecting Communication: Hearing impaired  Cognition Arousal: Alert Behavior During Therapy: WFL for tasks assessed/performed   PT - Cognitive impairments: Safety/Judgement, Awareness                         Following commands: Intact Following commands impaired: Follows one step commands with increased  time    Cueing Cueing Techniques: Verbal cues, Tactile cues, Visual cues  Exercises      General Comments        Pertinent Vitals/Pain Pain Assessment Pain Assessment: No/denies pain Pain Intervention(s): Monitored during session, Repositioned    Home Living                           Prior Function            PT Goals (current goals can now be found in the care plan section) Progress towards PT goals: Progressing toward goals    Frequency    Min 2X/week      PT Plan      Co-evaluation              AM-PAC PT 6 Clicks Mobility   Outcome Measure  Help needed turning from your back to your side while in a flat bed without using bedrails?: A Lot Help needed moving from lying on your back to sitting on the side of a flat bed without using bedrails?: A Lot Help needed moving to and from a bed to a chair (including a wheelchair)?: A Lot Help needed standing up from a chair using your arms (e.g., wheelchair or bedside chair)?: A Lot Help needed to walk in hospital room?: Total Help needed climbing 3-5 steps with a railing? : Total 6 Click Score: 10    End of Session Equipment Utilized During Treatment: Gait belt Activity Tolerance: Patient tolerated treatment well Patient left: in chair;with call bell/phone within reach;with chair alarm set Nurse Communication: Mobility status PT Visit Diagnosis: Unsteadiness on feet (R26.81);Muscle weakness (generalized) (M62.81);Difficulty in walking, not elsewhere classified (R26.2);Other abnormalities of gait and mobility (R26.89)     Time: 8478-8467 PT Time Calculation (min) (ACUTE ONLY): 11 min  Charges:    $Therapeutic Activity: 8-22 mins PT General Charges $$ ACUTE PT VISIT: 1 Visit                   Lauraine Gills, PTA 03/15/24, 3:58 PM

## 2024-03-15 NOTE — Plan of Care (Signed)

## 2024-03-15 NOTE — Progress Notes (Signed)
 PT Cancellation Note  Patient Details Name: Jack White MRN: [id number] DOB: Jun 27, 1940   Cancelled Treatment:    Reason Eval/Treat Not Completed: Patient at procedure or test/unavailable  Pt off unit for testing.  Will return at later time/date.   Lauraine Gills 03/15/2024, 2:18 PM

## 2024-03-15 NOTE — Progress Notes (Signed)
 Occupational Therapy Treatment Patient Details Name: Jack White MRN: [id number] DOB: 1941-01-12 Today's Date: 03/15/2024   History of present illness Pt is an 84 y/o M admitted on 03/02/24 after presenting with c/o chest pain & vomiting. Pt with positive nuclear stress test, s/p heart cath with PCI & stent 03/04/24. 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 Agreeable to session with max encouragement. Continues to require +2 MIN A to STS and step pivot transfers to recliner. Increased posterior lean and small, shuffled steps noted. MOD A to doff/don gown once seated in recliner. OT will continue to follow acutely, discharge recommendation appropriate.       If plan is discharge home, recommend the following:  Two people to help with walking and/or transfers;Two people to help with bathing/dressing/bathroom;Supervision due to cognitive status   Equipment Recommendations  Other (comment)       Precautions / Restrictions Precautions Precautions: Fall Recall of Precautions/Restrictions: Impaired Restrictions Weight Bearing Restrictions Per Provider Order: No Other Position/Activity Restrictions: Recent heart cath RUE       Mobility Bed Mobility Overal bed mobility: Needs Assistance Bed Mobility: Supine to Sit     Supine to sit: Min assist, Used rails          Transfers Overall transfer level: Needs assistance Equipment used: Rolling walker (2 wheels) Transfers: Sit to/from Stand Sit to Stand: Min assist, +2 physical assistance, +2 safety/equipment, From elevated surface                 Balance Overall balance assessment: Needs assistance Sitting-balance support: Feet supported, Bilateral upper extremity supported Sitting balance-Leahy Scale: Fair     Standing balance support: Bilateral upper extremity supported, During functional activity, Reliant  on assistive device for balance Standing balance-Leahy Scale: Poor Standing balance comment: Pt remains high fall risk even with BUE support, static is better than dynamic with increased tremors with movement                           ADL either performed or assessed with clinical judgement   ADL Overall ADL's : Needs assistance/impaired                 Upper Body Dressing : Sitting;Moderate assistance Upper Body Dressing Details (indicate cue type and reason): doff/donn gown                 Functional mobility during ADLs: +2 for physical assistance;Moderate assistance;Rolling walker (2 wheels) General ADL Comments: pt initally declining due to fatigue; agreeable to transfer to recliner with encourgement. pt declined ADL performance for bathing but agreed to change soiled gown     Communication Communication Communication: No apparent difficulties Factors Affecting Communication: Hearing impaired   Cognition Arousal: Alert Behavior During Therapy: WFL for tasks assessed/performed Cognition: Cognition impaired         Attention impairment (select first level of impairment): Sustained attention Executive functioning impairment (select all impairments): Reasoning, Problem solving, Sequencing                   Following commands: Intact Following commands impaired: Follows one step commands with increased time      Cueing   Cueing Techniques: Verbal cues, Tactile cues, Visual cues             Pertinent Vitals/ Pain  Pain Assessment Pain Assessment: No/denies pain   Frequency  Min 2X/week        Progress Toward Goals  OT Goals(current goals can now be found in the care plan section)  Progress towards OT goals: Progressing toward goals      AM-PAC OT 6 Clicks Daily Activity     Outcome Measure   Help from another person eating meals?: A Little Help from another person taking care of personal grooming?: A Little Help  from another person toileting, which includes using toliet, bedpan, or urinal?: A Lot Help from another person bathing (including washing, rinsing, drying)?: A Lot Help from another person to put on and taking off regular upper body clothing?: A Little Help from another person to put on and taking off regular lower body clothing?: A Lot 6 Click Score: 15    End of Session Equipment Utilized During Treatment: Gait belt;Rolling walker (2 wheels)  OT Visit Diagnosis: Other abnormalities of gait and mobility (R26.89);Muscle weakness (generalized) (M62.81)   Activity Tolerance Patient tolerated treatment well   Patient Left in chair;with call bell/phone within reach;with chair alarm set   Nurse Communication Mobility status        Time: 8478-8467 OT Time Calculation (min): 11 min  Charges: OT General Charges $OT Visit: 1 Visit  Ada Woodbury L. Aradia Estey, OTR/L  03/15/24, 4:32 PM

## 2024-03-15 NOTE — Progress Notes (Signed)
 OT Cancellation Note  Patient Details Name: Jack White MRN: [id number] DOB: [birth year]   Cancelled Treatment:    Reason Eval/Treat Not Completed: Patient at procedure or test/ unavailable Pt currently off the unit for ultrasound. OT will follow and see as able.  Courtenay Hirth L. Shequilla Goodgame, OTR/L  03/15/24, 2:04 PM

## 2024-03-15 NOTE — TOC Progression Note (Signed)
 Transition of Care [facility]) - Progression Note    Patient Details  Name: TOMMEY BARRET MRN: [id number] Date of Birth: 08-07-1940  Transition of Care [facility]) CM/SW Contact  Shasta DELENA Daring, RN Phone Number: 03/15/2024, 1:17 PM  Clinical Narrative:     RNCM called [employer] to follow up on authorizatoin.   Approval for SNF: [id number] Approval for transport:  [id number]  Notified MD  Expected Discharge Plan: Home/Self Care Barriers to Discharge: Continued Medical Work up               Expected Discharge Plan and Services     Post Acute Care Choice: NA Living arrangements for the past 2 months: Single Family Home                                       Social Drivers of Health (SDOH) Interventions SDOH Screenings   Food Insecurity: No Food Insecurity (03/03/2024)  Housing: Low Risk (03/03/2024)  Transportation Needs: No Transportation Needs (03/03/2024)  Utilities: Not At Risk (03/03/2024)  Financial Resource Strain: High Risk (10/07/2023)   Received from [facility]  Social Connections: Moderately Integrated (03/03/2024)  Tobacco Use: Low Risk (03/14/2024)    Readmission Risk Interventions    03/05/2024   12:58 PM  Readmission Risk Prevention Plan  Transportation Screening Complete  PCP or Specialist Appt within 3-5 Days Complete  Social Work Consult for Recovery Care Planning/Counseling Complete  Palliative Care Screening Not Applicable  Medication Review [employer]) Complete

## 2024-03-15 NOTE — Consult Note (Signed)
 " [facility]  CONSULT NOTE    Date: 03/15/2024                  Patient Name:  Jack White  MRN: [id number]  DOB: 1940/11/04  Age / Sex: 84 y.o., male         PCP: Diedra Lame, MD                 Service Requesting Consult: TRH                 Reason for Consult: Acute kidney injury            History of Present Illness: Jack White is a 84 y.o.  male with past medical history of hypertension, BPH, diabetes, traumatic subdural s/p craniotomy, who was admitted to [facility] on 03/02/2024 for Troponin level elevated [R79.89] Chest pain, moderate coronary artery risk [R07.9] Chest pain [R07.9]  Patient initially presented to emergency department with shortness of breath.  We have been consulted due to persistent elevated potassium levels.  Patient states he eats what is brought to him from dietary.  States he has been eating a lot of sweet potatoes lately.  Does have history of urinary retention, BPH.  Remains on tamsulosin .  Patient has had persistent elevated potassium levels since 1/11, 5.3.  Potassium has peaked at 5.8.  Currently prescribed Lokelma  10 g daily.   Medications: Outpatient medications: Medications Prior to Admission  Medication Sig Dispense Refill Last Dose/Taking   acetaminophen  (TYLENOL ) 500 MG tablet Take 500-1,000 mg by mouth 2 (two) times daily.   Unknown   atorvastatin  (LIPITOR) 10 MG tablet Take 1 tablet (10 mg total) by mouth at bedtime.   03/01/2024 Bedtime   butalbital -aspirin -caffeine  (FIORINAL) 50-325-40 MG capsule TAKE 1 CAPSULE BY MOUTH EVERY 6 HOURS AS NEEDED FOR HEADACHE   Unknown   carbamazepine  (TEGRETOL ) 200 MG tablet Take 1 tablet (200 mg total) by mouth 3 (three) times daily.   03/02/2024 Morning   diphenhydrAMINE  (BENADRYL ) 25 MG tablet Take 2 tablets (50 mg total) by mouth at bedtime.   03/01/2024 Bedtime   doxazosin  (CARDURA ) 4 MG tablet Take 4 mg by mouth daily.   03/02/2024 Morning   DULoxetine  (CYMBALTA ) 60 MG  capsule Take 60 mg by mouth daily.   03/02/2024 Morning   ergocalciferol  (VITAMIN D2) 1.25 MG (50000 UT) capsule Take 50,000 Units by mouth once a week.   Past Week   ferrous sulfate  325 (65 FE) MG tablet Take 1 tablet (325 mg total) by mouth daily with breakfast.   03/02/2024 Morning   fluticasone  (FLONASE ) 50 MCG/ACT nasal spray Place 2 sprays into both nostrils daily as needed for allergies.   Unknown   folic acid  (FOLVITE ) 1 MG tablet Take 1 tablet (1 mg total) by mouth daily.   03/02/2024 Morning   gabapentin  (NEURONTIN ) 300 MG capsule Take 300 mg by mouth at bedtime.   03/01/2024 Bedtime   guaiFENesin -dextromethorphan (ROBITUSSIN DM) 100-10 MG/5ML syrup Take 5 mLs by mouth every 4 (four) hours as needed for cough. 118 mL 0 Unknown   hydrocortisone  cream 1 % Apply 1 Application topically 3 (three) times daily as needed for itching.   Unknown   hydroxypropyl methylcellulose / hypromellose (ISOPTO TEARS / GONIOVISC) 2.5 % ophthalmic solution Place 2 drops into both eyes 4 (four) times daily as needed for dry eyes.   Unknown   ipratropium-albuterol  (DUONEB) 0.5-2.5 (3) MG/3ML SOLN Take 3 mLs by nebulization every  6 (six) hours as needed (wheezing).   Unknown   melatonin 10 MG TABS Take 10 mg by mouth at bedtime.   03/01/2024 Bedtime   metFORMIN  (GLUCOPHAGE -XR) 500 MG 24 hr tablet Take 500 mg by mouth daily.   03/02/2024 Morning   modafinil  (PROVIGIL ) 100 MG tablet Take 1 tablet (100 mg total) by mouth daily. 10 tablet 0 03/02/2024 Morning   montelukast  (SINGULAIR ) 10 MG tablet Take 10 mg by mouth at bedtime.   03/01/2024 Bedtime   omeprazole (PRILOSEC) 20 MG capsule Take 20 mg by mouth daily.   03/02/2024 Morning   polyethylene glycol (MIRALAX  / GLYCOLAX ) 17 g packet Take 17 g by mouth daily as needed.   Unknown   propranolol  (INDERAL ) 20 MG tablet Take 20 mg by mouth 2 (two) times daily.   03/02/2024 Morning   sennosides (SENOKOT) 8.8 MG/5ML syrup Take 5 mLs by mouth at bedtime.   03/01/2024 Bedtime   [EXPIRED]  tamsulosin  (FLOMAX ) 0.4 MG CAPS capsule Take 0.4 mg by mouth daily after breakfast.   03/02/2024 Morning    Current medications: Current Facility-Administered Medications  Medication Dose Route Frequency Provider Last Rate Last Admin   0.9 %  sodium chloride  infusion (Manually program via Guardrails IV Fluids)   Intravenous Once Laurita Pillion, MD       acetaminophen  (TYLENOL ) tablet 650 mg  650 mg Oral Q4H PRN Duncan, Hazel V, MD   650 mg at 03/14/24 1011   aspirin  chewable tablet 81 mg  81 mg Oral Daily Callwood, Dwayne D, MD   81 mg at 03/15/24 9161   atorvastatin  (LIPITOR) tablet 40 mg  40 mg Oral QHS Hudson, Caralyn, PA-C   40 mg at 03/14/24 2201   carbamazepine  (TEGRETOL ) tablet 200 mg  200 mg Oral TID Duncan, Hazel V, MD   200 mg at 03/15/24 1605   clopidogrel  (PLAVIX ) tablet 75 mg  75 mg Oral Q breakfast Callwood, Dwayne D, MD   75 mg at 03/15/24 9161   diphenhydrAMINE  (BENADRYL ) capsule 25 mg  25 mg Oral QHS Zhang, Dekui, MD   25 mg at 03/14/24 2201   DULoxetine  (CYMBALTA ) DR capsule 60 mg  60 mg Oral Daily Kandis Devaughn Sayres, MD   60 mg at 03/15/24 9161   gabapentin  (NEURONTIN ) capsule 300 mg  300 mg Oral QHS Duncan, Hazel V, MD   300 mg at 03/14/24 2201   ipratropium-albuterol  (DUONEB) 0.5-2.5 (3) MG/3ML nebulizer solution 3 mL  3 mL Nebulization Q6H PRN Cleatus Delayne GAILS, MD   3 mL at 03/09/24 1818   iron  polysaccharides (NIFEREX) capsule 150 mg  150 mg Oral Daily Zhang, Dekui, MD   150 mg at 03/15/24 9162   melatonin tablet 5 mg  5 mg Oral QHS Zhang, Dekui, MD   5 mg at 03/14/24 2201   modafinil  (PROVIGIL ) tablet 100 mg  100 mg Oral Daily Duncan, Hazel V, MD   100 mg at 03/15/24 9161   nitroGLYCERIN  (NITROSTAT ) SL tablet 0.4 mg  0.4 mg Sublingual Q5 Min x 3 PRN Cleatus Delayne GAILS, MD       ondansetron  (ZOFRAN ) injection 4 mg  4 mg Intravenous Q6H PRN Duncan, Hazel V, MD   4 mg at 03/05/24 2222   polyethylene glycol (MIRALAX  / GLYCOLAX ) packet 34 g  34 g Oral Daily Kandis Devaughn Sayres, MD   34  g at 03/15/24 9161   sodium chloride  flush (NS) 0.9 % injection 3 mL  3 mL Intravenous PRN Callwood, Dwayne D,  MD       sodium zirconium cyclosilicate  (LOKELMA ) packet 10 g  10 g Oral Daily Kandis Devaughn Sayres, MD   10 g at 03/15/24 9161   tamsulosin  (FLOMAX ) capsule 0.4 mg  0.4 mg Oral QPC breakfast Cleatus Delayne GAILS, MD   0.4 mg at 03/15/24 9161      Allergies: Allergies[1]    Past Medical History: Past Medical History:  Diagnosis Date   Acute cholecystitis 05/21/2014   Arthritis    BP (high blood pressure) 10/14/2014   Cancer (HCC)    skin   Chronic kidney disease (CKD), stage III (moderate) (HCC) 02/15/2014   Colon perforation (HCC) 05/28/2014   Diabetes mellitus without complication (HCC)    [name]'s neuralgia 10/14/2014   Headache    Hypertension    Intractable nausea and vomiting 04/07/2023   MI, old    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 Lucien   COLOSTOMY TAKEDOWN N/A 10/26/2014   Procedure: COLOSTOMY TAKEDOWN;  Surgeon: Elgin Laurence III, MD;  Location: [facility] ORS;  Service: General;  Laterality: N/A;   CORONARY STENT INTERVENTION N/A 03/04/2024   Procedure: CORONARY STENT INTERVENTION;  Surgeon: Florencio Cara BIRCH, MD;  Location: [facility] INVASIVE CV LAB;  Service: Cardiovascular;  Laterality: N/A;   CRANIOTOMY Right 07/21/2023   Procedure: CRANIOTOMY HEMATOMA EVACUATION SUBDURAL;  Surgeon: Lanis Pupa, MD;  Location: [facility] OR;  Service: Neurosurgery;  Laterality: Right;   EXPLORATORY LAPAROTOMY  05/28/2014   Dr. Lucien   LAPAROSCOPIC CHOLECYSTECTOMY  05/21/2014   Dr. Laurence   LEFT HEART CATH AND CORONARY ANGIOGRAPHY N/A 03/04/2024   Procedure: LEFT HEART CATH AND CORONARY ANGIOGRAPHY;  Surgeon: Florencio Cara BIRCH, MD;  Location: [facility] INVASIVE CV LAB;  Service: Cardiovascular;  Laterality: N/A;     Family History: Family History   Problem Relation Age of Onset   Diabetes Mother    Hypertension Mother    Alzheimer's disease Mother    Heart disease Father    Diabetes Sister      Social History: Social History   Socioeconomic History   Marital status: Single    Spouse name: Not on file   Number of children: Not on file   Years of education: Not on file   Highest education level: Not on file  Occupational History   Occupation: retired    Comment: financial planner - doctor, 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   Occupation: retired    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[employer]; 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  Tobacco Use   Smoking status: Never   Smokeless tobacco: Never  Vaping Use   Vaping status: Never Used  Substance and Sexual Activity   Alcohol  use: No    Alcohol /week: 0.0 standard drinks of alcohol    Drug use: No   Sexual activity: Not on file  Other Topics Concern   Not on file  Social History Narrative   Not on file   Social Drivers of Health   Tobacco Use: Low Risk (03/14/2024)   Patient History    Smoking Tobacco Use: Never    Smokeless Tobacco Use: Never    Passive Exposure: Not on file  Financial Resource Strain: High Risk (10/07/2023)   Received from [facility]   Overall Financial Resource Strain (CARDIA)    How hard is it for you to pay for the very basics like food,  housing, medical care, and heating?: Very hard  Food Insecurity: No Food Insecurity (03/03/2024)   Epic    Worried About Programme Researcher, Broadcasting/film/video in the Last Year: Never true    Ran Out of Food in the Last Year: Never true  Transportation Needs: No Transportation Needs (03/03/2024)   Epic    Lack of Transportation (Medical): No    Lack of Transportation (Non-Medical): No  Physical Activity: Not on file  Stress: Not on file  Social Connections: Moderately Integrated (03/03/2024)   Social Connection and Isolation Panel    Frequency of Communication with Friends and Family: Three times a week    Frequency of Social Gatherings  with Friends and Family: Three times a week    Attends Religious Services: More than 4 times per year    Active Member of Clubs or Organizations: Yes    Attends Banker Meetings: 1 to 4 times per year    Marital Status: Widowed  Intimate Partner Violence: Not At Risk (03/03/2024)   Epic    Fear of Current or Ex-Partner: No    Emotionally Abused: No    Physically Abused: No    Sexually Abused: No  Depression (PHQ2-9): Not on file  Alcohol  Screen: Not on file  Housing: Low Risk (03/03/2024)   Epic    Unable to Pay for Housing in the Last Year: No    Number of Times Moved in the Last Year: 0    Homeless in the Last Year: No  Utilities: Not At Risk (03/03/2024)   Epic    Threatened with loss of utilities: No  Health Literacy: Not on file      Vital Signs: Blood pressure (!) 106/53, pulse 90, temperature 98 F (36.7 C), temperature source Oral, resp. rate 18, weight 93.7 kg, SpO2 95%.  Weight trends: Filed Weights   03/03/24 0600 03/10/24 0401 03/14/24 0538  Weight: 93.1 kg 95 kg 93.7 kg    Physical Exam: General: NAD, resting comfortably  Head: Normocephalic, atraumatic. Moist oral mucosal membranes  Eyes: Anicteric  Lungs:  Clear to auscultation, normal effort  Heart: Regular rate and rhythm  Abdomen:  Soft, nontender,   Extremities: No peripheral edema.  Neurologic: Alert and oriented  Skin: No lesions  Access: None     Lab results: Basic Metabolic Panel: Recent Labs  Lab 03/09/24 0502 03/09/24 1401 03/14/24 1741 03/15/24 0020 03/15/24 0427  NA 136   < > 133* 135 134*  K 5.5*   < > 5.8* 5.8* 5.4*  CL 105   < > 101 101 101  CO2 26   < > 27 28 27   GLUCOSE 174*   < > 168* 209* 148*  BUN 51*   < > 36* 35* 34*  CREATININE 1.66*   < > 1.33* 1.34* 1.33*  CALCIUM  7.7*   < > 7.9* 7.9* 8.2*  MG 2.8*  --   --   --   --    < > = values in this interval not displayed.    Liver Function Tests: Recent Labs  Lab 03/13/24 0903  AST 28  ALT 33  ALKPHOS  126  BILITOT 0.3  PROT 6.0*  ALBUMIN  2.9*   No results for input(s): LIPASE, AMYLASE in the last 168 hours. No results for input(s): AMMONIA in the last 168 hours.  CBC: Recent Labs  Lab 03/10/24 0411 03/11/24 0448 03/12/24 0445 03/13/24 0353 03/13/24 0903  WBC 5.1 5.6 5.7 6.6 6.5  NEUTROABS  --   --   --   --  3.9  HGB 8.3* 8.2* 7.8* 8.0* 8.2*  HCT 24.5* 25.2* 24.0* 24.1* 25.2*  MCV 95.3 98.8 99.2 98.4 99.2  PLT 170 176 188 201 192    Cardiac Enzymes: No results for input(s): CKTOTAL, CKMB, CKMBINDEX, TROPONINI in the last 168 hours.  BNP: Invalid input(s): POCBNP  CBG: Recent Labs  Lab 03/11/24 1255 03/14/24 2032  GLUCAP 232* 324*    Microbiology: Results for orders placed or performed during the hospital encounter of 07/21/23  MRSA Next Gen by PCR, Nasal     Status: None   Collection Time: 07/22/23  2:25 AM   Specimen: Nasal Mucosa; Nasal Swab  Result Value Ref Range Status   MRSA by PCR Next Gen NOT DETECTED NOT DETECTED Final    Comment: (NOTE) The GeneXpert MRSA Assay (FDA approved for NASAL specimens only), is one component of a comprehensive MRSA colonization surveillance program. It is not intended to diagnose MRSA infection nor to guide or monitor treatment for MRSA infections. Test performance is not FDA approved in patients less than 55 years old. Performed at [facility], [address], [place], [place] [address]   Culture, blood (Routine X 2) w Reflex to ID Panel     Status: None   Collection Time: 07/27/23  6:52 PM   Specimen: BLOOD  Result Value Ref Range Status   Specimen Description BLOOD BLOOD LEFT ARM  Final   Special Requests   Final    AEROBIC BOTTLE ONLY Blood Culture results may not be optimal due to an inadequate volume of blood received in culture bottles   Culture   Final    NO GROWTH 5 DAYS Performed at [facility], [address] [place], [place] [address]    Report Status 08/01/2023 FINAL   Final  Culture, blood (Routine X 2) w Reflex to ID Panel     Status: None   Collection Time: 07/27/23  6:54 PM   Specimen: BLOOD  Result Value Ref Range Status   Specimen Description BLOOD BLOOD LEFT HAND  Final   Special Requests   Final    AEROBIC BOTTLE ONLY Blood Culture results may not be optimal due to an inadequate volume of blood received in culture bottles   Culture   Final    NO GROWTH 5 DAYS Performed at [facility], [address], [place], [place] [address]    Report Status 08/01/2023 FINAL  Final    Coagulation Studies: No results for input(s): LABPROT, INR in the last 72 hours.  Urinalysis: No results for input(s): COLORURINE, LABSPEC, PHURINE, GLUCOSEU, HGBUR, BILIRUBINUR, KETONESUR, PROTEINUR, UROBILINOGEN, NITRITE, LEUKOCYTESUR in the last 72 hours.  Invalid input(s): APPERANCEUR    Imaging: No results found.   Assessment & Plan: Jack White is a 84 y.o.  male with  past medical history of hypertension, BPH, diabetes, traumatic subdural s/p craniotomy, and chronic kidney disease stage IIIa, who was admitted to [facility] on 03/02/2024 for Troponin level elevated [R79.89] Chest pain, moderate coronary artery risk [R07.9] Chest pain [R07.9]  Hyperkalemia, potassium 5.3-5.8.  Secondary to diet versus retention.  Will obtain renal ultrasound to evaluate obstruction.  Will increase Lokelma  to twice daily.  2.  Hypertension, essential.  Home regimen includes doxazosin  and propranolol , both currently held.  3.  Chronic kidney disease stage IIIa, baseline creatinine 1.35 with GFR 52 back in August 2025.  Creatinine has peaked to 1.88 during this admission however has returned to baseline.   LOS:  11 Jack White 1/19/20264:16 PM     [1]  Allergies Allergen Reactions   Cantaloupe (Diagnostic)    Dilaudid [Hydromorphone Hcl] Other (See Comments)    Respiratory arrest   "

## 2024-03-15 NOTE — Plan of Care (Signed)
" °  Problem: Activity: Goal: Ability to tolerate increased activity will improve Outcome: Progressing   Problem: Cardiac: Goal: Ability to achieve and maintain adequate cardiovascular perfusion will improve Outcome: Progressing   Problem: Health Behavior/Discharge Planning: Goal: Ability to safely manage health-related needs after discharge will improve Outcome: Progressing   Problem: Education: Goal: Understanding of cardiac disease, CV risk reduction, and recovery process will improve Outcome: Progressing Goal: Individualized Educational Video(s) Outcome: Progressing   Problem: Clinical Measurements: Goal: Diagnostic test results will improve Outcome: Progressing   Problem: Clinical Measurements: Goal: Ability to maintain clinical measurements within normal limits will improve Outcome: Progressing Goal: Will remain free from infection Outcome: Progressing Goal: Diagnostic test results will improve Outcome: Progressing Goal: Respiratory complications will improve Outcome: Progressing Goal: Cardiovascular complication will be avoided Outcome: Progressing   "

## 2024-03-15 NOTE — Progress Notes (Signed)
" °  Progress Note   Patient: Jack White FMW:[id number] DOB: Sep 05, 1940 DOA: 03/02/2024     11 DOS: the patient was seen and examined on 03/15/2024   Brief hospital course:  JAMONE GARRIDO is a 84 y.o. 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  Patient has elevated D-dimer, CT angiogram was negative for PE. Patient also had a stress nuclear stress test, was positive for ischemia.  Patient had a heart cath performed on 1/8, placed LAD drug-eluting stent.   Principal Problem:   Precordial chest pain Active Problems:   Stage 3a chronic kidney disease (HCC)   Diabetes mellitus, type 2 (HCC)   Hypertension   Hyperkalemia   Parkinson disease (HCC)   Trigeminal neuralgia   BPH (benign prostatic hyperplasia)   History of traumatic subdural hematoma s/p craniotomy   Chest pain   Thrombocytopenia   Unstable angina (HCC)   Iron  deficiency anemia   Assessment and Plan: Unstable angina with coronary disease Minimal elevation troponin. Negative for PE, positive nuclear stress test.   Heart cath showed 95% stenosis in the LAD.  Status post drug-eluting stent, will continue dual antiplatelet treatment. Denies chest pain   Acute blood loss anemia. Iron  deficient anemia. Thrombocytopenia. Brown bm last night. Hgb stable in the 8s  Stage 3a chronic kidney disease (HCC) Hyperkalemia Hyponatremia. Persistent hyperkalemia. Despite lokelma . Will involve nephrology today. They have advised holding lovenox , will do so  History of traumatic subdural hematoma s/p craniotomy No acute issues   BPH (benign prostatic hyperplasia) Continue tamsulosin    Parkinson disease (HCC) Trigeminal neuralgia Continue gabapentin  and carbamazepine  Resume home duloxetine    Hypertension Continue home meds   Diabetes  mellitus, type 2 (HCC) Sliding scale insulin  coverage   Generalized weakness. Pt advising snf, has bed, auth pending   Constipation Resolved with lactulose . Continue miralax     Subjective:   Sitting in recliner, breathing comfortably, no complaints.   Physical Exam: Vitals:   03/15/24 0133 03/15/24 0411 03/15/24 0838 03/15/24 1322  BP: (!) 121/53 (!) 143/53 (!) 154/66 (!) 106/53  Pulse: 73 79 78 90  Resp: 16 18 18 18   Temp: 98.4 F (36.9 C) 97.8 F (36.6 C) 98.3 F (36.8 C) 98 F (36.7 C)  TempSrc:   Oral Oral  SpO2: 96% 96% 97% 95%  Weight:       General exam: Appears calm and comfortable  Respiratory system: Clear to auscultation. Respiratory effort normal. Cardiovascular system: S1 & S2 heard, RRR.   Gastrointestinal system: Abdomen is nondistended, soft and nontender.   Central nervous system: somnolent but arousable Extremities: warm Skin: No rashes, lesions or ulcers Psychiatry: calm   Data Reviewed:  CT and lab results reviewed.  Family Communication: Daughter updated telephonically 1/19  Disposition: Status is: Inpatient Remains inpatient appropriate because: persistent hyperkalemia  Author: Devaughn KATHEE Ban, MD 03/15/2024 2:50 PM  For on call review [email or web address].    "

## 2024-03-15 NOTE — Inpatient Diabetes Management (Signed)
 Inpatient Diabetes Program Recommendations  AACE/ADA: New Consensus Statement on Inpatient Glycemic Control   Target Ranges:  Prepandial:   less than 140 mg/dL      Peak postprandial:   less than 180 mg/dL (1-2 hours)      Critically ill patients:  140 - 180 mg/dL    Latest Reference Range & Units 03/02/24 18:05 03/04/24 14:00 03/08/24 09:34 03/11/24 12:55 03/14/24 20:32  Glucose-Capillary 70 - 99 mg/dL 843 (H) 839 (H) 838 (H) 232 (H) 324 (H)   Review of Glycemic Control  Diabetes history: DM2 Outpatient Diabetes medications: Metformin  XR 500 mg daily Current orders for Inpatient glycemic control: None  Inpatient Diabetes Program Recommendations:    Insulin : CBG 324 mg/dl at 79:67 on 8/81/73.  Please consider ordering Novolog  0-9 units TID with meals.  Diet: If appropriate for patient, may want to consider discontinuing Regular diet and ordering Carb Modified diet.  Thanks, Earnie Gainer, RN, MSN, CDCES Diabetes Coordinator Inpatient Diabetes Program [phone] (Team Pager from 8am to 5pm)

## 2024-03-16 DIAGNOSIS — E440 Moderate protein-calorie malnutrition: Secondary | ICD-10-CM | POA: Insufficient documentation

## 2024-03-16 DIAGNOSIS — R072 Precordial pain: Secondary | ICD-10-CM | POA: Diagnosis not present

## 2024-03-16 LAB — CBC
HCT: 22.1 % — ABNORMAL LOW (ref 39.0–52.0)
Hemoglobin: 7 g/dL — ABNORMAL LOW (ref 13.0–17.0)
MCH: 32.6 pg (ref 26.0–34.0)
MCHC: 31.7 g/dL (ref 30.0–36.0)
MCV: 102.8 fL — ABNORMAL HIGH (ref 80.0–100.0)
Platelets: 188 K/uL (ref 150–400)
RBC: 2.15 MIL/uL — ABNORMAL LOW (ref 4.22–5.81)
RDW: 14.3 % (ref 11.5–15.5)
WBC: 5.2 K/uL (ref 4.0–10.5)
nRBC: 0 % (ref 0.0–0.2)

## 2024-03-16 LAB — BASIC METABOLIC PANEL WITH GFR
Anion gap: 6 (ref 5–15)
BUN: 32 mg/dL — ABNORMAL HIGH (ref 8–23)
CO2: 28 mmol/L (ref 22–32)
Calcium: 7.7 mg/dL — ABNORMAL LOW (ref 8.9–10.3)
Chloride: 100 mmol/L (ref 98–111)
Creatinine, Ser: 1.33 mg/dL — ABNORMAL HIGH (ref 0.61–1.24)
GFR, Estimated: 52 mL/min — ABNORMAL LOW
Glucose, Bld: 188 mg/dL — ABNORMAL HIGH (ref 70–99)
Potassium: 5.1 mmol/L (ref 3.5–5.1)
Sodium: 134 mmol/L — ABNORMAL LOW (ref 135–145)

## 2024-03-16 LAB — C-REACTIVE PROTEIN: CRP: 1.3 mg/dL — ABNORMAL HIGH

## 2024-03-16 LAB — PREPARE RBC (CROSSMATCH)

## 2024-03-16 MED ORDER — ADULT MULTIVITAMIN W/MINERALS CH
1.0000 | ORAL_TABLET | Freq: Every day | ORAL | Status: DC
  Administered 2024-03-16 – 2024-03-17 (×2): 1 via ORAL
  Filled 2024-03-16 (×2): qty 1

## 2024-03-16 MED ORDER — SODIUM CHLORIDE 0.9% IV SOLUTION: Freq: Once | INTRAVENOUS | Status: AC

## 2024-03-16 MED ORDER — VITAMIN C 500 MG PO TABS
500.0000 mg | ORAL_TABLET | Freq: Two times a day (BID) | ORAL | Status: DC
  Administered 2024-03-16 – 2024-03-17 (×3): 500 mg via ORAL
  Filled 2024-03-16 (×3): qty 1

## 2024-03-16 MED ORDER — ZINC SULFATE 220 (50 ZN) MG PO CAPS
220.0000 mg | ORAL_CAPSULE | Freq: Every day | ORAL | Status: DC
  Administered 2024-03-16 – 2024-03-17 (×2): 220 mg via ORAL
  Filled 2024-03-16 (×2): qty 1

## 2024-03-16 MED ORDER — JUVEN PO PACK
1.0000 | PACK | Freq: Two times a day (BID) | ORAL | Status: DC
  Administered 2024-03-16 – 2024-03-17 (×4): 1 via ORAL

## 2024-03-16 MED ORDER — METFORMIN HCL ER 500 MG PO TB24
500.0000 mg | ORAL_TABLET | Freq: Every day | ORAL | Status: DC
  Administered 2024-03-16 – 2024-03-17 (×2): 500 mg via ORAL
  Filled 2024-03-16 (×3): qty 1

## 2024-03-16 MED ORDER — LACTULOSE 10 GM/15ML PO SOLN
30.0000 g | Freq: Once | ORAL | Status: AC
  Administered 2024-03-16: 30 g via ORAL
  Filled 2024-03-16: qty 60

## 2024-03-16 NOTE — Progress Notes (Addendum)
 Initial Nutrition Assessment  DOCUMENTATION CODES:   Non-severe (moderate) malnutrition in context of chronic illness  INTERVENTION:   -Carb modified diet -RD placed 2000 mg potassium limit daily on diet -MVI with minerals daily -500 mg vitamin C  BID -220 mg zinc  sulfate daily x 14 days -1 packet Juven BID, each packet provides 95 calories, 2.5 grams of protein (collagen), and 9.8 grams of carbohydrate (3 grams sugar); also contains 7 grams of L-arginine and L-glutamine, 300 mg vitamin C , 15 mg vitamin E, 1.2 mcg vitamin B-12, 9.5 mg zinc , 200 mg calcium , and 1.5 g  Calcium  Beta-hydroxy-Beta-methylbutyrate to support wound healing  -Double protein portions with meals -RD will assess for potential micronutrient deficiencies which may impede wound healing: vitamin A and CRP (to best interpret lab values)  -RD reached consulted and discussed with DM coordinator regarding hyperglycemia and insulin  needs  -Food preferences obtained and added to Health[employer] meal ordering system   NUTRITION DIAGNOSIS:   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  GOAL:   Patient will meet greater than or equal to 90% of their needs  MONITOR:   PO intake, Supplement acceptance  REASON FOR ASSESSMENT:   Consult Assessment of nutrition requirement/status, Diet education  ASSESSMENT:   84 y.o. 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  Patient admitted with chest pain.   1/8- Patient also had a stress nuclear stress test, was positive for ischemia.  1/9- s/p cardiac cath Normal left ventricular function EF of at least 55%, Successful PCI and stent DES to mid LAD   Reviewed I/O's: +480 ml x 24  hours and -13 L since admission  Per CWOCN notes, patient with scrotal skin tear (partial thickness posterior scrotum red moist) and stage 2 pressure injury to sacrum (extending down into gluteal fold).    Per nephrology notes, patient with ongoing hyperkalemia despite use of potassium binder; renal function is stable. Plan for renal ultrasound to rule out obstruction and increasing lokelma  dose (to twice per day). Nephrology also concerned about nutritional intake, stating patient has been eating a lot of sweet potatoes. RD has been consulted for low potassium diet.   Spoke with patient, who was sitting in recliner chair at time of visit. He reports he fell okay. Patient reports he has a good appetite when I'm able to get the food I want. Per meal completion records, noted meal completions 60-100%. Patient shares that he has been eating most of his meals during this hospitalization. Breakfast tray was sitting in front of patient, untouched, which consisted of prune juice, peaches, milk, coffee, and an omelette. Patient refused omelette and stated that he does not eat eggs, as I ate too many powdered eggs in the service. RD ordered alternate meal tray for him and also placed lunch order per his request.   PTA patient has a good appetite, consuming 3 meals per day (Breakfast: cereal, Lunch: sandwich, Dinner: meat and vegetable).    Reviewed weight history. Weight has ranged from 89.8-93.7 over the past 6 months. Patient reports intentional weight loss over the past several years. He explained that he used to weight over 400# and enrolled in the RICE diet program at [facility] to assist with weight management; he reviewed the resources from this program and states he has  limited his intake to 1500 kcals daily to steadily lose down to 190# (I really want to get down to 185#).   RD discussed concern regarding K levels. He remembers meeting with nephrologist. RD attempted to discuss care plan with patient  and concern for potassium levels, however, patient continued to change the subject whenever this was brought up.   Discussed importance of good meal and supplement intake to promote healing. RD also discussed concern about weight loss, muscle wasting, and increased nutritional needs due to wound healing. Patient reports I don't think you'll be able to fix that here.   Medications reviewed and include neurontin , nifrex, miralax , and lokelma .   Lab Results  Component Value Date   HGBA1C 6.5 (H) 07/22/2023   PTA DM medications are 500 mg metformin  BID.   Labs reviewed: Na: 134, CBGS: 324 (inpatient orders for glycemic control are none).    NUTRITION - FOCUSED PHYSICAL EXAM:  Flowsheet Row Most Recent Value  Orbital Region No depletion  Upper Arm Region Mild depletion  Thoracic and Lumbar Region No depletion  Buccal Region Mild depletion  Temple Region Mild depletion  Clavicle Bone Region Moderate depletion  Clavicle and Acromion Bone Region Moderate depletion  Scapular Bone Region Moderate depletion  Dorsal Hand Mild depletion  Patellar Region No depletion  Anterior Thigh Region No depletion  Posterior Calf Region No depletion  Edema (RD Assessment) Mild  Hair Reviewed  Eyes Reviewed  Mouth Reviewed  Skin Reviewed  Nails Reviewed    Diet Order:   Diet Order             Diet regular Room service appropriate? Yes; Fluid consistency: Thin  Diet effective now                   EDUCATION NEEDS:   Education needs have been addressed  Skin:  Skin Assessment: Skin Integrity Issues: Skin Integrity Issues:: Other (Comment), Stage II Stage II: sacrum (extending down into gluteal fold) Other: scrotal skin tear (partial thickness posterior scrotum red moist)  Last BM:  03/13/24  Height:   Ht Readings from Last 1 Encounters:  12/09/23 6' 3 (1.905 m)    Weight:   Wt Readings from Last 1 Encounters:  03/14/24 93.7 kg    Ideal Body Weight:  89.1 kg  BMI:  Body  mass index is 25.82 kg/m.  Estimated Nutritional Needs:   Kcal:  2200-2400  Protein:  115-130 grams  Fluid:  2.0-2.2 L    Margery ORN, RD, LDN, CDCES Registered Dietitian III Certified Diabetes Care and Education Specialist If unable to reach this RD, please use RD Inpatient group chat on secure chat between hours of 8am-4 pm daily

## 2024-03-16 NOTE — Inpatient Diabetes Management (Signed)
 Inpatient Diabetes Program Recommendations  AACE/ADA: New Consensus Statement on Inpatient Glycemic Control   Target Ranges:  Prepandial:   less than 140 mg/dL      Peak postprandial:   less than 180 mg/dL (1-2 hours)      Critically ill patients:  140 - 180 mg/dL    Latest Reference Range & Units 03/16/24 03:24  Glucose 70 - 99 mg/dL 811 (H)    Latest Reference Range & Units 03/02/24 18:05 03/04/24 14:00 03/08/24 09:34 03/11/24 12:55 03/14/24 20:32  Glucose-Capillary 70 - 99 mg/dL 843 (H) 839 (H) 838 (H) 232 (H) 324 (H)    Review of Glycemic Control  Diabetes history: DM2 Outpatient Diabetes medications: Metformin  XR 500 mg daily Current orders for Inpatient glycemic control: None  Inpatient Diabetes Program Recommendations:    Insulin : Lab glucose 188 mg/dl today.  Please consider ordering Novolog  0-9 units TID with meals and Novolog  0-5 units at bedtime.  Thanks, Earnie Gainer, RN, MSN, CDCES Diabetes Coordinator Inpatient Diabetes Program [phone] (Team Pager from 8am to 5pm)

## 2024-03-16 NOTE — TOC Progression Note (Signed)
 Transition of Care [facility]) - Progression Note    Patient Details  Name: Jack White MRN: [id number] Date of Birth: 1940-10-04  [facility] CM/SW Contact  Lauraine [name], LCSW Phone Number: 03/16/2024, 1:45 PM  Clinical Narrative:  MD is hopeful for discharge tomorrow. [facility] admissions coordinator is aware.   Expected Discharge Plan: Home/Self Care Barriers to Discharge: Continued Medical Work up               Expected Discharge Plan and Services     Post Acute Care Choice: NA Living arrangements for the past 2 months: Single Family Home                                       Social Drivers of Health (SDOH) Interventions SDOH Screenings   Food Insecurity: No Food Insecurity (03/03/2024)  Housing: Low Risk (03/03/2024)  Transportation Needs: No Transportation Needs (03/03/2024)  Utilities: Not At Risk (03/03/2024)  Financial Resource Strain: High Risk (10/07/2023)   Received from [facility]  Social Connections: Moderately Integrated (03/03/2024)  Tobacco Use: Low Risk (03/14/2024)    Readmission Risk Interventions    03/05/2024   12:58 PM  Readmission Risk Prevention Plan  Transportation Screening Complete  PCP or Specialist Appt within 3-5 Days Complete  Social Work Consult for Recovery Care Planning/Counseling Complete  Palliative Care Screening Not Applicable  Medication Review Oceanographer) Complete

## 2024-03-16 NOTE — Progress Notes (Signed)
 " [facility]  ROUNDING NOTE   Subjective:   Patient seen sitting in chair No family present Appetite appropriate   Potassium 5.1  Objective:  Vital signs in last 24 hours:  Temp:  [97.4 F (36.3 C)-98.9 F (37.2 C)] 97.7 F (36.5 C) (01/20 1243) Pulse Rate:  [72-87] 87 (01/20 1243) Resp:  [14-20] 18 (01/20 1226) BP: (128-139)/(52-65) 134/52 (01/20 1243) SpO2:  [97 %-98 %] 98 % (01/20 1243)  Weight change:  Filed Weights   03/03/24 0600 03/10/24 0401 03/14/24 0538  Weight: 93.1 kg 95 kg 93.7 kg    Intake/Output: I/O last 3 completed shifts: In: 480 [P.O.:480] Out: 500 [Urine:500]   Intake/Output this shift:  Total I/O In: 120 [P.O.:120] Out: -   Physical Exam: General: NAD, seated in chair  Head: Normocephalic, atraumatic. Moist oral mucosal membranes  Eyes: Anicteric  Lungs:  Clear to auscultation, normal effort  Heart: Regular rate and rhythm  Abdomen:  Soft, nontender  Extremities:  No peripheral edema.  Neurologic: Awake, alert, conversant  Skin: Warm,dry, no rash       Basic Metabolic Panel: Recent Labs  Lab 03/13/24 0353 03/14/24 1741 03/15/24 0020 03/15/24 0427 03/16/24 0324  NA 132* 133* 135 134* 134*  K 5.5* 5.8* 5.8* 5.4* 5.1  CL 99 101 101 101 100  CO2 28 27 28 27 28   GLUCOSE 143* 168* 209* 148* 188*  BUN 40* 36* 35* 34* 32*  CREATININE 1.48* 1.33* 1.34* 1.33* 1.33*  CALCIUM  8.0* 7.9* 7.9* 8.2* 7.7*    Liver Function Tests: Recent Labs  Lab 03/13/24 0903  AST 28  ALT 33  ALKPHOS 126  BILITOT 0.3  PROT 6.0*  ALBUMIN  2.9*   No results for input(s): LIPASE, AMYLASE in the last 168 hours. No results for input(s): AMMONIA in the last 168 hours.  CBC: Recent Labs  Lab 03/11/24 0448 03/12/24 0445 03/13/24 0353 03/13/24 0903 03/16/24 0324  WBC 5.6 5.7 6.6 6.5 5.2  NEUTROABS  --   --   --  3.9  --   HGB 8.2* 7.8* 8.0* 8.2* 7.0*  HCT 25.2* 24.0* 24.1* 25.2* 22.1*  MCV 98.8 99.2 98.4 99.2 102.8*  PLT 176  188 201 192 188    Cardiac Enzymes: No results for input(s): CKTOTAL, CKMB, CKMBINDEX, TROPONINI in the last 168 hours.  BNP: Invalid input(s): POCBNP  CBG: Recent Labs  Lab 03/11/24 1255 03/14/24 2032  GLUCAP 232* 324*    Microbiology: Results for orders placed or performed during the hospital encounter of 07/21/23  MRSA Next Gen by PCR, Nasal     Status: None   Collection Time: 07/22/23  2:25 AM   Specimen: Nasal Mucosa; Nasal Swab  Result Value Ref Range Status   MRSA by PCR Next Gen NOT DETECTED NOT DETECTED Final    Comment: (NOTE) The GeneXpert MRSA Assay (FDA approved for NASAL specimens only), is one component of a comprehensive MRSA colonization surveillance program. It is not intended to diagnose MRSA infection nor to guide or monitor treatment for MRSA infections. Test performance is not FDA approved in patients less than 40 years old. Performed at [facility], [address] [place], [place] [address]   Culture, blood (Routine X 2) w Reflex to ID Panel     Status: None   Collection Time: 07/27/23  6:52 PM   Specimen: BLOOD  Result Value Ref Range Status   Specimen Description BLOOD BLOOD LEFT ARM  Final   Special Requests  Final    AEROBIC BOTTLE ONLY Blood Culture results may not be optimal due to an inadequate volume of blood received in culture bottles   Culture   Final    NO GROWTH 5 DAYS Performed at [facility], [address], [place], [place] [address]    Report Status 08/01/2023 FINAL  Final  Culture, blood (Routine X 2) w Reflex to ID Panel     Status: None   Collection Time: 07/27/23  6:54 PM   Specimen: BLOOD  Result Value Ref Range Status   Specimen Description BLOOD BLOOD LEFT HAND  Final   Special Requests   Final    AEROBIC BOTTLE ONLY Blood Culture results may not be optimal due to an inadequate volume of blood received in culture bottles   Culture   Final    NO GROWTH 5 DAYS Performed at [facility]  Lab, [address], [place], [place] [address]    Report Status 08/01/2023 FINAL  Final    Coagulation Studies: No results for input(s): LABPROT, INR in the last 72 hours.  Urinalysis: No results for input(s): COLORURINE, LABSPEC, PHURINE, GLUCOSEU, HGBUR, BILIRUBINUR, KETONESUR, PROTEINUR, UROBILINOGEN, NITRITE, LEUKOCYTESUR in the last 72 hours.  Invalid input(s): APPERANCEUR    Imaging: US  RENAL Result Date: 03/15/2024 CLINICAL DATA:  Acute kidney failure. EXAM: RENAL / URINARY TRACT ULTRASOUND COMPLETE COMPARISON:  None Available. FINDINGS: Right Kidney: Renal measurements: 8.7 cm x 4.7 cm x 4.8 cm = volume: 103.0 mL. Echogenicity within normal limits. A 3.5 cm x 3.2 cm x 3.0 cm simple cyst is seen within the right kidney. No hydronephrosis is visualized. Left Kidney: Renal measurements: 8.3 cm x 4.9 cm x 4.7 cm = volume: 99.1 mL. Echogenicity within normal limits. No mass or hydronephrosis visualized. Bladder: Appears normal for degree of bladder distention. Other: None. IMPRESSION: Simple right renal cyst. Electronically Signed   By: Suzen Dials M.D.   On: 03/15/2024 18:46     Medications:     sodium chloride    Intravenous Once   sodium chloride    Intravenous Once   vitamin C   500 mg Oral BID   aspirin   81 mg Oral Daily   atorvastatin   40 mg Oral QHS   carbamazepine   200 mg Oral TID   clopidogrel   75 mg Oral Q breakfast   diphenhydrAMINE   25 mg Oral QHS   DULoxetine   60 mg Oral Daily   gabapentin   300 mg Oral QHS   iron  polysaccharides  150 mg Oral Daily   melatonin  5 mg Oral QHS   metFORMIN   500 mg Oral Q breakfast   modafinil   100 mg Oral Daily   multivitamin with minerals  1 tablet Oral Daily   nutrition supplement (JUVEN)  1 packet Oral BID BM   polyethylene glycol  34 g Oral Daily   sodium zirconium cyclosilicate   10 g Oral BID   tamsulosin   0.4 mg Oral QPC breakfast   zinc  sulfate (50mg  elemental zinc )  220 mg Oral Daily    acetaminophen , ipratropium-albuterol , nitroGLYCERIN , ondansetron  (ZOFRAN ) IV, sodium chloride  flush  Assessment/ Plan:  [name] is a 84 y.o.  male with  past medical history of hypertension, BPH, diabetes, traumatic subdural s/p craniotomy, and chronic kidney disease stage IIIa, who was admitted to [facility] on 03/02/2024 for Troponin level elevated [R79.89] Chest pain, moderate coronary artery risk [R07.9] Chest pain [R07.9]   Hyperkalemia, potassium 5.3-5.8.  Secondary to diet versus retention.  Renal  ultrasound negative for hydronephrosis, simple cysts present. Potassium 5.1 today. Continue twice daily Lokelma  as ordered.   Hypertension, essential.  Home regimen includes doxazosin  and propranolol , both currently held. Blood pressure remains stable for this patient.   3. Chronic kidney disease stage IIIa, baseline creatinine 1.35 with GFR 52 back in August 2025.  Creatinine has peaked to 1.88 during this admission however has returned to baseline.   Remains at baseline  Lab Results  Component Value Date   CREATININE 1.33 (H) 03/16/2024   CREATININE 1.33 (H) 03/15/2024   CREATININE 1.34 (H) 03/15/2024    Intake/Output Summary (Last 24 hours) at 03/16/2024 1341 Last data filed at 03/16/2024 0930 Gross per 24 hour  Intake 420 ml  Output --  Net 420 ml       LOS: 12 Carolos Fecher 1/20/20261:41 PM   "

## 2024-03-16 NOTE — Plan of Care (Signed)

## 2024-03-16 NOTE — Progress Notes (Signed)
 OT Cancellation Note  Patient Details Name: BLANDON OFFERDAHL MRN: [id number] DOB: [birth year]   Cancelled Treatment:    Reason Eval/Treat Not Completed: Medical issues which prohibited therapy;Other (comment). OT continues to follow pt for therapy services. Pt noted with hgb of 7.0 to receive blood transfusion this PM. Will hold at this time and re-attempt at a later date/time as available and pt medically appropriate.   Jhonny Pelton, M.S., OTR/L 03/16/24, 3:04 PM

## 2024-03-16 NOTE — Progress Notes (Signed)
 " Progress Note   Patient: Jack White FMW:[id number] DOB: [birth year] DOA: 03/02/2024     12 DOS: the patient was seen and examined on 03/16/2024   Brief hospital course:  Jack White is a 84 y.o. 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  Patient has elevated D-dimer, CT angiogram was negative for PE. Patient also had a stress nuclear stress test, was positive for ischemia.  Patient had a heart cath performed on 1/8, placed LAD drug-eluting stent.   Principal Problem:   Precordial chest pain Active Problems:   Stage 3a chronic kidney disease (HCC)   Diabetes mellitus, type 2 (HCC)   Hypertension   Hyperkalemia   Parkinson disease (HCC)   Trigeminal neuralgia   BPH (benign prostatic hyperplasia)   History of traumatic subdural hematoma s/p craniotomy   Chest pain   Thrombocytopenia   Unstable angina (HCC)   Iron  deficiency anemia   Malnutrition of moderate degree   Assessment and Plan: Unstable angina with coronary disease Minimal elevation troponin. Negative for PE, positive nuclear stress test.   Heart cath showed 95% stenosis in the LAD.  Status post drug-eluting stent, will continue dual antiplatelet treatment. Denies chest pain   Acute blood loss anemia. Iron  deficient anemia. Thrombocytopenia. Transfused one unit 1/13, hgb 7 today given hx cad will transfuse an additional unit. W/u reveals mild iron  deficiency, normal b12, normal smear, no signs hemolysis. Last bm was brown, no signs of bleeding. Consider hematology consult if hgb fails to maintain after transfusion. Continuing oral iron . Is on asa/plavix   Stage 3a chronic kidney disease (HCC) Hyperkalemia Hyponatremia. Persistent hyperkalemia. Despite lokelma . Nephrology involved 1/19, advised bid dosing of lokelma  and trial  of d/c lovenox . Renal u/s no obstruction (bladder enlarged but bladder scan today less than 300). Potassium improved with increased frequency of lokelma  to 5.1 today will likely need to continue that at discharge  History of traumatic subdural hematoma s/p craniotomy No acute issues   BPH (benign prostatic hyperplasia) Continue tamsulosin    Parkinson disease (HCC) Trigeminal neuralgia Continue gabapentin  and carbamazepine  Resume home duloxetine    Hypertension Continue home meds   Diabetes mellitus, type 2 (HCC) Mild hyperglycemic - resume home metformin    Generalized weakness. Pt advising snf, has bed, has auth   Constipation Last bm 1/17, brown. Lactulose  yesterday, will re-dose today, continue miralax     Subjective:   Sitting in recliner, breathing comfortably, no complaints. No bm last 24  Physical Exam: Vitals:   03/15/24 1703 03/15/24 2037 03/16/24 0529 03/16/24 0842  BP: 139/65 (!) 137/55 (!) 128/58 133/61  Pulse: 85 87 72 76  Resp: 18 19 20 14   Temp: 98.3 F (36.8 C) 98.4 F (36.9 C) 97.8 F (36.6 C) 98.9 F (37.2 C)  TempSrc: Oral   Oral  SpO2: 97% 97% 97% 98%  Weight:       General exam: Appears calm and comfortable  Respiratory system: Clear to auscultation. Respiratory effort normal. Cardiovascular system: S1 & S2 heard, RRR.   Gastrointestinal system: Abdomen is nondistended, soft and nontender.   Central nervous system: awake, alert, moving all 4 Extremities: warm Skin: No rashes, lesions or ulcers Psychiatry: calm   Data Reviewed:  CT and lab results reviewed.  Family Communication: Daughter updated at bedside 1/20  Disposition: Status is: Inpatient Remains inpatient appropriate because: persistent hyperkalemia  Author: Devaughn White  Samoria Fedorko, MD 03/16/2024 12:06 PM  For on call review [email or web address].    "

## 2024-03-16 NOTE — Progress Notes (Signed)
 PT Cancellation Note  Patient Details Name: Jack White MRN: [id number] DOB: [birth year]   Cancelled Treatment:    Reason Eval/Treat Not Completed: Medical issues which prohibited therapy  Pt to receive blood this pm.  Will continue tomorrow as appropriate.   Lauraine Gills 03/16/2024, 1:38 PM

## 2024-03-17 ENCOUNTER — Other Ambulatory Visit: Payer: Self-pay

## 2024-03-17 DIAGNOSIS — D509 Iron deficiency anemia, unspecified: Secondary | ICD-10-CM

## 2024-03-17 DIAGNOSIS — R072 Precordial pain: Secondary | ICD-10-CM | POA: Diagnosis not present

## 2024-03-17 LAB — CBC
HCT: 26 % — ABNORMAL LOW (ref 39.0–52.0)
Hemoglobin: 8.4 g/dL — ABNORMAL LOW (ref 13.0–17.0)
MCH: 31.9 pg (ref 26.0–34.0)
MCHC: 32.3 g/dL (ref 30.0–36.0)
MCV: 98.9 fL (ref 80.0–100.0)
Platelets: 174 K/uL (ref 150–400)
RBC: 2.63 MIL/uL — ABNORMAL LOW (ref 4.22–5.81)
RDW: 14.8 % (ref 11.5–15.5)
WBC: 5.1 K/uL (ref 4.0–10.5)
nRBC: 0 % (ref 0.0–0.2)

## 2024-03-17 LAB — BPAM RBC
Blood Product Expiration Date: 202602142359
ISSUE DATE / TIME: 202601201221
Unit Type and Rh: 5100

## 2024-03-17 LAB — BASIC METABOLIC PANEL WITH GFR
Anion gap: 5 (ref 5–15)
BUN: 35 mg/dL — ABNORMAL HIGH (ref 8–23)
CO2: 28 mmol/L (ref 22–32)
Calcium: 8.1 mg/dL — ABNORMAL LOW (ref 8.9–10.3)
Chloride: 99 mmol/L (ref 98–111)
Creatinine, Ser: 1.31 mg/dL — ABNORMAL HIGH (ref 0.61–1.24)
GFR, Estimated: 53 mL/min — ABNORMAL LOW
Glucose, Bld: 156 mg/dL — ABNORMAL HIGH (ref 70–99)
Potassium: 5.1 mmol/L (ref 3.5–5.1)
Sodium: 132 mmol/L — ABNORMAL LOW (ref 135–145)

## 2024-03-17 LAB — TYPE AND SCREEN
ABO/RH(D): O POS
Antibody Screen: NEGATIVE
Unit division: 0

## 2024-03-17 MED ORDER — SODIUM ZIRCONIUM CYCLOSILICATE 10 G PO PACK: 10.0000 g | PACK | Freq: Two times a day (BID) | ORAL | Status: AC

## 2024-03-17 MED ORDER — FLUTICASONE PROPIONATE 50 MCG/ACT NA SUSP: 2.0000 | Freq: Every day | NASAL | Status: AC | PRN

## 2024-03-17 MED ORDER — JUVEN PO PACK: 1.0000 | PACK | Freq: Two times a day (BID) | ORAL | Status: AC

## 2024-03-17 MED ORDER — ATORVASTATIN CALCIUM 10 MG PO TABS: 40.0000 mg | ORAL_TABLET | Freq: Every day | ORAL | Status: AC

## 2024-03-17 MED ORDER — ADULT MULTIVITAMIN W/MINERALS CH: 1.0000 | ORAL_TABLET | Freq: Every day | ORAL | Status: AC

## 2024-03-17 MED ORDER — ASCORBIC ACID 500 MG PO TABS: 500.0000 mg | ORAL_TABLET | Freq: Two times a day (BID) | ORAL | Status: AC

## 2024-03-17 MED ORDER — ASPIRIN 81 MG PO CHEW: 81.0000 mg | CHEWABLE_TABLET | Freq: Every day | ORAL | Status: AC

## 2024-03-17 MED ORDER — TAMSULOSIN HCL 0.4 MG PO CAPS: 0.4000 mg | ORAL_CAPSULE | Freq: Every day | ORAL | Status: AC

## 2024-03-17 MED ORDER — ZINC SULFATE 220 (50 ZN) MG PO CAPS: 220.0000 mg | ORAL_CAPSULE | Freq: Every day | ORAL | Status: AC

## 2024-03-17 MED ORDER — CLOPIDOGREL BISULFATE 75 MG PO TABS: 75.0000 mg | ORAL_TABLET | Freq: Every day | ORAL | Status: AC

## 2024-03-17 MED ORDER — SODIUM BICARBONATE 650 MG PO TABS
1300.0000 mg | ORAL_TABLET | Freq: Two times a day (BID) | ORAL | Status: DC
  Administered 2024-03-17: 1300 mg via ORAL
  Filled 2024-03-17: qty 2

## 2024-03-17 NOTE — TOC Transition Note (Addendum)
 Transition of Care [facility]) - Discharge Note   Patient Details  Name: Jack White MRN: [id number] Date of Birth: August 06, 1940  [facility]) CM/SW Contact:  Lauraine [name], LCSW Phone Number: 03/17/2024, 1:11 PM   Clinical Narrative:   Patient has orders to discharge to [facility] today. RN will call report to [phone] (Room 809). [facility] has been arranged and he is 11th on the list. Left daughter a voicemail to update. Tried updating patient but he was asleep and hard to arouse. No further concerns. CSW signing off.  1:15 pm: Received call back from daughter. Provided update.  Final next level of care: Skilled Nursing Facility Barriers to Discharge: Barriers Resolved   Patient Goals and CMS Choice     Choice offered to / list presented to : Adult Children      Discharge Placement   Existing PASRR number confirmed : 03/10/24          Patient chooses bed at: [facility] [place] Patient to be transferred to facility by: [facility] Name of family member notified: Reena Cork Patient and family notified of of transfer: 03/17/24  Discharge Plan and Services Additional resources added to the After Visit Summary for       Post Acute Care Choice: NA                               Social Drivers of Health (SDOH) Interventions SDOH Screenings   Food Insecurity: No Food Insecurity (03/03/2024)  Housing: Low Risk (03/03/2024)  Transportation Needs: No Transportation Needs (03/03/2024)  Utilities: Not At Risk (03/03/2024)  Financial Resource Strain: High Risk (10/07/2023)   Received [facility]  Social Connections: Moderately Integrated (03/03/2024)  Tobacco Use: Low Risk (03/14/2024)     Readmission Risk Interventions    03/05/2024   12:58 PM  Readmission Risk Prevention Plan  Transportation Screening Complete  PCP or Specialist Appt within 3-5 Days Complete  Social Work Consult for Recovery Care  Planning/Counseling Complete  Palliative Care Screening Not Applicable  Medication Review Oceanographer) Complete

## 2024-03-17 NOTE — Discharge Summary (Signed)
 " [facility] Physician Discharge Summary   Patient name: Jack White  Admit date:     03/02/2024  Discharge date: 03/17/2024  Attending Physician: ZHANG, DEKUI [[id number]]  Discharge Physician: Norval Bar   PCP: Diedra Lame, MD  Admitted From: Home   Disposition:  [facility]  SNF  Recommendations for Outpatient Follow-up:  Follow up with PCP in 1-2 weeks Follow up with cardiology within 2 weeks Follow up with cardiac rehab   Home Health:No Equipment/Devices: @ECDMELIST @  Discharge Condition:Stable CODE STATUS:DNR only Diet recommendation: Heart Healthy/Diabetic Fluid Restriction: None  Hospital Summary: Jack White is a 84 y.o. 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  Patient has elevated D-dimer, CT angiogram was negative for PE. Patient also had a stress nuclear stress test, was positive for ischemia.  Patient had a heart cath performed on 1/8, placed LAD drug-eluting stent.  Hospital Course by Problem:  Unstable angina with coronary disease Minimal elevation troponin. Negative for PE, positive nuclear stress test.   Heart cath showed 95% stenosis in the LAD.  Status post drug-eluting stent, will continue dual antiplatelet treatment. Denies chest pain since then. Given referral for cardiac rehab and cardiology clinic outpatient   Acute blood loss anemia. Iron  deficient anemia. Thrombocytopenia. Transfused one unit 1/13 and 1/20. No signs/symptoms of acute bleed. Hgb stable at 8.4 now. W/u revealed mild iron  deficiency, normal b12, normal smear, no signs hemolysis. Last bm was brown, no signs of bleeding.  Cont DAPT given LAD stent placed this admission Can consider hematology consult if hgb fails to maintain after transfusion. Continuing oral iron . Is on  asa/plavix    Stage 3a chronic kidney disease (HCC) Hyperkalemia Hyponatremia. Persistent hyperkalemia. Despite lokelma . Nephrology involved 1/19, advised bid dosing of lokelma  and trial of d/c lovenox . Renal u/s no obstruction (bladder enlarged but bladder scan today less than 300). Potassium improved with increased frequency of lokelma  to 5.1. Will continue continue at discharge   Generalized weakness. Seen by PT. Agreeable to snf discharge at this time.  Discharge Diagnoses:  Principal Problem:   Precordial chest pain Active Problems:   Stage 3a chronic kidney disease (HCC)   Diabetes mellitus, type 2 (HCC)   Hypertension   Hyperkalemia   Parkinson disease (HCC)   Trigeminal neuralgia   BPH (benign prostatic hyperplasia)   History of traumatic subdural hematoma s/p craniotomy   Chest pain   Thrombocytopenia   Unstable angina (HCC)   Iron  deficiency anemia   Malnutrition of moderate degree   Discharge Instructions  Discharge Instructions     AMB Referral to Cardiac Rehabilitation - Phase II   Complete by: As directed    Diagnosis: Stable Angina   After initial evaluation and assessments completed: Virtual Based Care may be provided alone or in conjunction with Phase 2 Cardiac Rehab based on patient barriers.: Yes   Ambulatory referral to Cardiology   Complete by: As directed    Discharge wound care:   Complete by: As directed    Cleanse scrotal wound/skin tear with NS, apply Xeroform gauze (Lawson [phone]) to wound bed daily and secure with ABD pad and mesh underwear or silicone foam whichever works better.   Increase activity slowly   Complete by: As directed       Allergies as of 03/18/2024       Reactions   Cantaloupe (diagnostic)  Dilaudid [hydromorphone Hcl] Other (See Comments)   Respiratory arrest        Medication List     STOP taking these medications    butalbital -aspirin -caffeine  50-325-40 MG capsule Commonly known as: FIORINAL   propranolol   20 MG tablet Commonly known as: INDERAL        TAKE these medications    acetaminophen  500 MG tablet Commonly known as: TYLENOL  Take 500-1,000 mg by mouth 2 (two) times daily.   ascorbic acid  500 MG tablet Commonly known as: VITAMIN C  Take 1 tablet (500 mg total) by mouth 2 (two) times daily for 14 days.   aspirin  81 MG chewable tablet Chew 1 tablet (81 mg total) by mouth daily.   atorvastatin  10 MG tablet Commonly known as: LIPITOR Take 4 tablets (40 mg total) by mouth at bedtime. What changed: how much to take   carbamazepine  200 MG tablet Commonly known as: TEGRETOL  Take 1 tablet (200 mg total) by mouth 3 (three) times daily.   clopidogrel  75 MG tablet Commonly known as: PLAVIX  Take 1 tablet (75 mg total) by mouth daily with breakfast.   diphenhydrAMINE  25 MG tablet Commonly known as: BENADRYL  Take 2 tablets (50 mg total) by mouth at bedtime.   doxazosin  4 MG tablet Commonly known as: CARDURA  Take 4 mg by mouth daily.   DULoxetine  60 MG capsule Commonly known as: CYMBALTA  Take 60 mg by mouth daily.   ergocalciferol  1.25 MG (50000 UT) capsule Commonly known as: VITAMIN D2 Take 50,000 Units by mouth once a week.   ferrous sulfate  325 (65 FE) MG tablet Take 1 tablet (325 mg total) by mouth daily with breakfast.   fluticasone  50 MCG/ACT nasal spray Commonly known as: FLONASE  Place 2 sprays into both nostrils daily as needed for allergies.   folic acid  1 MG tablet Commonly known as: FOLVITE  Take 1 tablet (1 mg total) by mouth daily.   gabapentin  300 MG capsule Commonly known as: NEURONTIN  Take 300 mg by mouth at bedtime.   guaiFENesin -dextromethorphan 100-10 MG/5ML syrup Commonly known as: ROBITUSSIN DM Take 5 mLs by mouth every 4 (four) hours as needed for cough.   hydrocortisone  cream 1 % Apply 1 Application topically 3 (three) times daily as needed for itching.   hydroxypropyl methylcellulose / hypromellose 2.5 % ophthalmic solution Commonly  known as: ISOPTO TEARS / GONIOVISC Place 2 drops into both eyes 4 (four) times daily as needed for dry eyes.   ipratropium-albuterol  0.5-2.5 (3) MG/3ML Soln Commonly known as: DUONEB Take 3 mLs by nebulization every 6 (six) hours as needed (wheezing).   Melatonin 10 MG Tabs Take 10 mg by mouth at bedtime.   metFORMIN  500 MG 24 hr tablet Commonly known as: GLUCOPHAGE -XR Take 500 mg by mouth daily.   modafinil  100 MG tablet Commonly known as: PROVIGIL  Take 1 tablet (100 mg total) by mouth daily.   montelukast  10 MG tablet Commonly known as: SINGULAIR  Take 10 mg by mouth at bedtime.   multivitamin with minerals Tabs tablet Take 1 tablet by mouth daily.   nutrition supplement (JUVEN) Pack Take 1 packet by mouth 2 (two) times daily between meals.   omeprazole 20 MG capsule Commonly known as: PRILOSEC Take 20 mg by mouth daily.   polyethylene glycol 17 g packet Commonly known as: MIRALAX  / GLYCOLAX  Take 17 g by mouth daily as needed.   sennosides 8.8 MG/5ML syrup Commonly known as: SENOKOT Take 5 mLs by mouth at bedtime.   sodium bicarbonate  650 MG tablet Take 2  tablets (1,300 mg total) by mouth 2 (two) times daily.   sodium zirconium cyclosilicate  10 g Pack packet Commonly known as: LOKELMA  Take 10 g by mouth 2 (two) times daily.   tamsulosin  0.4 MG Caps capsule Commonly known as: FLOMAX  Take 1 capsule (0.4 mg total) by mouth daily after breakfast.   zinc  sulfate (50mg  elemental zinc ) 220 (50 Zn) MG capsule Take 1 capsule (220 mg total) by mouth daily for 14 days.               Discharge Care Instructions  (From admission, onward)           Start     Ordered   03/17/24 0000  Discharge wound care:       Comments: Cleanse scrotal wound/skin tear with NS, apply Xeroform gauze Soila [phone]) to wound bed daily and secure with ABD pad and mesh underwear or silicone foam whichever works better.   03/17/24 1247            Contact information for  follow-up providers     Alluri, Keller BROCKS, MD. Go in 1 week(s).   Specialty: Cardiology Contact information: [address] [place] [address] [phone]              Contact information for after-discharge care     Destination     [facility] [place], [place]. [place]   Service: Skilled Nursing Contact information: [address] [place]  [address] [phone]                    Allergies[1]  Discharge Exam: Vitals:   03/17/24 0751 03/17/24 1156  BP: (!) 152/67 120/65  Pulse: 78 74  Resp: 19 15  Temp: 98.2 F (36.8 C) 98.1 F (36.7 C)  SpO2: 99% 95%    Physical Exam Vitals and nursing note reviewed.  Constitutional:      General: He is not in acute distress.    Appearance: He is ill-appearing.     Comments: Weak, frail  HENT:     Head: Normocephalic and atraumatic.  Cardiovascular:     Rate and Rhythm: Normal rate and regular rhythm.     Pulses: Normal pulses.     Heart sounds: Normal heart sounds.  Pulmonary:     Effort: Pulmonary effort is normal.     Breath sounds: Normal breath sounds.  Abdominal:     General: Bowel sounds are normal.     Palpations: Abdomen is soft.  Neurological:     Mental Status: He is alert. Mental status is at baseline.     The results of significant diagnostics from this hospitalization (including imaging, microbiology, ancillary and laboratory) are listed below for reference.    Microbiology: No results found for this or any previous visit (from the past 240 hours).   Labs: ProBNP, BNP (last 5 results) No results for input(s): PROBNP, BNP in the last 8760 hours. Basic Metabolic Panel: Recent Labs  Lab 03/14/24 1741 03/15/24 0020 03/15/24 0427 03/16/24 0324 03/17/24 0519  NA 133* 135 134* 134* 132*  K 5.8* 5.8* 5.4* 5.1 5.1  CL 101 101 101 100 99  CO2 27 28 27 28 28   GLUCOSE 168* 209* 148* 188* 156*  BUN 36* 35* 34* 32* 35*  CREATININE 1.33* 1.34* 1.33* 1.33* 1.31*   CALCIUM  7.9* 7.9* 8.2* 7.7* 8.1*   Liver Function Tests: Recent Labs  Lab 03/13/24 0903  AST 28  ALT 33  ALKPHOS 126  BILITOT 0.3  PROT 6.0*  ALBUMIN  2.9*   No results for input(s): LIPASE, AMYLASE in the last 168 hours. No results for input(s): AMMONIA in the last 168 hours. CBC: Recent Labs  Lab 03/12/24 0445 03/13/24 0353 03/13/24 0903 03/16/24 0324 03/17/24 0519  WBC 5.7 6.6 6.5 5.2 5.1  NEUTROABS  --   --  3.9  --   --   HGB 7.8* 8.0* 8.2* 7.0* 8.4*  HCT 24.0* 24.1* 25.2* 22.1* 26.0*  MCV 99.2 98.4 99.2 102.8* 98.9  PLT 188 201 192 188 174   Cardiac Enzymes: No results for input(s): CKTOTAL, CKMB, CKMBINDEX, TROPONINI, TROPONINIHS in the last 168 hours. BNP: No results for input(s): BNP in the last 168 hours. CBG: Recent Labs  Lab 03/11/24 1255 03/14/24 2032  GLUCAP 232* 324*   D-Dimer No results for input(s): DDIMER in the last 72 hours. Hgb A1c No results for input(s): HGBA1C in the last 72 hours. Lipid Profile No results for input(s): CHOL, HDL, LDLCALC, TRIG, CHOLHDL, LDLDIRECT in the last 72 hours. Thyroid  function studies No results for input(s): TSH, T4TOTAL, FREET4, T3FREE, THYROIDAB in the last 72 hours.  Invalid input(s): FREET3 Anemia work up No results for input(s): VITAMINB12, FOLATE, FERRITIN, TIBC, IRON , RETICCTPCT in the last 72 hours. Urinalysis    Component Value Date/Time   COLORURINE YELLOW (A) 03/03/2024 0158   APPEARANCEUR HAZY (A) 03/03/2024 0158   APPEARANCEUR Clear 06/22/2014 2230   LABSPEC 1.025 03/03/2024 0158   LABSPEC 1.028 06/22/2014 2230   PHURINE 7.0 03/03/2024 0158   GLUCOSEU NEGATIVE 03/03/2024 0158   GLUCOSEU Negative 06/22/2014 2230   HGBUR NEGATIVE 03/03/2024 0158   BILIRUBINUR NEGATIVE 03/03/2024 0158   BILIRUBINUR Negative 06/22/2014 2230   KETONESUR NEGATIVE 03/03/2024 0158   PROTEINUR NEGATIVE 03/03/2024 0158   NITRITE NEGATIVE 03/03/2024 0158    LEUKOCYTESUR TRACE (A) 03/03/2024 0158   LEUKOCYTESUR Negative 06/22/2014 2230   Sepsis Labs Recent Labs  Lab 03/13/24 0353 03/13/24 0903 03/16/24 0324 03/17/24 0519  WBC 6.6 6.5 5.2 5.1    Procedures/Studies: US  RENAL Result Date: 03/15/2024 CLINICAL DATA:  Acute kidney failure. EXAM: RENAL / URINARY TRACT ULTRASOUND COMPLETE COMPARISON:  None Available. FINDINGS: Right Kidney: Renal measurements: 8.7 cm x 4.7 cm x 4.8 cm = volume: 103.0 mL. Echogenicity within normal limits. A 3.5 cm x 3.2 cm x 3.0 cm simple cyst is seen within the right kidney. No hydronephrosis is visualized. Left Kidney: Renal measurements: 8.3 cm x 4.9 cm x 4.7 cm = volume: 99.1 mL. Echogenicity within normal limits. No mass or hydronephrosis visualized. Bladder: Appears normal for degree of bladder distention. Other: None. IMPRESSION: Simple right renal cyst. Electronically Signed   By: Suzen Dials M.D.   On: 03/15/2024 18:46   CARDIAC CATHETERIZATION Result Date: 03/08/2024   Mid LAD lesion is 95% stenosed.   A drug-eluting stent was successfully placed using a STENT ONYX FRONTIER 2.75X15.   Post intervention, there is a 0% residual stenosis.   The left ventricular systolic function is normal.   LV end diastolic pressure is normal.   The left ventricular ejection fraction is 55-65% by visual estimate.   There is no mitral valve regurgitation.   In the absence of any other complications or medical issues, we expect the patient to be ready for discharge from an interventional cardiology perspective.   Recommend uninterrupted dual antiplatelet therapy with Aspirin  81mg  daily and Clopidogrel  75mg  daily for a minimum of 12 months (ACS-Class I recommendation). Conclusion Inpatient left heart cath right radial  approach because of positive imaging angina 6 French sheath placed in right radial artery Left ventriculogram showed normal left ventricular function EF of 55% Coronaries Left main large free of disease LAD large  25% proximal 95% mid focal Circumflex very large left dominant free of disease RCA small nondominant Intervention Successful PCI and stent of mid LAD with deployment of a 2.5 x 15 mm frontier Onyx postdilated with a 275 x 12 mm Converse neo up to 16 atm Lesion reduced from 95 down to 0 TIMI-3 flow maintained throughout Patient tolerated seizure well No complication Patient maintained on aspirin  and Plavix  for at least 6 months Anticipate discharge within 48 to 72 hours   CT HEAD WO CONTRAST ( ) Result Date: 03/08/2024 EXAM: CT HEAD WITHOUT CONTRAST 03/08/2024 01:58:26 PM TECHNIQUE: CT of the head was performed without the administration of intravenous contrast. Automated exposure control, iterative reconstruction, and/or weight based adjustment of the mA/kV was utilized to reduce the radiation dose to as low as reasonably achievable. COMPARISON: None available. CLINICAL HISTORY: Neuro deficit, acute, stroke suspected Neuro deficit, acute, stroke suspected FINDINGS: BRAIN AND VENTRICLES: No acute hemorrhage. No evidence of acute infarct. 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 No hydrocephalus. No extra-axial collection. No mass effect or midline shift. Previous right frontal craniotomy with stable underlying postsurgical changes. ORBITS: No acute abnormality. SINUSES: No acute abnormality. SOFT TISSUES AND SKULL: No acute soft tissue abnormality. No skull fracture. IMPRESSION: 1. No acute intracranial abnormality. 2. Previous right frontal craniotomy with stable underlying postsurgical changes. Electronically signed by: Gilmore Molt MD MD 03/08/2024 02:38 PM EST RP Workstation: [id number]   ECHOCARDIOGRAM COMPLETE Result Date: 03/05/2024    ECHOCARDIOGRAM REPORT   Patient Name:   ELCHANAN BOB Date of Exam: 03/04/2024 Medical Rec #:  [id number]       Height:       75.0 in Accession #:    [id number]      Weight:       205.2 lb Date of Birth:  08-17-1940      BSA:          2.219 m  Patient Age:    83 years        BP:           95/53 mmHg Patient Gender: M               HR:           56 bpm. Exam Location:  [facility] Procedure: 2D Echo, Cardiac Doppler, Color Doppler and Strain Analysis (Both            Spectral and Color Flow Doppler were utilized during procedure). Indications:     Chest pain R07.9  History:         Patient has no prior history of Echocardiogram examinations.                  Previous Myocardial Infarction; Risk Factors:Hypertension.  Sonographer:     Christopher Furnace Referring Phys:  [id number] DELAYNE LULLA SOLIAN Diagnosing Phys: Cara JONETTA Lovelace MD  Sonographer Comments: Suboptimal parasternal window. Global longitudinal strain was attempted. IMPRESSIONS  1. Left ventricular ejection fraction, by estimation, is 55 to 60%. The left ventricle has normal function. The left ventricle has no regional wall motion abnormalities. There is moderate concentric left ventricular hypertrophy. Left ventricular diastolic parameters are consistent with Grade I diastolic dysfunction (impaired relaxation). The average left ventricular global longitudinal strain is 16.3 %. The  global longitudinal strain is normal.  2. Right ventricular systolic function is normal. The right ventricular size is normal.  3. The mitral valve is normal in structure. Trivial mitral valve regurgitation.  4. The aortic valve is normal in structure. Aortic valve regurgitation is not visualized. Aortic valve sclerosis/calcification is present, without any evidence of aortic stenosis. FINDINGS  Left Ventricle: Left ventricular ejection fraction, by estimation, is 55 to 60%. The left ventricle has normal function. The left ventricle has no regional wall motion abnormalities. The average left ventricular global longitudinal strain is 16.3 %. Strain was performed and the global longitudinal strain is normal. The left ventricular internal cavity size was normal in size. There is moderate concentric left ventricular hypertrophy. Left  ventricular diastolic parameters are consistent with Grade I diastolic dysfunction (impaired relaxation). Right Ventricle: The right ventricular size is normal. No increase in right ventricular wall thickness. Right ventricular systolic function is normal. Left Atrium: Left atrial size was normal in size. Right Atrium: Right atrial size was normal in size. Pericardium: There is no evidence of pericardial effusion. Mitral Valve: The mitral valve is normal in structure. Trivial mitral valve regurgitation. MV peak gradient, 4.2 mmHg. The mean mitral valve gradient is 1.0 mmHg. Tricuspid Valve: The tricuspid valve is normal in structure. Tricuspid valve regurgitation is trivial. Aortic Valve: The aortic valve is normal in structure. Aortic valve regurgitation is not visualized. Aortic valve sclerosis/calcification is present, without any evidence of aortic stenosis. Aortic valve mean gradient measures 4.0 mmHg. Aortic valve peak  gradient measures 6.9 mmHg. Aortic valve area, by VTI measures 1.86 cm. Pulmonic Valve: The pulmonic valve was normal in structure. Pulmonic valve regurgitation is not visualized. Aorta: The aortic root was not well visualized. IAS/Shunts: No atrial level shunt detected by color flow Doppler. Additional Comments: 3D was performed not requiring image post processing on an independent workstation and was indeterminate.  LEFT VENTRICLE PLAX 2D LVIDd:         4.30 cm   Diastology LVIDs:         3.00 cm   LV e' medial:    4.79 cm/s LV PW:         1.40 cm   LV E/e' medial:  13.0 LV IVS:        1.50 cm   LV e' lateral:   8.59 cm/s LVOT diam:     2.20 cm   LV E/e' lateral: 7.3 LV SV:         53 LV SV Index:   24        2D Longitudinal Strain LVOT Area:     3.80 cm  2D Strain GLS (A4C):   18.0 % LV IVRT:       108 msec  2D Strain GLS (A3C):   16.2 %                          2D Strain GLS (A2C):   14.8 %                          2D Strain GLS Avg:     16.3 % RIGHT VENTRICLE RV Basal diam:  4.10 cm RV  Mid diam:    3.30 cm RV S prime:     13.10 cm/s TAPSE (M-mode): 2.0 cm LEFT ATRIUM             Index  RIGHT ATRIUM           Index LA diam:        3.10 cm 1.40 cm/m   RA Area:     21.60 cm LA Vol (A2C):   23.1 ml 10.41 ml/m  RA Volume:   63.50 ml  28.62 ml/m LA Vol (A4C):   25.3 ml 11.40 ml/m LA Biplane Vol: 25.2 ml 11.36 ml/m  AORTIC VALVE AV Area (Vmax):    1.91 cm AV Area (Vmean):   1.83 cm AV Area (VTI):     1.86 cm AV Vmax:           131.67 cm/s AV Vmean:          93.933 cm/s AV VTI:            0.284 m AV Peak Grad:      6.9 mmHg AV Mean Grad:      4.0 mmHg LVOT Vmax:         66.30 cm/s LVOT Vmean:        45.300 cm/s LVOT VTI:          0.139 m LVOT/AV VTI ratio: 0.49  AORTA Ao Root diam: 3.00 cm MITRAL VALVE               TRICUSPID VALVE MV Area (PHT): 1.85 cm    TR Peak grad:   12.0 mmHg MV Area VTI:   1.87 cm    TR Vmax:        173.00 cm/s MV Peak grad:  4.2 mmHg MV Mean grad:  1.0 mmHg    SHUNTS MV Vmax:       1.02 m/s    Systemic VTI:  0.14 m MV Vmean:      55.6 cm/s   Systemic Diam: 2.20 cm MV Decel Time: 410 msec MV E velocity: 62.40 cm/s MV A velocity: 91.20 cm/s MV E/A ratio:  0.68 Dwayne D Callwood MD Electronically signed by Cara JONETTA Lovelace MD Signature Date/Time: 03/05/2024/2:51:30 PM    Final    NM Myocar Multi W/Spect Marisela Motion / EF Result Date: 03/03/2024   Findings are consistent with ischemia. The study is high risk.   No ST deviation was noted.   LV perfusion is abnormal. There is evidence of ischemia. There is no evidence of infarction. Defect 1: There is a medium defect with moderate reduction in uptake present in the mid to basal inferolateral and lateral location(s) that is reversible. There is normal wall motion in the defect area. Consistent with ischemia.   Left ventricular function is normal. End diastolic cavity size is normal.   CT images were obtained for attenuation correction and were examined for the presence of coronary calcium  when appropriate. Conclusion  Abnormal myocardial perfusion scan large area of lateral lateral basal evidence of ischemia moderate intensity Ejection fraction normal around 58% with normal wall motion Consider further evaluation possibly invasively to rule out significant obstructive disease   CT Angio Chest PE W/Cm &/Or Wo Cm Result Date: 03/03/2024 EXAM: CTA of the Chest with contrast for PE 03/03/2024 12:26:56 AM TECHNIQUE: CTA of the chest was performed without and with the administration of 75 mL of iohexol  (OMNIPAQUE ) 350 MG/ML injection. Multiplanar reformatted images are provided for review. MIP images are provided for review. Automated exposure control, iterative reconstruction, and/or weight based adjustment of the mA/kV was utilized to reduce the radiation dose to as low as reasonably achievable. COMPARISON: 07/27/2023 CLINICAL HISTORY: Pulmonary embolism (PE) suspected, low to  intermediate prob, positive D-dimer. FINDINGS: PULMONARY ARTERIES: Pulmonary arteries are adequately opacified for evaluation. No pulmonary embolism. Main pulmonary artery is normal in caliber. MEDIASTINUM: Coronary artery and aortic atherosclerosis. LYMPH NODES: No mediastinal, hilar or axillary lymphadenopathy. LUNGS AND PLEURA: Scarring in the lingula and right middle lobe. No acute confluent airspace opacities. No pulmonary edema. No pleural effusion or pneumothorax. UPPER ABDOMEN: Limited images of the upper abdomen are unremarkable. SOFT TISSUES AND BONES: No acute bone or soft tissue abnormality. IMPRESSION: 1. No pulmonary embolism. 2. No acute pulmonary abnormality. 3. Coronary artery disease. Aortic atherosclerosis. Electronically signed by: Franky Crease MD 03/03/2024 12:33 AM EST RP Workstation: [id number]   CT Head Wo Contrast Result Date: 03/02/2024 CLINICAL DATA:  Fall 2 weeks ago.  Episode of emesis today. EXAM: CT HEAD WITHOUT CONTRAST TECHNIQUE: Contiguous axial images were obtained from the base of the skull through the vertex without  intravenous contrast. RADIATION DOSE REDUCTION: This exam was performed according to the departmental dose-optimization program which includes automated exposure control, adjustment of the mA and/or kV according to patient size and/or use of iterative reconstruction technique. COMPARISON:  09/19/2023, 07/21/2023 FINDINGS: Brain: Ventricles, cisterns and other CSF spaces are unchanged and within normal. There is minimal chronic ischemic microvascular disease present. No mass, mass effect, shift of midline structures or acute hemorrhage. Postsurgical/posttraumatic sequelae with thickening/calcification of the right frontal subdural region unchanged from 09/19/2023. This directly underlies the right frontal craniotomy defect. Vascular: No hyperdense vessel or unexpected calcification. Skull: Evidence of previous right frontal craniectomy. Sinuses/Orbits: Hypoplastic frontal sinuses as the paranasal sinuses are otherwise clear. Mastoid air cells are clear. Orbits are normal. Other: None. IMPRESSION: 1. No acute findings. 2. Minimal chronic ischemic microvascular disease. 3. Previous right frontal craniotomy with stable underlying postsurgical changes. Electronically Signed   By: Toribio Agreste M.D.   On: 03/02/2024 16:31   DG Chest 2 View Result Date: 03/02/2024 EXAM: 2 VIEW(S) XRAY OF THE CHEST 03/02/2024 03:51:00 PM COMPARISON: 08/31/2023 CLINICAL HISTORY: CP FINDINGS: LUNGS AND PLEURA: Low lung volumes. No focal pulmonary opacity. No pleural effusion. No pneumothorax. HEART AND MEDIASTINUM: Cardiomegaly. Tortuous thoracic aorta. Atherosclerotic calcifications. BONES AND SOFT TISSUES: Multilevel degenerative changes of thoracic spine. No acute osseous abnormality. IMPRESSION: 1. No acute findings. 2. Cardiomegaly. Electronically signed by: Greig Pique MD 03/02/2024 04:29 PM EST RP Workstation: [id number]    Time coordinating discharge: 45 mins  SIGNED:  Norval Bar, MD [facility] 03/17/24, 12:48  PM     [1]  Allergies Allergen Reactions   Cantaloupe (Diagnostic)    Dilaudid [Hydromorphone Hcl] Other (See Comments)    Respiratory arrest   "

## 2024-03-17 NOTE — Inpatient Diabetes Management (Addendum)
 Inpatient Diabetes Program Recommendations  AACE/ADA: New Consensus Statement on Inpatient Glycemic Control  Target Ranges:  Prepandial:   less than 140 mg/dL      Peak postprandial:   less than 180 mg/dL (1-2 hours)      Critically ill patients:  140 - 180 mg/dL    Latest Reference Range & Units 03/16/24 03:24 03/17/24 05:19  Glucose 70 - 99 mg/dL 811 (H) 843 (H)   Review of Glycemic Control  Diabetes history: DM2 Outpatient Diabetes medications: Metformin  XR 500 mg daily Current orders for Inpatient glycemic control: Metformin  500 mg QAM   Inpatient Diabetes Program Recommendations:     Insulin : Lab glucose 188 mg/dl on 8/79/73 and 843 mg/dl today.  Please consider ordering Novolog  0-9 units TID with meals and Novolog  0-5 units at bedtime.   Thanks, Earnie Gainer, RN, MSN, CDCES Diabetes Coordinator Inpatient Diabetes Program [phone] (Team Pager from 8am to 5pm)

## 2024-03-17 NOTE — TOC Progression Note (Addendum)
 Transition of Care [facility]) - Progression Note    Patient Details  Name: Jack White MRN: [id number] Date of Birth: 1940-07-19  Transition of Care [facility]) CM/SW Contact  Lauraine [name], LCSW Phone Number: 03/17/2024, 10:09 AM  Clinical Narrative:  CSW called and updated daughter. She stated patient received a veteran discount when he was at [facility] this past summer. CSW emailed the financial counselor to see if he would be able to get this discount again.   10:33 am: [facility] would like to have the discharge summary before 2:00 today if possible. Sent secure chat to MD to notify.  Expected Discharge Plan: Home/Self Care Barriers to Discharge: Continued Medical Work up               Expected Discharge Plan and Services     Post Acute Care Choice: NA Living arrangements for the past 2 months: Single Family Home                                       Social Drivers of Health (SDOH) Interventions SDOH Screenings   Food Insecurity: No Food Insecurity (03/03/2024)  Housing: Low Risk (03/03/2024)  Transportation Needs: No Transportation Needs (03/03/2024)  Utilities: Not At Risk (03/03/2024)  Financial Resource Strain: High Risk (10/07/2023)   Received from [facility]  Social Connections: Moderately Integrated (03/03/2024)  Tobacco Use: Low Risk (03/14/2024)    Readmission Risk Interventions    03/05/2024   12:58 PM  Readmission Risk Prevention Plan  Transportation Screening Complete  PCP or Specialist Appt within 3-5 Days Complete  Social Work Consult for Recovery Care Planning/Counseling Complete  Palliative Care Screening Not Applicable  Medication Review Oceanographer) Complete

## 2024-03-17 NOTE — Progress Notes (Signed)
 " [facility]  ROUNDING NOTE   Subjective:   Patient seen sitting in chair Alert Room air Tolerating meals  Potassium 5.1  Objective:  Vital signs in last 24 hours:  Temp:  [97.4 F (36.3 C)-98.9 F (37.2 C)] 98.2 F (36.8 C) (01/21 0751) Pulse Rate:  [74-92] 78 (01/21 0751) Resp:  [17-20] 19 (01/21 0751) BP: (115-152)/(50-69) 152/67 (01/21 0751) SpO2:  [98 %-99 %] 99 % (01/21 0751)  Weight change:  Filed Weights   03/03/24 0600 03/10/24 0401 03/14/24 0538  Weight: 93.1 kg 95 kg 93.7 kg    Intake/Output: I/O last 3 completed shifts: In: 872.5 [P.O.:500; Blood:372.5] Out: 1700 [Urine:1700]   Intake/Output this shift:  No intake/output data recorded.  Physical Exam: General: NAD, seated in chair  Head: Normocephalic, atraumatic. Moist oral mucosal membranes  Eyes: Anicteric  Lungs:  Clear to auscultation, normal effort  Heart: Regular rate and rhythm  Abdomen:  Soft, nontender  Extremities:  No peripheral edema.  Neurologic: Awake, alert, conversant  Skin: Warm,dry, no rash       Basic Metabolic Panel: Recent Labs  Lab 03/14/24 1741 03/15/24 0020 03/15/24 0427 03/16/24 0324 03/17/24 0519  NA 133* 135 134* 134* 132*  K 5.8* 5.8* 5.4* 5.1 5.1  CL 101 101 101 100 99  CO2 27 28 27 28 28   GLUCOSE 168* 209* 148* 188* 156*  BUN 36* 35* 34* 32* 35*  CREATININE 1.33* 1.34* 1.33* 1.33* 1.31*  CALCIUM  7.9* 7.9* 8.2* 7.7* 8.1*    Liver Function Tests: Recent Labs  Lab 03/13/24 0903  AST 28  ALT 33  ALKPHOS 126  BILITOT 0.3  PROT 6.0*  ALBUMIN  2.9*   No results for input(s): LIPASE, AMYLASE in the last 168 hours. No results for input(s): AMMONIA in the last 168 hours.  CBC: Recent Labs  Lab 03/12/24 0445 03/13/24 0353 03/13/24 0903 03/16/24 0324 03/17/24 0519  WBC 5.7 6.6 6.5 5.2 5.1  NEUTROABS  --   --  3.9  --   --   HGB 7.8* 8.0* 8.2* 7.0* 8.4*  HCT 24.0* 24.1* 25.2* 22.1* 26.0*  MCV 99.2 98.4 99.2 102.8* 98.9  PLT  188 201 192 188 174    Cardiac Enzymes: No results for input(s): CKTOTAL, CKMB, CKMBINDEX, TROPONINI in the last 168 hours.  BNP: Invalid input(s): POCBNP  CBG: Recent Labs  Lab 03/11/24 1255 03/14/24 2032  GLUCAP 232* 324*    Microbiology: Results for orders placed or performed during the hospital encounter of 07/21/23  MRSA Next Gen by PCR, Nasal     Status: None   Collection Time: 07/22/23  2:25 AM   Specimen: Nasal Mucosa; Nasal Swab  Result Value Ref Range Status   MRSA by PCR Next Gen NOT DETECTED NOT DETECTED Final    Comment: (NOTE) The GeneXpert MRSA Assay (FDA approved for NASAL specimens only), is one component of a comprehensive MRSA colonization surveillance program. It is not intended to diagnose MRSA infection nor to guide or monitor treatment for MRSA infections. Test performance is not FDA approved in patients less than 58 years old. Performed at [facility], [address] [place], [place] [address]   Culture, blood (Routine X 2) w Reflex to ID Panel     Status: None   Collection Time: 07/27/23  6:52 PM   Specimen: BLOOD  Result Value Ref Range Status   Specimen Description BLOOD BLOOD LEFT ARM  Final   Special Requests   Final  AEROBIC BOTTLE ONLY Blood Culture results may not be optimal due to an inadequate volume of blood received in culture bottles   Culture   Final    NO GROWTH 5 DAYS Performed at [facility], [address], [place], [place] [address]    Report Status 08/01/2023 FINAL  Final  Culture, blood (Routine X 2) w Reflex to ID Panel     Status: None   Collection Time: 07/27/23  6:54 PM   Specimen: BLOOD  Result Value Ref Range Status   Specimen Description BLOOD BLOOD LEFT HAND  Final   Special Requests   Final    AEROBIC BOTTLE ONLY Blood Culture results may not be optimal due to an inadequate volume of blood received in culture bottles   Culture   Final    NO GROWTH 5 DAYS Performed at [facility], [address] [place], [place] [address]    Report Status 08/01/2023 FINAL  Final    Coagulation Studies: No results for input(s): LABPROT, INR in the last 72 hours.  Urinalysis: No results for input(s): COLORURINE, LABSPEC, PHURINE, GLUCOSEU, HGBUR, BILIRUBINUR, KETONESUR, PROTEINUR, UROBILINOGEN, NITRITE, LEUKOCYTESUR in the last 72 hours.  Invalid input(s): APPERANCEUR    Imaging: US  RENAL Result Date: 03/15/2024 CLINICAL DATA:  Acute kidney failure. EXAM: RENAL / URINARY TRACT ULTRASOUND COMPLETE COMPARISON:  None Available. FINDINGS: Right Kidney: Renal measurements: 8.7 cm x 4.7 cm x 4.8 cm = volume: 103.0 mL. Echogenicity within normal limits. A 3.5 cm x 3.2 cm x 3.0 cm simple cyst is seen within the right kidney. No hydronephrosis is visualized. Left Kidney: Renal measurements: 8.3 cm x 4.9 cm x 4.7 cm = volume: 99.1 mL. Echogenicity within normal limits. No mass or hydronephrosis visualized. Bladder: Appears normal for degree of bladder distention. Other: None. IMPRESSION: Simple right renal cyst. Electronically Signed   By: Suzen Dials M.D.   On: 03/15/2024 18:46     Medications:     sodium chloride    Intravenous Once   vitamin C   500 mg Oral BID   aspirin   81 mg Oral Daily   atorvastatin   40 mg Oral QHS   carbamazepine   200 mg Oral TID   clopidogrel   75 mg Oral Q breakfast   diphenhydrAMINE   25 mg Oral QHS   DULoxetine   60 mg Oral Daily   gabapentin   300 mg Oral QHS   iron  polysaccharides  150 mg Oral Daily   melatonin  5 mg Oral QHS   metFORMIN   500 mg Oral Q breakfast   modafinil   100 mg Oral Daily   multivitamin with minerals  1 tablet Oral Daily   nutrition supplement (JUVEN)  1 packet Oral BID BM   polyethylene glycol  34 g Oral Daily   sodium zirconium cyclosilicate   10 g Oral BID   tamsulosin   0.4 mg Oral QPC breakfast   zinc  sulfate (50mg  elemental zinc )  220 mg Oral Daily   acetaminophen ,  ipratropium-albuterol , nitroGLYCERIN , ondansetron  (ZOFRAN ) IV, sodium chloride  flush  Assessment/ Plan:  Mr. FREDICK SCHLOSSER is a 84 y.o.  male with  past medical history of hypertension, BPH, diabetes, traumatic subdural s/p craniotomy, and chronic kidney disease stage IIIa, who was admitted to [facility] on 03/02/2024 for Troponin level elevated [R79.89] Chest pain, moderate coronary artery risk [R07.9] Chest pain [R07.9]   Hyperkalemia, potassium 5.3-5.8.  Secondary to diet versus retention.  Renal ultrasound negative for hydronephrosis, simple cysts present. Potassium remains 5.1. Continue twice  daily Lokelma  as ordered.   Hypertension, essential.  Home regimen includes doxazosin  and propranolol , both currently held. Blood pressure elevated this morning.   3. Chronic kidney disease stage IIIa, baseline creatinine 1.35 with GFR 52 back in August 2025.  Creatinine has peaked to 1.88 during this admission however has returned to baseline.   Remains at baseline  Lab Results  Component Value Date   CREATININE 1.31 (H) 03/17/2024   CREATININE 1.33 (H) 03/16/2024   CREATININE 1.33 (H) 03/15/2024    Intake/Output Summary (Last 24 hours) at 03/17/2024 1154 Last data filed at 03/17/2024 0700 Gross per 24 hour  Intake 572.5 ml  Output 1700 ml  Net -1127.5 ml       LOS: 13 Sharisa Toves 1/21/202611:54 AM   "

## 2024-03-18 MED ORDER — SODIUM BICARBONATE 650 MG PO TABS: 1300.0000 mg | ORAL_TABLET | Freq: Two times a day (BID) | ORAL | Status: AC

## 2024-03-19 LAB — VITAMIN A: Vitamin A (Retinoic Acid): 33.3 ug/dL (ref 22.0–69.5)

## 2024-04-12 ENCOUNTER — Ambulatory Visit: Admitting: Cardiovascular Disease

## 2024-05-19 ENCOUNTER — Ambulatory Visit: Admitting: Neurology
